# Patient Record
Sex: Female | Born: 1935 | Race: White | Hispanic: No | Smoking: Never smoker
Health system: Southern US, Community
[De-identification: ages and names within clinical notes are randomized; demographics above are authoritative.]

## PROBLEM LIST (undated history)

## (undated) DIAGNOSIS — I4891 Unspecified atrial fibrillation: Secondary | ICD-10-CM

## (undated) DIAGNOSIS — I10 Essential (primary) hypertension: Secondary | ICD-10-CM

## (undated) DIAGNOSIS — F32A Depression, unspecified: Secondary | ICD-10-CM

## (undated) DIAGNOSIS — E039 Hypothyroidism, unspecified: Secondary | ICD-10-CM

## (undated) DIAGNOSIS — E785 Hyperlipidemia, unspecified: Secondary | ICD-10-CM

## (undated) DIAGNOSIS — I509 Heart failure, unspecified: Secondary | ICD-10-CM

## (undated) DIAGNOSIS — F329 Major depressive disorder, single episode, unspecified: Secondary | ICD-10-CM

## (undated) DIAGNOSIS — M1990 Unspecified osteoarthritis, unspecified site: Secondary | ICD-10-CM

## (undated) DIAGNOSIS — H539 Unspecified visual disturbance: Secondary | ICD-10-CM

## (undated) DIAGNOSIS — M545 Low back pain, unspecified: Secondary | ICD-10-CM

## (undated) DIAGNOSIS — B029 Zoster without complications: Secondary | ICD-10-CM

## (undated) DIAGNOSIS — H409 Unspecified glaucoma: Secondary | ICD-10-CM

## (undated) DIAGNOSIS — H3530 Unspecified macular degeneration: Secondary | ICD-10-CM

## (undated) HISTORY — PX: CATARACT EXTRACTION BILATERAL W/ ANTERIOR VITRECTOMY: SHX1304

## (undated) HISTORY — PX: HX TONSILLECTOMY: SHX27

## (undated) HISTORY — PX: HIP ARTHROPLASTY: SHX981

## (undated) HISTORY — PX: HX APPENDECTOMY: SHX54

## (undated) HISTORY — PX: HX TUBAL LIGATION: SHX77

## (undated) HISTORY — PX: HIP SURGERY: SHX245

## (undated) HISTORY — DX: Low back pain, unspecified: M54.50

## (undated) HISTORY — DX: Unspecified visual disturbance: H53.9

## (undated) HISTORY — DX: Hyperlipidemia, unspecified: E78.5

## (undated) HISTORY — DX: Unspecified atrial fibrillation: I48.91

## (undated) HISTORY — DX: Zoster without complications: B02.9

## (undated) HISTORY — PX: TONSILLECTOMY: SUR1361

## (undated) HISTORY — DX: Essential (primary) hypertension: I10

## (undated) HISTORY — PX: APPENDECTOMY (OPEN): SHX54

## (undated) HISTORY — DX: Unspecified macular degeneration: H35.30

## (undated) HISTORY — DX: Unspecified osteoarthritis, unspecified site: M19.90

## (undated) HISTORY — DX: Heart failure, unspecified: I50.9

## (undated) HISTORY — PX: EYE SURGERY: SHX253

## (undated) HISTORY — DX: Unspecified glaucoma: H40.9

## (undated) HISTORY — PX: TUBAL LIGATION: SHX77

---
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
## 1997-11-28 ENCOUNTER — Ambulatory Visit: Admit: 1997-11-28 | Disposition: A | Discharge status: Home / Self Care | Payer: Self-pay | Source: Ambulatory Visit

## 1998-03-04 ENCOUNTER — Ambulatory Visit: Admit: 1998-03-04 | Disposition: A | Discharge status: Home / Self Care | Payer: Self-pay | Source: Ambulatory Visit

## 1998-07-31 ENCOUNTER — Ambulatory Visit
Admission: RE | Admit: 1998-07-31 | Disposition: A | Discharge status: Home / Self Care | Payer: Self-pay | Source: Ambulatory Visit

## 1998-08-12 ENCOUNTER — Ambulatory Visit: Admit: 1998-08-12 | Disposition: A | Discharge status: Home / Self Care | Payer: Self-pay | Source: Ambulatory Visit

## 1998-08-21 ENCOUNTER — Ambulatory Visit
Admission: RE | Admit: 1998-08-21 | Disposition: A | Discharge status: Home / Self Care | Payer: Self-pay | Source: Ambulatory Visit

## 1998-11-29 ENCOUNTER — Ambulatory Visit: Admit: 1998-11-29 | Disposition: A | Discharge status: Home / Self Care | Payer: Self-pay | Source: Ambulatory Visit

## 1998-12-20 ENCOUNTER — Ambulatory Visit: Admit: 1998-12-20 | Disposition: A | Discharge status: Home / Self Care | Payer: Self-pay | Source: Ambulatory Visit

## 1999-03-21 ENCOUNTER — Ambulatory Visit
Admission: RE | Admit: 1999-03-21 | Disposition: A | Discharge status: Home / Self Care | Payer: Self-pay | Source: Ambulatory Visit

## 1999-03-24 ENCOUNTER — Ambulatory Visit
Admission: RE | Admit: 1999-03-24 | Disposition: A | Discharge status: Home / Self Care | Payer: Self-pay | Source: Ambulatory Visit

## 1999-07-02 ENCOUNTER — Ambulatory Visit
Admission: RE | Admit: 1999-07-02 | Disposition: A | Discharge status: Home / Self Care | Payer: Self-pay | Source: Ambulatory Visit

## 2000-02-17 ENCOUNTER — Ambulatory Visit: Admit: 2000-02-17 | Disposition: A | Discharge status: Home / Self Care | Payer: Self-pay | Source: Ambulatory Visit

## 2000-03-02 ENCOUNTER — Ambulatory Visit: Admit: 2000-03-02 | Disposition: A | Discharge status: Home / Self Care | Payer: Self-pay | Source: Ambulatory Visit

## 2000-05-11 ENCOUNTER — Ambulatory Visit
Admission: RE | Admit: 2000-05-11 | Disposition: A | Discharge status: Home / Self Care | Payer: Self-pay | Source: Ambulatory Visit

## 2000-12-31 ENCOUNTER — Ambulatory Visit
Admission: RE | Admit: 2000-12-31 | Disposition: A | Discharge status: Home / Self Care | Payer: Self-pay | Source: Ambulatory Visit

## 2001-03-24 ENCOUNTER — Ambulatory Visit
Admission: RE | Admit: 2001-03-24 | Disposition: A | Discharge status: Home / Self Care | Payer: Self-pay | Source: Ambulatory Visit

## 2001-05-10 ENCOUNTER — Ambulatory Visit
Admission: RE | Admit: 2001-05-10 | Disposition: A | Discharge status: Home / Self Care | Payer: Self-pay | Source: Ambulatory Visit

## 2002-07-03 ENCOUNTER — Ambulatory Visit
Admission: RE | Admit: 2002-07-03 | Disposition: A | Discharge status: Home / Self Care | Payer: Self-pay | Source: Ambulatory Visit

## 2002-08-03 ENCOUNTER — Ambulatory Visit
Admission: RE | Admit: 2002-08-03 | Disposition: A | Discharge status: Home / Self Care | Payer: Self-pay | Source: Ambulatory Visit

## 2002-08-07 ENCOUNTER — Ambulatory Visit: Admit: 2002-08-07 | Disposition: A | Discharge status: Home / Self Care | Payer: Self-pay | Source: Ambulatory Visit

## 2002-08-07 ENCOUNTER — Ambulatory Visit
Admission: RE | Admit: 2002-08-07 | Disposition: A | Discharge status: Home / Self Care | Payer: Self-pay | Source: Ambulatory Visit

## 2003-04-20 ENCOUNTER — Emergency Department
Admission: EM | Admit: 2003-04-20 | Disposition: A | Discharge status: Home / Self Care | Payer: Self-pay | Source: Ambulatory Visit

## 2003-07-06 ENCOUNTER — Ambulatory Visit
Admission: RE | Admit: 2003-07-06 | Disposition: A | Discharge status: Home / Self Care | Payer: Self-pay | Source: Ambulatory Visit

## 2004-06-20 ENCOUNTER — Ambulatory Visit: Admit: 2004-06-20 | Disposition: A | Discharge status: Home / Self Care | Payer: Self-pay | Source: Ambulatory Visit

## 2004-09-04 ENCOUNTER — Emergency Department
Admission: EM | Admit: 2004-09-04 | Disposition: A | Discharge status: Home / Self Care | Payer: Self-pay | Source: Ambulatory Visit

## 2004-10-10 ENCOUNTER — Ambulatory Visit
Admission: RE | Admit: 2004-10-10 | Disposition: A | Discharge status: Home / Self Care | Payer: Self-pay | Source: Ambulatory Visit

## 2004-12-17 ENCOUNTER — Ambulatory Visit
Admission: RE | Admit: 2004-12-17 | Disposition: A | Discharge status: Home / Self Care | Payer: Self-pay | Source: Ambulatory Visit

## 2005-02-16 ENCOUNTER — Emergency Department
Admission: EM | Admit: 2005-02-16 | Disposition: A | Discharge status: Home / Self Care | Payer: Self-pay | Source: Ambulatory Visit

## 2005-02-25 ENCOUNTER — Ambulatory Visit
Admission: RE | Admit: 2005-02-25 | Disposition: A | Discharge status: Home / Self Care | Payer: Self-pay | Source: Ambulatory Visit

## 2005-05-16 ENCOUNTER — Ambulatory Visit
Admission: RE | Admit: 2005-05-16 | Disposition: A | Discharge status: Home / Self Care | Payer: Self-pay | Source: Ambulatory Visit

## 2005-09-22 ENCOUNTER — Ambulatory Visit
Admission: RE | Admit: 2005-09-22 | Disposition: A | Discharge status: Home / Self Care | Payer: Self-pay | Source: Ambulatory Visit

## 2005-11-09 ENCOUNTER — Ambulatory Visit
Admission: RE | Admit: 2005-11-09 | Disposition: A | Discharge status: Home / Self Care | Payer: Self-pay | Source: Ambulatory Visit

## 2006-02-23 ENCOUNTER — Ambulatory Visit
Admission: RE | Admit: 2006-02-23 | Disposition: A | Discharge status: Home / Self Care | Payer: Self-pay | Source: Ambulatory Visit

## 2006-12-06 ENCOUNTER — Ambulatory Visit
Admission: RE | Admit: 2006-12-06 | Disposition: A | Discharge status: Home / Self Care | Payer: Self-pay | Source: Ambulatory Visit

## 2007-04-06 ENCOUNTER — Ambulatory Visit
Admission: RE | Admit: 2007-04-06 | Disposition: A | Discharge status: Home / Self Care | Payer: Self-pay | Source: Ambulatory Visit

## 2007-04-25 ENCOUNTER — Ambulatory Visit
Admission: RE | Admit: 2007-04-25 | Disposition: A | Discharge status: Home / Self Care | Payer: Self-pay | Source: Ambulatory Visit

## 2007-05-23 ENCOUNTER — Ambulatory Visit
Admission: RE | Admit: 2007-05-23 | Disposition: A | Discharge status: Home / Self Care | Payer: Self-pay | Source: Ambulatory Visit

## 2007-05-24 ENCOUNTER — Ambulatory Visit
Admission: RE | Admit: 2007-05-24 | Disposition: A | Discharge status: Home / Self Care | Payer: Self-pay | Source: Ambulatory Visit

## 2007-06-24 ENCOUNTER — Ambulatory Visit
Admission: RE | Admit: 2007-06-24 | Disposition: A | Discharge status: Home / Self Care | Payer: Self-pay | Source: Ambulatory Visit

## 2007-07-14 ENCOUNTER — Ambulatory Visit (INDEPENDENT_AMBULATORY_CARE_PROVIDER_SITE_OTHER): Admit: 2007-07-14 | Disposition: A | Discharge status: Home / Self Care | Payer: Self-pay | Source: Ambulatory Visit

## 2007-07-14 ENCOUNTER — Ambulatory Visit: Admit: 2007-07-14 | Disposition: A | Discharge status: Home / Self Care | Payer: Self-pay | Source: Ambulatory Visit

## 2008-01-26 ENCOUNTER — Ambulatory Visit
Admission: RE | Admit: 2008-01-26 | Disposition: A | Discharge status: Home / Self Care | Payer: Self-pay | Source: Ambulatory Visit

## 2008-07-20 ENCOUNTER — Ambulatory Visit
Admission: RE | Admit: 2008-07-20 | Disposition: A | Discharge status: Home / Self Care | Payer: Self-pay | Source: Ambulatory Visit

## 2008-08-17 ENCOUNTER — Ambulatory Visit
Admission: RE | Admit: 2008-08-17 | Disposition: A | Discharge status: Home / Self Care | Payer: Self-pay | Source: Ambulatory Visit

## 2008-09-19 ENCOUNTER — Ambulatory Visit
Admission: RE | Admit: 2008-09-19 | Disposition: A | Discharge status: Home / Self Care | Payer: Self-pay | Source: Ambulatory Visit

## 2008-10-26 ENCOUNTER — Ambulatory Visit
Admission: RE | Admit: 2008-10-26 | Disposition: A | Discharge status: Home / Self Care | Payer: Self-pay | Source: Ambulatory Visit

## 2008-11-23 DIAGNOSIS — S72001A Fracture of unspecified part of neck of right femur, initial encounter for closed fracture: Secondary | ICD-10-CM

## 2008-11-23 HISTORY — DX: Fracture of unspecified part of neck of right femur, initial encounter for closed fracture: S72.001A

## 2009-01-20 ENCOUNTER — Ambulatory Visit
Admission: RE | Admit: 2009-01-20 | Disposition: A | Discharge status: Home / Self Care | Payer: Self-pay | Source: Ambulatory Visit

## 2009-01-31 ENCOUNTER — Ambulatory Visit
Admission: RE | Admit: 2009-01-31 | Disposition: A | Discharge status: Home / Self Care | Payer: Self-pay | Source: Ambulatory Visit

## 2009-02-21 ENCOUNTER — Ambulatory Visit
Admission: RE | Admit: 2009-02-21 | Disposition: A | Discharge status: Home / Self Care | Payer: Self-pay | Source: Ambulatory Visit

## 2009-03-31 ENCOUNTER — Inpatient Hospital Stay
Admission: EM | Admit: 2009-03-31 | Disposition: A | Discharge status: Skilled Nursing Facility | Payer: Self-pay | Source: Ambulatory Visit | Admitting: Orthopaedic Surgery

## 2009-06-18 ENCOUNTER — Ambulatory Visit
Admission: RE | Admit: 2009-06-18 | Disposition: A | Discharge status: Home / Self Care | Payer: Self-pay | Source: Ambulatory Visit

## 2009-06-24 ENCOUNTER — Ambulatory Visit
Admission: RE | Admit: 2009-06-24 | Disposition: A | Discharge status: Home / Self Care | Payer: Self-pay | Source: Ambulatory Visit

## 2009-07-24 ENCOUNTER — Ambulatory Visit
Admission: RE | Admit: 2009-07-24 | Disposition: A | Discharge status: Home / Self Care | Payer: Self-pay | Source: Ambulatory Visit

## 2009-10-24 ENCOUNTER — Ambulatory Visit
Admission: RE | Admit: 2009-10-24 | Disposition: A | Discharge status: Home / Self Care | Payer: Self-pay | Source: Ambulatory Visit

## 2009-11-12 ENCOUNTER — Ambulatory Visit
Admission: RE | Admit: 2009-11-12 | Disposition: A | Discharge status: Home / Self Care | Payer: Self-pay | Source: Ambulatory Visit

## 2009-12-05 ENCOUNTER — Ambulatory Visit
Admission: RE | Admit: 2009-12-05 | Disposition: A | Discharge status: Home / Self Care | Payer: Self-pay | Source: Ambulatory Visit

## 2009-12-24 ENCOUNTER — Ambulatory Visit
Admission: RE | Admit: 2009-12-24 | Disposition: A | Discharge status: Home / Self Care | Payer: Self-pay | Source: Ambulatory Visit

## 2010-03-13 ENCOUNTER — Ambulatory Visit
Admission: RE | Admit: 2010-03-13 | Disposition: A | Discharge status: Home / Self Care | Payer: Self-pay | Source: Ambulatory Visit

## 2011-03-19 ENCOUNTER — Ambulatory Visit
Admission: RE | Admit: 2011-03-19 | Disposition: A | Discharge status: Home / Self Care | Payer: Self-pay | Source: Ambulatory Visit

## 2011-05-11 ENCOUNTER — Ambulatory Visit
Admission: RE | Admit: 2011-05-11 | Disposition: A | Discharge status: Home / Self Care | Payer: Self-pay | Source: Ambulatory Visit

## 2012-04-05 ENCOUNTER — Ambulatory Visit
Admission: RE | Admit: 2012-04-05 | Disposition: A | Discharge status: Home / Self Care | Payer: Self-pay | Source: Ambulatory Visit

## 2012-09-20 ENCOUNTER — Emergency Department
Admission: EM | Admit: 2012-09-20 | Disposition: A | Discharge status: Home / Self Care | Payer: Self-pay | Source: Ambulatory Visit

## 2012-10-25 ENCOUNTER — Ambulatory Visit
Admission: RE | Admit: 2012-10-25 | Disposition: A | Discharge status: Home / Self Care | Payer: Self-pay | Source: Ambulatory Visit

## 2012-10-26 ENCOUNTER — Ambulatory Visit
Admission: RE | Admit: 2012-10-26 | Disposition: A | Discharge status: Home / Self Care | Payer: Self-pay | Source: Ambulatory Visit

## 2012-10-27 ENCOUNTER — Ambulatory Visit
Admission: RE | Admit: 2012-10-27 | Disposition: A | Discharge status: Home / Self Care | Payer: Self-pay | Source: Ambulatory Visit

## 2012-11-15 ENCOUNTER — Emergency Department
Admission: EM | Admit: 2012-11-15 | Disposition: A | Discharge status: Home / Self Care | Payer: Self-pay | Source: Ambulatory Visit

## 2012-12-28 LAB — CBC AND DIFFERENTIAL
Basophils %: 0.6 % (ref 0.0–3.0)
Basophils Absolute: 0 10*3/uL (ref 0.0–0.3)
Eosinophils %: 1.5 % (ref 0.0–7.0)
Eosinophils Absolute: 0.1 10*3/uL (ref 0.0–0.8)
Hematocrit: 36.4 % (ref 36.0–48.0)
Hemoglobin: 12 gm/dL (ref 12.0–16.0)
Lymphocytes Absolute: 1.7 10*3/uL (ref 0.6–5.1)
Lymphocytes: 24.2 % (ref 15.0–46.0)
MCH: 32 pg (ref 28–35)
MCHC: 33 gm/dL (ref 32–36)
MCV: 97 fL (ref 80–100)
MPV: 7.4 fL (ref 6.0–10.0)
Monocytes Absolute: 0.7 10*3/uL (ref 0.1–1.7)
Monocytes: 9.9 % (ref 3.0–15.0)
Neutrophils %: 63.9 % (ref 42.0–78.0)
Neutrophils Absolute: 4.4 10*3/uL (ref 1.7–8.6)
PLT CT: 192 10*3/uL (ref 130–440)
RBC: 3.75 10*6/uL — ABNORMAL LOW (ref 3.80–5.00)
RDW: 16.1 % — ABNORMAL HIGH (ref 11.0–14.0)
WBC: 6.9 10*3/uL (ref 4.0–11.0)

## 2012-12-28 LAB — I-STAT CHEM 8 CARTRIDGE
Anion Gap I-Stat: 16 (ref 7–16)
BUN I-Stat: 22 mg/dL — ABNORMAL HIGH (ref 6–20)
Calcium Ionized I-Stat: 5.2 mg/dL — ABNORMAL HIGH (ref 4.35–5.10)
Chloride I-Stat: 102 mMol/L (ref 98–112)
Creatinine I-Stat: 1.3 mg/dL — ABNORMAL HIGH (ref 0.60–1.10)
EGFR: 38 mL/min/{1.73_m2}
Glucose I-Stat: 96 mg/dL (ref 70–99)
Hematocrit I-Stat: 32 % — ABNORMAL LOW (ref 36.0–48.0)
Hemoglobin I-Stat: 10.9 gm/dL — ABNORMAL LOW (ref 12.0–16.0)
Potassium I-Stat: 3.9 mMol/L (ref 3.5–5.3)
Sodium I-Stat: 141 mMol/L (ref 135–145)
TCO2 I-Stat: 28 mMol/L (ref 24–29)

## 2012-12-28 LAB — MAGNESIUM: Magnesium: 2.1 mg/dL (ref 1.6–2.6)

## 2012-12-30 LAB — CBC AND DIFFERENTIAL
Basophils %: 0.2 % (ref 0.0–3.0)
Basophils Absolute: 0 10*3/uL (ref 0.0–0.3)
Eosinophils %: 1.8 % (ref 0.0–7.0)
Eosinophils Absolute: 0.1 10*3/uL (ref 0.0–0.8)
Hematocrit: 36.4 % (ref 36.0–48.0)
Hemoglobin: 12.4 gm/dL (ref 12.0–16.0)
Lymphocytes Absolute: 1.1 10*3/uL (ref 0.6–5.1)
Lymphocytes: 20.5 % (ref 15.0–46.0)
MCH: 33 pg (ref 28–35)
MCHC: 34 gm/dL (ref 32–36)
MCV: 97 fL (ref 80–100)
MPV: 7.2 fL (ref 6.0–10.0)
Monocytes Absolute: 0.6 10*3/uL (ref 0.1–1.7)
Monocytes: 10.5 % (ref 3.0–15.0)
Neutrophils %: 67 % (ref 42.0–78.0)
Neutrophils Absolute: 3.5 10*3/uL (ref 1.7–8.6)
PLT CT: 194 10*3/uL (ref 130–440)
RBC: 3.77 10*6/uL — ABNORMAL LOW (ref 3.80–5.00)
RDW: 16.1 % — ABNORMAL HIGH (ref 11.0–14.0)
WBC: 5.3 10*3/uL (ref 4.0–11.0)

## 2012-12-30 LAB — BASIC METABOLIC PANEL
Anion Gap: 11.1 mMol/L (ref 7.0–18.0)
BUN / Creatinine Ratio: 16.2 Ratio (ref 10.0–30.0)
BUN: 17 mg/dL (ref 7–22)
CO2: 30.5 mMol/L — ABNORMAL HIGH (ref 20.0–30.0)
Calcium: 10.2 mg/dL (ref 8.5–10.5)
Chloride: 103 mMol/L (ref 98–110)
Creatinine: 1.05 mg/dL (ref 0.60–1.20)
EGFR: 49 mL/min/{1.73_m2}
Glucose: 128 mg/dL — ABNORMAL HIGH (ref 70–99)
Osmolality Calc: 284 mOsm/kg (ref 275–300)
Potassium: 3.6 mMol/L (ref 3.5–5.3)
Sodium: 141 mMol/L (ref 136–147)

## 2013-02-14 ENCOUNTER — Ambulatory Visit
Admit: 2013-02-14 | Disposition: A | Discharge status: Home / Self Care | Payer: Self-pay | Source: Ambulatory Visit | Admitting: Orthopaedic Surgery

## 2013-04-04 LAB — ECG 12-LEAD
P Wave Axis: 61 deg
P Wave Axis: 62 deg
P Wave Duration: 102 ms
P Wave Duration: 106 ms
P-R Interval: 152 ms
P-R Interval: 158 ms
Patient Age: 89 years
Patient Age: 89 years
Q-T Dispersion: 52 ms
Q-T Dispersion: 56 ms
Q-T Interval(Corrected): 407 ms
Q-T Interval(Corrected): 418 ms
Q-T Interval: 338 ms
Q-T Interval: 338 ms
QRS Axis: 25 deg
QRS Axis: 31 deg
QRS Duration: 76 ms
QRS Duration: 78 ms
T Axis: 48 deg
T Axis: 51 deg
Ventricular Rate: 87 /min
Ventricular Rate: 92 /min

## 2013-04-15 NOTE — Procedures (Signed)
[facility]                                 RESPIRATORY SERVICES       PULMONARY FUNCTION REPORT              NAME: Kelli Brown, Kelli Brown                                  ROOM#: 1/--WD       DOB:  21-Jun-1919                                            AGE/SEX: 89/F       MR#: [id number]       ACCT#: [id number]                                                 INT PHYS:       TEST DATE: 10/26/2012                        REQ PHYS: Vaughan Basta, MD                                                                                              ORDER#: [id number]                                            WEIGHT: 159.00       HGT: 60.00                                                RACE: Caucasian       TECH: Jack Quarto                               SMOKING HX: Never smoked       _________________________________________________________________________              DATE OF PROCEDURE:       10/26/2012              REQUESTING PHYSICIAN:       Vaughan Basta, MD              HEIGHT:       60.00              WEIGHT:       159.00  TECHNICIAN:       Sonya Liedke              RACE:       Caucasian              SMOKING HX:       Never smoked              DIAGNOSIS:       SOB, R/O PE.              Forced vital capacity of 2.04 L, 127% of predicted.  The FEV1 is 1.54 L,       133% of predicted.  The FEV1% is 75.  The FEF 25-85 1.14 L/sec which is       199% of predicted.  After the application of inhaled bronchodilators,       there is statistically significant improvement in the FEF 25-75 alone.              Lung volumes reveal elevated total lung capacity of 94% of predicted.       Residual volume 88% of predicted.  Diffusion capacity is 60% of       predicted.  When corrected for alveolar volume does improve to 79% of       predicted.  Airway resistance is within normal limits.  Flow volume loop       shows only a minimal amount of obstructed change.              ASSESSMENT:        The patient's spirometry is normal.  There is improvement in the FEF       25-75 which would be consistent with a component of reversible small       airways obstruction.  The lung volumes are normal.  The diffusion       capacity is markedly reduced.  This reduction is out of proportion to       the above noted spirometric changes.  Need to consider the possibility       of pulmonary vascular disease, pulmonary embolic disease, chemically       induced diffusion abnormalities and other.  No previous pulmonary              function studies available for comparison at this time.                                            **PRELIMINARY REPORT UNLESS SIGNED**                                        SEE DOCUMENT IMAGING SYSTEM FOR FINAL REPORT                                                                        ________________________________  Fayrene Helper, MD                                                                                                                         ID:   [id number]                                                                  DocType:   [facility]       \:   JC                                                                         /:   [id number]       DD:  11/01/2012 13:02:17                                                        DT:  11/02/2012 08:09:35                                                        JOB: [id number]                                                                    CC:  Reginia Naas, MD([id number])            EDWARD P SMITH ([id number])                   >                                                                          Authenticated by Fayrene Helper., MD On 11/11/2012 10:57:51 AM

## 2013-04-16 NOTE — Procedures (Signed)
[facility]                               CARDIOVASCULAR LABORATORY                                 ECHOCARDIOGRAM REPORT       Echocardiogram.              NAME:Kelli Brown, Kelli Brown                                    ROOM#:E/--IS              DOB:25-Jan-1919                                              AGE/SEX: 89/F       ZO#:[id number]       [id number]       _________________________________________________________________________              TEST DATE: 09/20/2012                  REQ PHYS: Edrick Kins, MD                                                                                              ORDER#:                                   INT PHYS: Celene Kras, MD       HGT: 61 inches                                                WEIGHT: 158       TECH: --                                          BP: 134/66                                                                                         _________________________________________________________________________       DATE OF PROCEDURE:       09/20/2012  REQUESTING PHYSICIAN:       Edrick Kins, MD              INT PHYSICIAN:       Celene Kras, MD              HEIGHT:       61 inches              WEIGHT:       158              BLOOD PRESSURE:       134/66              TECHNICIAN:       --              TYPE OF REPORT:       Echocardiogram.              TIME OF TEST:       2:17 p.m.              INDICATION:       Shortness of breath.              INTERPRETATION:       2-D and M-mode measurements were performed and reveal the following:              RVID (ed):  --  (2.1-3.2) LVPW (es):   1.2  (0.9-2.1)        IVS (ed):   1.1 (0.7-1.1) Aorta (ed):  2.8  (2.2-3.7)        LVID (ed):  3.7 (4.0-5.6) LVOT:        --   (2.0-4.0)        LVPW (ed):  1.0 (0.7-1.1) LA (es):     3.5  (2.5-4.0)        IVS (es):   1.5 (0.8-2.0) EF:          57.7 (55-77%)                LVID (es):  2.6  (2.0-3.8) EPSS:        --   (2-7 mm)                       DOPPLER/COLOR FLOW:       MITRAL VALVE:  ACTUAL NORMAL    AORTIC VALVE    ACTUAL NORMAL           V Max M/sec:   --     (0.6-1.3) V Max M/sec:    --     (1.0-1.7)        T 1/2 M/sec:   --     (30-60)   LVOT Vel:       --     (0.7-1.1)        Valve Area:    --     (4-6 cm)  Valve area:     --     (3-5 cm)         Regurg:        --               Regurg:         --                      PISA:          --  Pres 1/2 Time:  --                      ERO:           --               Ao Max PG:      4.8                     RV:            --               Ao Mean PG:     --                                    TRICUSPID VALVE ACTUAL NORMAL    PULMONIC VALVE ACTUAL NORMAL           V Max M/sec:    --     (0.3-0.7) V Max M/sec:   --     (0.6-0.9)        Regurg:         --               Regurg         --                      RV Systolic:    --     (15-30)                                          Pressure:       --                                                                                  Technical quality is good.  Rhythm is normal sinus.              2D/M-mode:              1.  LV:  LV is normal in size and function.  Ejection fraction of           60% to 65% with no regional wall motion abnormalities.       2.  Left atrium:  Normal size.       3.  Right ventricle:  Normal size and systolic function.       4.  Right atrium:  Normal size.       5.  Aortic root:  Normal size.       6.  Pericardium:  Trivial posterior pericardial effusion noted.              Valvular assessment/Doppler:              1.  Aortic valve:  Trileaflet, moderately sclerotic with trace AI           and normal outflow velocities.       2.  Mitral  valve:  Mild mitral calcification and mild leaflet           thickening with trace MR.  There is E to A reversal present           consistent with LV relaxation abnormality.       3.  Tricuspid valve:  Morphologically normal with mild TR.   Jet           velocity of 2.6 m/sec for an estimated PASP of 38 mmHg using an RA           pressure of 10 mmHg.       4.  Pulmonic valve:  No PI.              IMPRESSION:       1.  Normal left ventricular size and systolic function with           ejection fraction of 60% to 65% with no regional wall motion           abnormalities.       2.  Aortic sclerosis without stenosis with trace AI.       3.  Mild mitral annular calcification leaflet thickening with           trace MR.       4.  PA reversal present consistent with LV relaxation abnormality.                                            **PRELIMINARY REPORT UNLESS SIGNED**                                        SEE DOCUMENT IMAGING SYSTEM FOR FINAL REPORT                                                                               ________________________________                                                Celene Kras, MD                                                                                                                       ID:   [id number]  DocType:   01TRVC       \:   MH                                                                         /:   313       DD:  09/20/2012 19:27:25                                                        DT:  09/21/2012 03:51:28                                                        JOB: [id number]                                                                    CC:  EDWARD P SMITH ([id number])            DR NO REFERRAL ([id number])                   >                                                                          Authenticated by Celene Kras, MD On 09/21/2012 08:57:37 AM

## 2013-04-16 NOTE — Procedures (Signed)
[facility]                               CARDIOVASCULAR LABORATORY       Event monitor.              NAME: Kelli Brown, Kelli Brown                                  ROOM#: O/--CP              DOB:  1919/10/28                                            AGE/SEX: 89/F       ZO#:[id number]       [id number]       _________________________________________________________________________              TEST DATE: 10/27/2012 to 11/27/2012          REQ PHYS: Vaughan Basta, MD                                                                                              ORDER#: [id number]                              INT PHYS: Orland Jarred, MD       HGT: --                                                        WEIGHT: --       TECH: ______                                BP: --                                                                                         _________________________________________________________________________              DATE OF PROCEDURE:       10/27/2012 to 11/27/2012              REQUESTING PHYSICIAN:       Vaughan Basta, MD              INT PHYSICIAN:  Tressa Maldonado Tery Sanfilippo, MD              HEIGHT:       --              WEIGHT:       --              BLOOD PRESSURE:       --              TECHNICIAN:       ______              TYPE OF REPORT:       Event monitor.              TIME OF TEST:       --              INDICATION:       --              INTERPRETATION:       A total of 12 events were transmitted during the period.  Heart rates       ranged from 88 beats to 122 beats per minute.  All of them showed sinus       rhythm with occasional artifacts.  Patient had episode of shortness of       breath and feeling tired one time on October 27, 2012 and the       transmission at that time was sinus rhythm with a heart rate of 87 to       101 beats per minute.  There are no dysrhythmias.                     IMPRESSION:       Unremarkable event monitor recording.   No arrhythmias during the       patient's symptoms of shortness of breath which happened one time.                                            **PRELIMINARY REPORT UNLESS SIGNED**                                        SEE DOCUMENT IMAGING SYSTEM FOR FINAL REPORT                                                                        ________________________________                                                Orland Jarred, MD  ID:   [id number]                                                                  DocType:   01TRVC       \:   VH                                                                         /:   [id number]       DD:  12/01/2012 15:00:50                                                        DT:  12/01/2012 15:24:35                                                        JOB: [id number]                                                                    CC:  Reginia Naas, MD([id number])            EDWARD P SMITH ([id number])            DR NO REFERRAL (1000)                   >                                                                          Authenticated by Herbert Seta ,MD On 12/01/2012 03:42:52 PM

## 2013-05-08 ENCOUNTER — Other Ambulatory Visit: Payer: Self-pay | Admitting: Physician Assistant

## 2013-05-08 ENCOUNTER — Ambulatory Visit
Admission: RE | Admit: 2013-05-08 | Discharge: 2013-05-08 | Disposition: A | Discharge status: Home / Self Care | Payer: Medicare Other | Source: Ambulatory Visit | Attending: Physician Assistant | Admitting: Physician Assistant

## 2013-05-08 DIAGNOSIS — M25579 Pain in unspecified ankle and joints of unspecified foot: Secondary | ICD-10-CM | POA: Insufficient documentation

## 2013-05-17 ENCOUNTER — Other Ambulatory Visit: Payer: Self-pay | Admitting: Physician Assistant

## 2013-05-19 ENCOUNTER — Ambulatory Visit
Admission: RE | Admit: 2013-05-19 | Discharge: 2013-05-19 | Disposition: A | Discharge status: Home / Self Care | Payer: Medicare Other | Source: Ambulatory Visit | Attending: Physician Assistant | Admitting: Physician Assistant

## 2013-05-19 ENCOUNTER — Other Ambulatory Visit: Payer: Self-pay | Admitting: Physician Assistant

## 2013-05-19 DIAGNOSIS — L02419 Cutaneous abscess of limb, unspecified: Secondary | ICD-10-CM | POA: Insufficient documentation

## 2013-05-19 DIAGNOSIS — M25572 Pain in left ankle and joints of left foot: Secondary | ICD-10-CM

## 2013-05-19 DIAGNOSIS — M65979 Unspecified synovitis and tenosynovitis, unspecified ankle and foot: Secondary | ICD-10-CM | POA: Insufficient documentation

## 2013-05-19 DIAGNOSIS — M659 Synovitis and tenosynovitis, unspecified: Secondary | ICD-10-CM | POA: Insufficient documentation

## 2013-05-29 ENCOUNTER — Ambulatory Visit
Admission: RE | Admit: 2013-05-29 | Discharge: 2013-05-29 | Disposition: A | Discharge status: Home / Self Care | Payer: Medicare Other | Source: Ambulatory Visit | Attending: Internal Medicine | Admitting: Internal Medicine

## 2013-05-29 DIAGNOSIS — L0390 Cellulitis, unspecified: Secondary | ICD-10-CM | POA: Insufficient documentation

## 2013-05-29 DIAGNOSIS — M25579 Pain in unspecified ankle and joints of unspecified foot: Secondary | ICD-10-CM | POA: Insufficient documentation

## 2013-05-29 DIAGNOSIS — L0291 Cutaneous abscess, unspecified: Secondary | ICD-10-CM | POA: Insufficient documentation

## 2013-05-29 LAB — CBC AND DIFFERENTIAL
Basophils %: 0.2 % (ref 0.0–3.0)
Basophils Absolute: 0 10*3/uL (ref 0.0–0.3)
Eosinophils %: 2 % (ref 0.0–7.0)
Eosinophils Absolute: 0.1 10*3/uL (ref 0.0–0.8)
Hematocrit: 35.1 % — ABNORMAL LOW (ref 36.0–48.0)
Hemoglobin: 12.3 gm/dL (ref 12.0–16.0)
Lymphocytes Absolute: 1.9 10*3/uL (ref 0.6–5.1)
Lymphocytes: 28.9 % (ref 15.0–46.0)
MCH: 33 pg (ref 28–35)
MCHC: 35 gm/dL (ref 32–36)
MCV: 95 fL (ref 80–100)
MPV: 7.1 fL (ref 6.0–10.0)
Monocytes Absolute: 0.7 10*3/uL (ref 0.1–1.7)
Monocytes: 10.8 % (ref 3.0–15.0)
Neutrophils %: 58.1 % (ref 42.0–78.0)
Neutrophils Absolute: 3.9 10*3/uL (ref 1.7–8.6)
PLT CT: 215 10*3/uL (ref 130–440)
RBC: 3.7 10*6/uL — ABNORMAL LOW (ref 3.80–5.00)
RDW: 16.6 % — ABNORMAL HIGH (ref 11.0–14.0)
WBC: 6.7 10*3/uL (ref 4.0–11.0)

## 2013-12-29 LAB — ECG 12-LEAD
P Wave Axis: 66 deg
P Wave Duration: 88 ms
P-R Interval: 174 ms
Patient Age: 89 years
Q-T Dispersion: 34 ms
Q-T Interval(Corrected): 417 ms
Q-T Interval: 370 ms
QRS Axis: 32 deg
QRS Duration: 78 ms
T Axis: 38 deg
Ventricular Rate: 87 /min

## 2014-10-23 ENCOUNTER — Inpatient Hospital Stay: Payer: Medicare Other | Admitting: Neurological Surgery

## 2014-10-23 ENCOUNTER — Inpatient Hospital Stay
Admission: EM | Admit: 2014-10-23 | Discharge: 2014-10-26 | DRG: 552 | Disposition: A | Discharge status: Skilled Nursing Facility | Payer: Medicare Other | Attending: Neurological Surgery | Admitting: Neurological Surgery

## 2014-10-23 ENCOUNTER — Emergency Department: Payer: Medicare Other

## 2014-10-23 DIAGNOSIS — S12000A Unspecified displaced fracture of first cervical vertebra, initial encounter for closed fracture: Principal | ICD-10-CM | POA: Diagnosis present

## 2014-10-23 DIAGNOSIS — H3530 Unspecified macular degeneration: Secondary | ICD-10-CM | POA: Diagnosis present

## 2014-10-23 DIAGNOSIS — Y9389 Activity, other specified: Secondary | ICD-10-CM

## 2014-10-23 DIAGNOSIS — S12100A Unspecified displaced fracture of second cervical vertebra, initial encounter for closed fracture: Secondary | ICD-10-CM | POA: Diagnosis present

## 2014-10-23 DIAGNOSIS — Y998 Other external cause status: Secondary | ICD-10-CM

## 2014-10-23 DIAGNOSIS — S12091A Other nondisplaced fracture of first cervical vertebra, initial encounter for closed fracture: Secondary | ICD-10-CM

## 2014-10-23 DIAGNOSIS — Z9049 Acquired absence of other specified parts of digestive tract: Secondary | ICD-10-CM | POA: Diagnosis present

## 2014-10-23 DIAGNOSIS — W19XXXA Unspecified fall, initial encounter: Secondary | ICD-10-CM | POA: Diagnosis present

## 2014-10-23 DIAGNOSIS — I10 Essential (primary) hypertension: Secondary | ICD-10-CM | POA: Diagnosis present

## 2014-10-23 DIAGNOSIS — Z885 Allergy status to narcotic agent status: Secondary | ICD-10-CM

## 2014-10-23 DIAGNOSIS — Y92009 Unspecified place in unspecified non-institutional (private) residence as the place of occurrence of the external cause: Secondary | ICD-10-CM

## 2014-10-23 DIAGNOSIS — S129XXA Fracture of neck, unspecified, initial encounter: Secondary | ICD-10-CM | POA: Diagnosis present

## 2014-10-23 LAB — CBC AND DIFFERENTIAL
Basophils %: 0.2 % (ref 0.0–3.0)
Basophils Absolute: 0 10*3/uL (ref 0.0–0.3)
Eosinophils %: 0.4 % (ref 0.0–7.0)
Eosinophils Absolute: 0 10*3/uL (ref 0.0–0.8)
Hematocrit: 35.9 % — ABNORMAL LOW (ref 36.0–48.0)
Hemoglobin: 12.2 gm/dL (ref 12.0–16.0)
Lymphocytes Absolute: 0.9 10*3/uL (ref 0.6–5.1)
Lymphocytes: 7.8 % — ABNORMAL LOW (ref 15.0–46.0)
MCH: 33 pg (ref 28–35)
MCHC: 34 gm/dL (ref 32–36)
MCV: 96 fL (ref 80–100)
MPV: 7.7 fL (ref 6.0–10.0)
Monocytes Absolute: 0.5 10*3/uL (ref 0.1–1.7)
Monocytes: 4.5 % (ref 3.0–15.0)
Neutrophils %: 87.1 % — ABNORMAL HIGH (ref 42.0–78.0)
Neutrophils Absolute: 9.9 10*3/uL — ABNORMAL HIGH (ref 1.7–8.6)
PLT CT: 188 10*3/uL (ref 130–440)
RBC: 3.72 10*6/uL — ABNORMAL LOW (ref 3.80–5.00)
RDW: 17 % — ABNORMAL HIGH (ref 11.0–14.0)
WBC: 11.3 10*3/uL — ABNORMAL HIGH (ref 4.0–11.0)

## 2014-10-23 LAB — COMPREHENSIVE METABOLIC PANEL
ALT: 18 U/L (ref 0–55)
AST (SGOT): 27 U/L (ref 10–42)
Albumin/Globulin Ratio: 1.38 Ratio (ref 0.70–1.50)
Albumin: 4.4 gm/dL (ref 3.5–5.0)
Alkaline Phosphatase: 84 U/L (ref 40–145)
Anion Gap: 13 mMol/L (ref 7.0–18.0)
BUN / Creatinine Ratio: 20.2 Ratio (ref 10.0–30.0)
BUN: 23 mg/dL — ABNORMAL HIGH (ref 7–22)
Bilirubin, Total: 1 mg/dL (ref 0.1–1.2)
CO2: 30.8 mMol/L — ABNORMAL HIGH (ref 20.0–30.0)
Calcium: 10.5 mg/dL (ref 8.5–10.5)
Chloride: 101 mMol/L (ref 98–110)
Creatinine: 1.14 mg/dL (ref 0.60–1.20)
EGFR: 44 mL/min/{1.73_m2}
Globulin: 3.2 gm/dL (ref 2.0–4.0)
Glucose: 131 mg/dL — ABNORMAL HIGH (ref 70–99)
Osmolality Calc: 287 mOsm/kg (ref 275–300)
Potassium: 3.8 mMol/L (ref 3.5–5.3)
Protein, Total: 7.6 gm/dL (ref 6.0–8.3)
Sodium: 141 mMol/L (ref 136–147)

## 2014-10-23 LAB — PT AND APTT
PT INR: 1 (ref 0.5–1.3)
PT: 10.7 s (ref 9.5–11.5)
aPTT: 21.3 s — ABNORMAL LOW (ref 24.0–34.0)

## 2014-10-23 MED ORDER — FUROSEMIDE 40 MG PO TABS
40.0000 mg | ORAL_TABLET | Freq: Every day | ORAL | Status: DC
Start: 2014-10-24 — End: 2014-10-26
  Administered 2014-10-24 – 2014-10-26 (×3): 40 mg via ORAL
  Filled 2014-10-23 (×3): qty 1

## 2014-10-23 MED ORDER — GABAPENTIN 100 MG PO CAPS
100.0000 mg | ORAL_CAPSULE | Freq: Two times a day (BID) | ORAL | Status: DC
Start: 2014-10-23 — End: 2014-10-26
  Administered 2014-10-23 – 2014-10-26 (×6): 100 mg via ORAL
  Filled 2014-10-23 (×7): qty 1

## 2014-10-23 MED ORDER — METOPROLOL SUCCINATE ER 50 MG PO TB24
50.0000 mg | ORAL_TABLET | Freq: Every day | ORAL | Status: DC
Start: 2014-10-23 — End: 2014-10-26
  Administered 2014-10-23 – 2014-10-26 (×4): 50 mg via ORAL
  Filled 2014-10-23 (×4): qty 1

## 2014-10-23 MED ORDER — HYDROCODONE-ACETAMINOPHEN 5-325 MG PO TABS
1.0000 | ORAL_TABLET | ORAL | Status: DC | PRN
Start: 2014-10-23 — End: 2014-10-26
  Administered 2014-10-23 – 2014-10-24 (×3): 1 via ORAL
  Administered 2014-10-24: 2 via ORAL
  Administered 2014-10-25 – 2014-10-26 (×2): 1 via ORAL
  Filled 2014-10-23: qty 1
  Filled 2014-10-23: qty 2
  Filled 2014-10-23 (×4): qty 1

## 2014-10-23 MED ORDER — PRAVASTATIN SODIUM 40 MG PO TABS
40.0000 mg | ORAL_TABLET | Freq: Every evening | ORAL | Status: DC
Start: 2014-10-23 — End: 2014-10-26
  Administered 2014-10-23 – 2014-10-25 (×3): 40 mg via ORAL
  Filled 2014-10-23 (×4): qty 1

## 2014-10-23 MED ORDER — HEPARIN SODIUM (PORCINE) PF 5000 UNIT/0.5ML IJ SOLN
5000.0000 [IU] | Freq: Three times a day (TID) | INTRAMUSCULAR | Status: DC
Start: 2014-10-23 — End: 2014-10-26
  Administered 2014-10-23 – 2014-10-26 (×8): 5000 [IU] via SUBCUTANEOUS
  Filled 2014-10-23 (×11): qty 0.5

## 2014-10-23 MED ORDER — POTASSIUM CHLORIDE CRYS ER 20 MEQ PO TBCR
20.0000 meq | EXTENDED_RELEASE_TABLET | Freq: Every day | ORAL | Status: DC
Start: 2014-10-24 — End: 2014-10-26
  Administered 2014-10-24 – 2014-10-26 (×3): 20 meq via ORAL
  Filled 2014-10-23 (×3): qty 1

## 2014-10-23 MED ORDER — DORZOLAMIDE HCL 2 % OP SOLN
1.0000 [drp] | Freq: Three times a day (TID) | OPHTHALMIC | Status: DC
Start: 2014-10-23 — End: 2014-10-26
  Administered 2014-10-23 – 2014-10-26 (×8): 1 [drp] via OPHTHALMIC
  Filled 2014-10-23: qty 200

## 2014-10-23 NOTE — Plan of Care (Signed)
Patient arrived from ED and oriented to room.

## 2014-10-23 NOTE — ED Notes (Signed)
Admitting provider at bedside examining pt

## 2014-10-23 NOTE — ED Notes (Signed)
Stiff collar removed and pt placed in asp[en collar (midline position maintained)

## 2014-10-23 NOTE — Plan of Care (Signed)
Problem: Pain  Goal: Patient's pain/discomfort is manageable  Intervention: Include patient/family/caregiver in decisions related to pain management  Patient and Rn reviewed pain management plan. Patient will requested pain medication as needed.

## 2014-10-23 NOTE — Progress Notes (Signed)
Patient was admitted to the floor. Patient has Aspen collar in place and is bedrest. Patient was able to take PO medication without issue. Patient has new additional issues at the time of this note. Patient will continue to be monitored. Call bell within reach. Bed alarm activated.

## 2014-10-23 NOTE — H&P (Signed)
HISTORY AND PHYSICAL EXAM    Date Time: 10/23/2014 5:28 PM  Patient Name: [facility] WHITE  Attending Physician: [place], Patrick D, MD      Chief Complaint   Cervical fracture    History of Present Illness:   Kelli Brown is a 80 y.o. female who presented with headache and neck pain sustained after a fall. The patient states that approximately 11:30 this morning she fell while climbing off a stool. Patient states that she fell backwards striking her head and neck. Patient denies any loss of consciousness. She does state that she had sudden headache and neck pain after the fall. She denies any numbness, weakness or pain in her extremities. Patient denies any other injuries associated with the fall. She has had no interval vomiting or lethargy.  CT scan of cervical spine showed Odontoid fracture and fracture of C1.  She has been placed in a cervical collar    Past Medical History:     Past Medical History   Diagnosis Date   . Hypertension    . Shingles    . Macular degeneration        Past Surgical History:     Past Surgical History   Procedure Laterality Date   . Appendectomy     . Tonsillectomy     . Hip surgery         Family History:   No family history on file.    Social History:     History     Social History   . Marital Status: Widowed     Spouse Name: N/A     Number of Children: N/A   . Years of Education: N/A     Social History Main Topics   . Smoking status: Not on file   . Smokeless tobacco: Not on file   . Alcohol Use: No   . Drug Use: Not on file   . Sexual Activity: Not on file     Other Topics Concern   . Not on file     Social History Narrative   . No narrative on file       Allergies:     Allergies   Allergen Reactions   . Codeine    . Darvon [Propoxyphene]    . Phenobarbital        Medications:     (Not in a hospital admission)    Review of Systems:   A comprehensive review of systems was: General ROS: negative for - chills, fatigue, fever, malaise, night sweats  Ophthalmic ROS: negative  for - blurry vision,, double vision, loss of vision or photophobia  ENT ROS: negative for -   Vertigo positive for headache  Respiratory ROS: negative for - cough, shortness of breath,  stridor, tachypnea or wheezing  Cardiovascular ROS: negative for - chest pain, dyspnea on exertion, irregular heartbeat, loss of consciousness, murmur,  palpitations,  or rapid heart rate  Gastrointestinal ROS: negative for - abdominal pain,  constipation, diarrhea, , nausea/vomiting, stool incontinence or swallowing difficulty/pain  Genito-Urinary ROS: negative for -  hematuria, incontinence,  urinary frequency/urgency  Musculoskeletal ROS: negative for - joint pain, joint stiffness, joint swelling, muscle pain or muscular weakness positive for neck pain  Neurological ROS: negative for - behavioral changes, confusion, dizziness, gait disturbance, impaired coordination/balance, memory loss, numbness/tingling, seizures, speech problems, tremors, visual changes or weakness    Physical Exam:     Filed Vitals:    10/23/14 1700   BP: 127/62  Pulse:    Temp:    Resp:    SpO2: 93%       Intake and Output Summary (Last 24 hours) at Date Time  No intake or output data in the 24 hours ending 10/23/14 1728    General appearance - NC/ AT head, oriented to person, place, and time, well kept, neat appearence  Neck - supple, carotids upstroke normal bilaterally, no bruits, nontender  Chest - clear to auscultation, no wheezes, rales or rhonchi, symmetric air entry  Heart - normal rate, regular rhythm,  no murmurs  Abdomen - soft, nontender, nondistended, normal bowel sounds  Neurological - alert, orientedx3, normal speech-fluent, cognitions intact. Names and repeats well. CN II-XII intact.   Motor exam: G 5/5, I 5/5, B 5/5, T 5/5, D 5/5, IS 5/5, Q 5/5 H 5/5 DF 5/5 PF 5/5 with normal bulk and tone.  Sensory exam intact.  Reflexes: RJ 2/2, BJ 2/2, TJ 2/2, KJ 2/2, AJ 2/2 with toes downgoing and no clonus or cross adductors. No Hoffman's signs.     Extremities - Warm without edema X 4 extremities  Skin - normal coloration with no rashes    Labs:     Results     Procedure Component Value Units Date/Time    PT/APTT [[id number]]  (Abnormal) Collected:  10/23/14 1506    Specimen Information:  Blood Updated:  10/23/14 1548     PT 10.7 sec      PT INR 1.0      aPTT 21.3 (L) sec     Comprehensive metabolic panel [[id number]]  (Abnormal) Collected:  10/23/14 1506    Specimen Information:  Blood / Plasma Updated:  10/23/14 1541     Sodium 141 mMol/L      Potassium 3.8 mMol/L      Chloride 101 mMol/L      CO2 30.8 (H) mMol/L      CALCIUM 10.5 mg/dL      Glucose 782 (H) mg/dL      Creatinine 9.56 mg/dL      BUN 23 (H) mg/dL      Protein, Total 7.6 gm/dL      Albumin 4.4 gm/dL      Alkaline Phosphatase 84 U/L      ALT 18 U/L      AST (SGOT) 27 U/L      Bilirubin, Total 1.0 mg/dL      Albumin/Globulin Ratio 1.38 Ratio      Anion Gap 13.0 mMol/L      BUN/Creatinine Ratio 20.2 Ratio      EGFR 44 mL/min/1.22m2      Osmolality Calc 287 mOsm/kg      Globulin 3.2 gm/dL     CBC and differential [[id number]]  (Abnormal) Collected:  10/23/14 1506    Specimen Information:  Blood / Blood Updated:  10/23/14 1528     WBC 11.3 (H) K/cmm      RBC 3.72 (L) M/cmm      Hemoglobin 12.2 gm/dL      Hematocrit 46.9 (L) %      MCV 96 fL      MCH 33 pg      MCHC 34 gm/dL      RDW 62.9 (H) %      PLT CT 188 K/cmm      MPV 7.7 fL      NEUTROPHIL % 87.1 (H) %      Lymphocytes 7.8 (L) %      Monocytes 4.5 %  Eosinophils % 0.4 %      Basophils % 0.2 %      Neutrophils Absolute 9.9 (H) K/cmm      Lymphocytes Absolute 0.9 K/cmm      Monocytes Absolute 0.5 K/cmm      Eosinophils Absolute 0.0 K/cmm      BASO Absolute 0.0 K/cmm           Rads:   Radiological Procedure reviewed.    Assessment:   Odontoid and C1 fracture  Patient Active Problem List   Diagnosis   . Cervical spine fracture               Plan:     CERVICAL COLLAR AT ALL TIMES and STRICT BEDREST   Log roll  Fit with [place] J collar  Serial  cervical x-rays in collar    Signed by: Collier Bullock, NP      I hereby certify this patient for hospitalization based upon medical necessity as noted above.

## 2014-10-23 NOTE — Progress Notes (Signed)
Patient arrived from ED, Aspen collar in place.  ED Safe Handoff report and flowsheets reviewed.

## 2014-10-23 NOTE — ED Provider Notes (Signed)
[facility]  EMERGENCY DEPARTMENT  History and Physical Exam     Patient Name: Kelli Brown, Kelli Brown  Encounter Date:  10/23/2014  Attending Physician: Caralyn Guile. Rachel Moulds, M.D.  Room:  W43/W43-A  Patient DOB:  12-Apr-1919  Age: 80 y.o. female  MRN:  [id number]  PCP: Roland Earl, MD      Diagnosis/Disposition:  MDM:     Final Impression  1. Closed fracture of first cervical vertebra, unspecified fracture morphology, initial encounter    2. Closed displaced fracture of second cervical vertebra, unspecified fracture morphology, initial encounter        Disposition  ED Disposition     Admit Bed Type: Neuro [12]  Admitting Physician: [place], PATRICK DAVID [[id number]]  Patient Class: Inpatient [101]            Follow up  No follow-up provider specified.    Prescriptions  New Prescriptions    No medications on file         The case was discussed with neurosurgery. The patient will be admitted.  The patient has remained neurovascularly intact throughout the entire ED course.          The results of diagnostic studies have been reviewed by myself. Available past medical, family, social, and surgical histories have been reviewed by myself. The clinical impression and plan have been discussed with the patient and/or the patient's family. All questions have been answered.      History of Presenting Illness:     Chief complaint: Fall      Kelli Brown is a 80 y.o. female presenting with headache and neck pain sustained after a fall. The patient states that approximately 11:30 this morning she fell while climbing off a stool. Patient states that she fell backwards striking her head and neck. Patient denies any loss of consciousness. She does state that she had sudden headache and neck pain after the fall. She denies any numbness or weakness in her extremities. Patient denies any other injuries associated with the fall. She has had no interval vomiting or lethargy.        Review of Systems:  Physical Exam:      Review of Systems   Constitutional: Negative for fever, chills and fatigue.   HENT: Negative for congestion, ear pain and sore throat.    Respiratory: Negative for cough, chest tightness and shortness of breath.    Cardiovascular: Negative for chest pain and palpitations.   Gastrointestinal: Negative for nausea, vomiting, abdominal pain and diarrhea.   Genitourinary: Negative for dysuria, frequency and hematuria.   Musculoskeletal: Positive for neck pain. Negative for myalgias and arthralgias.   Skin: Negative for rash.   Neurological: Positive for headaches. Negative for dizziness and numbness.   Hematological: Negative for adenopathy.   Psychiatric/Behavioral: Negative for dysphoric mood and agitation. The patient is not nervous/anxious.      Blood pressure 144/63, pulse 86, temperature 98.3 F (36.8 C), temperature source Oral, resp. rate 20, weight 70.761 kg, SpO2 98 %.    Physical Exam   Constitutional: She is oriented to person, place, and time. She appears well-developed and well-nourished. Cervical collar in place.   HENT:   Head: Normocephalic and atraumatic.   Mouth/Throat: Oropharynx is clear and moist. No oropharyngeal exudate.   Eyes: EOM are normal. Pupils are equal, round, and reactive to light.   Neck: Trachea normal and normal range of motion. Neck supple. No JVD present.   Cardiovascular: Normal rate, regular rhythm, normal heart sounds  and normal pulses.    Pulmonary/Chest: Effort normal and breath sounds normal.   Abdominal: Soft. Bowel sounds are normal. There is no splenomegaly or hepatomegaly. There is no tenderness. There is no rigidity, no rebound and no guarding.   Musculoskeletal: Normal range of motion.   Neurological: She is alert and oriented to person, place, and time. She has normal strength. No cranial nerve deficit or sensory deficit.   Skin: Skin is warm, dry and intact.   Psychiatric: She has a normal mood and affect. Her speech is normal. She is not agitated.   Nursing note  and vitals reviewed.         Allergies & Medications:     Pt is allergic to codeine; darvon; and phenobarbital.    Current/Home Medications    DORZOLAMIDE (TRUSOPT) 2 % OPHTHALMIC SOLUTION        FUROSEMIDE (LASIX) 40 MG TABLET        GABAPENTIN (NEURONTIN) 100 MG CAPSULE        KLOR-CON M 20 MEQ TABLET        METOPROLOL XL (TOPROL-XL) 50 MG 24 HR TABLET        PRAVASTATIN (PRAVACHOL) 40 MG TABLET             Past History:     Medical: Pt has a past medical history of Hypertension; Shingles; and Macular degeneration.    Surgical: Pt  has past surgical history that includes Appendectomy; Tonsillectomy; and Hip surgery.    Family: The family history is not on file.    Social: Pt reports that she does not drink alcohol. Her tobacco and drug histories are not on file.        Diagnostic Results:     Radiologic Studies  Ct Head Wo-headache (rad Read)    10/23/2014   1.  There are fractures of C1 and C2 which are seen to better advantage on same-day CT scan of the cervical spine. 2.  Minimal cerebral atrophy. 3.  Nonspecific periventricular and subcortical Brown matter hypoattenuation is noted. This is suggestive of chronic small vessel ischemia.  ReadingStation:[id number]    Ct Cervical Spine Wo    10/23/2014   Fracture the base of the odontoid and anterior arch of C1 with normal alignment. No canal encroachment. Multiple degenerative cervical degenerative disc with osteoarthritic change. Findings called to Dr. Nelda Severe and the ED 10/23/2014 2:23 PM.  ReadingStation:[email or web address]      Lab Studies  Labs Reviewed   CBC AND DIFFERENTIAL   COMPREHENSIVE METABOLIC PANEL   PT AND APTT         Procedure/EKG:             ATTESTATIONS     [facility] D. Rachel Moulds, M.D.             Tessie Fass, MD  10/23/14 [phone]

## 2014-10-23 NOTE — ED Notes (Signed)
c-collar applied  

## 2014-10-23 NOTE — ED Notes (Addendum)
Pt resting in bed, awakens easily to voice, ao x 4 . Aspen collar remains in place, neuro and respiratory assessment remains intact (no deficiencies), pain to neck tolerable ,pt requests help with repositioning at times for comfort. Vital signs as charted

## 2014-10-23 NOTE — ED Notes (Signed)
Picked up from pt from CT, pt in cervical collar

## 2014-10-23 NOTE — ED Notes (Signed)
Pt resting in bed, friend at bedside

## 2014-10-24 ENCOUNTER — Inpatient Hospital Stay: Payer: Medicare Other

## 2014-10-24 LAB — URINALYSIS
Bilirubin, UA: NEGATIVE
Blood, UA: NEGATIVE
Glucose, UA: NEGATIVE mg/dL
Ketones UA: NEGATIVE mg/dL
Leukocyte Esterase, UA: NEGATIVE Leu/uL
Nitrite, UA: NEGATIVE
Protein, UR: NEGATIVE mg/dL
RBC, UA: 8 /hpf — ABNORMAL HIGH (ref 0–5)
Squam Epithel, UA: 1 /hpf (ref 0–2)
Urine Specific Gravity: 1.017 (ref 1.001–1.040)
Urobilinogen, UA: NORMAL mg/dL
WBC, UA: 3 /hpf (ref 0–4)
pH, Urine: 7 pH (ref 5.0–8.0)

## 2014-10-24 MED ORDER — ONDANSETRON HCL 4 MG/2ML IJ SOLN
4.0000 mg | Freq: Four times a day (QID) | INTRAMUSCULAR | Status: DC | PRN
Start: 2014-10-24 — End: 2014-10-26

## 2014-10-24 NOTE — Progress Notes (Addendum)
Spoke to pt's granddaughter Irving Burton) regarding pt's plan of care after getting pt's permission.  Granddaughter would like any updates regarding pt.  Stated understanding regarding plan of care.

## 2014-10-24 NOTE — Progress Notes (Addendum)
[facility]               [address], [place] [address]              Case Management / Social Work       [phone]      Estimated D/C Date: TBD      PCP/Last visit: [facility] assessment/PLOF: Pt independent, lives alone, no family close by      [place] and Insurance:  Medicare/commercial      Community Services: na      DME's/Supplier: na      Inpatient Plan of Care: Pt admitted after a fall at home while climbing off a stool with C1 fracture. Xray's pending, and once stable PT and OT to work with. CM to follow to assess ongoing needs      CM Interventions: CM to follow to assess home needs.Pt may need short term rehab prior to going home alone      Transportation:Na      Barriers to discharge:  NA      D/C Plan and Needs:  TBD    Reviewed chart notes and discussed d/c plan with MD, bedside RN, therapy and patient and family when available during inpatient course of treatment.      Leonides Grills, RN, BSN  Nurse Case Manager  Neurology, Neurosurgery  Ph [phone]  Fx [phone]

## 2014-10-24 NOTE — UM Notes (Signed)
10/23/14 1728  Inpatient class  Payor: MEDICARE / Plan: MEDICARE PART A AND B / Product Type: *No Product type* /   ED admission     DX: C spine fracture   Odontoid and c1 fx    HPI   Fell backwards. She missed step coming off stepstool. She did have neck pain. noted to have an odontoid type II fracture without any displacement, and a fracture of the anterior ring of C1. There appear to be a fracture in the posterior right lamina of C1 but this looked old. Patient has been neurologically intact. She is in a rigid cervical collar.     Past Medical History   Diagnosis Date   . Hypertension    . Shingles    . Macular degeneration        Past Surgical History   Procedure Laterality Date   . Appendectomy     . Tonsillectomy     . Hip surgery Bilateral      Tubes tied   . Eye surgery       cateracts     Labs:   WBC 11.3   VS: 36.8, 86, 98% on RA, rr 20, 144/63     CT C spine  Fracture the base of the odontoid and anterior arch of C1 with normal alignment      Plan to treat her in a cervical collar. Admit for pain control and serial neurological evaluation. Obtain upright x rays in [place] J collar If her fracture remains stable in the upright position we will mobilize her out of bed and ambulate as tolerated. Ultimately we'll try to treat her cervical fracture in a rigid cervical collar. Operative intervention and they are halo immobilization in this age group carries a greater risk for neurologic injury from this fracture     Orders:   C collar at all times, strict bedrest, log roll, fit with mi[place] J collar , Serial C x rays in collar, neuro check q 4hr, PT/OT   Heparin sq q 8hr,  Norco x 3 in 1st 24hr

## 2014-10-24 NOTE — OT Progress Note (Signed)
[facility]  [facility]  [address]  [place] [address]  Department of Rehabilitation Services  [phone]    Aubrynn Katona Goel CSN: [id number]  NEURO SURGICAL 431/431-A    Occupational Therapy General Note      Attempted to see pt for OT  Patient is on bedrest at this time. Will continue to follow.    Team communication: PT    Plan: Will follow.     Donell Sievert OTR/L

## 2014-10-24 NOTE — Progress Notes (Addendum)
NEUROSURGERY ROUNDING NOTE    Date/Time: 10/24/2014 9:14 AM  Patient Name: Kelli Brown  Length of Stay: Day 1  For questions page: Collier Bullock, NP-C pager [phone] or Dr. [place] pager # [phone]    Subjective:   Complaints: I had some nausea and vomiting but feel OK now.  No arm or leg pain or numbness    Vitals:   Temp:  [97.3 F (36.3 C)-98.3 F (36.8 C)] 98.1 F (36.7 C)  Heart Rate:  [69-102] 94  Resp Rate:  [16-20] 18  BP: (114-150)/(59-75) 134/68 mmHg       Exam:   Mental Status: Awake & Oriented x 3. Follows commands. No acute distress  Motor: 5/5 throughout  Sensory: Intact globally      Assessment/Plan:   Zofran ordered  X-rays pending  [place] J to be fit this am prior to x-ray    Signed by:  Bradd Canary. Orson Aloe, NP  Neurosurgery      AGREE WITH Ms. Orson Aloe, CNP's NOTE ABOVE      Davine Coba David [place], MD

## 2014-10-24 NOTE — Progress Notes (Signed)
Pt back from MRI.  Remains in miami J collar.  Currently on bedpan attempting to void.  C/o neck pain 7/10.  Will medicate per MD order.

## 2014-10-24 NOTE — PT Eval Note (Signed)
[facility]: [facility]  Department of Rehabilitation Services: [phone]  Kelli Brown    CSN: [id number]    NEURO SURGICAL   431/431-A    Physical Therapy Evaluation    Time of treatment:  Time Calculation  PT Received On: 10/24/14  Start Time: 0928  Stop Time: 0955  Time Calculation (min): 27 min    Visit#: 1                                                                                 Precautions and Contraindications:   Spinal Precautions (no bending, lifting or twisting)  Brace Precautions: Neck brace: [place] J collar and on at all times  Falls  Bedrest until cleared with upright xrays with [place] J collar    Assessment:      Kelli Brown was admitted 10/23/2014 with headache and neck pain after a fall from a step stool.  She was evaluated in the emergency department and noted to have an odontoid type II fracture without any displacement, and a fracture of the anterior ring of C1. There appear to be a fracture in the posterior right lamina of C1 but this looked old.    Patient presenting with the following PT Impairments:decreased safety/judgement during functional mobility, decreased activity tolerance, decreased functional mobility, pain    Patient will benefit from skilled PT services for further mobility evaluation after cleared by upright cervical xrays in [place] J collar.     Discussed risk, benefits and Plan of Care with: Patient    Goals:  (ONCE PATIENT IS CLEARED FOR MOBILITY - pending upright cervical xrays with [place] J collar)  STGs (1-3 visits):  1.  Pt will perform logroll with min A (NEW)  2.  Pt will perform supine to/from sit with logroll technique with min A (NEW)  3.  Pt will perform sit to stand to FWW with min A (NEW)  4.  Pt will perform SPT to chair with FWW with min A (NEW)  5.  Pt will amb 15 ft with FWW with min A (NEW)    LTGs to be determined based on patient progress in therapy    Plan:   Treatment/interventions: Gait training, Neuromuscular re-education,  Functional transfer training, Patient/caregiver training, Equipment eval/education, Bed mobility, Compensatory technique education, Continued evaluation    Treatment Frequency: 4-5x/wk    DISCHARGE RECOMMENDATIONS   DME recommended for Discharge:   Front wheeled walker  based on patient's progress in therapy    Discharge Recommendations:   Home with home health PT;Other (Comment) (assist from friends for transportation, housekeeping) Pending progress in next therapy session    History of Present Illness:     Medical Diagnosis: Closed fracture of first cervical vertebra, unspecified fracture morphology, initial encounter [S12.000A]  Closed displaced fracture of second cervical vertebra, unspecified fracture morphology, initial encounter [S12.100A]    Kelli Brown is a 80 y.o. female admitted on 10/23/2014  (per Neurosurgery note) "Patient is a pleasant 80 year old female that fell backwards. She did not lose consciousness. She missed a step when she was coming down off the stepping stool. There was no syncope. She did have neck pain. She was evaluated  in the emergency department and noted to have an odontoid type II fracture without any displacement, and a fracture of the anterior ring of C1. There appear to be a fracture in the posterior right lamina of C1 but this looked old. Patient has been neurologically intact. She is in a rigid cervical collar.  Highlights of my examination reveal pleasant lady who is cognitively intact. Motor and sensory exam intact in all 4 extremities.  At this juncture we'll plan to treat her in a cervical collar. I'll admit her for pain control and serial neurologic examination. We'll obtain upright x-rays in a [place] J collar. If her fracture remains stable in the upright position we will mobilize her out of bed and ambulate as tolerated. Ultimately we'll try to treat her cervical fracture in a rigid cervical collar. Operative intervention and they are halo immobilization in this  age group carries a greater risk for neurologic injury from this fracture."    Patient Active Problem List   Diagnosis   . Cervical spine fracture      X-Rays/Tests/Labs:  Radiologic Studies  Ct Head Wo-headache (rad Read)    10/23/2014   1.  There are fractures of C1 and C2 which are seen to better advantage on same-day CT scan of the cervical spine. 2.  Minimal cerebral atrophy. 3.  Nonspecific periventricular and subcortical white matter hypoattenuation is noted. This is suggestive of chronic small vessel ischemia.  ReadingStation:[id number]    Ct Cervical Spine Wo    10/23/2014   Fracture the base of the odontoid and anterior arch of C1 with normal alignment. No canal encroachment. Multiple degenerative cervical degenerative disc with osteoarthritic change. Findings called to Dr. Nelda Severe and the ED 10/23/2014 2:23 PM.  ReadingStation:[id number]    Past Medical/Surgical History:  Past Medical History   Diagnosis Date   . Hypertension    . Shingles    . Macular degeneration       Past Surgical History   Procedure Laterality Date   . Appendectomy     . Tonsillectomy     . Hip surgery Bilateral      Tubes tied   . Eye surgery       cateracts         Social History:   Home Living Arrangements:  Living Arrangements: Alone  Assistance Available: None, pt has a good friend nearby who can help somewhat  Type of Home: Apartment  Home Layout: One level, with 29 stair(s) to enter, bilateral rail(s) , no elevator  Walk in shower with shower seat    Prior Level of Function:  Community ambulation  Mobility:  Independent with  No assistive device   Pt was driving and independent with all ADLs and IADLs    DME available at home:  Shower chair    Subjective   "I was getting Christmas decorations out and I thought I was on the bottom step." Pt is cleared by nursing to have PT fit patient with [place] J collar for upright xrays.      Patient/caregiver goal for PT: return to PLOF    Pain:  At Rest: 6/10    Objective:   Patient's medical  condition is appropriate for Physical therapy fitting of [place] J collar for upright xrays to be performed.    Observation of patient  Patient is in bed with Aspen cervical collar in place, HOB below 30 degrees    Cognition:  Oriented to: Oriented x4  Command following: Follows multi-step commands  with increased time, Follows multi-step commands with repetition  Alertness/Arousal: Appropriate responses to stimuli     Musculoskeletal Examination:        Range of motion:  Not tested this session       Strength  Not tested this session     Functional Mobility:   Bed Mobility:   Rolling to Left:  Moderate assist.  Cues for Log rolling., Cues for Hand placement.  Rolling to Right:   Moderate assist.  Cues for Log rolling., Cues for Hand placement.    Transfers:  Not tested due to bedrest precautions    Locomotion:  Not tested due to bedrest    Participation and Activity Tolerance   Participation effort: Good  Activity Tolerance: Limited by pain    G-codes:   G-codes applicable (current status: Inpatient): no                     Treatment Interventions this session:   Evaluation  Brace fit: Type: [place] J cervical collar.  Size: short/small    Education Provided:   TOPICS: role of physical therapy, plan of care, goals of therapy and brace management    Learner educated: Patient  Method: Explanation  Response to education: Needs reinforcement    Patient Position at End of Treatment:   Supine, in bed, Needs in reach, No distress and [place] J collar in place, HOB below 30 deg    Team Communication:     Spoke to : RN/LPN - Tameika  Regarding: Pre-session re: patient status, Patient position at end of session, [place] J collar on and patient ready for [facility] updated: N/A  PT/PTA communication: via written note and verbal communication as needed.      Recommend patient continue to use bedpan until cervical xrays cleared for patient to mobilize.    Kashif Pooler, PT

## 2014-10-24 NOTE — Progress Notes (Signed)
Patient utilized the bed pan over night to void. Patient has pain and stiffness when asked to log roll with assistance. Patient had c/o 7/10 pain. Patient was given PRN pain medication. Patient was asleep and resting at the time of reassessment. Patient will continue to be monitored.

## 2014-10-25 MED ORDER — BENZOCAINE-MENTHOL 15-3.6 MG MT LOZG
1.00 | LOZENGE | Freq: Four times a day (QID) | OROMUCOSAL | Status: DC | PRN
Start: 2014-10-25 — End: 2014-10-26
  Administered 2014-10-25 (×2): 1 via ORAL
  Filled 2014-10-25: qty 18

## 2014-10-25 NOTE — Progress Notes (Signed)
[facility]             [address]         [place], [place] [address]              Case Management / Social Work        [phone]     Est d/c date:12/4    Assessment:CM met with pt after PT and OT worked with her. Both are rec short inpt stay at a SNF or 24/7. Due to pt living alone she will need short SNF stay and will talk with her family. Pt did agree to CM sending ref to [facility]. CM and SW to follow    Discharge plan:SNF    Barriers:na    Reviewed chart notes and discussed d/c plan with MD, bedside RN, therapy and patient and family when available during inpatient course of treatment.    Leonides Grills, RN, BSN  Case Manager, Neurology  [phone]

## 2014-10-25 NOTE — PT Progress Note (Signed)
[facility]: [facility]  Department of Rehabilitation Services: [phone]  Cheyla Duchemin Paules    CSN: [id number]    NEURO SURGICAL   431/431-A    Physical Therapy Treatment Note    Time of treatment:   Time Calculation  PT Received On: 10/25/14  Start Time: 1337  Stop Time: 1400  Time Calculation (min): 23 min    Visit#: 2    Last seen by Physical therapist on: 10/25/14    Medical Diagnosis/Pertinent medical/surgical details: C1 AND C2 FX    Precautions and Contraindications:  Spinal Precautions (no bending, lifting or twisting)  Falls    Assessment:   Patient's progress towards established goals: ADVANCED OOB TO AMB. MORE STABLE C FWW.     Patient continues to have the following impairments: decreased activity tolerance, impaired motor control , decreased functional mobility, decreased balance, gait deficits, pain    Patient will continue to benefit from skilled PT services in order to PERFORM INDEPENDENT MOBILITY AND GAIT.       Goals:     LTGs: (By d/c)  1. Patient will perform bed mobility INDEPENDENDENTLY in prep for out of bed activity.NEW   2. Patient will perform sit to stand transfers with WHEELED WALKER INDEPENDENTLY in prep for gait. NEW   3. Patient will ambulate 400' feet with WHEELED WALKER INDEPENDENTLY in prep for home mobility. NEW   4. Patient will ascend/descend 29 step(s) with CANE and CG assist and 1 rail(s) in order to demonstrate ability to enter home. NEW    5. Pt will verbalize and demonstrate compliance with all spinal precautions in order to promote optimal healing and safe mobilization. NEW   6. Patient and/or caregiver will demonstrate independence with LE HEP in order to improve ROM and strength in prep for transfers and gait. NEW       Plan:   Treatment/interventions: Exercise, Gait training, Stair training, Functional transfer training, Patient/caregiver training, Bed mobility    Treatment Frequency: 4-5x/wk    DISCHARGE RECOMMENDATIONS   DME recommended for Discharge:    Front wheeled walker    Discharge Recommendations:   SNF 24 hour supervision needed    Subjective:   "IT HURTS. I CAN'T GET UP. I CAN'T GET BACK TO BED."   Patient is agreeable to participation in the therapy session. Nursing clears patient for therapy.     Pain:  At Rest: 6 /10  With Activity: 6/10  Location: Neck  Interventions: Medication (see eMAR)    OBJECTIVE:   Observation of Patient/Vital Signs:   Patient is in bed. [place] J COLLAR APPLIED, BUT REQUIRED ADJUSTMENT ON THE R SIDE DUE TO STRAP MISPLACEMENT. NOTED GOOD FIT. PT REPORTED GOOD CONTINUED GOOD COMFORT.  Patient's medical condition is appropriate for Physical therapy intervention at this time.    Vital Signs:  SUPINE: 130/56  SITTING: 128/60  ACTIVITY: 118/54    Sensation: intact , to light touch    Oriented to: Oriented x4  Command following: Follows 1 step commands with increased time  Alertness/Arousal: Appropriate responses to stimuli   Attention Span:Appears intact  Memory: Decreased recall of precautions  Safety Awareness: minimal verbal instruction  Insights: Decreased awareness of deficits  Problem Solving: Assistance required to identify errors made, Assistance required to generate solutions, Assistance required to implement solutions, minimal assistance    Musculoskeletal and Balance Details:   BALANCE:  SITTING: MIN A INITIALLY C PT LEANING BACK, THEN SUPERVISION C UE SUPPORT.  STANDING: MIN A WITHOUT WALKER. CGA WITH  WALKER.   Functional Mobility:   Bed Mobility:   LOGROLL: MIN A L+R. SLOW AND GUARDED C CX PAIN. PHYSICAL CUES FOR UE ASSIST.  Supine to Sit: Moderate Assist FOR TRUNK RIGHTING. PT GUARDED AND RELUCTANT TO MOVE C CX PAIN.  SIT TO SUPINE: Moderate Assist X2 FOR TRUNKCONTROL AND LE MOVEMENT INTO BED. PT GUARDED AND RELUCTANT TO MOVE C CX PAIN.      Transfers:  Sit to Stand: Minimal Assist (with front wheeled walker)  STAND TO SIT: CGA. GOOD CONTROL C UE ASSIST.  Bed to Chair: Minimal Assist (with front wheeled walker)  PT SAT  OOB IN A CHAIR TO 5 MIN. C/O INCREASING DIZZINESS. MILD DROP IN BP. RETURNED TO BED.      Locomotion:  LEVEL AMBULATION:  Distance: 300'   Assistance level:  Minimal assist WITHOUT DEVICE. CGA C WHEELED WALKER.  Device:  Front wheeled walker  Pattern:  Step through, Decreased cadence, Decreased step length:  bilaterally, Decreased stance time:  left. DENIED PAIN LE'S. L ANKLE EVERTED.     Participation and Activity Tolerance   Participation effort: Good  Activity Tolerance: Tolerates 10-20 minutes exercise with multiple rests    Other Treatment Interventions this session:   Therapeutic activity  Gait training  Patient/family/caregiver education  Brace refit/train     Education Provided:   TOPICS: role of physical therapy, plan of care, goals of therapy and safety with mobility and ADLs, benefits of activity, spine precautions    Learner educated: Patient  Method: Explanation and Demonstration  Response to education: Needs reinforcement    Patient Position at End of Treatment:   Supine, in bed, Needs in reach, Bed/chair alarm set and No distress    Team Communication:   Spoke to : RN, RNCM  Regarding: Pre-session re: patient status, Patient position at end of session, Discharge needs, Patient participation with Therapy, Vital signs, Further recommendations  Whiteboard updated: Yes  PT/PTA communication: via written note and verbal communication as needed.      Recommend patient INCREASE OOB ACTIVITY C FWW AND AX2 outside of PT sessions.    Maryann Alar

## 2014-10-25 NOTE — Plan of Care (Signed)
Problem: Impaired Mobility  Goal: Mobility/activity is maintained at optimum level for patient  Outcome: Progressing  Pt to remain on bed rest.  [place] J collar on at all times.  HOB 30 degrees or less per MD order. Pt tolerating well.

## 2014-10-25 NOTE — Progress Notes (Signed)
Pt slightly disoriented this AM.  Trying to get out of bed.  Pt oriented back to surroundings.  Currently alert and oriented x3.

## 2014-10-25 NOTE — Progress Notes (Signed)
NEUROSURGERY BRIEF NOTE:      Diagnosis:   Patient Active Problem List   Diagnosis   . Cervical spine fracture      Length of Stay:2          Doing well. Has been up in c-collar. Plan for [employer] before home.          Signed by: Tigerlily Christine David [place], MD

## 2014-10-25 NOTE — Progress Notes (Signed)
NEUROSURGERY ROUNDING NOTE    Date/Time: 10/25/2014 6:31 AM  Patient Name: Kelli Brown  Length of Stay: Day 2  For questions page: Collier Bullock, NP-C pager [phone] or Dr. [place] pager # [phone]    Subjective:   Complaints:   Nausea better    Vitals:   Temp:  [98 F (36.7 C)-98.5 F (36.9 C)] 98.5 F (36.9 C)  Heart Rate:  [64-94] 77  Resp Rate:  [15-20] 20  BP: (118-145)/(48-71) 127/64 mmHg       Exam:   Mental Status: Awake & Oriented x 3. Follows commands. No acute distress  Motor: 5/5 throughout  Sensory: Intact globally  [place] J collar on    Assessment/Plan:   Stable  Pt going for x-rays now  Continue care    Signed by:  Bradd Canary. Orson Aloe, NP  Neurosurgery

## 2014-10-25 NOTE — Progress Notes (Signed)
Pt is sitting up in bed eating dinner. Other than a sore throat for which she is receiving lozenges, she has no complaints of pain.

## 2014-10-25 NOTE — Progress Notes (Signed)
NEUROSURGERY FLOOR PROGRESS NOTE    Date Time: 10/25/2014 7:28 AM  Patient Name: Kelli Brown    Attending: Kacy Hegna David [place], MD    Subjective:   Patient without compaint, doing well., And she likes the [facility] J collar much better. She slept well and her appetite is better this morning.             Measurements Exam:   Temp:  [98 F (36.7 C)-98.5 F (36.9 C)] 98.5 F (36.9 C)  Heart Rate:  [64-79] 77  Resp Rate:  [15-20] 20  BP: (118-145)/(48-71) 127/64 mmHg     Intake and Output Summary (Last 24 hours) at Date Time    Intake/Output Summary (Last 24 hours) at 10/25/14 1610  Last data filed at 10/25/14 0420   Gross per 24 hour   Intake    200 ml   Output    160 ml   Net     40 ml           Neurological Exam:   MOTOR 5/5 IN UE BILAT, SENSORY INTACT IN UE BILAT and C-COLLAR IN PLACE    Labs:       Rads:         Assessment:   STABLE and CERVICAL SPINE FRACTURE-upright x-rays in the [place] J collar look stable to me. Radiologist raised the question about ligamentous instability. MRI scan was performed and I do not see any overt ligamentous issues. Alignment at the craniocervical junction looked normal.      Plan:   OOB AMBULATE, PAIN CONTROL, ADAT, C-COLLAR AT ALL TIMES, PT/OT CONSULTS, SUPPORTIVE CARE and MOBILIZE      Signed by: Kariana Wiles David [place], MD

## 2014-10-25 NOTE — OT Eval Note (Addendum)
[facility]  [facility]  [address]  [place] [address]  Department of Rehabilitation Services  [phone]   Chrystel Barefield Vazguez    CSN#: [id number]  NEURO SURGICAL 431/431-A    Occupational Therapy Evaluation    Consult received for Ferol Luz Sissel for OT Evaluation and Treatment.  Patient's medical condition is appropriate for Occupational therapy intervention at this time.    Time of treatment: Time Calculation  OT Received On: 10/25/14  Start Time: 1337  Stop Time: 1400  Time Calculation (min): 23 min    OT Visit Number: 1    Precautions and Contraindications:   Precautions- cleared by neurosurgery -OOB AMBULATE, PAIN CONTROL, ADAT, C-COLLAR AT ALL TIMES, PT/OT CONSULTS  Precaution Instructions Given to Patient: Yes  Neck Brace Applied: at all times- collar  Spinal Precautions: no lifting, no twisting, no bending  Other Precautions:  (falls)    Assessment:      Mollee Neer is a 80 y.o. female admitted 10/23/2014 presenting with fell at home sustaining a C1, C2  fracture. Patient's current impairments include: decreased independence with ADLs;balance deficits.  Patient will benefit from skilled OT services to increase i'ence with ADLs/transfers    Rehabilitation Potential: Prognosis: Good    Risks/benefits/POC discussed: with patient    Plan:     Treatment Interventions: ADL retraining;Functional transfer training;Patient/Family training;Equipment eval/education  OT Frequency Recommended: 4-5x/wk    Goals:  stgs-3 visits, ltg by [place]  Goal Formulation: Patient     ADL Goals  Patient will toilet: Minimal Assist  Other Goal:  (pt will be min A c donning/doffing pants using AD)  Other Goal #2:  (pt will be min A c donning/doffing socks using AD)  Mobility and Transfer Goals  Pt will transfer bed to toilet: Supervision  Other Goal:  (pt will be S c ADLs/transfers using AD )  LTG                                        DISCHARGE RECOMMENDATIONS          Discharge  Recommendation: SNF (vs 24/7 assist c ADls/S)    History of Present Illness:   Medical Diagnosis: Closed fracture of first cervical vertebra, unspecified fracture morphology, initial encounter [S12.000A]  Closed displaced fracture of second cervical vertebra, unspecified fracture morphology, initial encounter [S12.100A]    History of Present Illness: Rawan Riendeau is a 80 y.o. female admitted on 10/23/2014     Mariaceleste Herrera Messer is a 80 y.o. female presenting with headache and neck pain sustained after a fall. The patient states that approximately 11:30 this morning she fell while climbing off a stool. Patient states that she fell backwards striking her head and neck. Patient denies any loss of consciousness. She does state that she had sudden headache and neck pain after the fall. She denies any numbness or weakness in her extremities. Patient denies any other injuries associated with the fall. She has had no interval vomiting or lethargy.             Patient Active Problem List   Diagnosis   . Cervical spine fracture        Past Medical/Surgical History:  Past Medical History   Diagnosis Date   . Hypertension    . Shingles    . Macular degeneration       Past Surgical History  Procedure Laterality Date   . Appendectomy     . Tonsillectomy     . Hip surgery Bilateral      Tubes tied   . Eye surgery       cateracts         X-Rays/Tests/Labs:  Clinical History:  Headache     Examination:  CT head without intravenous contrast.     Comparison:  September 04, 2004.     Findings:  There is no evidence of intracranial hemorrhage, midline shift or mass effect. The sulci and ventricles are minimally  prominent, compatible with atrophy.. Patchy white matter hypoattenuation in the subcortical and periventricular regions  is noted. Scattered vascular calcifications are seen.     There is a minimally distracted fracture through the anterior arch of C1. Mildly distracted fractures are present along  the posterior arch of C1  adjacent to the lateral masses. There is also an oblique fracture through the dens. The  remaining osseous structures appear intact. The paranasal sinuses and mastoid air cells are clear. Postsurgical changes  are noted to both globes.     IMPRESSION:    1.  There are fractures of C1 and C2 which are seen to better advantage on same-day CT scan of the cervical spine.  2.  Minimal cerebral atrophy.  3.  Nonspecific periventricular and subcortical white matter hypoattenuation is noted. This is suggestive of chronic  small vessel ischemia.      Social History:  Prior Level of Function  Prior level of function: Independent with ADLs, Ambulates independently  Baseline Activity Level: Community ambulation, Household ambulation  Driving: independent  Dressing - Upper Body: independent  Dressing - Lower Body: independent  Cooking: Yes  Feeding: independent  Bathing: independent  Grooming: independent  Toileting: independent  DME Currently at Home: Single point cane, Grab bars  Home Living Arrangements  Living Arrangements: Alone  Type of Home: House  Home Layout: One level (29 steps to enter)  Bathroom Toilet: Raised  Bathroom Equipment: Paediatric nurse  DME Currently at Home: Single point cane, Grab bars    Subjective:      Patient is agreeable to participation in the therapy session.          Pain:  Pain Assessment  Pain Assessment: No/denies pain (c/o some neck discomfort but did not rate)                  Objective:     Observation of Patient:    Patient is in bed with no medical equipment in place.    Vital Signs: VSS- 130/56    BP Supine  mmHg   BP Sitting  mmHg   BP Standing  mmHg   BP c activity  mmHg   HR Supine  bpm   HR Sitting  bpm   HR standing  bpm   HR c activity  bpm   SpO2 at rest     SpO2 c activity          Cognitive Status and Neuro Exam:  Cognition  Arousal/Alertness: Appropriate responses to stimuli  Attention Span: Appears intact  Orientation Level: Oriented X4  Memory: Appears intact  Following  Commands: independent  Safety Awareness: independent  Insights: Fully aware of deficits    Neuro Status  Hand Dominance: right handed    Musculoskeletal Examination  Gross ROM  Right Upper Extremity ROM: within functional limits (no c/o numbness/tingling B UE or pain)  Left Upper Extremity ROM: within functional  limits              Sensory/Oculomotor Examination  Sensory  Visual Acuity: intact;wears glasses            Activities of Daily Living:   Self-care and Home Management  Eating: Supervision;Setup  Grooming: Supervision (comb hair)  Bathing: Moderate Assist  UB Dressing: Minimal Assist (/mod A)  LB Dressing: Moderate Assist  Toileting: Moderate Assist                                 Functional Mobility:   Mobility and Transfers  Supine to Sit: Moderate Assist  Sit to Stand: Minimal Assist (with front wheeled walker)  Bed to Chair: Minimal Assist (with front wheeled walker)      Participation and Activity Tolerance  Participation and Endurance  Participation Effort: good    Treatment Activities:       functional mobility, pt education- re precautions and Hope recommendations    Educated the patient to role of occupational therapy, plan of care, goals of therapy and safety with mobility and ADLs, spine precautions.    Individuals Educated :  (X in all that apply)           Patient x           Spouse/Significant Other            Family            Caregiver            Nursing Staff            Other (specify)    Response:             Verbalized Understanding x           Demonstrated Understanding            Questionable Understanding            No Understanding            Refused Education      Position at end of session: Client left in supine with call bell in reach.    Team Communication:   OT Team Communication: (please place "x" in all that apply)   Spoke c RN/CNA x   Spoke c RNCM/SW    Spoke c MD/NP/PA    Re: Pt position    Re: D/C needs    Re: Pt participation with Therapy x   Re: Vital Signs    Re: Further  Recommendations     Re: CPM settings    White board updated  x       Recommend client to be up with assist using walker outside of and in addition to OT session.            Donell Sievert OTR/L

## 2014-10-26 MED ORDER — HEPARIN SODIUM (PORCINE) PF 5000 UNIT/0.5ML IJ SOLN
5000.00 [IU] | Freq: Three times a day (TID) | INTRAMUSCULAR | Status: DC
Start: 2014-10-26 — End: 2016-09-29

## 2014-10-26 NOTE — Progress Notes (Signed)
Uneventful shift.  Pt slept very little due to worrying about discharge.  Pt stated that she's thinking "off all the things I have to do before I go to rehab."  Attempted to calm pt and ease her worries.  Pt now resting.  Pt only requiring pain meds once last night.  [place] J collar remains on.

## 2014-10-26 NOTE — Consults (Signed)
Social Work Initial Assessment  [facility]   [address]   [place] [address]       Consult received from/reason: RNCM - SNF needed    Admitting diagnosis: Cervical fracture    Therapy recommendation: SNF    Name/phone number of MPOA/caregiver with whom discharge plan was discussed: n/a    Choice sheet with facility listings provided: Yes    Preferences, if available: 1) [facility] 2) [employer] fact sheet provided:  Yes      Financial status (how it effects discharge plan):   - Over Assets for [employer] Yes  - Insurance coverage Yes - Medicare and commercial  - Med Assist Referral No  - FIS completed No    Possible barriers to discharge: bed availability    Possible transportation needed: stretcher    Social issues to be addressed/action taken:     Literacy (comments): yes    Patient verbalized understanding:  Yes    Consult discussed with:  __x__ NCM         ____ Therapy           ____ Bedside nurse      ____ MD      ____ UR      __x__ Patient        __x__ Other - CTT      Comments: Referral made to CTT. SW will continue to follow.    Marvia Pickles, MSW  Medical Social Worker  [phone]

## 2014-10-26 NOTE — Progress Notes (Signed)
NEUROSURGERY FLOOR PROGRESS NOTE    Date Time: 10/26/2014 7:12 AM  Patient Name: Kelli Brown    Attending: Antrell Tipler David [place], MD    Subjective:   Patient without compaint, doing well., no arm pain, no numbness             Measurements Exam:   Temp:  [97.5 F (36.4 C)-98.2 F (36.8 C)] 97.8 F (36.6 C)  Heart Rate:  [74-84] 74  Resp Rate:  [16-17] 16  BP: (106-156)/(45-75) 122/58 mmHg     Intake and Output Summary (Last 24 hours) at Date Time    Intake/Output Summary (Last 24 hours) at 10/26/14 1610  Last data filed at 10/26/14 0400   Gross per 24 hour   Intake    125 ml   Output    225 ml   Net   -100 ml           Neurological Exam:   MOTOR 5/5 IN UE BILAT, SENSORY INTACT IN UE BILAT and C-COLLAR IN PLACE    Labs:       Rads:         Assessment:   STABLE and CERVICAL SPINE FRACTURE      Plan:   OOB AMBULATE, PAIN CONTROL, ADAT, PT/OT CONSULTS, SUPPORTIVE CARE, MOBILIZE and Placement at [facility]. Dr. Ander Gaster to cover for me.      Signed by: Donelle Hise David [place], MD

## 2014-10-26 NOTE — PT Progress Note (Signed)
PT Progress Note  [facility]: [facility]  Department of Rehabilitation Services: [phone]  Mikahla Wisor Purdum    CSN: [id number]    NEURO SURGICAL   [id number]    Physical Therapy Treatment Note    Time of treatment:   Time Calculation  PT Received On: 10/26/14  Start Time: 9562  Stop Time: 0912  Time Calculation (min): 25 min    Visit#: 3    Last seen by Physical therapist on: 10/25/14    Medical Diagnosis/Pertinent medical/surgical details: C1 AND C2 FX    Precautions and Contraindications:  Spinal Precautions (no bending, lifting or twisting)  Falls      Assessment:   Patient's progress towards established goals: Pt making slow progress towards goals. She continues to require moderate assistance for getting out of bed. LE HEP was initiated today, and she required min A to perform.     Patient continues to have the following impairments: decreased activity tolerance, impaired motor control , decreased functional mobility, decreased balance, gait deficits, pain    Patient will continue to benefit from skilled PT services in order to PERFORM INDEPENDENT MOBILITY AND GAIT.         Goals:     LTGs: (By d/c)  1. Patient will perform bed mobility INDEPENDENDENTLY in prep for out of bed activity. Ongoing  2. Patient will perform sit to stand transfers with WHEELED WALKER INDEPENDENTLY in prep for gait. Ongoing   3. Patient will ambulate 400' feet with WHEELED WALKER INDEPENDENTLY in prep for home mobility. Ongoing  4. Patient will ascend/descend 29 step(s) with CANE and CG assist and 1 rail(s) in order to demonstrate ability to enter home. Ongoing    5. Pt will verbalize and demonstrate compliance with all spinal precautions in order to promote optimal healing and safe mobilization. Ongoing   6. Patient and/or caregiver will demonstrate independence with LE HEP in order to improve ROM and strength in prep for transfers and gait. Ongoing       Plan:   Treatment/interventions: Exercise, Gait training, Stair  training, Functional transfer training, Patient/caregiver training, Bed mobility    Treatment Frequency: 4-5x/wk    DISCHARGE RECOMMENDATIONS   DME recommended for Discharge:   Front wheeled walker    Discharge Recommendations:   SNF     Subjective:   Pt states that she would like to return to the chair at the end of the session so that it will be easier for her to eat.   Patient is agreeable to participation in the therapy session.     Pain:  At Rest: 3 /10  With Activity: 3/10  Location: headache  Interventions: None required    OBJECTIVE:   Observation of Patient/Vital Signs:   Patient is in bed with Miami J brace in place.  Patient's medical condition is appropriate for Physical therapy intervention at this time.    Vital Signs:  BP Supine:  128/52 mmHg  HR Supine: 77 bpm  SpO2 at rest: 94%    Oriented to: Oriented x4  Command following: Follows 1 step commands with increased time  Alertness/Arousal: Appropriate responses to stimuli   Attention Span:Appears intact  Memory: Decreased recall of precautions  Safety Awareness: minimal verbal instruction    Musculoskeletal and Balance Details:   Balance:  Static Sitting:  WFL  Dynamic Sitting:  Good  Static Standing:  Good  Dynamic Standing:  Good         Functional Mobility:   Bed Mobility:  Rolling to Left:  Minimal assist.   Cues for Log rolling.  Supine to Sit:   Moderate assist.   Cues for Sequencing.    Transfers:  Sit to Stand:  Supervision with Front wheeled walker.    Cues for hand placement.  Stand to Sit:  Supervision.    Cues for hand placement.    Locomotion:  LEVEL AMBULATION:  Distance: 300 ft   Assistance level:  Supervision  Device:  Front wheeled walker  Pattern:  Reciprocal, Narrow base of support, Decreased cadence, Decreased step length:  bilaterally, Decreased clearance:  bilaterally  Gait train details:cuing for upright posture, cuing for increased step length  Distance limited by: pt tolerance    Participation and Activity Tolerance    Participation effort: Good  Activity Tolerance: Tolerates 10-20 minutes exercise with multiple rests    Other Treatment Interventions this session:   Therapeutic exercise:  Supine: Ankle pump: 10   Quad sets: 10   Gluteal sets: 10  Heel slide: 10  Straight leg raise: 10  Sitting:  Long Arc Quads:  10   Marching:  10    All performed bilaterally   Therapeutic activity  Gait training     Education Provided:   TOPICS: role of physical therapy, plan of care, goals of therapy and safety with mobility and ADLs, benefits of activity, use of adaptive equipment, activity with nursing    Learner educated: Patient  Method: Explanation  Response to education: Verbalized understanding    Patient Position at End of Treatment:   Sitting, at the edge of the bed, Needs in reach, Bed/chair alarm set and No distress    Team Communication:   Spoke to : RN/LPN - Erin  Regarding: Pre-session re: patient status  Whiteboard updated: No  PT/PTA communication: via written note and verbal communication as needed.      Recommend patient up to chair for all meals, ambulate in hallway with FWW and supervision 2-3x/day outside of PT sessions.    Isaias Sakai, LPTA

## 2014-10-26 NOTE — Discharge Instr - Activity (Signed)
Patient is to wear C-Collar at all times

## 2014-10-26 NOTE — Progress Notes (Signed)
Social Work Discharge Note  [facility]   [address]   [place] [address]         Discharge disposition: SNF      SNF/ACUTE REHAB  DISCHARGE:    [facility]/Readmit needed:  No    Name of receiving facility: [facility]    Number to call report: [phone] room 210 B    Transport provider, type, time: VMT Social research officer, government at 4:30pm.    Scripts for controlled meds needed:  Yes    Complete [place] summary and send prior to discharge:  Yes        Name/phone number of MPOA/caregiver notified of discharge: n/a    Patient/family expressed understanding of discharge plan: Yes    Durable DNR and/or advance directive to be sent with patient:  Yes    [facility] choice sheet signed:     Marvia Pickles, MSW  Medical Social Worker  [phone]

## 2014-10-26 NOTE — Plan of Care (Signed)
Problem: Pain  Goal: Patient's pain/discomfort is manageable  Outcome: Adequate for Discharge    Problem: Impaired Mobility  Goal: Mobility/activity is maintained at optimum level for patient  Outcome: Progressing  Continue to ambulate with one assistance due to weakness/unsteady gait.  Mi[place] J collar to remain on at all times.  Reviewed proper log rolling technique with pt.

## 2014-10-26 NOTE — Progress Notes (Signed)
Report called to Trish at [facility]- denies further needs at this time- patient to be transported via VMT and stretcher at 1630.

## 2014-10-26 NOTE — Discharge Summary (Signed)
DISCHARGE SUMMARY    Date/Time: 10/26/2014 2:52 PM  Patient Name: Kelli Brown  Attending Physician: [place], Patrick David, *    Date of Admission:   10/23/2014    Date of Discharge:   10/26/2014    Reason for Admission:   Closed fracture of first cervical vertebra, unspecified fracture morphology, initial encounter [S12.000A]  Closed displaced fracture of second cervical vertebra, unspecified fracture morphology, initial encounter [S12.100A]    Procedure(s):   * No surgery found *      Hospital Course:   Please see admission and daily notes for further details.  Kelli Brown was admitted on 10/23/2014 .  She is a pleasant 80 year old female that fell backwards. She did not lose consciousness. She missed a step when she was coming down off the stepping stool. There was no syncope. She did have neck pain. She was evaluated in the emergency department and noted to have an odontoid type II fracture without any displacement, and a fracture of the anterior ring of C1. There appears to be a fracture in the posterior right lamina of C1 but this looked old. Patient has been neurologically intact. She is in a rigid cervical collar.  She had cervical x-rays which showed a stable fracture.  She has been mobilized with PT.  It is felt that she needs more skilled care and would be unable to return home at this time by herself.  She is therefore being discharged to a skilled nursing home, [place].       Discharge Medications:     Current Discharge Medication List      START taking these medications    Details   Heparin Sodium, Porcine, (HEPARIN, PORCINE, PF) 5000 UNIT/0.5ML Solution Inject 0.5 mLs (5,000 Units total) into the skin every 8 (eight) hours.         CONTINUE these medications which have NOT CHANGED    Details   dorzolamide (TRUSOPT) 2 % ophthalmic solution       furosemide (LASIX) 40 MG tablet       gabapentin (NEURONTIN) 100 MG capsule       KLOR-CON M 20 MEQ tablet       metoprolol XL (TOPROL-XL) 50 MG  24 hr tablet       pravastatin (PRAVACHOL) 40 MG tablet              Discharge Instructions:   Discharge instructions were given.  The patient is to followup with Dr. [place] in 6 weeks with ap/lateral cervical x-rays in the collar .  She is to wear her cervical collar 24/7.    Thurnell Lose, NP-C   Neurosurgery

## 2014-12-11 ENCOUNTER — Ambulatory Visit
Admission: RE | Admit: 2014-12-11 | Discharge: 2014-12-11 | Disposition: A | Discharge status: Home / Self Care | Payer: Medicare Other | Source: Ambulatory Visit | Attending: Nurse Practitioner | Admitting: Nurse Practitioner

## 2014-12-11 DIAGNOSIS — S12091D Other nondisplaced fracture of first cervical vertebra, subsequent encounter for fracture with routine healing: Secondary | ICD-10-CM | POA: Insufficient documentation

## 2014-12-11 DIAGNOSIS — S12091A Other nondisplaced fracture of first cervical vertebra, initial encounter for closed fracture: Secondary | ICD-10-CM

## 2014-12-13 ENCOUNTER — Other Ambulatory Visit: Payer: Self-pay | Admitting: Neurological Surgery

## 2014-12-13 DIAGNOSIS — S13131A Dislocation of C2/C3 cervical vertebrae, initial encounter: Secondary | ICD-10-CM

## 2014-12-13 DIAGNOSIS — S13121A Dislocation of C1/C2 cervical vertebrae, initial encounter: Secondary | ICD-10-CM

## 2015-01-24 ENCOUNTER — Ambulatory Visit
Admission: RE | Admit: 2015-01-24 | Discharge: 2015-01-24 | Disposition: A | Discharge status: Home / Self Care | Payer: Medicare Other | Source: Ambulatory Visit | Attending: Neurological Surgery | Admitting: Neurological Surgery

## 2015-01-24 ENCOUNTER — Other Ambulatory Visit: Payer: Self-pay | Admitting: Neurological Surgery

## 2015-01-24 DIAGNOSIS — S13121A Dislocation of C1/C2 cervical vertebrae, initial encounter: Secondary | ICD-10-CM

## 2015-01-24 DIAGNOSIS — M479 Spondylosis, unspecified: Secondary | ICD-10-CM | POA: Insufficient documentation

## 2015-01-24 DIAGNOSIS — S13131D Dislocation of C2/C3 cervical vertebrae, subsequent encounter: Secondary | ICD-10-CM

## 2015-01-24 DIAGNOSIS — M5032 Other cervical disc degeneration, mid-cervical region: Secondary | ICD-10-CM | POA: Insufficient documentation

## 2015-01-24 DIAGNOSIS — S12000A Unspecified displaced fracture of first cervical vertebra, initial encounter for closed fracture: Secondary | ICD-10-CM | POA: Insufficient documentation

## 2015-01-24 DIAGNOSIS — S13131A Dislocation of C2/C3 cervical vertebrae, initial encounter: Secondary | ICD-10-CM | POA: Insufficient documentation

## 2015-04-09 ENCOUNTER — Other Ambulatory Visit: Payer: Self-pay | Admitting: Neurological Surgery

## 2015-04-09 ENCOUNTER — Ambulatory Visit
Admission: RE | Admit: 2015-04-09 | Discharge: 2015-04-09 | Disposition: A | Discharge status: Home / Self Care | Payer: Medicare Other | Source: Ambulatory Visit | Attending: Neurological Surgery | Admitting: Neurological Surgery

## 2015-04-09 DIAGNOSIS — M5032 Other cervical disc degeneration, mid-cervical region: Secondary | ICD-10-CM | POA: Insufficient documentation

## 2015-04-09 DIAGNOSIS — S13131A Dislocation of C2/C3 cervical vertebrae, initial encounter: Secondary | ICD-10-CM

## 2015-04-09 DIAGNOSIS — S12100D Unspecified displaced fracture of second cervical vertebra, subsequent encounter for fracture with routine healing: Secondary | ICD-10-CM | POA: Insufficient documentation

## 2015-09-04 ENCOUNTER — Encounter
Admission: RE | Disposition: A | Discharge status: Home / Self Care | Payer: Self-pay | Source: Ambulatory Visit | Attending: Ophthalmology

## 2015-09-04 ENCOUNTER — Ambulatory Visit: Payer: Medicare Other | Admitting: Ophthalmology

## 2015-09-04 ENCOUNTER — Ambulatory Visit
Admission: RE | Admit: 2015-09-04 | Discharge: 2015-09-04 | Disposition: A | Discharge status: Home / Self Care | Payer: Medicare Other | Source: Ambulatory Visit | Attending: Ophthalmology | Admitting: Ophthalmology

## 2015-09-04 DIAGNOSIS — H3530 Unspecified macular degeneration: Secondary | ICD-10-CM | POA: Insufficient documentation

## 2015-09-04 DIAGNOSIS — Z9842 Cataract extraction status, left eye: Secondary | ICD-10-CM | POA: Insufficient documentation

## 2015-09-04 DIAGNOSIS — Z888 Allergy status to other drugs, medicaments and biological substances status: Secondary | ICD-10-CM | POA: Insufficient documentation

## 2015-09-04 DIAGNOSIS — E785 Hyperlipidemia, unspecified: Secondary | ICD-10-CM | POA: Insufficient documentation

## 2015-09-04 DIAGNOSIS — Z885 Allergy status to narcotic agent status: Secondary | ICD-10-CM | POA: Insufficient documentation

## 2015-09-04 DIAGNOSIS — Z9851 Tubal ligation status: Secondary | ICD-10-CM | POA: Insufficient documentation

## 2015-09-04 DIAGNOSIS — I10 Essential (primary) hypertension: Secondary | ICD-10-CM | POA: Insufficient documentation

## 2015-09-04 DIAGNOSIS — Z9841 Cataract extraction status, right eye: Secondary | ICD-10-CM | POA: Insufficient documentation

## 2015-09-04 DIAGNOSIS — Z9049 Acquired absence of other specified parts of digestive tract: Secondary | ICD-10-CM | POA: Insufficient documentation

## 2015-09-04 DIAGNOSIS — Z886 Allergy status to analgesic agent status: Secondary | ICD-10-CM | POA: Insufficient documentation

## 2015-09-04 DIAGNOSIS — M1990 Unspecified osteoarthritis, unspecified site: Secondary | ICD-10-CM | POA: Insufficient documentation

## 2015-09-04 DIAGNOSIS — H40052 Ocular hypertension, left eye: Secondary | ICD-10-CM | POA: Insufficient documentation

## 2015-09-04 SURGERY — LASER, CAPSULOTOMY, YAG
Anesthesia: Topical | Laterality: Left

## 2015-09-04 MED ORDER — PROPARACAINE HCL 0.5 % OP SOLN
OPHTHALMIC | Status: AC
Start: 2015-09-04 — End: ?
  Filled 2015-09-04: qty 15

## 2015-09-04 MED ORDER — PHENYLEPHRINE HCL 2.5 % OP SOLN
OPHTHALMIC | Status: AC
Start: 2015-09-04 — End: ?
  Filled 2015-09-04: qty 2

## 2015-09-04 MED ORDER — TETRACAINE HCL 0.5 % OP SOLN
1.0000 [drp] | Freq: Once | OPHTHALMIC | Status: DC
Start: 2015-09-04 — End: 2015-09-04
  Filled 2015-09-04: qty 2

## 2015-09-04 MED ORDER — PROPARACAINE HCL 0.5 % OP SOLN
1.0000 [drp] | Freq: Once | OPHTHALMIC | Status: AC
Start: 2015-09-04 — End: 2015-09-04
  Administered 2015-09-04: 1 [drp] via OPHTHALMIC

## 2015-09-04 MED ORDER — APRACLONIDINE HCL 1 % OP SOLN
1.0000 [drp] | Freq: Once | OPHTHALMIC | Status: AC
Start: 2015-09-04 — End: 2015-09-04
  Administered 2015-09-04: 1 [drp] via OPHTHALMIC

## 2015-09-04 MED ORDER — HYPROMELLOSE (GONIOSCOPIC) 2.5 % OP SOLN
1.0000 [drp] | OPHTHALMIC | Status: DC | PRN
Start: 2015-09-04 — End: 2015-09-04
  Administered 2015-09-04: 1 [drp] via OPHTHALMIC

## 2015-09-04 MED ORDER — APRACLONIDINE HCL 1 % OP SOLN
OPHTHALMIC | Status: AC
Start: 2015-09-04 — End: ?
  Filled 2015-09-04: qty 1

## 2015-09-04 MED ORDER — HYPROMELLOSE (GONIOSCOPIC) 2.5 % OP SOLN
OPHTHALMIC | Status: AC
Start: 2015-09-04 — End: ?
  Filled 2015-09-04: qty 15

## 2015-09-04 NOTE — Progress Notes (Signed)
Discharged to lobby ambulatory with cane.  Patient to return to Dr. Iran Sizer' office in 45 minutes for pressure check.

## 2015-09-04 NOTE — Op Note (Signed)
PREOPERATIVE DIAGNOSIS:Ocular hypertension left eye    POSTOPERATIVE DIAGNOSIS: Status post SLT, left eye.    PROCEDURE: SLT, left eye.    COMPLICATIONS: None.    ESTIMATED BLOOD LOSS: None.    ANESTHESIA: Topical.    INDICATION FOR SURGERY: The patient has ocular hypertension in the left eye, and having some trouble putting the drops in her eye. The risks, benefits and alternatives were explained  to the patient. The patient decides to proceed with the laser  surgery.    DESCRIPTION OF PROCEDURE: After the laser was tested in the  laser suite, the patient was brought into the laser room.  Tetracaine drops were applied to the eye. Iopidine drops were  applied to the eye. SLT LAtino lens was  used. Goniosol was applied and the lens was applied position on the  cornea. Laser was applied to TM, was limited axes superior Total amount of shots = 163  at 0.5 mJ energy.. Latino lens was removed from the cornea. The eye was irrigated with BSS.  Patient tolerated procedure well and she was discharged home with  instructions to follow up with Dr. Sheralyn Boatman in 45 minutes in her office.

## 2015-09-04 NOTE — H&P (Signed)
ADMISSION HISTORY AND PHYSICAL EXAM    Date Time: 09/04/2015 1:56 PM  Patient Name: Kelli Brown  Attending Physician: No att. providers found    Assessment:   Ocular hypertension left eye    Plan:   SLT OS    History of Present Illness:   Kelli Brown is a 81 y.o. female who presents to the hospital for SLT OS.      Past Medical History:     Past Medical History   Diagnosis Date   . Hypertension    . Shingles    . Macular degeneration    . Hyperlipidemia    . Arthritis    . Low back pain    . Abnormal vision        Past Surgical History:     Past Surgical History   Procedure Laterality Date   . Appendectomy     . Tonsillectomy     . Hip surgery Bilateral      Tubes tied   . Eye surgery       cateracts       Family History:   History reviewed. No pertinent family history.    Social History:     Social History     Social History   . Marital Status: Widowed     Spouse Name: N/A   . Number of Children: N/A   . Years of Education: N/A     Social History Main Topics   . Smoking status: Never Smoker    . Smokeless tobacco: Not on file   . Alcohol Use: No   . Drug Use: No   . Sexual Activity: Not Currently     Birth Control/ Protection: Post-menopausal     Other Topics Concern   . Not on file     Social History Narrative       Allergies:     Allergies   Allergen Reactions   . Codeine Nausea And Vomiting and Other (See Comments)     LIGHTHEADEDNESS.   . Darvon [Propoxyphene] Nausea And Vomiting and Other (See Comments)     LIGHTHEADEDNESS.   Marland Kitchen Phenobarbital Swelling and Rash       Medications:     No prescriptions prior to admission       Review of Systems:   A comprehensive review of systems was: Negative except elevated IOP    Physical Exam:     Filed Vitals:    09/04/15 1235   BP: 138/71   Pulse: 90   Temp: 97.7 F (36.5 C)   Resp: 16   SpO2: 100%       Intake and Output Summary (Last 24 hours) at Date Time  No intake or output data in the 24 hours ending 09/04/15 1356    General appearance - alert, well  appearing, and in no distress  Mental status - alert, oriented to person, place, and time  Eyes - pupils equal and reactive, extraocular eye movements intact  Chest - clear to auscultation, no wheezes, rales or rhonchi, symmetric air entry  Heart - normal rate, regular rhythm, normal S1, S2, no murmurs, rubs, clicks or gallops  Abdomen - soft, nontender, nondistended, no masses or organomegaly  Skin - normal coloration and turgor, no rashes, no suspicious skin lesions noted      Signed by: Andrew Au Golovkina-Hynes          I hereby certify this patient for hospitalization based upon medical necessity as noted above.

## 2015-10-28 ENCOUNTER — Other Ambulatory Visit: Payer: Self-pay | Admitting: Neurological Surgery

## 2015-10-28 ENCOUNTER — Ambulatory Visit
Admission: RE | Admit: 2015-10-28 | Discharge: 2015-10-28 | Disposition: A | Discharge status: Home / Self Care | Payer: Medicare Other | Source: Ambulatory Visit | Attending: Neurological Surgery | Admitting: Neurological Surgery

## 2015-10-28 DIAGNOSIS — R51 Headache: Secondary | ICD-10-CM | POA: Insufficient documentation

## 2015-10-28 DIAGNOSIS — S12100A Unspecified displaced fracture of second cervical vertebra, initial encounter for closed fracture: Secondary | ICD-10-CM | POA: Insufficient documentation

## 2015-10-28 DIAGNOSIS — S13131A Dislocation of C2/C3 cervical vertebrae, initial encounter: Secondary | ICD-10-CM

## 2016-07-10 ENCOUNTER — Encounter: Payer: Self-pay | Admitting: Physician Assistant

## 2016-07-10 DIAGNOSIS — M47896 Other spondylosis, lumbar region: Secondary | ICD-10-CM

## 2016-07-10 DIAGNOSIS — M461 Sacroiliitis, not elsewhere classified: Secondary | ICD-10-CM

## 2016-07-13 ENCOUNTER — Ambulatory Visit
Admission: RE | Admit: 2016-07-13 | Discharge: 2016-07-13 | Disposition: A | Discharge status: Home / Self Care | Payer: Medicare Other | Source: Ambulatory Visit | Attending: Physician Assistant | Admitting: Physician Assistant

## 2016-07-13 DIAGNOSIS — M47896 Other spondylosis, lumbar region: Secondary | ICD-10-CM

## 2016-07-13 DIAGNOSIS — M5117 Intervertebral disc disorders with radiculopathy, lumbosacral region: Secondary | ICD-10-CM | POA: Insufficient documentation

## 2016-07-13 DIAGNOSIS — M4806 Spinal stenosis, lumbar region: Secondary | ICD-10-CM | POA: Insufficient documentation

## 2016-07-13 DIAGNOSIS — M461 Sacroiliitis, not elsewhere classified: Secondary | ICD-10-CM

## 2016-07-13 DIAGNOSIS — M4186 Other forms of scoliosis, lumbar region: Secondary | ICD-10-CM | POA: Insufficient documentation

## 2016-09-22 ENCOUNTER — Encounter (INDEPENDENT_AMBULATORY_CARE_PROVIDER_SITE_OTHER): Payer: Self-pay | Admitting: INTERNAL MEDICINE - CARDIOVASCULAR DISEASE

## 2016-09-22 NOTE — Progress Notes (Deleted)
[facility] [place]  [address], [address], [place]  [place] [id number]  [phone]    Date: 09/22/2016  Patient Name: Makayla Allen  MRN#: [id number]  DOB: 05-29-1919    Provider: Venita Lickuna Bhattacharya, MD  PCP: No primary care provider on file.    History:   Makayla Allen is a 82 y.o. female with a history of pulmonary HTN. She was last seen 03/23/16 with no complaints and no changes were made. Today she    Past Medical History:       Past Surgical History:       Allergies:   Allergies not on file    Medications:     Prior to Admission medications    Not on File       Family History:     Family Medical History     None              Social History:     Social History     Social History    Marital status: N/A     Spouse name: N/A    Number of children: N/A    Years of education: N/A     Occupational History    Not on file.     Social History Main Topics    Smoking status: Not on file    Smokeless tobacco: Not on file    Alcohol use Not on file    Drug use: Not on file    Sexual activity: Not on file     Other Topics Concern    Not on file     Social History Narrative       Review of Systems:     All systems were reviewed and are negative other than noted in the HPI.    Physical Exam:   There were no vitals taken for this visit.      Cardiovascular Workup:   Echo 11/02/13  EF 60-65%. Grade I DD.  Mild AS with trace AI.  Mild Mitrial stenosis with trace MI.  Mild TR.  RVSP 44 mmHg    AAA screening 02/21/14  Negative for AAA    Carotid duplex 02/21/14   RICA & LICA <50% stenosis    ABI 05/10/14  No evidence of arterial insufficiency b/l by resting and exercise ABI  Toe pressure consistent with healing b/l    Nuclear Stress Test 09/18/14  EF 65%  Normal tracer uptake  Low risk study      Assessment:     Makayla Allen is 82 y.o. female with    Chest pain  Fatigue  HTN  Pulmonary HTN  LE edema  Numbness in fingers    Plan:

## 2016-09-23 ENCOUNTER — Encounter (INDEPENDENT_AMBULATORY_CARE_PROVIDER_SITE_OTHER): Payer: Self-pay | Admitting: INTERNAL MEDICINE - CARDIOVASCULAR DISEASE

## 2016-09-23 ENCOUNTER — Ambulatory Visit (INDEPENDENT_AMBULATORY_CARE_PROVIDER_SITE_OTHER): Payer: Medicare Other | Admitting: INTERNAL MEDICINE - CARDIOVASCULAR DISEASE

## 2016-09-23 DIAGNOSIS — M545 Low back pain, unspecified: Secondary | ICD-10-CM | POA: Insufficient documentation

## 2016-09-23 DIAGNOSIS — G8929 Other chronic pain: Secondary | ICD-10-CM | POA: Insufficient documentation

## 2016-09-23 NOTE — Progress Notes (Addendum)
[facility] [place]  [address], [address], [place]  [place] [id number]  [phone]    Date: 09/23/2016  Patient Name: Makayla Allen  MRN#: [id number]  DOB: 1919-11-10    Provider: Venita Lickuna Amond Speranza, MD  PCP: Tylene FantasiaJoel Grant, MD    History:   Makayla Allen is a 82 y.o. female with a history of pulmonary HTN. She was last seen 03/23/16 with no complaints and no changes were made. Today she states she is having back pain. She denies any hospitalizations, ED visits, and medication changes. She states she is well from and cardiac standpoint. She states she will be seeing a pain and management doctor for her back pain. She states it is hard for her to take her medications, because meals are only served at a specific time.    Past Medical History:   No past medical history on file.    Past Surgical History:     Past Surgical History:   Procedure Laterality Date    CATARACT EXTRACTION BILATERAL W/ ANTERIOR VITRECTOMY      HIP ARTHROPLASTY      HX APPENDECTOMY      HX TONSILLECTOMY      HX TUBAL LIGATION         Allergies:     Allergies   Allergen Reactions    Codeine     Phenobarbital        Medications:     Prior to Admission medications    Not on File       Family History:     Family Medical History     None              Social History:     Social History     Social History    Marital status: Widowed     Spouse name: N/A    Number of children: N/A    Years of education: N/A     Occupational History    Not on file.     Social History Main Topics    Smoking status: Never Smoker    Smokeless tobacco: Never Used    Alcohol use No    Drug use: Not on file    Sexual activity: Not on file     Other Topics Concern    Not on file     Social History Narrative    No narrative on file       Review of Systems:     All systems were reviewed and are negative other than noted in the HPI.    Physical Exam:   Blood pressure (!) 150/76, pulse 96, height 1.524 m (5'), weight 74.2 kg (163 lb 9.6  oz), SpO2 98 %.      Cardiovascular Workup:   Echo 11/02/13  EF 60-65%. Grade I DD.  Mild AS with trace AI.  Mild Mitrial stenosis with trace MI.  Mild TR.  RVSP 44 mmHg    AAA screening 02/21/14  Negative for AAA    Carotid duplex 02/21/14   RICA & LICA <50% stenosis    ABI 05/10/14  No evidence of arterial insufficiency b/l by resting and exercise ABI  Toe pressure consistent with healing b/l    Nuclear Stress Test 09/18/14  EF 65%  Normal tracer uptake  Low risk study      Assessment:     Makayla Allen is 82 y.o. woman with    Chest pain  Fatigue  HTN-  controlled  Pulmonary HTN  LE edema - LE edema on PE  Numbness in fingers  General: Back pain- pt will be seeing a pain and management doctor  Plan:   Continue Lasix, ASA, BB, and statin    Follow up in 6 months

## 2016-09-29 ENCOUNTER — Encounter (INDEPENDENT_AMBULATORY_CARE_PROVIDER_SITE_OTHER): Payer: Self-pay | Admitting: Physical Medicine & Rehabilitation

## 2016-09-29 ENCOUNTER — Ambulatory Visit (INDEPENDENT_AMBULATORY_CARE_PROVIDER_SITE_OTHER): Payer: Medicare Other | Admitting: Physical Medicine & Rehabilitation

## 2016-09-29 VITALS — BP 123/69 | HR 72 | Ht 60.0 in | Wt 164.0 lb

## 2016-09-29 DIAGNOSIS — Z8739 Personal history of other diseases of the musculoskeletal system and connective tissue: Secondary | ICD-10-CM

## 2016-09-29 DIAGNOSIS — M545 Low back pain: Secondary | ICD-10-CM

## 2016-09-29 DIAGNOSIS — M79661 Pain in right lower leg: Secondary | ICD-10-CM

## 2016-09-29 DIAGNOSIS — M5137 Other intervertebral disc degeneration, lumbosacral region: Secondary | ICD-10-CM

## 2016-09-29 DIAGNOSIS — M5417 Radiculopathy, lumbosacral region: Secondary | ICD-10-CM

## 2016-09-29 DIAGNOSIS — M79662 Pain in left lower leg: Secondary | ICD-10-CM

## 2016-09-29 DIAGNOSIS — M47817 Spondylosis without myelopathy or radiculopathy, lumbosacral region: Secondary | ICD-10-CM

## 2016-09-29 NOTE — Progress Notes (Signed)
New Patient Evaluation      Patient Name:                                                 Kelli Brown, Kelli Brown  DOB  12-21-1918  MRN  [id number]    Referring Physician: Flossie Dibble, MD      History of Present Illness:    As you know, Kelli Brown  is a pleasant 82 y.o., female with the chief complaint of axial low back pain located in the midline and bilateral paramidline lumbosacral region and less severe, bilateral, left greater than right, lateral thigh.  The pain is 8 out of 10 in severity, and aching, dull in quality.  Onset gradual, 15 years duration, worsening over the past 2 months.  Exacerbating factors include standing and walking.  Alleviating factors include sitting . Associated symptoms are denied by the patient.     Prior Treatments: Physical therapy (current), epidural steroid injections    Prior Medications: Gabapentin, PRN Tylenol, PRN Hydrocodone    Current Medications:    Current Outpatient Prescriptions:   .  aspirin EC 81 MG EC tablet, Take 81 mg by mouth daily., Disp: , Rfl:   .  Calcium Carb-Cholecalciferol 600-500 MG-UNIT Cap, Take 1 capsule by mouth daily., Disp: , Rfl:   .  Cholecalciferol (VITAMIN D) 1000 UNIT tablet, Take 1,000 Units by mouth daily., Disp: , Rfl:   .  dorzolamide (TRUSOPT) 2 % ophthalmic solution, Place 1 drop into the left eye 3 (three) times daily.  , Disp: , Rfl:   .  furosemide (LASIX) 40 MG tablet, Take 40 mg by mouth Every other day, Disp: , Rfl:   .  KLOR-CON M 20 MEQ tablet, Take 20 mEq by mouth daily.  , Disp: , Rfl:   .  metoprolol XL (TOPROL-XL) 50 MG 24 hr tablet, Take 50 mg by mouth daily.  , Disp: , Rfl:   .  pravastatin (PRAVACHOL) 40 MG tablet, Take 40 mg by mouth daily.  , Disp: , Rfl:     Allergies:  Allergies   Allergen Reactions   . Codeine Nausea And Vomiting and Other (See Comments)     LIGHTHEADEDNESS.   . Darvon [Propoxyphene] Nausea And Vomiting and Other (See Comments)     LIGHTHEADEDNESS.   Marland Kitchen Phenobarbital Swelling and Rash       Past  Medical History:  Past Medical History:   Diagnosis Date   . Abnormal vision    . Arthritis    . Hyperlipidemia    . Hypertension    . Low back pain    . Macular degeneration    . Shingles        Past Surgical History:  Past Surgical History:   Procedure Laterality Date   . APPENDECTOMY     . EYE SURGERY      cateracts   . HIP SURGERY Bilateral     Tubes tied   . TONSILLECTOMY     . TUBAL LIGATION         Family History:  No family history on file.    Social History:  Social History     Social History   . Marital status: Widowed     Spouse name: N/A   . Number of children: N/A   . Years of education: N/A  Occupational History   . Not on file.     Social History Main Topics   . Smoking status: Never Smoker   . Smokeless tobacco: Not on file   . Alcohol use No   . Drug use: No   . Sexual activity: Not Currently     Birth control/ protection: Post-menopausal     Other Topics Concern   . Not on file     Social History Narrative   . No narrative on file         Review Of Systems:    Constitutional:  Fatigue  Neurological:  Negative  Musculoskeletal:  Negative  HEENT:  Vision changes  Cardiovascular:  Shortness of breath  Respiratory: Shortness of breath  Gastrointestinal:  Negative  Genitourinary:  Frequent urination  Skin: Negative  Psychiatric: Negative  Female: Negative    Physical Examination:    Vitals:    09/29/16 0934   BP: 123/69   Pulse: 72   SpO2: 97%     Constitutional: The patient is oriented to person, place and time with normal mood and affect. Ambulation mildly antalgic with rolling walker for community and household ambulation  Skin: unremarkable  Lymph nodes: Inspection for gross deformities is negative  Girth: Upper and lower limbs are symmetric  Pulses: Peripheral pulses are intact distally in the bilateral upper and lower limbs  Palpation: There is no pain on palpation of the cervical, thoracic, lumbosacral spine  Joints:  Functional range of motion in the bilateral upper and lower limbs, neck, and  trunk with no evidence of instability or effusion.  Spine: Sustained Hip Flexion is positive, SLR on the left is negative, negative Braggard's sign, Lumbar range of motion mildly restricted.   Neurologic:  Manual muscle testing 5/5 in the bilateral upper and lower limbs.  Muscle stretch reflexes 2+ in the bilateral upper and lower limbs.  Sensation intact to light touch in the bilateral upper and lower limbs.  Upper motor neuron signs negative in the bilateral upper and lower limbs.  Toes are down-going bilaterally, no clonus at the ankle bilaterally, Hoffman's sign negative bilaterally.      Lab Review:  No results found for this or any previous visit (from the past 240 hour(s)).      Radiology Review:  Magnetic resonance imaging of the lumbosacral spine dated:  07/13/16  L5S1 mod  desiccation, 25-50% loss of disc ht, grade 1 retrolisthesis, small broad based with left eccentric disc protrusion, mild left lateral recess stenosis, mild left and severe right L5 foraminal stenosis,   L45 mod desiccation, 25-50 % loss of disc ht, type I modic endplate changes, grade 1 anterolisthesis, broad based protrusion, bilateral FJA with LF buckling, mod CCS, mild/mod bilateral L4 foraminal stenosis   L34 mod desiccation, 25-50 % loss of disc ht, broad based protrusion, bilateral FJA with LF buckling, mild/mod CCS, mod bilateral L3 foraminal stenosis   L23 mod desiccation, < 25 % loss of disc ht      ODI: 31    Impression:  1. Axial lower back pain c/w IDD at L5S1 vs L45 vs L34 vs L5S1 vs L45 vs L34 painful facet joint arthrosis.   2. Bilateral, L>R, lower limb pain c/w somatic referral from #1 vs L5 vs S1 vs less likely, L4 radicular pain due to LSS.  3. h/o osteoporosis - on Vit D supplementation.      Plan:  1. The patient will undergo flexion-extension lumbosacral xrays to evaluate intervertebral disc height, alignment  and to assess for instability. We will obtain recent AP/Lateral lumbosacral xray report from [facility] facility.   2. The patient was prescribed spine focused physical therapy to improve biomechanics and reduce strain on the spine during recreational and occupational activities.  3. Continue PRN APAP. Patient will trial OTC Aspercreme/4% lidocaine PRN.   4. Will obtain prior procedure records from Dr. Tresea Mall ([facility]).   5. If no sustained relief, will move forward with 2-4 bilateral S1 vs multilevel transforaminal epidural steroid injections.        Landry Mellow, DO    [facility] Interventional Spine    Thank you for allowing me to be involved in the care of this patient.  If you have any questions please contact me at [facility] at [phone]    CC: Referring Provider, Roland Earl, MD

## 2016-10-02 NOTE — Progress Notes (Signed)
I am scribing for, and in the presence of, Dr. Venita Lickuna Zyara Riling for services provided on 09/23/2016.  Lowanda FosterAshley Patrick, SCRIBE   Lowanda FosterAshley Patrick, SCRIBE  10/02/2016, 13:36    I personally performed the services described in this documentation, as scribed in my presence, and it is both accurate and complete.    Venita Lickuna Jazir Newey, MD    Venita Lickuna Mahealani Sulak, MD  10/02/2016, 13:49

## 2016-10-07 ENCOUNTER — Ambulatory Visit
Admission: RE | Admit: 2016-10-07 | Discharge: 2016-10-07 | Disposition: A | Discharge status: Home / Self Care | Payer: Medicare Other | Source: Ambulatory Visit | Attending: Physical Medicine & Rehabilitation | Admitting: Physical Medicine & Rehabilitation

## 2016-10-07 DIAGNOSIS — M545 Low back pain: Secondary | ICD-10-CM | POA: Insufficient documentation

## 2016-10-07 DIAGNOSIS — M47817 Spondylosis without myelopathy or radiculopathy, lumbosacral region: Secondary | ICD-10-CM

## 2016-10-07 DIAGNOSIS — M5417 Radiculopathy, lumbosacral region: Secondary | ICD-10-CM

## 2016-10-07 DIAGNOSIS — M5137 Other intervertebral disc degeneration, lumbosacral region: Secondary | ICD-10-CM

## 2016-10-20 ENCOUNTER — Ambulatory Visit (INDEPENDENT_AMBULATORY_CARE_PROVIDER_SITE_OTHER): Payer: Medicare Other | Admitting: Physical Medicine & Rehabilitation

## 2016-10-20 ENCOUNTER — Encounter (INDEPENDENT_AMBULATORY_CARE_PROVIDER_SITE_OTHER): Payer: Self-pay | Admitting: Physical Medicine & Rehabilitation

## 2016-10-20 VITALS — BP 141/60 | HR 97 | Wt 164.0 lb

## 2016-10-20 DIAGNOSIS — M5417 Radiculopathy, lumbosacral region: Secondary | ICD-10-CM

## 2016-10-20 DIAGNOSIS — M79661 Pain in right lower leg: Secondary | ICD-10-CM

## 2016-10-20 DIAGNOSIS — M5137 Other intervertebral disc degeneration, lumbosacral region: Secondary | ICD-10-CM

## 2016-10-20 DIAGNOSIS — Z8739 Personal history of other diseases of the musculoskeletal system and connective tissue: Secondary | ICD-10-CM

## 2016-10-20 DIAGNOSIS — M79662 Pain in left lower leg: Secondary | ICD-10-CM

## 2016-10-20 DIAGNOSIS — M545 Low back pain: Secondary | ICD-10-CM

## 2016-10-20 NOTE — Progress Notes (Signed)
Follow-up visit      Patient Name:                                                 Kelli Brown, Kelli Brown  DOB  1919-07-06  MRN  [id number]    Referring Physician:        History of Present Illness:    As you know, Kelli Brown  is a pleasant 82 y.o., female with the chief complaint of axial low back pain located in the midline and bilateral, right greater than left, paramidline lumbosacral region and less severe, bilateral, left greater than right, lateral thigh.  The pain is 8 out of 10 in severity, and aching, dull in quality.  Onset gradual, 15 years duration, worsening over the past 2 months.  Exacerbating factors include standing and walking.  Alleviating factors include sitting . Associated symptoms are denied by the patient.     Today the patient reports stable and unchanged index lower back pain despite physical therapy. She reports persistent index lower back pain limiting function and QoL.     Prior Treatments: Physical therapy (current), epidural steroid injections    Prior Medications: Gabapentin, PRN Tylenol, PRN Hydrocodone    Current Medications:    Current Outpatient Prescriptions:   .  aspirin EC 81 MG EC tablet, Take 81 mg by mouth daily., Disp: , Rfl:   .  Calcium Carb-Cholecalciferol 600-500 MG-UNIT Cap, Take 1 capsule by mouth daily., Disp: , Rfl:   .  Cholecalciferol (VITAMIN D) 1000 UNIT tablet, Take 1,000 Units by mouth daily., Disp: , Rfl:   .  dorzolamide (TRUSOPT) 2 % ophthalmic solution, Place 1 drop into the left eye 3 (three) times daily.  , Disp: , Rfl:   .  furosemide (LASIX) 40 MG tablet, Take 40 mg by mouth Every other day, Disp: , Rfl:   .  KLOR-CON M 20 MEQ tablet, Take 20 mEq by mouth daily.  , Disp: , Rfl:   .  lidocaine (ASPERCREME W/LIDOCAINE) 4 % cream, Apply topically 4 (four) times daily as needed., Disp: , Rfl:   .  metoprolol XL (TOPROL-XL) 50 MG 24 hr tablet, Take 50 mg by mouth daily.  , Disp: , Rfl:   .  pravastatin (PRAVACHOL) 40 MG tablet, Take 40 mg by mouth  daily.  , Disp: , Rfl:     Allergies:  Allergies   Allergen Reactions   . Codeine Nausea And Vomiting and Other (See Comments)     LIGHTHEADEDNESS.   . Darvon [Propoxyphene] Nausea And Vomiting and Other (See Comments)     LIGHTHEADEDNESS.   Marland Kitchen Phenobarbital Swelling and Rash       Past Medical History:  Past Medical History:   Diagnosis Date   . Abnormal vision    . Arthritis    . Hyperlipidemia    . Hypertension    . Low back pain    . Macular degeneration    . Shingles        Past Surgical History:  Past Surgical History:   Procedure Laterality Date   . APPENDECTOMY     . EYE SURGERY      cateracts   . HIP SURGERY Bilateral     Tubes tied   . TONSILLECTOMY     . TUBAL LIGATION  Family History:  No family history on file.    Social History:  Social History     Social History   . Marital status: Widowed     Spouse name: N/A   . Number of children: N/A   . Years of education: N/A     Occupational History   . Not on file.     Social History Main Topics   . Smoking status: Never Smoker   . Smokeless tobacco: Not on file   . Alcohol use No   . Drug use: No   . Sexual activity: Not Currently     Birth control/ protection: Post-menopausal     Other Topics Concern   . Not on file     Social History Narrative   . No narrative on file         Review Of Systems:    Constitutional:  Fatigue  Neurological:  Negative  Musculoskeletal:  Negative  HEENT:  Vision changes  Cardiovascular:  Shortness of breath  Respiratory: Shortness of breath  Gastrointestinal:  Negative  Genitourinary:  Frequent urination  Skin: Negative  Psychiatric: Negative  Female: Negative    Physical Examination:    There were no vitals filed for this visit.  Constitutional: The patient is oriented to person, place and time with normal mood and affect. Ambulation mildly antalgic with rolling walker for community and household ambulation  Skin: unremarkable  Lymph nodes: Inspection for gross deformities is negative  Girth: Upper and lower limbs are  symmetric  Pulses: Peripheral pulses are intact distally in the bilateral upper and lower limbs  Palpation: There is no pain on palpation of the cervical, thoracic, lumbosacral spine  Joints:  Functional range of motion in the bilateral upper and lower limbs, neck, and trunk with no evidence of instability or effusion.  Spine: Sustained Hip Flexion is positive, SLR on the left is negative, negative Braggard's sign, Lumbar range of motion mildly restricted.   Neurologic:  Manual muscle testing 5/5 in the bilateral upper and lower limbs.  Muscle stretch reflexes 2+ in the bilateral upper and lower limbs.  Sensation intact to light touch in the bilateral upper and lower limbs.  Upper motor neuron signs negative in the bilateral upper and lower limbs.  Toes are down-going bilaterally, no clonus at the ankle bilaterally, Hoffman's sign negative bilaterally.      Lab Review:  No results found for this or any previous visit (from the past 240 hour(s)).      Radiology Review:  Magnetic resonance imaging of the lumbosacral spine dated:  07/13/16  L5S1 mod  desiccation, 25-50% loss of disc ht, grade 1 retrolisthesis, small broad based with left eccentric disc protrusion, mild left lateral recess stenosis, mild left and severe right L5 foraminal stenosis,   L45 mod desiccation, 25-50 % loss of disc ht, type I modic endplate changes, grade 1 anterolisthesis, broad based protrusion, bilateral FJA with LF buckling, mod CCS, mild/mod bilateral L4 foraminal stenosis   L34 mod desiccation, 25-50 % loss of disc ht, broad based protrusion, bilateral FJA with LF buckling, mild/mod CCS, mod bilateral L3 foraminal stenosis   L23 mod desiccation, < 25 % loss of disc ht    Flexion/extension lumbosacral xrays dated: 10/07/16  Loss of lumbar lordosis  Poor imaging quality limits interpretation  L5S1 50-75% loss of disc ht, grade 1 anterolisthesis   L45  50-75% loss of disc ht  L34 50-75% loss of disc ht  L23 25-50% loss of disc  ht  No  instability.         ODI: 26    Impression:  1. Axial lower back pain c/w IDD at L5S1 vs L45 vs L34 vs L5S1 vs L45 vs L34 painful facet joint arthrosis.   2. Bilateral, L>R, lower limb pain c/w somatic referral from #1 vs L5 vs S1 vs less likely, L4 radicular pain due to LSS.  3. h/o osteoporosis - on Vit D supplementation.      Plan:  1. We will re-request AP/Lateral lumbosacral xray report from [facility] facility.   2. Continue physical therapy and HEP.   3. Continue PRN APAP. Patient will trial OTC Aspercreme/4% lidocaine PRN.   4. We will move forward with 2-4 bilateral S1 vs multilevel transforaminal epidural steroid injections. If no sustained relief, would move forward with dx right L4 medial branch L5 dorsal ramus block and if pos, dx #2 and if pos, right L4 medial branch L5 dorsal ramus radiofrequency neurotomy.   5. Risks and benefits of the procedures explained to the patient as well as the use of off label corticosteroid in the epidural space.        Landry Mellow, DO    [facility] Interventional Spine      Thank you for allowing me to be involved in the care of this patient.  If you have any questions please contact me at [facility] at [phone]    CC: Referring Provider, Roland Earl, MD

## 2016-10-21 ENCOUNTER — Ambulatory Visit (INDEPENDENT_AMBULATORY_CARE_PROVIDER_SITE_OTHER): Payer: Medicare Other | Admitting: Physical Medicine & Rehabilitation

## 2016-10-21 ENCOUNTER — Encounter (INDEPENDENT_AMBULATORY_CARE_PROVIDER_SITE_OTHER): Payer: Self-pay | Admitting: Physical Medicine & Rehabilitation

## 2016-10-21 DIAGNOSIS — M545 Low back pain: Secondary | ICD-10-CM

## 2016-10-21 DIAGNOSIS — M5136 Other intervertebral disc degeneration, lumbar region: Secondary | ICD-10-CM

## 2016-10-21 DIAGNOSIS — M5137 Other intervertebral disc degeneration, lumbosacral region: Secondary | ICD-10-CM

## 2016-10-21 MED ORDER — TRIAMCINOLONE ACETONIDE 40 MG/ML IJ SUSP
80.0000 mg | Freq: Once | INTRAMUSCULAR | Status: AC
Start: 2016-10-21 — End: 2016-10-21
  Administered 2016-10-21: 80 mg

## 2016-10-21 MED ORDER — IOHEXOL 240 MG/ML IJ SOLN
50.0000 mL | Freq: Once | INTRAMUSCULAR | Status: AC
Start: 2016-10-21 — End: 2016-10-21
  Administered 2016-10-21: 50 mL

## 2016-10-21 NOTE — Procedures (Signed)
Transforaminal epidural steroid injection  Procedure performed:  Therapeutic (right) (S1) TFESI  Reason for procedure: axial low back pain, M51.36, M51.37  Surgeon: Dr. Leilene Diprima  PROCEDURE: After obtaining informed consent, the patient was placed in the prone position on the fluoroscopy table. A time out was held for patient verification. Cycled one minute blood pressure, pulse,and pulse oximetry was maintained. The lumbar skin was prepped and draped in a sterile fashion with Chloraprep and sterile towels. Conscious sedation was not administered. After raising a skin wheal with 1 percent Xylocaine and anesthetizing the subcutaneous tissues, a #22 gauge 3.5 inch spinal needle was placed under fluoroscopic control to the (right) (S1) foramen angled caudally.  Approximately 1-2 milliliters of Omnipaque 240 contrast dye was injected, demonstrating perineural and anterior epidural spread without vascular uptake in the anteroposterior and oblique views. There was opacification of the nerve root sheath with epidural reflux of contrast material extending into the anterior epidural space.  A total of 1 milliliter of 1 percent Xylocaine and 1.5mls of 40mg/ml kenalog was instilled around the (S1) nerve root. The patient tolerated the procedure well and was transitioned to the post procedure holding area uneventfully. The patient was monitored for adverse allergic, paralytic and hypertensive reactions. The patient was discharged without complication.    Transforaminal epidural steroid injection  Procedure performed:  Therapeutic (left) (S1) TFESI  Reason for procedure: axial low back pain  Surgeon: Dr. Stclair Szymborski  PROCEDURE: After obtaining informed consent, the patient was placed in the prone position on the fluoroscopy table. A time out was held for patient verification. Cycled one minute blood pressure, pulse, and pulse oximetry was maintained. The lumbar skin was prepped and draped in a sterile fashion with Chloraprep  and sterile towels. Conscious sedation was not administered. After raising a skin wheal with 1 percent Xylocaine and anesthetizing the subcutaneous tissues, a #22 gauge 3.5 inch spinal needle was placed under fluoroscopic control to the (left) (S1) foramen angled caudally.  1-2 milliliters of Omnipaque 240 contrast dye was injected, demonstrating perinerual and anterior epidural spread without vascular uptake in the anteroposterior and oblique views. There was opacification of the nerve root sheath with epidural reflux of contrast material extending into the anterior epidural space.  A total of 1 milliliter of 1 percent Xylocaine and 1.5mls of 40mg/ml kenalog was instilled around the (S1) nerve root. The patient tolerated the procedure well and was transitioned to the post procedure holding area uneventfully. The patient was monitored for adverse allergic, paralytic and hypertensive reactions. The patient was discharged without complication.

## 2016-11-04 ENCOUNTER — Ambulatory Visit (INDEPENDENT_AMBULATORY_CARE_PROVIDER_SITE_OTHER): Payer: Medicare Other | Admitting: Physical Medicine & Rehabilitation

## 2016-11-04 ENCOUNTER — Encounter (INDEPENDENT_AMBULATORY_CARE_PROVIDER_SITE_OTHER): Payer: Self-pay | Admitting: Physical Medicine & Rehabilitation

## 2016-11-04 DIAGNOSIS — M545 Low back pain: Secondary | ICD-10-CM

## 2016-11-04 DIAGNOSIS — M5137 Other intervertebral disc degeneration, lumbosacral region: Secondary | ICD-10-CM

## 2016-11-04 DIAGNOSIS — M5136 Other intervertebral disc degeneration, lumbar region: Secondary | ICD-10-CM

## 2016-11-04 MED ORDER — IOHEXOL 240 MG/ML IJ SOLN
50.0000 mL | Freq: Once | INTRAMUSCULAR | Status: AC
Start: 2016-11-04 — End: 2016-11-04
  Administered 2016-11-04: 50 mL

## 2016-11-04 MED ORDER — TRIAMCINOLONE ACETONIDE 40 MG/ML IJ SUSP
80.0000 mg | Freq: Once | INTRAMUSCULAR | Status: AC
Start: 2016-11-04 — End: 2016-11-04
  Administered 2016-11-04: 80 mg

## 2016-11-04 NOTE — Procedures (Signed)
Transforaminal epidural steroid injection  Procedure performed:  Therapeutic (right) (S1) TFESI  Reason for procedure: axial low back pain, M51.36, M51.37  Surgeon: Dr. Raysean Graumann  PROCEDURE: After obtaining informed consent, the patient was placed in the prone position on the fluoroscopy table. A time out was held for patient verification. Cycled one minute blood pressure, pulse,and pulse oximetry was maintained. The lumbar skin was prepped and draped in a sterile fashion with Chloraprep and sterile towels. Conscious sedation was not administered. After raising a skin wheal with 1 percent Xylocaine and anesthetizing the subcutaneous tissues, a #22 gauge 3.5 inch spinal needle was placed under fluoroscopic control to the (right) (S1) foramen angled caudally.  Approximately 1-2 milliliters of Omnipaque 240 contrast dye was injected, demonstrating perineural and anterior epidural spread without vascular uptake in the anteroposterior and oblique views. There was opacification of the nerve root sheath with epidural reflux of contrast material extending into the anterior epidural space.  A total of 1 milliliter of 1 percent Xylocaine and 1.5mls of 40mg/ml kenalog was instilled around the (S1) nerve root. The patient tolerated the procedure well and was transitioned to the post procedure holding area uneventfully. The patient was monitored for adverse allergic, paralytic and hypertensive reactions. The patient was discharged without complication.    Transforaminal epidural steroid injection  Procedure performed:  Therapeutic (left) (S1) TFESI  Reason for procedure: axial low back pain  Surgeon: Dr. Jamaal Bernasconi  PROCEDURE: After obtaining informed consent, the patient was placed in the prone position on the fluoroscopy table. A time out was held for patient verification. Cycled one minute blood pressure, pulse, and pulse oximetry was maintained. The lumbar skin was prepped and draped in a sterile fashion with Chloraprep  and sterile towels. Conscious sedation was not administered. After raising a skin wheal with 1 percent Xylocaine and anesthetizing the subcutaneous tissues, a #22 gauge 3.5 inch spinal needle was placed under fluoroscopic control to the (left) (S1) foramen angled caudally.  1-2 milliliters of Omnipaque 240 contrast dye was injected, demonstrating perinerual and anterior epidural spread without vascular uptake in the anteroposterior and oblique views. There was opacification of the nerve root sheath with epidural reflux of contrast material extending into the anterior epidural space.  A total of 1 milliliter of 1 percent Xylocaine and 1.5mls of 40mg/ml kenalog was instilled around the (S1) nerve root. The patient tolerated the procedure well and was transitioned to the post procedure holding area uneventfully. The patient was monitored for adverse allergic, paralytic and hypertensive reactions. The patient was discharged without complication.

## 2016-11-24 ENCOUNTER — Emergency Department: Payer: Medicare Other

## 2016-11-24 ENCOUNTER — Observation Stay: Payer: Medicare Other | Admitting: Emergency Medicine

## 2016-11-24 ENCOUNTER — Observation Stay
Admission: EM | Admit: 2016-11-24 | Discharge: 2016-11-25 | Disposition: A | Discharge status: Home / Self Care | Payer: Medicare Other | Attending: Emergency Medicine | Admitting: Emergency Medicine

## 2016-11-24 DIAGNOSIS — I10 Essential (primary) hypertension: Secondary | ICD-10-CM | POA: Insufficient documentation

## 2016-11-24 DIAGNOSIS — Z7982 Long term (current) use of aspirin: Secondary | ICD-10-CM | POA: Insufficient documentation

## 2016-11-24 DIAGNOSIS — R1310 Dysphagia, unspecified: Secondary | ICD-10-CM | POA: Insufficient documentation

## 2016-11-24 DIAGNOSIS — M1990 Unspecified osteoarthritis, unspecified site: Secondary | ICD-10-CM | POA: Insufficient documentation

## 2016-11-24 DIAGNOSIS — Z9851 Tubal ligation status: Secondary | ICD-10-CM | POA: Insufficient documentation

## 2016-11-24 DIAGNOSIS — M545 Low back pain: Secondary | ICD-10-CM | POA: Insufficient documentation

## 2016-11-24 DIAGNOSIS — Z9849 Cataract extraction status, unspecified eye: Secondary | ICD-10-CM | POA: Insufficient documentation

## 2016-11-24 DIAGNOSIS — H3530 Unspecified macular degeneration: Secondary | ICD-10-CM | POA: Insufficient documentation

## 2016-11-24 DIAGNOSIS — E785 Hyperlipidemia, unspecified: Secondary | ICD-10-CM | POA: Insufficient documentation

## 2016-11-24 DIAGNOSIS — R079 Chest pain, unspecified: Secondary | ICD-10-CM

## 2016-11-24 DIAGNOSIS — Z885 Allergy status to narcotic agent status: Secondary | ICD-10-CM | POA: Insufficient documentation

## 2016-11-24 DIAGNOSIS — Z9049 Acquired absence of other specified parts of digestive tract: Secondary | ICD-10-CM | POA: Insufficient documentation

## 2016-11-24 DIAGNOSIS — J449 Chronic obstructive pulmonary disease, unspecified: Secondary | ICD-10-CM | POA: Insufficient documentation

## 2016-11-24 DIAGNOSIS — R0789 Other chest pain: Principal | ICD-10-CM | POA: Insufficient documentation

## 2016-11-24 DIAGNOSIS — J8410 Pulmonary fibrosis, unspecified: Secondary | ICD-10-CM | POA: Insufficient documentation

## 2016-11-24 DIAGNOSIS — Z888 Allergy status to other drugs, medicaments and biological substances status: Secondary | ICD-10-CM | POA: Insufficient documentation

## 2016-11-24 LAB — I-STAT TROPONIN: Troponin I I-Stat: <0.02 ng/mL (ref 0.00–0.02)

## 2016-11-24 LAB — BASIC METABOLIC PANEL
Anion Gap: 12.8 mMol/L (ref 7.0–18.0)
BUN / Creatinine Ratio: 23.5 Ratio (ref 10.0–30.0)
BUN: 23 mg/dL — ABNORMAL HIGH (ref 7–22)
CO2: 29 mMol/L (ref 20.0–30.0)
Calcium: 9.6 mg/dL (ref 8.5–10.5)
Chloride: 106 mMol/L (ref 98–110)
Creatinine: 0.98 mg/dL (ref 0.60–1.20)
EGFR: 49 mL/min/{1.73_m2} — ABNORMAL LOW (ref 60–150)
Glucose: 108 mg/dL — ABNORMAL HIGH (ref 70–99)
Osmolality Calc: 291 mOsm/kg (ref 275–300)
Potassium: 3.8 mMol/L (ref 3.5–5.3)
Sodium: 144 mMol/L (ref 136–147)

## 2016-11-24 LAB — CBC AND DIFFERENTIAL
Basophils %: 0.5 % (ref 0.0–3.0)
Basophils Absolute: 0 10*3/uL (ref 0.0–0.3)
Eosinophils %: 0.9 % (ref 0.0–7.0)
Eosinophils Absolute: 0.1 10*3/uL (ref 0.0–0.8)
Hematocrit: 34.5 % — ABNORMAL LOW (ref 36.0–48.0)
Hemoglobin: 11.3 gm/dL — ABNORMAL LOW (ref 12.0–16.0)
Lymphocytes Absolute: 1.7 10*3/uL (ref 0.6–5.1)
Lymphocytes: 23 % (ref 15.0–46.0)
MCH: 33 pg (ref 28–35)
MCHC: 33 gm/dL (ref 32–36)
MCV: 101 fL — ABNORMAL HIGH (ref 80–100)
MPV: 7.6 fL (ref 6.0–10.0)
Monocytes Absolute: 0.6 10*3/uL (ref 0.1–1.7)
Monocytes: 8.6 % (ref 3.0–15.0)
Neutrophils %: 67 % (ref 42.0–78.0)
Neutrophils Absolute: 5 10*3/uL (ref 1.7–8.6)
PLT CT: 154 10*3/uL (ref 130–440)
RBC: 3.42 10*6/uL — ABNORMAL LOW (ref 3.80–5.00)
RDW: 18.3 % — ABNORMAL HIGH (ref 11.0–14.0)
WBC: 7.4 10*3/uL (ref 4.0–11.0)

## 2016-11-24 LAB — VH I-STAT TROPONIN NOTIFICATION

## 2016-11-24 LAB — ECG 12-LEAD
P Wave Axis: 52 deg
P-R Interval: 161 ms
Patient Age: 89 years
Q-T Interval(Corrected): 427 ms
Q-T Interval: 343 ms
QRS Axis: 17 deg
QRS Duration: 81 ms
T Axis: 38 years
Ventricular Rate: 93 //min

## 2016-11-24 LAB — TROPONIN I
Troponin I: 0.01 ng/mL (ref 0.00–0.02)
Troponin I: <0.01 ng/mL (ref 0.00–0.02)

## 2016-11-24 LAB — LIPID PANEL
Cholesterol: 191 mg/dL (ref 75–199)
Coronary Heart Disease Risk: 3.08
HDL: 62 mg/dL (ref 45–65)
LDL Calculated: 113 mg/dL
Triglycerides: 82 mg/dL (ref 10–150)
VLDL: 16 (ref 0–40)

## 2016-11-24 MED ORDER — NITROGLYCERIN 2 % TD OINT
1.0000 [in_us] | TOPICAL_OINTMENT | Freq: Once | TRANSDERMAL | Status: AC
Start: 2016-11-24 — End: 2016-11-24
  Administered 2016-11-24: 1 [in_us] via TOPICAL

## 2016-11-24 MED ORDER — DORZOLAMIDE HCL 2 % OP SOLN
1.0000 [drp] | Freq: Three times a day (TID) | OPHTHALMIC | Status: DC
Start: 2016-11-24 — End: 2016-11-25
  Administered 2016-11-24: 1 [drp] via OPHTHALMIC
  Filled 2016-11-24 (×2): qty 200

## 2016-11-24 MED ORDER — FUROSEMIDE 40 MG PO TABS
40.0000 mg | ORAL_TABLET | Freq: Every day | ORAL | Status: DC
Start: 2016-11-24 — End: 2016-11-25
  Filled 2016-11-24 (×2): qty 1

## 2016-11-24 MED ORDER — ONDANSETRON HCL 4 MG/2ML IJ SOLN
4.0000 mg | Freq: Three times a day (TID) | INTRAMUSCULAR | Status: DC | PRN
Start: 2016-11-24 — End: 2016-11-25

## 2016-11-24 MED ORDER — SODIUM CHLORIDE 0.9 % IJ SOLN
0.4000 mg | INTRAMUSCULAR | Status: DC | PRN
Start: 2016-11-24 — End: 2016-11-25

## 2016-11-24 MED ORDER — ASPIRIN 81 MG PO CHEW
324.0000 mg | CHEWABLE_TABLET | Freq: Once | ORAL | Status: AC
Start: 2016-11-24 — End: 2016-11-24
  Administered 2016-11-24: 324 mg via ORAL

## 2016-11-24 MED ORDER — NITROGLYCERIN 2 % TD OINT
TOPICAL_OINTMENT | TRANSDERMAL | Status: AC
Start: 2016-11-24 — End: ?
  Filled 2016-11-24: qty 1

## 2016-11-24 MED ORDER — ACETAMINOPHEN 325 MG PO TABS
650.0000 mg | ORAL_TABLET | ORAL | Status: DC | PRN
Start: 2016-11-24 — End: 2016-11-25

## 2016-11-24 MED ORDER — ACETAMINOPHEN 650 MG RE SUPP
650.0000 mg | RECTAL | Status: DC | PRN
Start: 2016-11-24 — End: 2016-11-25

## 2016-11-24 MED ORDER — DICYCLOMINE HCL 10 MG/5ML PO SOLN
Freq: Once | ORAL | Status: AC
Start: 2016-11-24 — End: 2016-11-24

## 2016-11-24 MED ORDER — ACETAMINOPHEN 325 MG RE SUPP
325.0000 mg | RECTAL | Status: DC | PRN
Start: 2016-11-24 — End: 2016-11-25

## 2016-11-24 MED ORDER — ASPIRIN 81 MG PO CHEW
81.0000 mg | CHEWABLE_TABLET | Freq: Every day | ORAL | Status: DC
Start: 2016-11-24 — End: 2016-11-25
  Administered 2016-11-24: 81 mg via ORAL
  Filled 2016-11-24 (×2): qty 1

## 2016-11-24 MED ORDER — DICYCLOMINE HCL 10 MG/5ML PO SOLN
ORAL | Status: AC
Start: 2016-11-24 — End: ?
  Filled 2016-11-24: qty 5

## 2016-11-24 MED ORDER — FENTANYL CITRATE (PF) 50 MCG/ML IJ SOLN (WRAP)
25.0000 ug | INTRAMUSCULAR | Status: DC | PRN
Start: 2016-11-24 — End: 2016-11-25
  Filled 2016-11-24: qty 2

## 2016-11-24 MED ORDER — LACTATED RINGERS IV SOLN
INTRAVENOUS | Status: DC
Start: 2016-11-24 — End: 2016-11-25

## 2016-11-24 MED ORDER — ENOXAPARIN SODIUM 40 MG/0.4ML SC SOLN
40.0000 mg | SUBCUTANEOUS | Status: DC
Start: 2016-11-24 — End: 2016-11-25
  Administered 2016-11-24: 40 mg via SUBCUTANEOUS
  Filled 2016-11-24 (×2): qty 0.4

## 2016-11-24 MED ORDER — MELOXICAM 7.5 MG PO TABS
7.5000 mg | ORAL_TABLET | Freq: Every day | ORAL | Status: DC
Start: 2016-11-24 — End: 2016-11-25
  Administered 2016-11-24: 7.5 mg via ORAL
  Filled 2016-11-24 (×2): qty 1

## 2016-11-24 MED ORDER — ASPIRIN 81 MG PO CHEW
CHEWABLE_TABLET | ORAL | Status: AC
Start: 2016-11-24 — End: ?
  Filled 2016-11-24: qty 4

## 2016-11-24 MED ORDER — ACETAMINOPHEN 160 MG/5ML PO SOLN
650.0000 mg | ORAL | Status: DC | PRN
Start: 2016-11-24 — End: 2016-11-25

## 2016-11-24 MED ORDER — LIDOCAINE VISCOUS 2 % MT SOLN
OROMUCOSAL | Status: AC
Start: 2016-11-24 — End: ?
  Filled 2016-11-24: qty 15

## 2016-11-24 MED ORDER — PRAVASTATIN SODIUM 40 MG PO TABS
40.0000 mg | ORAL_TABLET | Freq: Every day | ORAL | Status: DC
Start: 2016-11-24 — End: 2016-11-25
  Administered 2016-11-24: 40 mg via ORAL
  Filled 2016-11-24 (×2): qty 1

## 2016-11-24 MED ORDER — ACETAMINOPHEN 325 MG PO TABS
325.0000 mg | ORAL_TABLET | ORAL | Status: DC | PRN
Start: 2016-11-24 — End: 2016-11-25

## 2016-11-24 MED ORDER — METOPROLOL SUCCINATE ER 50 MG PO TB24
50.0000 mg | ORAL_TABLET | Freq: Every day | ORAL | Status: DC
Start: 2016-11-24 — End: 2016-11-25
  Administered 2016-11-24: 50 mg via ORAL
  Filled 2016-11-24 (×2): qty 1

## 2016-11-24 MED ORDER — ALUM & MAG HYDROXIDE-SIMETH 200-200-20 MG/5ML PO SUSP
ORAL | Status: AC
Start: 2016-11-24 — End: ?
  Filled 2016-11-24: qty 30

## 2016-11-24 MED ORDER — VH POTASSIUM CHLORIDE CRYS ER 20 MEQ PO TBCR (WRAP)
20.0000 meq | EXTENDED_RELEASE_TABLET | Freq: Every day | ORAL | Status: DC
Start: 2016-11-24 — End: 2016-11-25
  Administered 2016-11-24: 20 meq via ORAL
  Filled 2016-11-24 (×2): qty 1

## 2016-11-24 MED ORDER — ONDANSETRON 4 MG PO TBDP
4.0000 mg | ORAL_TABLET | Freq: Three times a day (TID) | ORAL | Status: DC | PRN
Start: 2016-11-24 — End: 2016-11-25

## 2016-11-24 NOTE — ED Triage Notes (Signed)
Pt brought in by EMS for Chest pain. It began yesterday in the middle of her chest, and has not subsided. She had nausea & vomiting yesterday, and is "spitting up" mucus today. A&ox4. Denies SOB.

## 2016-11-24 NOTE — H&P (Signed)
[facility] HISTORY AND PHYSICAL      Patient: Kelli Brown  Date: 11/24/2016   DOB: 01-05-1919  Admission Date: 11/24/2016   MRN: [id number]  Attending: Emeterio Reeve        No chief complaint on file.     History Gathered From: Self    HISTORY AND PHYSICAL     Kelli Brown is a 83 y.o. female with a PMHx of Hyperlipidemia, hypertension but no cardiac history who presented with chest discomfort after eating revealed cake last night. She has been coughing quite a bit since then. She was given aspirin here in the ED and she found that it was very uncomfortable with the burning sensation. She did have some cereal for breakfast this morning and was able to keep it down. She continues to cough up some "foam". Denies any problem with swallowing in the past. Denies any shortness of breath, diaphoresis or nausea.    Past Medical History:   Diagnosis Date   . Abnormal vision    . Arthritis    . Hyperlipidemia    . Hypertension    . Low back pain    . Macular degeneration    . Shingles        Past Surgical History:   Procedure Laterality Date   . APPENDECTOMY     . EYE SURGERY      cateracts   . HIP SURGERY Bilateral     Tubes tied   . TONSILLECTOMY     . TUBAL LIGATION         Prior to Admission medications    Medication Sig Start Date End Date Taking? Authorizing Provider   aspirin EC 81 MG EC tablet Take 81 mg by mouth daily.   Yes [provider]   calcium carbonate 1500 (600 Ca) MG Tab tablet Take 1,500 mg by mouth daily.       Yes [provider]   Cholecalciferol (VITAMIN D) 1000 UNIT tablet Take 1,000 Units by mouth daily.   Yes [provider]   dorzolamide (TRUSOPT) 2 % ophthalmic solution Place 1 drop into the left eye 3 (three) times daily.    09/24/14  Yes [provider]   furosemide (LASIX) 40 MG tablet Take 40 mg by mouth daily.   Yes [provider]   Jerene Dilling 20 MEQ tablet Take 20 mEq by mouth daily.    10/23/14  Yes [provider]    lidocaine (ASPERCREME W/LIDOCAINE) 4 % cream Apply topically 4 (four) times daily as needed.   Yes [provider]   meloxicam (MOBIC) 7.5 MG tablet Take 7.5 mg by mouth daily.   Yes [provider]   metoprolol XL (TOPROL-XL) 50 MG 24 hr tablet Take 50 mg by mouth daily.    10/08/14  Yes [provider]   Multiple Vitamins-Minerals (MULTIVITAMIN WITH MINERALS) tablet Take 1 tablet by mouth daily.   Yes [provider]   pravastatin (PRAVACHOL) 40 MG tablet Take 40 mg by mouth daily.    10/08/14  Yes [provider]   acetaminophen (TYLENOL) 325 MG tablet Take 650 mg by mouth.  11/24/16  [provider]   Calcium Carb-Cholecalciferol 600-500 MG-UNIT Cap Take 1 capsule by mouth daily.  11/24/16  [provider]   furosemide (LASIX) 40 MG tablet Take 40 mg by mouth Every other day 09/23/16 11/24/16  [provider]       Allergies   Allergen  Reactions   . Codeine Nausea And Vomiting and Other (See Comments)     LIGHTHEADEDNESS.   . Darvon [Propoxyphene] Nausea And Vomiting and Other (See Comments)     LIGHTHEADEDNESS.   Marland Kitchen Phenobarbital Swelling and Rash       CODE STATUS: partial code    PRIMARY CARE MD: Roland Earl, MD    History reviewed. No pertinent family history.    Social History   Substance Use Topics   . Smoking status: Never Smoker   . Smokeless tobacco: Never Used   . Alcohol use No       REVIEW OF SYSTEMS     Sixteen point review of systems negative or as per HPI and below endorsements.    ROS:  General: No fever or systemic symptoms Head: No headache or complaints, Eyes: History of macular degeneration, ENT: No complaints, no sleep apnea, Neck: No masses or pain, Chest/Pulmonary: Coughing up mucus since she ate the red velvet cake, CV: History of irregular heart, GI: No previous gastric intestinal issues, GU: No urinary tract symptoms, MS: Chronic back pain which recently has radiated down her legs since she has had  injections,Endocrine: No diabetes or thyroid disease, Heme: No blood clots or anemia, Neuro: No strokes, seizures, paresthesias or weaknesses, Integ: Denies any skin problems or rash, Allergic: Medication only, Psych: Denies depression or anxiety    SOCIAL PERSONAL HISTORY     Social History   Substance Use Topics   . Smoking status: Never Smoker   . Smokeless tobacco: Never Used   . Alcohol use No     Lives alone. Patient's 2 equal [employer] of [employer] live remotely, one in [place] in one to [place]  The folder from the healthcare facility states that she is a full code but there is no document. I called Dr. at [facility] office and they have no document. When I discussed with the patient resuscitation, it is clear that she does not want to be "a vegetable" nor does she want painful interventions.    PHYSICAL EXAM     Vital Signs (most recent): BP 117/56   Pulse 87   Temp 97.8 F (36.6 C) (Oral)   Resp 13   SpO2 96%     Constitutional: No apparent distress. Alert and cooperative Patient speaks freely in full sentences.   HEENT: Normal color, atraumatic, PERRL, no scleral icterus or conjunctival pallor, no nasal discharge, MMM, oropharynx without erythema or exudate  Neck: trachea midline, supple, no cervical or supraclavicular lymphadenopathy or masses  Cardiovascular: RRR, normal S1 S2, no murmurs, gallops, palpable thrills, no JVD, Non-displaced PMI.  Respiratory: Normal rate. No retractions or increased work of breathing. Clear to auscultation and percussion bilaterally.  Gastrointestinal: +BS, non-distended, soft, non-tender, no rebound or guarding, no hepatosplenomegaly  Genitourinary: no suprapubic or costovertebral angle tenderness  Musculoskeletal: ROM and motor strength grossly normal. No clubbing, edema, or cyanosis. DP and radial pulses 2+ and symmetric.  Skin: no rashes, jaundice or other lesions  Neurologic: EOMI, CN 2-12 grossly intact. no gross motor or sensory deficits  Psychiatric: AAOx3,  affect and mood appropriate. The patient is alert, interactive, appropriate.    LABS & IMAGING     Recent Results (from the past 24 hour(s))   CBC and differential    Collection Time: 11/24/16  9:00 AM   Result Value Ref Range    WBC 7.4 4.0 - 11.0 K/cmm    RBC 3.42 (L) 3.80 - 5.00 M/cmm  Hemoglobin 11.3 (L) 12.0 - 16.0 gm/dL    Hematocrit 54.0 (L) 36.0 - 48.0 %    MCV 101 (H) 80 - 100 fL    MCH 33 28 - 35 pg    MCHC 33 32 - 36 gm/dL    RDW 98.1 (H) 19.1 - 14.0 %    PLT CT 154 130 - 440 K/cmm    MPV 7.6 6.0 - 10.0 fL    NEUTROPHIL % 67.0 42.0 - 78.0 %    Lymphocytes 23.0 15.0 - 46.0 %    Monocytes 8.6 3.0 - 15.0 %    Eosinophils % 0.9 0.0 - 7.0 %    Basophils % 0.5 0.0 - 3.0 %    Neutrophils Absolute 5.0 1.7 - 8.6 K/cmm    Lymphocytes Absolute 1.7 0.6 - 5.1 K/cmm    Monocytes Absolute 0.6 0.1 - 1.7 K/cmm    Eosinophils Absolute 0.1 0.0 - 0.8 K/cmm    BASO Absolute 0.0 0.0 - 0.3 K/cmm    RBC Morphology       RBC Morphology Reviewed Morphology Consistent with Hemogram   Basic Metabolic Panel    Collection Time: 11/24/16  9:00 AM   Result Value Ref Range    Sodium 144 136 - 147 mMol/L    Potassium 3.8 3.5 - 5.3 mMol/L    Chloride 106 98 - 110 mMol/L    CO2 29.0 20.0 - 30.0 mMol/L    Calcium 9.6 8.5 - 10.5 mg/dL    Glucose 478 (H) 70 - 99 mg/dL    Creatinine 2.95 6.21 - 1.20 mg/dL    BUN 23 (H) 7 - 22 mg/dL    Anion Gap 30.8 7.0 - 18.0 mMol/L    BUN/Creatinine Ratio 23.5 10.0 - 30.0 Ratio    EGFR 49 (L) 60 - 150 mL/min/1.48m2    Osmolality Calc 291 275 - 300 mOsm/kg   I-Stat Troponin Notification    Collection Time: 11/24/16  9:00 AM   Result Value Ref Range    I-STAT Notification Istat Notification    ECG 12 lead    Collection Time: 11/24/16  9:00 AM   Result Value Ref Range    Patient Age 48 years    Patient DOB 1919-02-16     Patient Height      Patient Weight      Interpretation Text       Sinus rhythm  Compared to ECG 10/23/2014 14:54:56  ST (T wave) deviation no longer present      Physician Interpreter       Ventricular Rate 93 //min    QRS Duration 81 ms    P-R Interval 161 ms    Q-T Interval 343 ms    Q-T Interval(Corrected) 427 ms    P Wave Axis 52 deg    QRS Axis 17 deg    T Axis 38 years   i-Stat Troponin    Collection Time: 11/24/16  9:36 AM   Result Value Ref Range    Trop I, ISTAT <0.02 0.00 - 0.02 ng/mL     IMAGING:  XR Chest 2 Views   Final Result   I cannot exclude right hilar mass or adenopathy, but this could be related to patient rotation. A chest CT with IV contrast may be indicated. There is chronic COPD and lung fibrosis.      ReadingStation:[id number]          CARDIAC:  EKG Results     Procedure Component Value Units Date/Time  ECG 12 lead [[id number]] Collected:  11/24/16 0900     Updated:  11/24/16 0901     Patient Age 21 years      Patient DOB 03/25/1919     Patient Height --     Patient Weight --     Interpretation Text --     Sinus rhythm  Compared to ECG 10/23/2014 14:54:56  ST (T wave) deviation no longer present       Physician Interpreter --     Ventricular Rate 93 //min      QRS Duration 81 ms      P-R Interval 161 ms      Q-T Interval 343 ms      Q-T Interval(Corrected) 427 ms      P Wave Axis 52 deg      QRS Axis 17 deg      T Axis 38 years         EMERGENCY DEPARTMENT COURSE:  Orders Placed This Encounter   Procedures   . XR Chest 2 Views   . CBC and differential   . Basic Metabolic Panel   . I-Stat Troponin Notification   . Troponin I   . Diet clear liquid   . Cardiac Monitoring (Hard Wire)   . Vital signs   . Pain Assessment   . Mobility Protocol   . Notify physician   . Skin assessment   . Place sequential compression device   . Maintain sequential compression device   . RD initiate protocol if applicable   . Education: Activity   . Education: Disease Process & Condition   . Education: Pain Management   . Education: Falls Risk   . NO CPR - Support OK   . i-Stat Troponin   . ECG 12 lead   . Saline lock IV   . Place in Observation Services   . ED Admission Request       ASSESSMENT & PLAN      Kelli Brown is a 83 y.o. female admitted under OBSERVATION with chest discomfort    1. Chest discomfort  Esophageal versus cardiac etiology   Serial enzymes, if problem swallowing, GI consult for EGD tomorrow.    2. Hypertension  Medicated   Continue metoprolol XL    3. Back pain  Medicated   Continue Mobic    4. Low vitamin D level  Medicated   Continue vitamin D3    5. GI Prophylaxis  IV PPI    6. Nutrition  Clear liquid    7. DVT/VTE Prophylaxis  Ambulating/Low Risk for VTE    Signed,  Emeterio Reeve   11/24/2016 12:29 PM

## 2016-11-24 NOTE — ED Provider Notes (Signed)
[facility]  EMERGENCY DEPARTMENT  History and Physical Exam     Patient Name: Kelli Brown, Kelli Brown  Encounter Date:  11/24/2016  Attending Physician: Caralyn Guile. Rachel Moulds, M.D.  Room:  C12/C12-A  Patient DOB:  [birth year]  Age: 83 y.o. female  MRN:  [id number]  PCP: Roland Earl, MD      Diagnosis/Disposition:  MDM:     Final Impression  1. Acute chest pain        Disposition  ED Disposition     ED Disposition Condition Date/Time Comment    Admit  Tue Nov 24, 2016 11:55 AM           Follow up  No follow-up provider specified.    Prescriptions  New Prescriptions    No medications on file         The patient be admitted to the observation unit for further evaluation and treatment of the chest pain.          The results of diagnostic studies have been reviewed by myself. Available past medical, family, social, and surgical histories have been reviewed by myself. The clinical impression and plan have been discussed with the patient and/or the patient's family. All questions have been answered.      History of Presenting Illness:     Chief complaint: No chief complaint on file.      Kelli Brown is a 83 y.o. female presenting with Chest pain. The patient states started developing chest yesterday evening. She states the pain is been relatively constant without any alleviating or aggravating factors. However, the patient did states the pain started after eating. She denies that the pain is radiating. She states that she has had nausea and diaphoresis associated with the pain. She denied being short of breath. She does states that she has been having difficulty swallowing.        Review of Systems:  Physical Exam:     Review of Systems   Constitutional: Negative for chills, fatigue and fever.   HENT: Negative for congestion, ear pain and sore throat.    Respiratory: Negative for cough, chest tightness and shortness of breath.    Cardiovascular: Positive for chest pain. Negative for palpitations.    Gastrointestinal: Negative for abdominal pain, diarrhea, nausea and vomiting.   Genitourinary: Negative for dysuria, frequency and hematuria.   Musculoskeletal: Negative for arthralgias and myalgias.   Skin: Negative for rash.   Neurological: Negative for dizziness, numbness and headaches.   Hematological: Negative for adenopathy.   Psychiatric/Behavioral: Negative for agitation and dysphoric mood. The patient is not nervous/anxious.      Blood pressure 117/56, pulse 87, temperature 97.8 F (36.6 C), temperature source Oral, resp. rate 13, SpO2 96 %.    Physical Exam   Constitutional: She is oriented to person, place, and time. She appears well-developed and well-nourished.   HENT:   Head: Normocephalic and atraumatic.   Mouth/Throat: Oropharynx is clear and moist. No oropharyngeal exudate.   Eyes: EOM are normal. Pupils are equal, round, and reactive to light.   Neck: Trachea normal and normal range of motion. Neck supple. No JVD present.   Cardiovascular: Normal rate, regular rhythm, normal heart sounds and normal pulses.    Pulmonary/Chest: Effort normal and breath sounds normal.   Abdominal: Soft. Bowel sounds are normal. There is no splenomegaly or hepatomegaly. There is no tenderness. There is no rigidity, no rebound and no guarding.   Musculoskeletal: Normal range of motion.   Neurological:  She is alert and oriented to person, place, and time. She has normal strength. No cranial nerve deficit or sensory deficit.   Skin: Skin is warm, dry and intact.   Psychiatric: She has a normal mood and affect. Her speech is normal. She is not agitated.   Nursing note and vitals reviewed.         Allergies & Medications:     Pt is allergic to codeine; darvon [propoxyphene]; and phenobarbital.    Current/Home Medications    ACETAMINOPHEN (TYLENOL) 325 MG TABLET    Take 650 mg by mouth.    ASPIRIN EC 81 MG EC TABLET    Take 81 mg by mouth daily.    CALCIUM CARB-CHOLECALCIFEROL 600-500 MG-UNIT CAP    Take 1 capsule by  mouth daily.    CHOLECALCIFEROL (VITAMIN D) 1000 UNIT TABLET    Take 1,000 Units by mouth daily.    DORZOLAMIDE (TRUSOPT) 2 % OPHTHALMIC SOLUTION    Place 1 drop into the left eye 3 (three) times daily.       FUROSEMIDE (LASIX) 40 MG TABLET    Take 40 mg by mouth Every other day    KLOR-CON M 20 MEQ TABLET    Take 20 mEq by mouth daily.       LIDOCAINE (ASPERCREME W/LIDOCAINE) 4 % CREAM    Apply topically 4 (four) times daily as needed.    METOPROLOL XL (TOPROL-XL) 50 MG 24 HR TABLET    Take 50 mg by mouth daily.       PRAVASTATIN (PRAVACHOL) 40 MG TABLET    Take 40 mg by mouth daily.            Past History:     Medical: Pt has a past medical history of Abnormal vision; Arthritis; Hyperlipidemia; Hypertension; Low back pain; Macular degeneration; and Shingles.    Surgical: Pt  has a past surgical history that includes Appendectomy; Tonsillectomy; Hip surgery (Bilateral); Eye surgery; and Tubal ligation.    Family: The family history is not on file.    Social: Pt reports that she has never smoked. She has never used smokeless tobacco. She reports that she does not drink alcohol or use drugs.        Diagnostic Results:     Radiologic Studies  Xr Chest 2 Views    Result Date: 11/24/2016  I cannot exclude right hilar mass or adenopathy, but this could be related to patient rotation. A chest CT with IV contrast may be indicated. There is chronic COPD and lung fibrosis. ReadingStation:[id number]      Lab Studies  Labs Reviewed   CBC AND DIFFERENTIAL - Abnormal; Notable for the following:        Result Value    RBC 3.42 (*)     Hemoglobin 11.3 (*)     Hematocrit 34.5 (*)     MCV 101 (*)     RDW 18.3 (*)     All other components within normal limits   BASIC METABOLIC PANEL - Abnormal; Notable for the following:     Glucose 108 (*)     BUN 23 (*)     EGFR 49 (*)     All other components within normal limits   [facility] I-STAT TROPONIN NOTIFICATION   I-STAT TROPONIN         Procedure/EKG:             ATTESTATIONS     Paige Monarrez D. Rachel Moulds,  M.D.  Tessie Fass, MD  11/24/16 1155

## 2016-11-24 NOTE — Plan of Care (Signed)
Problem: Moderate/High Fall Risk Score >5  Goal: Patient will remain free of falls  Outcome: Progressing  Pt remains free from falls this admission.    Problem: Chest Pain  Goal: Vital signs and cardiac rhythm stable  Outcome: Progressing  Pt denies CP/SOB, VSS.

## 2016-11-24 NOTE — ED Notes (Signed)
Patient's granddaughter called. Floy [phone].

## 2016-11-24 NOTE — Plan of Care (Signed)
Problem: Chest Pain  Goal: Vital signs and cardiac rhythm stable  Outcome: Progressing   11/24/16 1814   Goal/Interventions addressed this shift   Vital signs and cardiac rhythm stable Monitor Kelli Brown vital signs/cardiac rhythms;Monitor labs;Assess the need for oxygen therapy and administer as ordered

## 2016-11-24 NOTE — ED Notes (Signed)
Bed: C12-A  Expected date:   Expected time:   Means of arrival:   Comments:  EMS

## 2016-11-25 ENCOUNTER — Ambulatory Visit (INDEPENDENT_AMBULATORY_CARE_PROVIDER_SITE_OTHER): Payer: Medicare Other | Admitting: Physical Medicine & Rehabilitation

## 2016-11-25 LAB — CBC AND DIFFERENTIAL
Basophils %: 0.3 % (ref 0.0–3.0)
Basophils Absolute: 0 10*3/uL (ref 0.0–0.3)
Eosinophils %: 1.1 % (ref 0.0–7.0)
Eosinophils Absolute: 0.1 10*3/uL (ref 0.0–0.8)
Hematocrit: 32 % — ABNORMAL LOW (ref 36.0–48.0)
Hemoglobin: 10.4 gm/dL — ABNORMAL LOW (ref 12.0–16.0)
Lymphocytes Absolute: 1.7 10*3/uL (ref 0.6–5.1)
Lymphocytes: 25.8 % (ref 15.0–46.0)
MCH: 33 pg (ref 28–35)
MCHC: 33 gm/dL (ref 32–36)
MCV: 101 fL — ABNORMAL HIGH (ref 80–100)
MPV: 7.4 fL (ref 6.0–10.0)
Monocytes Absolute: 0.6 10*3/uL (ref 0.1–1.7)
Monocytes: 9.4 % (ref 3.0–15.0)
Neutrophils %: 63.5 % (ref 42.0–78.0)
Neutrophils Absolute: 4.1 10*3/uL (ref 1.7–8.6)
PLT CT: 133 10*3/uL (ref 130–440)
RBC: 3.16 10*6/uL — ABNORMAL LOW (ref 3.80–5.00)
RDW: 18.4 % — ABNORMAL HIGH (ref 11.0–14.0)
WBC: 6.4 10*3/uL (ref 4.0–11.0)

## 2016-11-25 NOTE — Progress Notes (Signed)
Pt assisted to [facility] by this RN. Pt transferred to [facility] without difficulties & returned to bed without difficulties.

## 2016-11-25 NOTE — Plan of Care (Signed)
Problem: Chest Pain  Goal: Vital signs and cardiac rhythm stable  Outcome: Progressing

## 2016-11-25 NOTE — Discharge Instructions (Signed)
Noncardiac Chest Pain    Based on your visit today, the health care provider doesn't know what is causing your chest pain. In most cases, people who come to the emergency department with chest pain don't have a problem with their heart. Instead, the pain is caused by other conditions. These may be problems with the lungs, muscles, bones, digestive tract, nerves, or mental health.  Lung problems   Inflammation around the lungs (pleurisy)   Collapsed lung (pneumothorax)   Fluid around the lungs (pleural effusion)   Lung cancer. This is a rare cause of chest pain.  Muscle or bone problems   Inflamed cartilage between the ribs (pleurisy)   Fibromyalgia   Rheumatoid arthritis  Digestive system problems   Reflux   Stomach ulcer   Spasms of the esophagus   Gall stones   Gallbladder inflammation  Mental health conditions   Panic or anxiety attacks   Emotional distress  Your condition doesn't seem serious and your pain doesn't appear to be coming from your heart. But sometimes the signs of a serious problem take more time to appear. Watch for the warning signs listed below.  Home care  Follow these guidelines when caring for yourself at home:   Rest today and avoid strenuous activity.   Take any prescribed medicine as directed.  Follow-up care  Follow up with your health care provider, or as advised, if you don't start to feel better within 24 hours.  When to seek medical advice  Call your health care provider right away if any of these occur:   A change in the type of pain. Call if it feels different, becomes more serious, lasts longer, or begins to spread into your shoulder, arm, neck, jaw, or back.   Shortness of breath   You feel more pain when you breathe   Cough with dark-colored mucus or blood   Weakness, dizziness, or fainting   Fever of 100.4F (38C) or higher, or as directed by your health care provider   Swelling, pain, or redness in one leg  Date Last Reviewed: 10/16/2013   2000-2016  [employer] [address], [place] [address]. All rights reserved. This information is not intended as a substitute for professional medical care. Always follow your healthcare professional's instructions.

## 2016-11-25 NOTE — Progress Notes (Signed)
Pt helped to [facility] by EDT Maisie Fus. Pt voided. Pt returned to bed without difficulties. No complaints at this time, 2 side rails up, call bell within reach.

## 2016-11-25 NOTE — Progress Notes (Signed)
Pt to be discharged. Refused morning meds. Requested to take her when she returns home. Helped pt dress. Wanted to eat lunch before leaving. Pt friend taking her back to [employer]. Called and gave report staff on pt.

## 2016-11-25 NOTE — UM Notes (Signed)
[facility] Utilization Management Review Sheet    Facility :  [facility]    NAME: Kelli Brown  MR#: [id number]    CSN#: [id number]    ROOM: 2503/2503-A AGE: 83 y.o.    ADMIT DATE AND TIME: 11/24/2016  8:54 AM      PATIENT CLASS:   11/24/16 1203  Place in Observation Services             ATTENDING PHYSICIAN: Emeterio Reeve, MD  PAYOR:Payor: MEDICARE / Plan: MEDICARE PART A AND B / Product Type: *No Product type* /       AUTH #:     DIAGNOSIS:     ICD-10-CM    1. Acute chest pain R07.9        HISTORY:   Past Medical History:   Diagnosis Date   . Abnormal vision    . Arthritis    . Hyperlipidemia    . Hypertension    . Low back pain    . Macular degeneration    . Shingles      1/2 admit to obs   Per DR Threasa Beards   chest discomfort after eating revealed cake last night. She has been coughing quite a bit since then. She was given aspirin here in the ED and she found that it was very uncomfortable with the burning sensation. She did have some cereal for breakfast this morning and was able to keep it down. She continues to cough up some "foam".     , Chest/Pulmonary: Coughing up mucus since she ate the red velvet cake,    Kelli Brown is a 83 y.o. female admitted under OBSERVATION with chest discomfort    1. Chest discomfort  Esophageal versus cardiac etiology   Serial enzymes, if problem swallowing, GI consult for EGD tomorrow.    2. Hypertension  Medicated   Continue metoprolol XL    3. Back pain  Medicated   Continue Mobic    4. Low vitamin D level  Medicated   Continue vitamin D3    5. GI Prophylaxis  IV PPI    6. Nutrition  Clear liquid    7. DVT/VTE Prophylaxis  Ambulating/Low Risk for VTE    147/66 98.2 92 19  Trop neg x 2     IVF at 100 hr   Nitrobid  1 inch   Multiple oral meds   Regular diet     CXR   I cannot exclude right hilar mass or adenopathy, but this could be related to patient rotation. A chest CT with IV contrast may be indicated. There is chronic COPD and lung  fibrosis.    DATE OF REVIEW: 11/25/2016    VITALS: BP 105/68   Pulse 90   Temp 98.2 F (36.8 C) (Oral)   Resp 20   Ht 1.575 m (5\' 2" )   Wt 74.4 kg (164 lb 0.4 oz)   SpO2 98%   BMI 30.00 kg/m     Active Hospital Problems    Diagnosis   . Chest pain of uncertain etiology       IVF    Regular diet          Leane Para RN  [facility]   Utilization Review  Phone  # [phone]  Fax [phone]

## 2016-11-25 NOTE — Discharge Summary (Signed)
[facility]      Patient: Kelli Brown  Admission Date: 11/24/2016   DOB: [birth year]  Discharge Date: 11/25/2016    MRN: [id number]  Discharge Attending: Emeterio Reeve, MD   Referring Physician: Flossie Dibble, MD  PCP: Flossie Dibble, MD       DISCHARGE SUMMARY     Discharge Information   Admission Diagnosis:   1. Chest pain, noncardiac  2. History hypertension  3. History hyperlipidemia    Discharge Diagnosis:   Patient Active Problem List    Diagnosis Date Noted   . Chest pain of uncertain etiology 11/24/2016   . Chronic midline low back pain without sciatica 09/23/2016   . Ocular hypertension of left eye 09/04/2015   . Cervical spine fracture 10/23/2014        Discharge Medications:     Medication List      CONTINUE taking these medications    ASPERCREME W/LIDOCAINE 4 % cream  Generic drug:  lidocaine     aspirin EC 81 MG EC tablet     calcium carbonate 1500 (600 Ca) MG Tabs tablet     dorzolamide 2 % ophthalmic solution  Commonly known as:  TRUSOPT     furosemide 40 MG tablet  Commonly known as:  LASIX     KLOR-CON M 20 MEQ tablet  Generic drug:  potassium chloride     meloxicam 7.5 MG tablet  Commonly known as:  MOBIC     metoprolol succinate XL 50 MG 24 hr tablet  Commonly known as:  TOPROL-XL     multivitamin with minerals tablet     pravastatin 40 MG tablet  Commonly known as:  PRAVACHOL     vitamin D 1000 UNIT tablet  Commonly known as:  cholecalciferol                Hospital Course   Presentation History   Kelli Brown is a 83 y.o. female with a PMHx of Hyperlipidemia, hypertension but no cardiac history who presented with chest discomfort after eating revealed cake last night. She has been coughing quite a bit since then. She was given aspirin here in the ED and she found that it was very uncomfortable with the burning sensation. She did have some cereal for breakfast this morning and was able to keep it down. She continues to cough up some "foam". Denies any problem with  swallowing in the past. Denies any shortness of breath, diaphoresis or nausea.    Hospital Course (0 Days)   Patient was transferred to the observation unit for ongoing telemetry monitoring and serial cardiac enzymes only. She did well overnight. She had no further chest pain. She is tolerating a regular diet and prefers to be discharged home. She should contact her primary care physician to schedule follow-up appointment in 1 week. She should also return to the emergency department with any new or worsening symptoms.    Procedures/Imaging:   XR Chest 2 Views   Final Result   I cannot exclude right hilar mass or adenopathy, but this could be related to patient rotation. A chest CT with IV contrast may be indicated. There is chronic COPD and lung fibrosis.      ReadingStation:[id number]          Treatment Team:   Attending Provider: Emeterio Reeve, MD       Progress Note/Physical Exam at Discharge   Vitals:    11/24/16 1700 11/24/16 2048 11/25/16 5409  11/25/16 0700   BP:  123/54 118/59 105/68   Pulse: 90 85 84 90   Resp: 13 20 15 20    Temp:  98.1 F (36.7 C) 98.2 F (36.8 C) 98.2 F (36.8 C)   TempSrc:  Oral Oral Oral   SpO2: 97% 96% 95% 98%   Weight: 74.4 kg (164 lb 0.4 oz)      Height: 1.575 m (5\' 2" )           Diagnostics     Labs/Studies Pending at Discharge: No    Last Labs     Recent Labs  Lab 11/25/16  0535 11/24/16  0900   WBC 6.4 7.4   RBC 3.16* 3.42*   Hemoglobin 10.4* 11.3*   Hematocrit 32.0* 34.5*   MCV 101* 101*   PLT CT 133 154         Recent Labs  Lab 11/24/16  0900   Sodium 144   Potassium 3.8   Chloride 106   CO2 29.0   BUN 23*   Creatinine 0.98   Glucose 108*   Calcium 9.6        Patient Instructions   Discharge Diet: regular diet  Discharge Activity:  activity as tolerated    Follow Up Appointment:  Follow-up Information     Flossie Dibble, MD Follow up.    Specialty:  Family Medicine  Why:  Call the office to reschedule your appointment in 1 week  Contact information:  [address]  [place] [address]  [phone]                    Time spent examining patient, discussing with patient/family regarding hospital course, chart review, reconciling medications and discharge planning: 30 minutes.      Rolin Barry, NP    9:54 AM 11/25/2016

## 2016-11-30 ENCOUNTER — Ambulatory Visit (INDEPENDENT_AMBULATORY_CARE_PROVIDER_SITE_OTHER): Payer: Medicare Other | Admitting: Physical Medicine & Rehabilitation

## 2016-11-30 ENCOUNTER — Encounter (INDEPENDENT_AMBULATORY_CARE_PROVIDER_SITE_OTHER): Payer: Self-pay | Admitting: Physical Medicine & Rehabilitation

## 2016-11-30 VITALS — BP 132/54 | HR 92 | Wt 159.0 lb

## 2016-11-30 DIAGNOSIS — M5137 Other intervertebral disc degeneration, lumbosacral region: Secondary | ICD-10-CM

## 2016-11-30 DIAGNOSIS — M79605 Pain in left leg: Secondary | ICD-10-CM

## 2016-11-30 DIAGNOSIS — M79604 Pain in right leg: Secondary | ICD-10-CM

## 2016-11-30 DIAGNOSIS — M545 Low back pain: Secondary | ICD-10-CM

## 2016-11-30 DIAGNOSIS — M5136 Other intervertebral disc degeneration, lumbar region: Secondary | ICD-10-CM

## 2016-11-30 DIAGNOSIS — M1612 Unilateral primary osteoarthritis, left hip: Secondary | ICD-10-CM

## 2016-11-30 DIAGNOSIS — Z8739 Personal history of other diseases of the musculoskeletal system and connective tissue: Secondary | ICD-10-CM

## 2016-11-30 NOTE — Progress Notes (Signed)
Follow-up visit      Patient Name:                                                 Kelli Brown, Kelli Brown  DOB  06-29-1919  MRN  [id number]    Referring Physician: Dr. Tylene Fantasia      History of Present Illness:    As you know, Kelli Brown  is a pleasant 83 y.o., female with the chief complaint of axial low back pain located in the midline and bilateral, right greater than left, paramidline lumbosacral region and less severe, bilateral, left greater than right, lateral thigh.  The pain is a 7 out of 10 in severity, and aching, dull in quality.  Onset gradual, 15 years duration, worsening over the past 47-months.  Exacerbating factors include standing and walking.  Alleviating factors include sitting . Associated symptoms are denied by the patient.     Today the patient reports 75% improvement in her index lower back pain s/p bilateral S1 TFESI (#2 - 11/04/16). She reports benefit was sustained for 1-2 weeks with gradual return to 25-50% overall improvement. She reports an associated functional benefit, though endorses persistent pain limited function due to her index pain. She reports a secondary complaint of left groin pain, 1-2 weeks duration. Spontaneous and gradual onset.       Prior Treatments: Physical therapy, epidural steroid injections    Prior Medications: Gabapentin, PRN Tylenol, PRN Hydrocodone    Current Medications:    Current Outpatient Prescriptions:   .  aspirin EC 81 MG EC tablet, Take 81 mg by mouth daily., Disp: , Rfl:   .  calcium carbonate 1500 (600 Ca) MG Tab tablet, Take 1,500 mg by mouth daily.  , Disp: , Rfl:   .  Cholecalciferol (VITAMIN D) 1000 UNIT tablet, Take 1,000 Units by mouth daily., Disp: , Rfl:   .  dorzolamide (TRUSOPT) 2 % ophthalmic solution, Place 1 drop into the left eye 3 (three) times daily.  , Disp: , Rfl:   .  furosemide (LASIX) 40 MG tablet, Take 40 mg by mouth daily., Disp: , Rfl:   .  KLOR-CON M 20 MEQ tablet, Take 20 mEq by mouth daily.  , Disp: , Rfl:   .   lidocaine (ASPERCREME W/LIDOCAINE) 4 % cream, Apply topically 4 (four) times daily as needed., Disp: , Rfl:   .  meloxicam (MOBIC) 7.5 MG tablet, Take 7.5 mg by mouth daily., Disp: , Rfl:   .  metoprolol XL (TOPROL-XL) 50 MG 24 hr tablet, Take 50 mg by mouth daily.  , Disp: , Rfl:   .  Multiple Vitamins-Minerals (MULTIVITAMIN WITH MINERALS) tablet, Take 1 tablet by mouth daily., Disp: , Rfl:   .  pravastatin (PRAVACHOL) 40 MG tablet, Take 40 mg by mouth daily.  , Disp: , Rfl:     Allergies:  Allergies   Allergen Reactions   . Codeine Nausea And Vomiting and Other (See Comments)     LIGHTHEADEDNESS.   . Darvon [Propoxyphene] Nausea And Vomiting and Other (See Comments)     LIGHTHEADEDNESS.   Marland Kitchen Phenobarbital Swelling and Rash       Past Medical History:  Past Medical History:   Diagnosis Date   . Abnormal vision    . Arthritis    . Hyperlipidemia    . Hypertension    .  Low back pain    . Macular degeneration    . Shingles        Past Surgical History:  Past Surgical History:   Procedure Laterality Date   . APPENDECTOMY     . EYE SURGERY      cateracts   . HIP SURGERY Bilateral     Tubes tied   . TONSILLECTOMY     . TUBAL LIGATION         Family History:  No family history on file.    Social History:  Social History     Social History   . Marital status: Widowed     Spouse name: N/A   . Number of children: N/A   . Years of education: N/A     Occupational History   . Not on file.     Social History Main Topics   . Smoking status: Never Smoker   . Smokeless tobacco: Never Used   . Alcohol use No   . Drug use: No   . Sexual activity: Not Currently     Birth control/ protection: Post-menopausal     Other Topics Concern   . Not on file     Social History Narrative   . No narrative on file         Review Of Systems:    Constitutional:  Fatigue  Neurological:  Negative  Musculoskeletal:  Negative  HEENT:  Vision changes  Cardiovascular:  Shortness of breath  Respiratory: Shortness of breath  Gastrointestinal:   Negative  Genitourinary:  Frequent urination  Skin: Negative  Psychiatric: Negative  Female: Negative    Physical Examination:    Vitals:    11/30/16 1421   BP: 132/54   Pulse: 92   SpO2: 99%     Constitutional: The patient is oriented to person, place and time with normal mood and affect. Ambulation mildly antalgic with rolling walker for community and household ambulation  Skin: unremarkable  Lymph nodes: Inspection for gross deformities is negative  Girth: Upper and lower limbs are symmetric  Pulses: Peripheral pulses are intact distally in the bilateral upper and lower limbs  Palpation: There is no pain on palpation of the cervical, thoracic, lumbosacral spine  Joints:  Functional range of motion in the bilateral upper and lower limbs, neck, and trunk with no evidence of instability or effusion. Restricted left hip PROM in internal rotation. No pain with left hip compression/circumduction, log roll or PROM.   Spine: Sustained Hip Flexion is positive, SLR on the left is negative, negative Braggard's sign, Lumbar range of motion mildly restricted.   Neurologic:  Manual muscle testing 5/5 in the bilateral upper and lower limbs.  Muscle stretch reflexes 2+ in the bilateral upper and lower limbs.  Sensation intact to light touch in the bilateral upper and lower limbs.  Upper motor neuron signs negative in the bilateral upper and lower limbs.  Toes are down-going bilaterally, no clonus at the ankle bilaterally, Hoffman's sign negative bilaterally.      Lab Review:  Recent Results (from the past 240 hour(s))   CBC and differential    Collection Time: 11/24/16  9:00 AM   Result Value Ref Range    WBC 7.4 4.0 - 11.0 K/cmm    RBC 3.42 (L) 3.80 - 5.00 M/cmm    Hemoglobin 11.3 (L) 12.0 - 16.0 gm/dL    Hematocrit 52.8 (L) 36.0 - 48.0 %    MCV 101 (H) 80 - 100 fL  MCH 33 28 - 35 pg    MCHC 33 32 - 36 gm/dL    RDW 16.1 (H) 09.6 - 14.0 %    PLT CT 154 130 - 440 K/cmm    MPV 7.6 6.0 - 10.0 fL    NEUTROPHIL % 67.0 42.0 - 78.0 %     Lymphocytes 23.0 15.0 - 46.0 %    Monocytes 8.6 3.0 - 15.0 %    Eosinophils % 0.9 0.0 - 7.0 %    Basophils % 0.5 0.0 - 3.0 %    Neutrophils Absolute 5.0 1.7 - 8.6 K/cmm    Lymphocytes Absolute 1.7 0.6 - 5.1 K/cmm    Monocytes Absolute 0.6 0.1 - 1.7 K/cmm    Eosinophils Absolute 0.1 0.0 - 0.8 K/cmm    BASO Absolute 0.0 0.0 - 0.3 K/cmm    RBC Morphology       RBC Morphology Reviewed Morphology Consistent with Hemogram   Basic Metabolic Panel    Collection Time: 11/24/16  9:00 AM   Result Value Ref Range    Sodium 144 136 - 147 mMol/L    Potassium 3.8 3.5 - 5.3 mMol/L    Chloride 106 98 - 110 mMol/L    CO2 29.0 20.0 - 30.0 mMol/L    Calcium 9.6 8.5 - 10.5 mg/dL    Glucose 045 (H) 70 - 99 mg/dL    Creatinine 4.09 8.11 - 1.20 mg/dL    BUN 23 (H) 7 - 22 mg/dL    Anion Gap 91.4 7.0 - 18.0 mMol/L    BUN/Creatinine Ratio 23.5 10.0 - 30.0 Ratio    EGFR 49 (L) 60 - 150 mL/min/1.20m2    Osmolality Calc 291 275 - 300 mOsm/kg   I-Stat Troponin Notification    Collection Time: 11/24/16  9:00 AM   Result Value Ref Range    I-STAT Notification Istat Notification    ECG 12 lead    Collection Time: 11/24/16  9:00 AM   Result Value Ref Range    Patient Age 15 years    Patient DOB 1919-01-27     Patient Height      Patient Weight      Interpretation Text       Sinus rhythm  Compared to ECG 10/23/2014 14:54:56      Electronically Signed On 11-24-2016 14:48:21 EST by Lennie Muckle      Physician Interpreter Lennie Muckle     Ventricular Rate 93 //min    QRS Duration 81 ms    P-R Interval 161 ms    Q-T Interval 343 ms    Q-T Interval(Corrected) 427 ms    P Wave Axis 52 deg    QRS Axis 17 deg    T Axis 38 years   i-Stat Troponin    Collection Time: 11/24/16  9:36 AM   Result Value Ref Range    Trop I, ISTAT <0.02 0.00 - 0.02 ng/mL   Troponin I    Collection Time: 11/24/16  2:06 PM   Result Value Ref Range    Troponin I 0.01 0.00 - 0.02 ng/mL   Lipid panel    Collection Time: 11/24/16  2:06 PM   Result Value Ref Range    Cholesterol 191 75  - 199 mg/dL    Triglycerides 82 10 - 150 mg/dL    HDL 62 45 - 65 mg/dL    LDL Calculated 782 mg/dL    Coronary Heart Disease Risk 3.08     VLDL 16 0 -  40   Troponin I    Collection Time: 11/24/16  6:46 PM   Result Value Ref Range    Troponin I <0.01 0.00 - 0.02 ng/mL   CBC with Automated Differential    Collection Time: 11/25/16  5:35 AM   Result Value Ref Range    WBC 6.4 4.0 - 11.0 K/cmm    RBC 3.16 (L) 3.80 - 5.00 M/cmm    Hemoglobin 10.4 (L) 12.0 - 16.0 gm/dL    Hematocrit 95.1 (L) 36.0 - 48.0 %    MCV 101 (H) 80 - 100 fL    MCH 33 28 - 35 pg    MCHC 33 32 - 36 gm/dL    RDW 88.4 (H) 16.6 - 14.0 %    PLT CT 133 130 - 440 K/cmm    MPV 7.4 6.0 - 10.0 fL    NEUTROPHIL % 63.5 42.0 - 78.0 %    Lymphocytes 25.8 15.0 - 46.0 %    Monocytes 9.4 3.0 - 15.0 %    Eosinophils % 1.1 0.0 - 7.0 %    Basophils % 0.3 0.0 - 3.0 %    Neutrophils Absolute 4.1 1.7 - 8.6 K/cmm    Lymphocytes Absolute 1.7 0.6 - 5.1 K/cmm    Monocytes Absolute 0.6 0.1 - 1.7 K/cmm    Eosinophils Absolute 0.1 0.0 - 0.8 K/cmm    BASO Absolute 0.0 0.0 - 0.3 K/cmm         Radiology Review:  Magnetic resonance imaging of the lumbosacral spine dated:  07/13/16  L5S1 mod  desiccation, 25-50% loss of disc ht, grade 1 retrolisthesis, small broad based with left eccentric disc protrusion, mild left lateral recess stenosis, mild left and severe right L5 foraminal stenosis,   L45 mod desiccation, 25-50 % loss of disc ht, type I modic endplate changes, grade 1 anterolisthesis, broad based protrusion, bilateral FJA with LF buckling, mod CCS, mild/mod bilateral L4 foraminal stenosis   L34 mod desiccation, 25-50 % loss of disc ht, broad based protrusion, bilateral FJA with LF buckling, mild/mod CCS, mod bilateral L3 foraminal stenosis   L23 mod desiccation, < 25 % loss of disc ht    Flexion/extension lumbosacral xrays dated: 10/07/16  Loss of lumbar lordosis  Poor imaging quality limits interpretation  L5S1 50-75% loss of disc ht, grade 1 anterolisthesis   L45  50-75%  loss of disc ht  L34 50-75% loss of disc ht  L23 25-50% loss of disc ht  No instability.         ODI: 26    Impression:  1. Axial lower back pain c/w IDD at L5S1 vs L45 vs L34 vs L5S1 vs L45 vs L34 painful facet joint arthrosis.   2. Bilateral, L>R, lower limb pain c/w somatic referral from #1 vs L5 vs S1 vs less likely, L4 radicular pain due to LSS.  3. Left groin pain c/w IA hip joint pathology vs less likely, L5 vs L4 radicular pain due to LSS.   4. h/o osteoporosis - on Vit D supplementation.      Plan:  1. Will obtain left hip xray series.   2. Will move forward with bilateral S1 vs multilevel transforaminal epidural steroid injection (#3).   2. Continue home exercise program. Continue PRN APAP for PO analgesia.        Landry Mellow, DO    [facility]      Thank you for allowing me to be involved in the care of  this patient.  If you have any questions please contact me at [facility] at [phone]    CC: Referring Provider, Flossie Dibble, MD

## 2016-12-02 ENCOUNTER — Ambulatory Visit
Admission: RE | Admit: 2016-12-02 | Discharge: 2016-12-02 | Disposition: A | Discharge status: Home / Self Care | Payer: Medicare Other | Source: Ambulatory Visit | Attending: Physical Medicine & Rehabilitation | Admitting: Physical Medicine & Rehabilitation

## 2016-12-02 DIAGNOSIS — M1612 Unilateral primary osteoarthritis, left hip: Secondary | ICD-10-CM

## 2016-12-09 ENCOUNTER — Ambulatory Visit (INDEPENDENT_AMBULATORY_CARE_PROVIDER_SITE_OTHER): Payer: Medicare Other | Admitting: Physical Medicine & Rehabilitation

## 2016-12-09 ENCOUNTER — Encounter (INDEPENDENT_AMBULATORY_CARE_PROVIDER_SITE_OTHER): Payer: Self-pay | Admitting: Physical Medicine & Rehabilitation

## 2016-12-09 DIAGNOSIS — M545 Low back pain: Secondary | ICD-10-CM

## 2016-12-09 DIAGNOSIS — M5136 Other intervertebral disc degeneration, lumbar region: Secondary | ICD-10-CM

## 2016-12-09 DIAGNOSIS — M5137 Other intervertebral disc degeneration, lumbosacral region: Secondary | ICD-10-CM

## 2016-12-09 MED ORDER — TRIAMCINOLONE ACETONIDE 40 MG/ML IJ SUSP
80.0000 mg | Freq: Once | INTRAMUSCULAR | Status: AC
Start: 2016-12-09 — End: 2016-12-09
  Administered 2016-12-09: 80 mg

## 2016-12-09 MED ORDER — LIDOCAINE HCL (PF) 2 % IJ SOLN
5.0000 mL | Freq: Once | INTRAMUSCULAR | Status: AC
Start: 2016-12-09 — End: 2016-12-09
  Administered 2016-12-09: 5 mL

## 2016-12-09 MED ORDER — IOHEXOL 240 MG/ML IJ SOLN
50.0000 mL | Freq: Once | INTRAMUSCULAR | Status: AC
Start: 2016-12-09 — End: 2016-12-09
  Administered 2016-12-09: 50 mL

## 2016-12-09 NOTE — Procedures (Signed)
Transforaminal epidural steroid injection  Procedure performed:  Therapeutic (right) (S1) TFESI  Reason for procedure: axial low back pain, M51.36, M51.37  Surgeon: Dr. Asherah Lavoy  PROCEDURE: After obtaining informed consent, the patient was placed in the prone position on the fluoroscopy table. A time out was held for patient verification. Cycled one minute blood pressure, pulse,and pulse oximetry was maintained. The lumbar skin was prepped and draped in a sterile fashion with Chloraprep and sterile towels. Conscious sedation was not administered. After raising a skin wheal with 1 percent Xylocaine and anesthetizing the subcutaneous tissues, a #22 gauge 3.5 inch spinal needle was placed under fluoroscopic control to the (right) (S1) foramen angled caudally.  Approximately 1-2 milliliters of Omnipaque 240 contrast dye was injected, demonstrating perineural and anterior epidural spread without vascular uptake in the anteroposterior and oblique views. There was opacification of the nerve root sheath with epidural reflux of contrast material extending into the anterior epidural space.  A total of 1 milliliter of 1 percent Xylocaine and 1.5mls of 40mg/ml kenalog was instilled around the (S1) nerve root. The patient tolerated the procedure well and was transitioned to the post procedure holding area uneventfully. The patient was monitored for adverse allergic, paralytic and hypertensive reactions. The patient was discharged without complication.    Transforaminal epidural steroid injection  Procedure performed:  Therapeutic (left) (S1) TFESI  Reason for procedure: axial low back pain  Surgeon: Dr. Jurrell Royster  PROCEDURE: After obtaining informed consent, the patient was placed in the prone position on the fluoroscopy table. A time out was held for patient verification. Cycled one minute blood pressure, pulse, and pulse oximetry was maintained. The lumbar skin was prepped and draped in a sterile fashion with Chloraprep  and sterile towels. Conscious sedation was not administered. After raising a skin wheal with 1 percent Xylocaine and anesthetizing the subcutaneous tissues, a #22 gauge 3.5 inch spinal needle was placed under fluoroscopic control to the (left) (S1) foramen angled caudally.  1-2 milliliters of Omnipaque 240 contrast dye was injected, demonstrating perinerual and anterior epidural spread without vascular uptake in the anteroposterior and oblique views. There was opacification of the nerve root sheath with epidural reflux of contrast material extending into the anterior epidural space.  A total of 1 milliliter of 1 percent Xylocaine and 1.5mls of 40mg/ml kenalog was instilled around the (S1) nerve root. The patient tolerated the procedure well and was transitioned to the post procedure holding area uneventfully. The patient was monitored for adverse allergic, paralytic and hypertensive reactions. The patient was discharged without complication.

## 2016-12-23 ENCOUNTER — Encounter (INDEPENDENT_AMBULATORY_CARE_PROVIDER_SITE_OTHER): Payer: Self-pay | Admitting: Physical Medicine & Rehabilitation

## 2016-12-23 ENCOUNTER — Ambulatory Visit (INDEPENDENT_AMBULATORY_CARE_PROVIDER_SITE_OTHER): Payer: Medicare Other | Admitting: Physical Medicine & Rehabilitation

## 2016-12-23 VITALS — BP 121/75 | HR 100 | Wt 153.0 lb

## 2016-12-23 DIAGNOSIS — M79605 Pain in left leg: Secondary | ICD-10-CM

## 2016-12-23 DIAGNOSIS — Z8739 Personal history of other diseases of the musculoskeletal system and connective tissue: Secondary | ICD-10-CM

## 2016-12-23 DIAGNOSIS — M5417 Radiculopathy, lumbosacral region: Secondary | ICD-10-CM

## 2016-12-23 DIAGNOSIS — M545 Low back pain: Secondary | ICD-10-CM

## 2016-12-23 DIAGNOSIS — M5416 Radiculopathy, lumbar region: Secondary | ICD-10-CM

## 2016-12-23 DIAGNOSIS — M79604 Pain in right leg: Secondary | ICD-10-CM

## 2016-12-23 DIAGNOSIS — M25552 Pain in left hip: Secondary | ICD-10-CM

## 2016-12-23 DIAGNOSIS — M1612 Unilateral primary osteoarthritis, left hip: Secondary | ICD-10-CM

## 2016-12-23 NOTE — Progress Notes (Signed)
Follow-up visit      Patient Name:                                                 Kelli Brown, Kelli Brown  DOB  [birth year]  MRN  [id number]    Referring Physician: Dr. Tylene Fantasia      History of Present Illness:    As you know, Kelli Brown  is a pleasant 83 y.o., female with the chief complaint of axial low back pain located in the midline and bilateral, right greater than left, paramidline lumbosacral region and less severe, bilateral, left greater than right, lateral thigh.  The pain is a 7 out of 10 in severity, and aching, dull in quality.  Onset gradual, 15 years duration, worsening over the past 55-months.  Exacerbating factors include standing and walking.  Alleviating factors include sitting . Associated symptoms are denied by the patient.     Today the patient reports 80% improvement in her index lower back pain s/p bilateral S1 TFESI (#2 - 12/09/16). She reports more severe left groin and anterior/medial thigh pain, 5 weeks duration. Spontaneous and gradual onset. She reports pain is significant functionally limiting and interfering with mobility.       Prior Treatments: Physical therapy, epidural steroid injections    Prior Medications: Gabapentin, PRN Tylenol, PRN Hydrocodone    Current Medications:    Current Outpatient Prescriptions:   .  aspirin EC 81 MG EC tablet, Take 81 mg by mouth daily., Disp: , Rfl:   .  calcium carbonate 1500 (600 Ca) MG Tab tablet, Take 1,500 mg by mouth daily.  , Disp: , Rfl:   .  Cholecalciferol (VITAMIN D) 1000 UNIT tablet, Take 1,000 Units by mouth daily., Disp: , Rfl:   .  dorzolamide (TRUSOPT) 2 % ophthalmic solution, Place 1 drop into the left eye 3 (three) times daily.  , Disp: , Rfl:   .  furosemide (LASIX) 40 MG tablet, Take 40 mg by mouth daily., Disp: , Rfl:   .  KLOR-CON M 20 MEQ tablet, Take 20 mEq by mouth daily.  , Disp: , Rfl:   .  lidocaine (ASPERCREME W/LIDOCAINE) 4 % cream, Apply topically 4 (four) times daily as needed., Disp: , Rfl:   .  metoprolol XL  (TOPROL-XL) 50 MG 24 hr tablet, Take 50 mg by mouth daily.  , Disp: , Rfl:   .  Multiple Vitamins-Minerals (MULTIVITAMIN WITH MINERALS) tablet, Take 1 tablet by mouth daily., Disp: , Rfl:   .  pravastatin (PRAVACHOL) 40 MG tablet, Take 40 mg by mouth daily.  , Disp: , Rfl:     Allergies:  Allergies   Allergen Reactions   . Codeine Nausea And Vomiting and Other (See Comments)     LIGHTHEADEDNESS.   . Darvon [Propoxyphene] Nausea And Vomiting and Other (See Comments)     LIGHTHEADEDNESS.   Marland Kitchen Phenobarbital Swelling and Rash       Past Medical History:  Past Medical History:   Diagnosis Date   . Abnormal vision    . Arthritis    . Hyperlipidemia    . Hypertension    . Low back pain    . Macular degeneration    . Shingles        Past Surgical History:  Past Surgical History:   Procedure Laterality Date   .  APPENDECTOMY     . EYE SURGERY      cateracts   . HIP SURGERY Bilateral     Tubes tied   . TONSILLECTOMY     . TUBAL LIGATION         Family History:  No family history on file.    Social History:  Social History     Social History   . Marital status: Widowed     Spouse name: N/A   . Number of children: N/A   . Years of education: N/A     Occupational History   . Not on file.     Social History Main Topics   . Smoking status: Never Smoker   . Smokeless tobacco: Never Used   . Alcohol use No   . Drug use: No   . Sexual activity: Not Currently     Birth control/ protection: Post-menopausal     Other Topics Concern   . Not on file     Social History Narrative   . No narrative on file         Review Of Systems:    Constitutional:  Fatigue  Neurological:  Negative  Musculoskeletal:  Negative  HEENT:  Vision changes  Cardiovascular:  Shortness of breath  Respiratory: Shortness of breath  Gastrointestinal:  Negative  Genitourinary:  Frequent urination  Skin: Negative  Psychiatric: Negative  Female: Negative    Physical Examination:    Vitals:    12/23/16 1528   BP: 121/75   Pulse: 100   SpO2: 95%     Constitutional: The  patient is oriented to person, place and time with normal mood and affect. Ambulation mildly antalgic with rolling walker for community and household ambulation  Skin: unremarkable  Lymph nodes: Inspection for gross deformities is negative  Girth: Upper and lower limbs are symmetric  Pulses: Peripheral pulses are intact distally in the bilateral upper and lower limbs  Palpation: There is no pain on palpation of the cervical, thoracic, lumbosacral spine  Joints:  Functional range of motion in the bilateral upper and lower limbs, neck, and trunk with no evidence of instability or effusion. Restricted left hip PROM in internal rotation. No pain with left hip compression/circumduction, log roll or PROM.   Spine: Sustained Hip Flexion is positive, SLR on the left is negative, negative Braggard's sign, reverse SLR on the left reproduces left anterior thigh pain, Lumbar range of motion mildly restricted,   Neurologic:  Manual muscle testing 5/5 in the bilateral upper and lower limbs.  Muscle stretch reflexes 2+ in the bilateral upper and lower limbs.  Sensation intact to light touch in the bilateral upper and lower limbs.  Upper motor neuron signs negative in the bilateral upper and lower limbs.  Toes are down-going bilaterally, no clonus at the ankle bilaterally, Hoffman's sign negative bilaterally.      Lab Review:  No results found for this or any previous visit (from the past 240 hour(s)).      Radiology Review:  Left hip xrays dated: 12/02/16  Mild acetabular sclerosis. Well preserved joint space.     Magnetic resonance imaging of the lumbosacral spine dated:  07/13/16  L5S1 mod  desiccation, 25-50% loss of disc ht, grade 1 retrolisthesis, small broad based with left eccentric disc protrusion, mild left lateral recess stenosis, mild left and severe right L5 foraminal stenosis,   L45 mod desiccation, 25-50 % loss of disc ht, type I modic endplate changes, grade 1 anterolisthesis, broad based  protrusion, bilateral FJA  with LF buckling, mod CCS, mild/mod bilateral L4 foraminal stenosis   L34 mod desiccation, 25-50 % loss of disc ht, broad based protrusion, bilateral FJA with LF buckling, mild/mod CCS, mod bilateral L3 foraminal stenosis   L23 mod desiccation, < 25 % loss of disc ht    Flexion/extension lumbosacral xrays dated: 10/07/16  Loss of lumbar lordosis  Poor imaging quality limits interpretation  L5S1 50-75% loss of disc ht, grade 1 anterolisthesis   L45  50-75% loss of disc ht  L34 50-75% loss of disc ht  L23 25-50% loss of disc ht  No instability.           ODI: 34        Impression:  1. Axial lower back pain c/w IDD at L5S1 vs L45 vs L34 vs L5S1 vs L45 vs L34 painful facet joint arthrosis.   2. Bilateral, L>R, lower limb pain c/w somatic referral from #1 vs L5 vs S1 vs less likely, L4 radicular pain due to LSS.  3. Left groin pain c/w IA hip joint pathology vs less likely, L4 vs L3 vs L5 radicular pain due to LSS.   4. h/o osteoporosis - on Vit D supplementation.        Plan:  1. We will move forward with diagnostic left hip IA injection and if positive, 2-4 therapeutic left hip injections. If negative, we will move forward with diagnostic left L4 selective nerve root block and if positive, 2-4 therapeutic selective nerve root blocks in a similar fashion. If negative, diagnostic left L3 followed by diagnostic left L5 selective nerve root blocks and if positive, 2-4 therapeutic selective nerve root blocks in a similar fashion.      2. Risks and benefits of the procedures explained to the patient as well as the use of off label corticosteroid in the epidural space.  3. Continue home exercise program. Continue PRN APAP for PO analgesia.          Landry Mellow, DO    [facility]        Thank you for allowing me to be involved in the care of this patient.  If you have any questions please contact me at [facility] at [phone]    CC: Referring Provider, Flossie Dibble, MD

## 2016-12-24 ENCOUNTER — Encounter (INDEPENDENT_AMBULATORY_CARE_PROVIDER_SITE_OTHER): Payer: Self-pay | Admitting: Physical Medicine & Rehabilitation

## 2016-12-24 ENCOUNTER — Ambulatory Visit (INDEPENDENT_AMBULATORY_CARE_PROVIDER_SITE_OTHER): Payer: Medicare Other | Admitting: Physical Medicine & Rehabilitation

## 2016-12-24 DIAGNOSIS — M1612 Unilateral primary osteoarthritis, left hip: Secondary | ICD-10-CM

## 2016-12-24 MED ORDER — IOHEXOL 240 MG/ML IJ SOLN
50.0000 mL | Freq: Once | INTRAMUSCULAR | Status: AC
Start: 2016-12-24 — End: 2016-12-24
  Administered 2016-12-24: 50 mL

## 2016-12-24 NOTE — Procedures (Addendum)
Diagnostic IA Hip Joint injection  Procedure performed:  Diagnostic (LEFT) IA Hip joint injection.  Reason for procedure: Hip girdle pain  Surgeon: Dr. Landry Mellow  PROCEDURE: After obtaining informed consent, the patient was placed in the prone position on the fluoroscopy table. A time out was held for patient verification. Cycled one minute blood pressure, pulse ,and pulse oximetry was maintained. The skin was prepped and draped in a sterile fashion with Chloraprep and sterile towels. Conscious sedation was not administered. After raising a skin wheal with 1 percent Xylocaine and anesthetizing the subcutaneous tissues, a #22 gauge 3.5 inch spinal needle was placed under fluoroscopic control to and along the femoral neck. Approximately 1-2 milliliters of Omnipaque 240 contrast dye was injected, demonstrating IA spread without vascular uptake in the anteroposterior and oblique views. A total of 5 milliliters of 2 percent Xylocaine was instilled into the hip joint. The patient tolerated the procedure well and was transitioned to the post procedure holding area uneventfully. The patient was monitored for adverse allergic, paralytic and hypertensive reactions. The patient reported (100%) percent improvement in their index pain. The patient was discharged without complication.

## 2017-01-06 ENCOUNTER — Encounter (INDEPENDENT_AMBULATORY_CARE_PROVIDER_SITE_OTHER): Payer: Self-pay

## 2017-01-06 ENCOUNTER — Ambulatory Visit (INDEPENDENT_AMBULATORY_CARE_PROVIDER_SITE_OTHER): Payer: Medicare Other | Admitting: Physical Medicine & Rehabilitation

## 2017-01-09 ENCOUNTER — Ambulatory Visit (INDEPENDENT_AMBULATORY_CARE_PROVIDER_SITE_OTHER)
Admission: RE | Admit: 2017-01-09 | Discharge: 2017-01-09 | Disposition: A | Discharge status: Home / Self Care | Payer: Medicare Other | Source: Ambulatory Visit | Attending: Nurse Practitioner | Admitting: Nurse Practitioner

## 2017-01-09 ENCOUNTER — Encounter (INDEPENDENT_AMBULATORY_CARE_PROVIDER_SITE_OTHER): Payer: Self-pay

## 2017-01-09 ENCOUNTER — Ambulatory Visit (INDEPENDENT_AMBULATORY_CARE_PROVIDER_SITE_OTHER): Payer: Medicare Other | Admitting: Nurse Practitioner

## 2017-01-09 VITALS — BP 143/66 | HR 94 | Temp 98.1°F | Resp 18 | Ht 60.0 in | Wt 155.0 lb

## 2017-01-09 DIAGNOSIS — M25551 Pain in right hip: Secondary | ICD-10-CM

## 2017-01-09 NOTE — Patient Instructions (Signed)
Arthralgia    Arthralgia is the term for pain in or around the joint. It is a symptom, not a disease. This pain may involve one or more joints. In some cases, the pain moves from joint to joint.  There are many causes for joint pain. These include:   Injury   Osteoarthritis (wearing out of the joint surface)   Gout (inflammation of the joint due to crystals in the joint fluid)   Infection inside the joint    Bursitis(inflammation of the fluid-filled sacs around the joint)   Autoimmune disorders such as rheumatoid arthritis or lupus   Tendonitis (inflamation of chords that attach muscle to bone)  Home care   Rest the involved joint(s) until your symptoms improve.   You may be prescribed pain medication. If none is prescribed, you may use acetaminophen or ibuprofen to control pain and inflammation.  Follow up  Follow up with your healthcare provider or our staff as advised.  When to seek medical care  Contact your healthcare provider right away if any of the following occurs:   Pain,swelling, or redness ofjoint increases   Pain worsens or recurs after a period of improvement   Pain moves to other joints   You cannot bear weight on the affected joint   You cannot move the affected joint   Joint appears deformed   New rash appears   Fever of101F (38.8C)or higher, or as directed by your healthcare provider  Date Last Reviewed: 03/18/2014   2000-2016 [employer] [address], [place] [address]. All rights reserved. This information is not intended as a substitute for professional medical care. Always follow your healthcare professional's instructions.

## 2017-01-09 NOTE — Progress Notes (Signed)
Subjective:    Patient ID: Kelli Brown is a 83 y.o. female.    This patient is here today for evaluation of her right hip. She indicates that she sat down a few days ago and felt a pop in her right hip. SHe has a history of a fracture in 2010.      Hip Pain          The following portions of the patient's history were reviewed and updated as appropriate: allergies, current medications, past medical history, past social history, past surgical history and problem list.    Review of Systems   Musculoskeletal: Positive for arthralgias, back pain and myalgias.   Neurological: Negative for dizziness.   Psychiatric/Behavioral: Negative.          Objective:    BP 143/66   Pulse 94   Temp 98.1 F (36.7 C) (Oral)   Resp 18   Ht 1.524 m (5')   Wt 70.3 kg (155 lb)   BMI 30.27 kg/m     Physical Exam   Constitutional: She is oriented to person, place, and time. She appears well-developed and well-nourished.   HENT:   Head: Normocephalic and atraumatic.   Eyes: Pupils are equal, round, and reactive to light.   Musculoskeletal:        Right hip: She exhibits decreased range of motion, decreased strength, tenderness and bony tenderness. She exhibits no swelling, no crepitus and no deformity.   Pain over GT area. No swelling or ecchymosis. Pain with SLR. No pain with internal or external rotation of the hip joint. Her pain does not radiate.   Neurological: She is alert and oriented to person, place, and time.   Skin: Skin is warm.   Psychiatric: She has a normal mood and affect. Her behavior is normal. Judgment and thought content normal.         Assessment and Plan:       Monti was seen today for hip pain.    Diagnoses and all orders for this visit:    Right hip pain  -     X-ray femur right AP and lateral; Future    Kelli Brown is here today for pain in the lateral hip after feeling a pop sitting down a few days ago. She has a history of fracture with ORIF by Dr. Weston Settle in 2010. SHe is ambulatory today with a walker.  There are no s/s of infection to the hip. Xray completed for prosthetic evaluation, there is no visible fracture on xray. Her prosthetic looks well fixed without complication. Recommend OTC tylenol around the clock for pain, ice and heat. She should continue use of her walker at all times. For continued pain next week she can follow up with [facility] for further evaluation.        Letta Moynahan, NP  [facility]  01/09/2017  9:29 AM

## 2017-01-10 ENCOUNTER — Emergency Department: Payer: Medicare Other

## 2017-01-10 ENCOUNTER — Emergency Department
Admission: EM | Admit: 2017-01-10 | Discharge: 2017-01-10 | Disposition: A | Discharge status: Home / Self Care | Payer: Medicare Other | Attending: Emergency Medicine | Admitting: Emergency Medicine

## 2017-01-10 DIAGNOSIS — I10 Essential (primary) hypertension: Secondary | ICD-10-CM | POA: Insufficient documentation

## 2017-01-10 DIAGNOSIS — R05 Cough: Secondary | ICD-10-CM | POA: Insufficient documentation

## 2017-01-10 DIAGNOSIS — B349 Viral infection, unspecified: Secondary | ICD-10-CM | POA: Insufficient documentation

## 2017-01-10 DIAGNOSIS — R112 Nausea with vomiting, unspecified: Secondary | ICD-10-CM | POA: Insufficient documentation

## 2017-01-10 DIAGNOSIS — R531 Weakness: Secondary | ICD-10-CM | POA: Insufficient documentation

## 2017-01-10 DIAGNOSIS — E785 Hyperlipidemia, unspecified: Secondary | ICD-10-CM | POA: Insufficient documentation

## 2017-01-10 DIAGNOSIS — R197 Diarrhea, unspecified: Secondary | ICD-10-CM | POA: Insufficient documentation

## 2017-01-10 LAB — VH INFLUENZA A/B RAPID TEST
Influenza A: NEGATIVE
Influenza B: NEGATIVE

## 2017-01-10 MED ORDER — DICYCLOMINE HCL 10 MG PO CAPS
ORAL_CAPSULE | ORAL | Status: AC
Start: 2017-01-10 — End: ?
  Filled 2017-01-10: qty 1

## 2017-01-10 MED ORDER — DICYCLOMINE HCL 10 MG PO CAPS
10.0000 mg | ORAL_CAPSULE | Freq: Four times a day (QID) | ORAL | Status: DC
Start: 2017-01-10 — End: 2017-01-11
  Administered 2017-01-10: 10 mg via ORAL

## 2017-01-10 NOTE — Discharge Instructions (Signed)
Treating Diarrhea    Diarrhea happens when you have loose, watery, or frequent bowel movements. It is a common problem with many causes. Most cases of diarrhea clear up on their own. But certain cases may need treatment. Be sure to see your healthcare providerif your symptoms do not improve within a few days.  Getting relief  Treatment of diarrhea depends on its cause. Diarrhea caused by bacterial or parasite infection is often treated with antibiotics. Diarrhea caused by other factors, such as a stomach virus, often improves with simple home treatment. The tips below may also help relieve your symptoms.   Drink plenty of fluids. This helps prevent too much fluid loss (dehydration). Water, clear soups, and electrolyte solutions are good choices. Avoid alcohol, coffee, tea, and milk. These can irritate your intestines andmake symptoms worse.   Suck on ice chips if drinking makes you queasy.   Return to your normal diet slowly. You may want to eat bland foods at first, such as rice and toast. Also, you may need to avoid certain foods for a while, such as dairy products. These can make symptoms worse. Ask yourhealthcare providerif there are any other foods you should avoid.   If you were prescribed antibiotics, take them as directed.   Do not take anti-diarrhea medicines without asking yourhealthcare providerfirst.  Call your healthcare provider  Call your healthcare provider if you have any of the following:   A fever of 100.4F (38.0C) or higher, or as directed by your healthcare provider   Severe pain   Worsening diarrhea or diarrhea for more than 2 days   Bloody vomit or stool   Signs of dehydration (dizziness, dry mouth and tongue, rapid pulse, dark urine)  Date Last Reviewed: 05/24/2015   2000-2017 [employer] [address], [place] [address]. All rights reserved. This information is not intended as a substitute for professional medical care. Always follow your  healthcare professional's instructions.

## 2017-01-10 NOTE — ED Provider Notes (Signed)
[facility]  EMERGENCY DEPARTMENT  History and Physical Exam     Patient Name: Kelli Brown, Kelli Brown  Encounter Date:  01/10/2017  Attending Physician: Molly Maduro L. Kenyon Ana., DO.  Room:  S23/S23-A  Patient DOB:  [birth year]  Age: 83 y.o. female  MRN:  [id number]  PCP: Flossie Dibble, MD      Diagnosis/Disposition:  MDM:     Final Impression  1. Viral syndrome    2. Diarrhea, unspecified type        Disposition  ED Disposition     ED Disposition Condition Date/Time Comment    Discharge  Sun Jan 10, 2017 10:29 PM Kelli Brown discharge to home/self care.    Condition at disposition: Stable          Follow up  Flossie Dibble, MD  [address]  [place] [address]  [phone]      As needed        ED Summary:    Patient here with reported flulike symptoms however patient states she is here simply because she has diarrhea.  Flu swab was negative chest x-ray was negative.  In the 4 hours the patient was here she is unable to produce stool sample.  Patient is hemodynamically stable this is probably consistent with gastroenteritis which is going around.  She was given Bentyl prior to discharge and can use over-the-counter diarrhea medicines as needed at nursing home.    Differential DX:      The patient presents with diarrhea, now improved, and is clinically well appearing without fever or pain.  The abdomen is soft and non-tender and the patient is tolerating po well.  At present, clinical suspicion for colitis, C. Diff infection, obstruction, severe electrolyte derangement, or other serious intra-abdominal condition is low.   If performed resulted labs and images were reviewed and discussed with patient and/or family. Diarrhea precautions have been given, diet recommendations were discussed, and the patient is stable for discharge. The patient is warned to return immediately for worsening symptoms including pain or fever, or any other acute concerns, and to follow-up with PCP as needed and for any  pending lab results.  All questions were answered and concerns addressed.    ED Course as of Jan 10 2245   Wynelle Link Jan 10, 2017   2229 Pt still unable to produce a stool sample  [MH]      ED Course User Index  [MH] Loleta Chance       The results of diagnostic studies have been reviewed by myself. Available past medical, family, social, and surgical histories have been reviewed by myself. The clinical impression and plan have been discussed with the patient and/or the patient's family. All questions have been answered.      History of Presenting Illness:     Chief complaint: Flu like symptoms and Diarrhea      Kelli Brown is a 83 y.o. female presenting to ED room 23 by EMS from [facility] nursing home with the complaint of having multiple episodes of diarrhea that started today.  Pt reports that every time she got up she would have a runny stool.  She had one "minor" episode of vomiting today as well.  She has not felt well for the past couple of days. She denies fever, CP, SOB and she has no other complaints at this time.        Review of Systems:  Physical Exam:     Review  of Systems   Constitutional: Negative for fever.   HENT: Negative for congestion.    Respiratory: Positive for cough. Negative for shortness of breath.    Cardiovascular: Negative for chest pain.   Gastrointestinal: Positive for diarrhea, nausea and vomiting. Negative for abdominal pain.   Genitourinary: Negative for difficulty urinating.   Musculoskeletal: Negative.    Skin: Negative.    Neurological: Positive for weakness. Negative for headaches.     Vitals:    01/10/17 1918   BP: 147/66   Pulse: (!) 116   Resp: 18   Temp: 98.7 F (37.1 C)   TempSrc: Oral   SpO2: 95%   Weight: 68.9 kg   Height: 1.524 m          Physical Exam   Constitutional: She is oriented to person, place, and time. She appears well-developed and well-nourished. No distress.   HENT:   Head: Normocephalic and atraumatic.   Eyes: Conjunctivae are normal. Pupils are  equal, round, and reactive to light. Right eye exhibits no discharge. Left eye exhibits no discharge.   Neck: Normal range of motion. Neck supple.   Cardiovascular: Normal rate, regular rhythm and intact distal pulses.    Pulmonary/Chest: Effort normal. No respiratory distress.   Abdominal: Soft. She exhibits no distension. There is no tenderness. There is no rebound.   Hyperactive BS   Musculoskeletal: Normal range of motion.   Neurological: She is alert and oriented to person, place, and time.   Skin: Skin is warm and dry. She is not diaphoretic.   Psychiatric: She has a normal mood and affect. Her behavior is normal.   Nursing note and vitals reviewed.         Allergies & Medications:     Pt is allergic to codeine; darvon [propoxyphene]; and phenobarbital.    Current/Home Medications    ASPIRIN EC 81 MG EC TABLET    Take 81 mg by mouth daily.    CALCIUM CARBONATE 1500 (600 CA) MG TAB TABLET    Take 600 mg by mouth daily.        CHOLECALCIFEROL (VITAMIN D) 1000 UNIT TABLET    Take 1,000 Units by mouth daily.    DORZOLAMIDE (TRUSOPT) 2 % OPHTHALMIC SOLUTION    Place 1 drop into the left eye 3 (three) times daily.       FUROSEMIDE (LASIX) 40 MG TABLET    Take 40 mg by mouth daily.Take daily Mon-Friday        KLOR-CON M 20 MEQ TABLET    Take 20 mEq by mouth daily.       METOPROLOL XL (TOPROL-XL) 50 MG 24 HR TABLET    Take 50 mg by mouth daily.       MULTIPLE VITAMINS-MINERALS (MULTIVITAMIN WITH MINERALS) TABLET    Take 1 tablet by mouth daily.    PRAVASTATIN (PRAVACHOL) 40 MG TABLET    Take 40 mg by mouth daily.          Prescriptions  New Prescriptions    No medications on file       ED medications  ED Medication Orders     Start Ordered     Status Ordering Provider    01/10/17 2230 01/10/17 2229  dicyclomine (BENTYL) capsule 10 mg  4 times daily     Route: Oral  Ordered Dose: 10 mg     Last MAR action:  Given Cailie Bosshart LEE JR.  Past History:     Medical: Pt has a past medical history of  Abnormal vision; Arthritis; Hip fx, right, closed, initial encounter (2010); Hyperlipidemia; Hypertension; Low back pain; Macular degeneration; and Shingles.    Surgical: Pt  has a past surgical history that includes Appendectomy; Tonsillectomy; Hip surgery (Bilateral); Eye surgery; and Tubal ligation.    Family: The family history is not on file.    Social: Pt reports that she has never smoked. She has never used smokeless tobacco. She reports that she does not drink alcohol or use drugs.        Diagnostic Results:     Radiologic Studies  Xr Chest 2 Views    Result Date: 01/10/2017  No evidence of acute airspace disease. ReadingStation:[id number]      Lab Studies  Labs Reviewed   VH INFLUENZA A/B RAPID TEST    Narrative:     Influenza A antigen detection tests are unable to distinquish between novel and seasonal influenza A.    A negative result for either Influenza A or B antigen does not exclude influenza virus infection. Clinical correlation required.    All positive influenza antigen tests (A or B) require placement of patient on droplet precaution isolation.         Procedure/EKG:     EKG:   Last EKG Result     None          Rhythm Strip Interpretation:        ATTESTATIONS     Tanis Burnley L. Kenyon Ana., DO    The documentation may have been recorded by my scribe, Loleta Chance , and reflects the services I personally performed and the decisions made by me.  Edrick Kins., DO    The results of diagnostic studies have been reviewed by myself. The above past medical, family, social, and surgical histories have been reviewed by myself. The clinical impression and plan have been discussed with the patient and/or the patient's family. All questions have been answered.    Note: This chart was generated by an EMR and may contain errors, including typographical, or omissions not intended by the user. This chart was generated by the Epic EMR system/speech recognition and may contain inherent errors or omissions not  intended by the user. Grammatical errors, random word insertions, deletions, pronoun errors and incomplete sentences are occasional consequences of this technology due to software limitations. Not all errors are caught or corrected. If there are questions or concerns about the content of this note or information contained within the body of this dictation they should be addressed directly with the author for clarification            Edrick Kins., DO  01/10/17 2246

## 2017-01-10 NOTE — ED Notes (Signed)
Bed: S23-A  Expected date:   Expected time:   Means of arrival:   Comments:  EMS

## 2017-01-12 ENCOUNTER — Telehealth (INDEPENDENT_AMBULATORY_CARE_PROVIDER_SITE_OTHER): Payer: Self-pay

## 2017-01-12 NOTE — Telephone Encounter (Signed)
Talked with Patient She states not she is not eating. Patient had no further questions or concerns at this time

## 2017-01-13 ENCOUNTER — Encounter (INDEPENDENT_AMBULATORY_CARE_PROVIDER_SITE_OTHER): Payer: Self-pay | Admitting: Physical Medicine & Rehabilitation

## 2017-01-13 ENCOUNTER — Ambulatory Visit (INDEPENDENT_AMBULATORY_CARE_PROVIDER_SITE_OTHER): Payer: Medicare Other | Admitting: Physical Medicine & Rehabilitation

## 2017-01-13 DIAGNOSIS — M1612 Unilateral primary osteoarthritis, left hip: Secondary | ICD-10-CM

## 2017-01-13 MED ORDER — TRIAMCINOLONE ACETONIDE 40 MG/ML IJ SUSP
80.0000 mg | Freq: Once | INTRAMUSCULAR | Status: AC
Start: 2017-01-13 — End: 2017-01-13
  Administered 2017-01-13: 80 mg

## 2017-01-13 MED ORDER — IOHEXOL 240 MG/ML IJ SOLN
50.0000 mL | Freq: Once | INTRAMUSCULAR | Status: AC
Start: 2017-01-13 — End: 2017-01-13
  Administered 2017-01-13: 50 mL

## 2017-01-13 NOTE — Procedures (Signed)
Diagnostic IA Hip Joint injection  Procedure performed:  left (left) IA Hip joint injection.  Reason for procedure: Hip girdle pain  Surgeon: Dr. Landry Mellow  PROCEDURE: After obtaining informed consent, the patient was placed in the prone position on the fluoroscopy table. A time out was held for patient verification. Cycled one minute blood pressure, pulse ,and pulse oximetry was maintained. The skin was prepped and draped in a sterile fashion with Chloraprep and sterile towels. Conscious sedation was not administered. After raising a skin wheal with 1 percent Xylocaine and anesthetizing the subcutaneous tissues, a #22 gauge 3.5 inch spinal needle was placed under fluoroscopic control to and along the femoral neck. Approximately 1-2 milliliters of Omnipaque 240 contrast dye was injected, demonstrating IA spread without vascular uptake in the anteroposterior and oblique views. A total of 3 milliliters of 1 percent Xylocaine plus 2 milliliters of 10mg /ml kenalog was instilled into the hip joint. The patient tolerated the procedure well and was transitioned to the post procedure holding area uneventfully. The patient was monitored for adverse allergic, paralytic and hypertensive reactions.

## 2017-01-20 ENCOUNTER — Ambulatory Visit (INDEPENDENT_AMBULATORY_CARE_PROVIDER_SITE_OTHER): Payer: Medicare Other | Admitting: Physical Medicine & Rehabilitation

## 2017-01-27 ENCOUNTER — Encounter (INDEPENDENT_AMBULATORY_CARE_PROVIDER_SITE_OTHER): Payer: Self-pay | Admitting: Physical Medicine & Rehabilitation

## 2017-01-27 ENCOUNTER — Ambulatory Visit (INDEPENDENT_AMBULATORY_CARE_PROVIDER_SITE_OTHER): Payer: Medicare Other | Admitting: Physical Medicine & Rehabilitation

## 2017-01-27 DIAGNOSIS — M1612 Unilateral primary osteoarthritis, left hip: Secondary | ICD-10-CM

## 2017-01-27 MED ORDER — TRIAMCINOLONE ACETONIDE 40 MG/ML IJ SUSP
80.0000 mg | Freq: Once | INTRAMUSCULAR | Status: AC
Start: 2017-01-27 — End: 2017-01-27
  Administered 2017-01-27: 80 mg

## 2017-01-27 MED ORDER — IOHEXOL 240 MG/ML IJ SOLN
50.0000 mL | Freq: Once | INTRAMUSCULAR | Status: AC
Start: 2017-01-27 — End: 2017-01-27
  Administered 2017-01-27: 50 mL

## 2017-01-27 NOTE — Procedures (Signed)
Diagnostic IA Hip Joint injection  Procedure performed:  left (left) IA Hip joint injection.  Reason for procedure: Hip girdle pain  Surgeon: Dr. Landry Mellow  PROCEDURE: After obtaining informed consent, the patient was placed in the prone position on the fluoroscopy table. A time out was held for patient verification. Cycled one minute blood pressure, pulse ,and pulse oximetry was maintained. The skin was prepped and draped in a sterile fashion with Chloraprep and sterile towels. Conscious sedation was not administered. After raising a skin wheal with 1 percent Xylocaine and anesthetizing the subcutaneous tissues, a #22 gauge 3.5 inch spinal needle was placed under fluoroscopic control to and along the femoral neck. Approximately 1-2 milliliters of Omnipaque 240 contrast dye was injected, demonstrating IA spread without vascular uptake in the anteroposterior and oblique views. A total of 3 milliliters of 1 percent Xylocaine plus 2 milliliters of 10mg /ml kenalog was instilled into the hip joint. The patient tolerated the procedure well and was transitioned to the post procedure holding area uneventfully. The patient was monitored for adverse allergic, paralytic and hypertensive reactions.

## 2017-02-02 ENCOUNTER — Other Ambulatory Visit: Payer: Self-pay | Admitting: Family Medicine

## 2017-02-02 ENCOUNTER — Encounter: Payer: Self-pay | Admitting: Family Medicine

## 2017-02-02 ENCOUNTER — Ambulatory Visit
Admission: RE | Admit: 2017-02-02 | Discharge: 2017-02-02 | Disposition: A | Discharge status: Home / Self Care | Payer: Medicare Other | Source: Ambulatory Visit | Attending: Family Medicine | Admitting: Family Medicine

## 2017-02-02 DIAGNOSIS — M546 Pain in thoracic spine: Secondary | ICD-10-CM | POA: Insufficient documentation

## 2017-02-02 DIAGNOSIS — M5134 Other intervertebral disc degeneration, thoracic region: Secondary | ICD-10-CM | POA: Insufficient documentation

## 2017-02-10 ENCOUNTER — Ambulatory Visit (INDEPENDENT_AMBULATORY_CARE_PROVIDER_SITE_OTHER): Payer: Medicare Other | Admitting: Physical Medicine & Rehabilitation

## 2017-02-15 ENCOUNTER — Emergency Department: Payer: Medicare Other

## 2017-02-15 ENCOUNTER — Emergency Department
Admission: EM | Admit: 2017-02-15 | Discharge: 2017-02-15 | Disposition: A | Discharge status: Home / Self Care | Payer: Medicare Other | Attending: Emergency Medicine | Admitting: Emergency Medicine

## 2017-02-15 DIAGNOSIS — E785 Hyperlipidemia, unspecified: Secondary | ICD-10-CM | POA: Insufficient documentation

## 2017-02-15 DIAGNOSIS — I10 Essential (primary) hypertension: Secondary | ICD-10-CM

## 2017-02-15 DIAGNOSIS — R109 Unspecified abdominal pain: Secondary | ICD-10-CM | POA: Insufficient documentation

## 2017-02-15 DIAGNOSIS — M549 Dorsalgia, unspecified: Secondary | ICD-10-CM

## 2017-02-15 DIAGNOSIS — X58XXXA Exposure to other specified factors, initial encounter: Secondary | ICD-10-CM | POA: Insufficient documentation

## 2017-02-15 DIAGNOSIS — S32030A Wedge compression fracture of third lumbar vertebra, initial encounter for closed fracture: Secondary | ICD-10-CM

## 2017-02-15 MED ORDER — HYDROCODONE-ACETAMINOPHEN 5-325 MG PO TABS
1.0000 | ORAL_TABLET | Freq: Once | ORAL | Status: AC
Start: 2017-02-15 — End: 2017-02-15
  Administered 2017-02-15: 1 via ORAL

## 2017-02-15 MED ORDER — HYDROCODONE-ACETAMINOPHEN 5-325 MG PO TABS
1.0000 | ORAL_TABLET | Freq: Four times a day (QID) | ORAL | 0 refills | Status: DC | PRN
Start: 2017-02-15 — End: 2017-03-19

## 2017-02-15 MED ORDER — ONDANSETRON HCL 4 MG/2ML IJ SOLN
4.0000 mg | Freq: Once | INTRAMUSCULAR | Status: DC
Start: 2017-02-15 — End: 2017-02-15

## 2017-02-15 MED ORDER — ONDANSETRON 4 MG PO TBDP
8.0000 mg | ORAL_TABLET | Freq: Once | ORAL | Status: AC
Start: 2017-02-15 — End: 2017-02-15
  Administered 2017-02-15: 8 mg via ORAL

## 2017-02-15 MED ORDER — ONDANSETRON 4 MG PO TBDP
ORAL_TABLET | ORAL | Status: AC
Start: 2017-02-15 — End: ?
  Filled 2017-02-15: qty 2

## 2017-02-15 MED ORDER — HYDROCODONE-ACETAMINOPHEN 5-325 MG PO TABS
ORAL_TABLET | ORAL | Status: AC
Start: 2017-02-15 — End: ?
  Filled 2017-02-15: qty 1

## 2017-02-15 NOTE — Discharge Instructions (Signed)
Back Fracture (Compression Fracture)  Your spine stretches from the base of your skull to your tailbone. It's composed of 33 bones (vertebrae) stacked on top of one another. These bones are strong enough to support the weight of your upper body. Certain injuries, however, can damage one or more of the vertebrae and cause them to collapse. A collapsed bone in your spine is known as a compression fracture.    Causes of compression fracture  Many compression fractures result from osteoporosis. This disease thins your bones so they can't withstand normal pressure. Trauma from a car accident or hard fall can fracture even healthy vertebrae. In rare cases, vertebrae may fracture for unknown reasons.  When to go to the emergency room (ER)  Call 911 if you've been in an accident or had a fall and have neck or back pain, especially when pain occurs with any of these symptoms:   Loss of control over your bowels or bladder   Numbness or weakness   High fever   Unexplained back pain in a person with cancer  What to expect in the ER  Ahealthcare providerwill ask about your health history and examine you. In some cases, you may have X-rays. You also may have other tests, such as a CT scan or MRI. These tests can provide detailed images of your bones and spinal cord.  Treatment  Treatment will depend on the type and cause of the fracture. You will be givenmedicine for pain. Severe fractures or those that cause nerve problems may need surgery. Many compression fractures mend on their own.  Follow-up  As you improve, you may be given exercises to strengthen your bones. If you have osteoporosis, your healthcare providermay prescribe amedicine to treat it. Sometimes you may have pain even after the bone has healed. In that case, yourhealthcare providerwill discuss your  Date Last Reviewed: 08/22/2014   2000-2017 [employer] [address], [place] [address]. All rights reserved. This information is  not intended as a substitute for professional medical care. Always follow your healthcare professional's instructions.

## 2017-02-15 NOTE — ED Provider Notes (Signed)
Physician/Midlevel provider first contact with patient: 02/15/17 1010         EMERGENCY DEPARTMENT HISTORY AND PHYSICAL EXAM      Date Time: 02/15/17 10:22 AM  Patient Name: Kelli Brown  Attending Physician: Marvene Staff, MD    Assessment/Plan:   Exacerbation of back pain  New, age-indeterminate (but within past 6 months or so) L3 compression fracture  Chronic L4 compression  Rx Norco 1 qid prn #15; she tolerated a dose in the ER without problems.  Patient has appointment next week with Dr Juanda Crumble (PM&R) for follow up      MDM     This patient presents with back pain that seems musculoskeletal in origin. Based on my history and examination, several diagnoses including cauda equina syndrome, infection, AAA, kidney stones, have been considered as unlikely. She has pain in the spine as well as referred pain, possibly radicular, to both legs. CT shows an L3 compression that is new compared to an MRI from last summer. This may be related to her recent pain in the past month. I will treat for pain, Rx Norco but discussed avoiding it except for the worst of the pain. She appears to have good pain tolerance and understanding of the side effects (constipation, weakness, risk of fall). Kyphoplasty may become an option if the compression worsens. I advised she discuss the CT findings with her PM&R physician, Dr. Juanda Crumble.     History of Presenting Illness:   History provided by:  Patient  Kelli Brown is a 83 y.o. female     Patient presents for back pain; leg pain.  Context: Patient states h/o chronic back pain and arthritis that worsened over the last 1 month.  She states "I can hardly do anything right now" which prompted her visit today (03/26).  Denies any recent falls or injury.  Patient states shooting pain into both lower extremities.  She states tingling in her lower extremities.  Patient states she has been taking Tylenol XS PRN without significant improvement.  Patient sees pain management with  her last injection occurring approx 2 weeks ago but states this hasn't helped her.  [facility] from [facility].  Location: back   Quality: aching, shooting, cosntant  Radiation: pain radiates into both lower extremities  Severity: 8/10  Duration: 1 month; worse today (03/26)  Associated signs/symptoms: none  Exacerbating/mitigating factors: movement increases pain      Past Medical History:     Past Medical History:   Diagnosis Date   . Abnormal vision    . Arthritis    . Hip fx, right, closed, initial encounter 2010   . Hyperlipidemia    . Hypertension    . Low back pain    . Macular degeneration    . Shingles        Past Surgical History:     Past Surgical History:   Procedure Laterality Date   . APPENDECTOMY     . EYE SURGERY      cateracts   . HIP SURGERY Bilateral     Tubes tied   . TONSILLECTOMY     . TUBAL LIGATION         Family History:   History reviewed. No pertinent family history.    Social History:     Social History     Social History   . Marital status: Widowed     Spouse name: N/A   . Number of children: N/A   .  Years of education: N/A     Social History Main Topics   . Smoking status: Never Smoker   . Smokeless tobacco: Never Used   . Alcohol use No   . Drug use: No   . Sexual activity: Not Currently     Birth control/ protection: Post-menopausal     Other Topics Concern   . Not on file     Social History Narrative   . No narrative on file       Allergies:     Allergies   Allergen Reactions   . Codeine Nausea And Vomiting and Other (See Comments)     LIGHTHEADEDNESS.   . Darvon [Propoxyphene] Nausea And Vomiting and Other (See Comments)     LIGHTHEADEDNESS.   Marland Kitchen Phenobarbital Swelling and Rash       Medications:     Previous Medications    ASPIRIN EC 81 MG EC TABLET    Take 81 mg by mouth daily.    CALCIUM CARBONATE 1500 (600 CA) MG TAB TABLET    Take 600 mg by mouth daily.        CHOLECALCIFEROL (VITAMIN D) 1000 UNIT TABLET    Take 1,000 Units by mouth daily.    DORZOLAMIDE (TRUSOPT) 2  % OPHTHALMIC SOLUTION    Place 1 drop into the left eye 3 (three) times daily.       FUROSEMIDE (LASIX) 40 MG TABLET    Take 40 mg by mouth daily.Take daily Mon-Friday        KLOR-CON M 20 MEQ TABLET    Take 20 mEq by mouth daily.       METOPROLOL XL (TOPROL-XL) 50 MG 24 HR TABLET    Take 50 mg by mouth daily.       MULTIPLE VITAMINS-MINERALS (MULTIVITAMIN WITH MINERALS) TABLET    Take 1 tablet by mouth daily.    PRAVASTATIN (PRAVACHOL) 40 MG TABLET    Take 40 mg by mouth daily.           Review of Systems:   Constitutional:  No fever.  No chills    Ears:  No ear pain.    Nose:  No congestion.  No discharge    Throat:  No sore throat.  No difficulty swallowing.    Cardiovascular: No chest pain.  No palpitations.    Respiratory: No cough.  No shortness of breath.    GI:  No abdominal pain.  No nausea.  No vomiting.  No diarrhea.    GU:  No dysuria.    Neurological:  No headache.  No weakness.    Musculoskeletal:  +Back pain. Multiple joint pains    Skin:  No rash.  No skin lesions.    All other systems reviewed and negative except as above, pertinent findings in HPI.      Physical Exam:   Blood pressure 102/62, pulse (!) 103, temperature 97 F (36.1 C), temperature source Oral, resp. rate 18, SpO2 95 %.  Constitutional:  Vitals signs reviewed. Well-appearing.  Well-nourished. No acute distress.    Head:  Atraumatic, normocephalic    Eyes:  Pupils equal, round, and reactive to light.  Conjunctiva no injection or erythema    Nose:  Mucous membranes moist.  No discharge.     Mouth & Throat:   No erythema.  No exudates     Neck:  Supple, non tender.  No cervical lymphadenopathy.    Respiratory:  Breath sounds normal.  No distress    Cardiovascular:  Heart regular rate and rhythm.  II/VI systolic murmur. No gallops/rubs.  Pulses +2 bilaterally    Abdomen:  Soft. Non-tender. No distension.    Back:  Right sided CVA tenderness, lumbar tenderness    Extremities:  Full range of motion.  No edema.  No cyanosis.  No  deformity.    Skin:  Warm.  Dry.  No pallor.  No rashes.  No lesions.  No bruises    Neurological:  Alert. Oriented to person,place, time.  GCS 15.  No focal motor deficits.      Psychiatric:  Normal affect.  No anxiety.  No depression.  No agitation.      Lab Results   Labs Reviewed - No data to display    Radiology Results     CT Abdomen Pelvis WO IV/ WO PO Cont   Final Result   There are no urinary stones or hydroureteronephrosis. The study is markedly limited to assess for other pathologies. There is an interval L3 inferior endplate compression fracture when compared with MRI from August 2017. This could be acute or subacute.    There is a chronic L4 compression fracture. I cannot exclude pneumonia in both lung bases, but this could be due to volume loss or scarring. Multiple other incidental findings are detailed above.      ReadingStation:[email or web address]          Labs and Radiological Studies Reviewed    Final Impression     Final diagnoses:   Back pain, unspecified back location, unspecified back pain laterality, unspecified chronicity   Closed compression fracture of third lumbar vertebra, initial encounter       Disposition     ED Disposition     ED Disposition Condition Date/Time Comment    Discharge  Mon Feb 15, 2017  1:32 PM Kelli Brown discharge to home/self care.    Condition at disposition: Stable          Follow-Up Provider   Christoper Allegra, DO  [address]  [place] [address]  [phone]    Call            ATTESTATIONS    The documentation recorded by my scribe, [place], accurately reflects the services I personally performed and the decisions made by me.  Nakema Fake Ames Dura, MD                Gery Sabedra Ames Dura, MD         Marvene Staff, MD  02/15/17 [phone]

## 2017-02-15 NOTE — ED Triage Notes (Signed)
Patient was brought into the ED c/o back and leg pain that has been going for about a month and progressively gotten worse. Patient is A&Ox4. Patient stated she has been taking tylenol extra strength w/o relief. Patient is ambulatory. Patient states that her doctor wanted her to come to the ED for further testing, PCP has given patient "shots" for the pain but nothing has helped.

## 2017-02-15 NOTE — ED Notes (Signed)
Bed: N3-A  Expected date:   Expected time:   Means of arrival:   Comments:  ems

## 2017-02-22 ENCOUNTER — Other Ambulatory Visit
Admission: RE | Admit: 2017-02-22 | Discharge: 2017-02-22 | Disposition: A | Discharge status: Home / Self Care | Payer: Medicare Other | Source: Ambulatory Visit | Attending: Physician Assistant | Admitting: Physician Assistant

## 2017-02-22 ENCOUNTER — Encounter (INDEPENDENT_AMBULATORY_CARE_PROVIDER_SITE_OTHER): Payer: Self-pay | Admitting: Physical Medicine & Rehabilitation

## 2017-02-22 ENCOUNTER — Other Ambulatory Visit
Admission: RE | Admit: 2017-02-22 | Discharge: 2017-02-22 | Disposition: A | Discharge status: Home / Self Care | Payer: Medicare Other | Source: Ambulatory Visit

## 2017-02-22 ENCOUNTER — Ambulatory Visit (INDEPENDENT_AMBULATORY_CARE_PROVIDER_SITE_OTHER): Payer: Medicare Other | Admitting: Physical Medicine & Rehabilitation

## 2017-02-22 VITALS — BP 149/76 | HR 115

## 2017-02-22 DIAGNOSIS — M5416 Radiculopathy, lumbar region: Secondary | ICD-10-CM

## 2017-02-22 DIAGNOSIS — M5136 Other intervertebral disc degeneration, lumbar region: Secondary | ICD-10-CM

## 2017-02-22 DIAGNOSIS — Z8739 Personal history of other diseases of the musculoskeletal system and connective tissue: Secondary | ICD-10-CM

## 2017-02-22 DIAGNOSIS — M79604 Pain in right leg: Secondary | ICD-10-CM

## 2017-02-22 DIAGNOSIS — M5417 Radiculopathy, lumbosacral region: Secondary | ICD-10-CM

## 2017-02-22 DIAGNOSIS — M545 Low back pain: Secondary | ICD-10-CM

## 2017-02-22 DIAGNOSIS — M5137 Other intervertebral disc degeneration, lumbosacral region: Secondary | ICD-10-CM

## 2017-02-22 DIAGNOSIS — M79605 Pain in left leg: Secondary | ICD-10-CM

## 2017-02-22 LAB — VH URINALYSIS WITH MICROSCOPIC
Bilirubin, UA: NEGATIVE
Blood, UA: NEGATIVE
Glucose, UA: NEGATIVE mg/dL
Ketones UA: NEGATIVE mg/dL
Leukocyte Esterase, UA: 250 Leu/uL — AB
Nitrite, UA: NEGATIVE
Protein, UR: 30 mg/dL — AB
RBC, UA: 1 /hpf (ref 0–5)
Squam Epithel, UA: 2 /hpf (ref 0–2)
Urine Specific Gravity: 1.025 (ref 1.001–1.040)
Urobilinogen, UA: 2 mg/dL — AB
WBC, UA: 15 /hpf — ABNORMAL HIGH (ref 0–4)
pH, Urine: 5 pH (ref 5.0–8.0)

## 2017-02-22 NOTE — Progress Notes (Signed)
Follow-up visit      Patient Name:                                                 Kelli Brown, Kelli Brown  DOB  1919-01-20  MRN  [id number]    Referring Physician: Dr. Tylene Fantasia      History of Present Illness:    As you know, Teleshia Lemere Huck  is a pleasant 83 y.o., female with the chief complaint of axial low back pain located in the midline and bilateral, right greater than left, paramidline lumbosacral region and less severe, bilateral, left greater than right, lateral thigh.  The pain is a 7 out of 10 in severity, and aching, dull in quality.  Onset gradual, 15 years duration, worsening over the past 108-months.  Exacerbating factors include standing and walking.  Alleviating factors include sitting . Associated symptoms are denied by the patient.     Today the patient reports 80% improvement in her index lower back pain s/p bilateral S1 TFESI (#2 - 12/09/16). She reports more severe left groin and anterior/medial thigh pain, 5 weeks duration. Spontaneous and gradual onset. She reports pain is significant functionally limiting and interfering with mobility.       Prior Treatments: Physical therapy, epidural steroid injections    Prior Medications: Gabapentin, PRN Tylenol, PRN Hydrocodone    Current Medications:    Current Outpatient Prescriptions:   .  aspirin EC 81 MG EC tablet, Take 81 mg by mouth daily., Disp: , Rfl:   .  calcium carbonate 1500 (600 Ca) MG Tab tablet, Take 600 mg by mouth daily.  , Disp: , Rfl:   .  Cholecalciferol (VITAMIN D) 1000 UNIT tablet, Take 1,000 Units by mouth daily., Disp: , Rfl:   .  dorzolamide (TRUSOPT) 2 % ophthalmic solution, Place 1 drop into the left eye 3 (three) times daily.  , Disp: , Rfl:   .  furosemide (LASIX) 40 MG tablet, Take 40 mg by mouth daily.Take daily Mon-Friday  , Disp: , Rfl:   .  HYDROcodone-acetaminophen (NORCO) 5-325 MG per tablet, Take 1 tablet by mouth every 6 (six) hours as needed for Pain., Disp: 15 tablet, Rfl: 0  .  KLOR-CON M 20 MEQ tablet, Take 20  mEq by mouth daily.  , Disp: , Rfl:   .  metoprolol XL (TOPROL-XL) 50 MG 24 hr tablet, Take 50 mg by mouth daily.  , Disp: , Rfl:   .  Multiple Vitamins-Minerals (MULTIVITAMIN WITH MINERALS) tablet, Take 1 tablet by mouth daily., Disp: , Rfl:   .  pravastatin (PRAVACHOL) 40 MG tablet, Take 40 mg by mouth daily.  , Disp: , Rfl:     Allergies:  Allergies   Allergen Reactions   . Codeine Nausea And Vomiting and Other (See Comments)     LIGHTHEADEDNESS.   . Darvon [Propoxyphene] Nausea And Vomiting and Other (See Comments)     LIGHTHEADEDNESS.   Marland Kitchen Phenobarbital Swelling and Rash       Past Medical History:  Past Medical History:   Diagnosis Date   . Abnormal vision    . Arthritis    . Hip fx, right, closed, initial encounter 2010   . Hyperlipidemia    . Hypertension    . Low back pain    . Macular degeneration    . Shingles  Past Surgical History:  Past Surgical History:   Procedure Laterality Date   . APPENDECTOMY     . EYE SURGERY      cateracts   . HIP SURGERY Bilateral     Tubes tied   . TONSILLECTOMY     . TUBAL LIGATION         Family History:  No family history on file.    Social History:  Social History     Social History   . Marital status: Widowed     Spouse name: N/A   . Number of children: N/A   . Years of education: N/A     Occupational History   . Not on file.     Social History Main Topics   . Smoking status: Never Smoker   . Smokeless tobacco: Never Used   . Alcohol use No   . Drug use: No   . Sexual activity: Not Currently     Birth control/ protection: Post-menopausal     Other Topics Concern   . Not on file     Social History Narrative   . No narrative on file         Review Of Systems:    Constitutional:  Fatigue  Neurological:  Negative  Musculoskeletal:  Negative  HEENT:  Vision changes  Cardiovascular:  Shortness of breath  Respiratory: Shortness of breath  Gastrointestinal:  Negative  Genitourinary:  Frequent urination  Skin: Negative  Psychiatric: Negative  Female: Negative    Physical  Examination:    Vitals:    02/22/17 0932   BP: 149/76   Pulse: (!) 115   SpO2: 96%     Constitutional: The patient is oriented to person, place and time with normal mood and affect. Ambulation mildly antalgic with rolling walker for community and household ambulation  Skin: unremarkable  Lymph nodes: Inspection for gross deformities is negative  Girth: Upper and lower limbs are symmetric  Pulses: Peripheral pulses are intact distally in the bilateral upper and lower limbs  Palpation: There is no pain on palpation of the cervical, thoracic, lumbosacral spine  Joints:  Functional range of motion in the bilateral upper and lower limbs, neck, and trunk with no evidence of instability or effusion. Restricted left hip PROM in internal rotation. No pain with left hip compression/circumduction, log roll or PROM.   Spine: Sustained Hip Flexion is positive, SLR on the left is negative, negative Braggard's sign, reverse SLR on the left reproduces left anterior thigh pain, Lumbar range of motion mildly restricted,   Neurologic:  Manual muscle testing 5/5 in the bilateral upper and lower limbs.  Muscle stretch reflexes 2+ in the bilateral upper and lower limbs.  Sensation intact to light touch in the bilateral upper and lower limbs.  Upper motor neuron signs negative in the bilateral upper and lower limbs.  Toes are down-going bilaterally, no clonus at the ankle bilaterally, Hoffman's sign negative bilaterally.      Lab Review:  No results found for this or any previous visit (from the past 240 hour(s)).      Radiology Review:  Left hip xrays dated: 12/02/16  Mild acetabular sclerosis. Well preserved joint space.     Magnetic resonance imaging of the lumbosacral spine dated:  07/13/16  L5S1 mod  desiccation, 25-50% loss of disc ht, grade 1 retrolisthesis, small broad based with left eccentric disc protrusion, mild left lateral recess stenosis, mild left and severe right L5 foraminal stenosis,   L45 mod desiccation,  25-50 % loss of  disc ht, type I modic endplate changes, grade 1 anterolisthesis, broad based protrusion, bilateral FJA with LF buckling, mod CCS, mild/mod bilateral L4 foraminal stenosis   L34 mod desiccation, 25-50 % loss of disc ht, broad based protrusion, bilateral FJA with LF buckling, mild/mod CCS, mod bilateral L3 foraminal stenosis   L23 mod desiccation, < 25 % loss of disc ht    Flexion/extension lumbosacral xrays dated: 10/07/16  Loss of lumbar lordosis  Poor imaging quality limits interpretation  L5S1 50-75% loss of disc ht, grade 1 anterolisthesis   L45  50-75% loss of disc ht  L34 50-75% loss of disc ht  L23 25-50% loss of disc ht  No instability.           ODI: 48        Impression:  1. Axial lower back pain c/w IDD at L5S1 vs L45 vs L34 vs L5S1 vs L45 vs L34 painful facet joint arthrosis.   2. Bilateral, L>R, lower limb pain c/w somatic referral from #1 vs L5 vs S1 vs less likely, L4 radicular pain due to LSS.  3. Left groin pain c/w IA hip joint pathology vs less likely, L4 vs L3 vs L5 radicular pain due to LSS.   4. h/o osteoporosis - on Vit D supplementation.        Plan:  1. We will move forward with diagnostic left hip IA injection and if positive, 2-4 therapeutic left hip injections. If negative, we will move forward with diagnostic left L4 selective nerve root block and if positive, 2-4 therapeutic selective nerve root blocks in a similar fashion. If negative, diagnostic left L3 followed by diagnostic left L5 selective nerve root blocks and if positive, 2-4 therapeutic selective nerve root blocks in a similar fashion.      2. Risks and benefits of the procedures explained to the patient as well as the use of off label corticosteroid in the epidural space.  3. Continue home exercise program. Continue PRN APAP for PO analgesia.          Landry Mellow, DO    [facility]        Thank you for allowing me to be involved in the care of this patient.  If you have any questions please contact me at  [facility] at [phone]    CC: Referring Provider, Flossie Dibble, MD

## 2017-02-22 NOTE — Progress Notes (Signed)
Follow-up visit      Patient Name:                                                 Kelli, GREIF Brown  DOB  1919-06-20  MRN  [id number]    Referring Physician: Dr. Tylene Fantasia      History of Present Illness:    As you know, Kelli Brown  is a pleasant 83 y.o., female with the chief complaint of axial low back pain located in the midline and bilateral, right greater than left, paramidline lumbosacral region and less severe, bilateral, left greater than right, lateral thigh.  The pain is a 8 out of 10 in severity, and aching, dull in quality.  Onset gradual, 15 years duration, worsening over the past  21-months.  Exacerbating factors include standing and walking.  Alleviating factors include sitting . Associated symptoms are denied by the patient.     Previously with 80% improvement in her index lower back pain s/p bilateral S1 TFESI (#2 - 12/09/16). She reports more gradual return to preinjection baseline since then. More recently with more severe left groin and anterior/medial thigh pain s/p diagnostic left IA hip injection (12/24/16 - 100% improvement index groin and thigh pain). Now s/p therapeutic left IA injection (#2 - 01/27/17) with reports 0% sustained benefit.     She reports equally severe index lower back and bilateral, right greater than left, anterior/medial thigh pain. She would like to have her back pain addressed.     Prior Treatments: Physical therapy, epidural steroid injections    Prior Medications: Gabapentin, PRN Tylenol, PRN Hydrocodone    Current Medications:    Current Outpatient Prescriptions:   .  aspirin EC 81 MG EC tablet, Take 81 mg by mouth daily., Disp: , Rfl:   .  calcium carbonate 1500 (600 Ca) MG Tab tablet, Take 600 mg by mouth daily.  , Disp: , Rfl:   .  Cholecalciferol (VITAMIN D) 1000 UNIT tablet, Take 1,000 Units by mouth daily., Disp: , Rfl:   .  dorzolamide (TRUSOPT) 2 % ophthalmic solution, Place 1 drop into the left eye 3 (three) times daily.  , Disp: , Rfl:   .   furosemide (LASIX) 40 MG tablet, Take 40 mg by mouth daily.Take daily Mon-Friday  , Disp: , Rfl:   .  HYDROcodone-acetaminophen (NORCO) 5-325 MG per tablet, Take 1 tablet by mouth every 6 (six) hours as needed for Pain., Disp: 15 tablet, Rfl: 0  .  KLOR-CON M 20 MEQ tablet, Take 20 mEq by mouth daily.  , Disp: , Rfl:   .  metoprolol XL (TOPROL-XL) 50 MG 24 hr tablet, Take 50 mg by mouth daily.  , Disp: , Rfl:   .  Multiple Vitamins-Minerals (MULTIVITAMIN WITH MINERALS) tablet, Take 1 tablet by mouth daily., Disp: , Rfl:   .  pravastatin (PRAVACHOL) 40 MG tablet, Take 40 mg by mouth daily.  , Disp: , Rfl:     Allergies:  Allergies   Allergen Reactions   . Codeine Nausea And Vomiting and Other (See Comments)     LIGHTHEADEDNESS.   . Darvon [Propoxyphene] Nausea And Vomiting and Other (See Comments)     LIGHTHEADEDNESS.   Marland Kitchen Phenobarbital Swelling and Rash       Past Medical History:  Past Medical History:   Diagnosis Date   .  Abnormal vision    . Arthritis    . Hip fx, right, closed, initial encounter 2010   . Hyperlipidemia    . Hypertension    . Low back pain    . Macular degeneration    . Shingles        Past Surgical History:  Past Surgical History:   Procedure Laterality Date   . APPENDECTOMY     . EYE SURGERY      cateracts   . HIP SURGERY Bilateral     Tubes tied   . TONSILLECTOMY     . TUBAL LIGATION         Family History:  No family history on file.    Social History:  Social History     Social History   . Marital status: Widowed     Spouse name: N/A   . Number of children: N/A   . Years of education: N/A     Occupational History   . Not on file.     Social History Main Topics   . Smoking status: Never Smoker   . Smokeless tobacco: Never Used   . Alcohol use No   . Drug use: No   . Sexual activity: Not Currently     Birth control/ protection: Post-menopausal     Other Topics Concern   . Not on file     Social History Narrative   . No narrative on file         Review Of Systems:    Constitutional:   Fatigue  Neurological:  Negative  Musculoskeletal:  Negative  HEENT:  Vision changes  Cardiovascular:  Shortness of breath  Respiratory: Shortness of breath  Gastrointestinal:  Negative  Genitourinary:  Frequent urination  Skin: Negative  Psychiatric: Negative  Female: Negative    Physical Examination:    Vitals:    02/22/17 0932   BP: 149/76   Pulse: (!) 115   SpO2: 96%     Constitutional: The patient is oriented to person, place and time with normal mood and affect. Ambulation mildly antalgic with rolling walker for community and household ambulation  Skin: unremarkable  Lymph nodes: Inspection for gross deformities is negative  Girth: Upper and lower limbs are symmetric  Pulses: Peripheral pulses are intact distally in the bilateral upper and lower limbs  Palpation: There is no pain on palpation of the cervical, thoracic, lumbosacral spine. No pain with lumbosacral spine percussion.   Joints:  Functional range of motion in the bilateral upper and lower limbs, neck, and trunk with no evidence of instability or effusion. Restricted left hip PROM in internal rotation. No pain with left hip compression/circumduction, log roll or PROM.   Spine: Sustained Hip Flexion is positive, SLR bilaterally is negative, negative Braggard's sign, reverse SLR on the left reproduces left anterior thigh pain, Lumbar range of motion mildly restricted,   Neurologic:  Manual muscle testing 5/5 in the bilateral upper and lower limbs.  Muscle stretch reflexes 2+ in the bilateral upper and lower limbs.  Sensation intact to light touch in the bilateral upper and lower limbs.  Upper motor neuron signs negative in the bilateral upper and lower limbs.  Toes are down-going bilaterally, no clonus at the ankle bilaterally, Hoffman's sign negative bilaterally.      Lab Review:  No results found for this or any previous visit (from the past 240 hour(s)).      Radiology Review:  CT abdomen/pelvis dated: 02/15/17  Stable chronic L4 compression  fracture. Acute L3 compression fracture.     Left hip xrays dated: 12/02/16  Mild acetabular sclerosis. Well preserved joint space.     Magnetic resonance imaging of the lumbosacral spine dated:  07/13/16  L5S1 mod  desiccation, 25-50% loss of disc ht, grade 1 retrolisthesis, small broad based with left eccentric disc protrusion, mild left lateral recess stenosis, mild left and severe right L5 foraminal stenosis,   L45 mod desiccation, 25-50 % loss of disc ht, type I modic endplate changes, grade 1 anterolisthesis, broad based protrusion, bilateral FJA with LF buckling, mod CCS, mild/mod bilateral L4 foraminal stenosis   L34 mod desiccation, 25-50 % loss of disc ht, broad based protrusion, bilateral FJA with LF buckling, mild/mod CCS, mod bilateral L3 foraminal stenosis   L23 mod desiccation, < 25 % loss of disc ht    Flexion/extension lumbosacral xrays dated: 10/07/16  Loss of lumbar lordosis  Poor imaging quality limits interpretation  L5S1 50-75% loss of disc ht, grade 1 anterolisthesis   L45  50-75% loss of disc ht  L34 50-75% loss of disc ht  L23 25-50% loss of disc ht  No instability.         ODI: 48        Impression:  1. Axial lower back pain c/w IDD at L5S1 vs L45 vs L34 vs L5S1 vs L45 vs L34 painful facet joint arthrosis.   2. Bilateral, R>L, lower limb pain c/w somatic referral from #1 vs L4 vs L5 vs L3 radicular pain due to LSS.  3. Left groin pain c/w IA hip joint pathology vs less likely, L4 vs L3 vs L5 radicular pain due to LSS.   4. Asymptomatic acute L3 and chronic L4 compression fractures due to osteoporosis - on Vit D supplementation.        Plan:  1. We will move forward with bilateral S1 vs multilevel transforaminal epidural steroid injection (#3).  2. If persistent and functionally limiting index bilateral lower limb pain, we will then move forward with diagnostic right L4 selective nerve root block and if positive, 2-4 therapeutic selective nerve root blocks in a similar fashion. If negative,  diagnostic right L3 followed by diagnostic right L5 selective nerve root blocks and if positive, 2-4 therapeutic selective nerve root blocks in a similar fashion.      3. Continue home exercise program. Continue PRN APAP for PO analgesia.        Landry Mellow, DO    [facility]        Thank you for allowing me to be involved in the care of this patient.  If you have any questions please contact me at [facility] at [phone]    CC: Referring Provider, Flossie Dibble, MD

## 2017-03-03 ENCOUNTER — Ambulatory Visit (INDEPENDENT_AMBULATORY_CARE_PROVIDER_SITE_OTHER): Payer: Medicare Other | Admitting: Physical Medicine & Rehabilitation

## 2017-03-16 ENCOUNTER — Encounter: Payer: Self-pay | Admitting: Family Medicine

## 2017-03-16 ENCOUNTER — Inpatient Hospital Stay: Admission: RE | Admit: 2017-03-16 | Payer: Medicare Other | Source: Ambulatory Visit

## 2017-03-16 ENCOUNTER — Ambulatory Visit
Admission: RE | Admit: 2017-03-16 | Discharge: 2017-03-16 | Disposition: A | Discharge status: Home / Self Care | Payer: Medicare Other | Source: Ambulatory Visit | Attending: Family Medicine | Admitting: Family Medicine

## 2017-03-16 DIAGNOSIS — M25552 Pain in left hip: Secondary | ICD-10-CM | POA: Insufficient documentation

## 2017-03-16 DIAGNOSIS — G8929 Other chronic pain: Secondary | ICD-10-CM

## 2017-03-16 DIAGNOSIS — M545 Low back pain: Secondary | ICD-10-CM | POA: Insufficient documentation

## 2017-03-16 DIAGNOSIS — M419 Scoliosis, unspecified: Secondary | ICD-10-CM | POA: Insufficient documentation

## 2017-03-18 ENCOUNTER — Other Ambulatory Visit (INDEPENDENT_AMBULATORY_CARE_PROVIDER_SITE_OTHER): Payer: Self-pay | Admitting: Cardiovascular Disease

## 2017-03-18 MED ORDER — PRAVASTATIN 40 MG TABLET
40.0000 mg | ORAL_TABLET | Freq: Every day | ORAL | 1 refills | Status: AC
Start: 2017-03-18 — End: ?

## 2017-03-19 ENCOUNTER — Emergency Department: Payer: Medicare Other

## 2017-03-19 ENCOUNTER — Emergency Department
Admission: EM | Admit: 2017-03-19 | Discharge: 2017-03-19 | Disposition: A | Discharge status: Home / Self Care | Payer: Medicare Other | Attending: Emergency Medicine | Admitting: Emergency Medicine

## 2017-03-19 DIAGNOSIS — M5442 Lumbago with sciatica, left side: Secondary | ICD-10-CM | POA: Insufficient documentation

## 2017-03-19 MED ORDER — HYDROMORPHONE HCL 1 MG/ML IJ SOLN
1.0000 mg | Freq: Once | INTRAMUSCULAR | Status: AC
Start: 2017-03-19 — End: 2017-03-19
  Administered 2017-03-19: 13:00:00 1 mg via INTRAMUSCULAR

## 2017-03-19 MED ORDER — ONDANSETRON 4 MG PO TBDP
ORAL_TABLET | ORAL | Status: AC
Start: 2017-03-19 — End: ?
  Filled 2017-03-19: qty 1

## 2017-03-19 MED ORDER — HYDROMORPHONE HCL 0.5 MG/0.5 ML IJ SOLN
INTRAMUSCULAR | Status: AC
Start: 2017-03-19 — End: ?
  Filled 2017-03-19: qty 1

## 2017-03-19 MED ORDER — OXYCODONE-ACETAMINOPHEN 5-325 MG PO TABS
1.0000 | ORAL_TABLET | Freq: Two times a day (BID) | ORAL | 0 refills | Status: AC
Start: 2017-03-19 — End: 2017-03-27

## 2017-03-19 NOTE — Discharge Instructions (Signed)
Exercises to Strengthen Your Lower Back  Strong lower back and abdominal muscles work together to support your spine. The exercises below will help strengthen the lower back. It is important that you begin exercising slowly and increase levels gradually.  Always begin any exercise program with stretching. If you feel pain while doing any of these exercises, stop and talk to your doctor about a more specific exercise program that better suits your condition.  Low back stretch  The point of stretching is to makeyou more flexible and increase your range of motion. Stretch only as much as you are able. Stretch slowly. Do not push your stretch to the limit. If at any point you feel pain while stretching, this is your (temporary) limit.   Lie on your back with your knees bent and both feet on the ground.   Slowly raise your left knee to your chest as you flatten your lower back against the floor. Hold for 5 seconds.   Relax and repeat the exercise with your right knee.   Do 10 of these exercises for each leg.   Repeat hugging both knees to your chest at the same time.  Building lower back strength  Start your exercise routine with 10 to 30 minutes a day, 1 to 3 times a day.  Initial exercises  Lying on your back:  1. Ankle pumps: Move your foot up and down, towards your head, and then away. Repeat 10 times with each foot.  2. Heel slides: Slowly bend your knee, drawing the heel of your foot towards you. Then slide your heel/footfrom you, straightening your knee. Do not lift your foot off the floor (this is not a leg lift).  3. Abdominal contraction: Bend your knees and put your hands on your stomach. Tighten your stomach muscles. Hold for 5 seconds, then relax. Repeat 10 times.  4. Straight leg raise: Bend one leg at the knee and keep the other leg straight. Tighten your stomach muscles. Slowly lift your straight leg 6 to 12 inches off the floor and hold for up to 5 seconds. Repeat 10 times on each  side.  Standing:  1. Wall squats: Stand with your back against the wall. Move your feet about 12 inches away from the wall. Tighten your stomach muscles, and slowly bend your knees until they are at about a 45 degree angle. Do not go down too far. Hold about 5 seconds. Then slowly return to your starting position. Repeat 10 times.  2. Heel raises: Stand facing the wall. Slowly raise the heels of your feet up and down, while keeping your toes on the floor. If you have trouble balancing, you can touch the wall with your hands. Repeat 10 times.  More advanced exercises  When you feel comfortable enough, try these exercises.  1. Kneeling lumbar extension: Begin on your hands and knees. At the same time, raise and straighten your right arm and left leg until they are parallel to the ground. Hold for 2 seconds and come back slowly to a starting position. Repeat with left arm and right leg, alternating 10 times.  2. Prone lumbar extension: Lie face down, arms extended overhead, palms on the floor. At the same time, raise your right arm and left leg as high as comfortably possible. Hold for 10 seconds and slowly return to start. Repeat with left arm and right leg, alternating 10 times. Gradually build up to 20 times. (Advanced: Repeat this exercise raising both arms and both legs a   few inches off the floor at the same time. Hold for 5 seconds and release.)  3. Pelvic tilt: Lie on the floor on your back with your knees bent at 90 degrees. Your feet should be flat on the floor. Inhale, exhale, then slowly contract your abdominal muscles bringing your navel toward your spine. Let your pelvis rock back until your lower back is flat on the floor. Hold for 10 seconds while breathing smoothly.  4. Abdominal crunch: Perform a pelvic tilt (above) flattening your lower back against the floor. Holding the tension in your abdominal muscles, take another breath and raise your shoulder blades off the ground (this is not a full sit-up).  Keep your head in line with your body (don't bend your neck forward). Hold for 2 seconds, then slowly lower.  Date Last Reviewed: 04/24/2015   2000-2016 [employer] [address], [place] [address]. All rights reserved. This information is not intended as a substitute for professional medical care. Always follow your healthcare professional's instructions.

## 2017-03-19 NOTE — ED Notes (Signed)
Bed: N6-A  Expected date:   Expected time:   Means of arrival:   Comments:  EMS

## 2017-03-19 NOTE — ED Provider Notes (Signed)
Physician/Midlevel provider first contact with patient: 03/19/17 1215              EMERGENCY DEPARTMENT  PHYSICIAN NOTE    Patient Name: Kelli Brown  Encounter Date:  03/19/2017  PCP: Flossie Dibble, MD  Patient DOB:  1918-12-01  MRN:  [id number]  Room:  N6/N6-A  ED Physician: Harless Nakayama. Sherryll Burger, MD    DIAGNOSIS / DISPOSITION     Clinical Impression  1. Left-sided low back pain with left-sided sciatica, unspecified chronicity        Disposition  ED Disposition     ED Disposition Condition Date/Time Comment    Discharge  Fri Mar 19, 2017  3:13 PM Kelli Brown discharge to home/self care.    Condition at disposition: Stable         Prescriptions  Discharge Medication List as of 03/19/2017  3:13 PM      START taking these medications    Details   oxyCODONE-acetaminophen (PERCOCET) 5-325 MG per tablet Take 1 tablet by mouth 2 (two) times daily.for 15 doses, Starting Fri 03/19/2017, Until Sat 03/27/2017, Print             HISTORY OF PRESENTING ILLNESS     Chief complaint: Hip Pain    HPI/ROS is limited by: none  HPI/ROS given by: patient      Kelli Brown is a 83 y.o. female who presents with left hip pain that started 1 year ago. The pt sees a care provider for pain management. Stated that she gets shots of pain medication but they do not help. Her doctor has prescribed her hydrocodone but it does not help. The pain is sharp and gets worse with movement. The pain is constantly present. Denies any fall or injury. Stated that she had xray done 3 days ago. Denies having a fever. The pt has chronic edema of the legs. Does not know of any relieving factors. The patient lives in an assisted living facility. She states she had x-rays a few days ago but does not know the results.    The pt has no other signs or symptoms. Denies having any other medical problems.     REVIEW OF SYSTEMS   Review of Systems   Constitutional: Negative.  Negative for chills, fever and malaise/fatigue.   HENT: Negative.  Negative for  congestion, ear pain and sore throat.    Respiratory: Negative.  Negative for cough, sputum production and shortness of breath.    Cardiovascular: Negative.  Negative for chest pain, palpitations and leg swelling.   Gastrointestinal: Negative.  Negative for abdominal pain, diarrhea, nausea and vomiting.   Genitourinary: Negative.  Negative for dysuria, flank pain and frequency.   Musculoskeletal: Positive for joint pain (left hip pain). Negative for back pain and neck pain.   Skin: Negative for rash.   Neurological: Negative.  Negative for dizziness, tingling, tremors, weakness and headaches.     PHYSICAL EXAM   Blood pressure 122/64, pulse 87, temperature 98 F (36.7 C), temperature source Oral, resp. rate 16, SpO2 100 %.  The vital signs and the nurses note have been reviewed by me  Physical Exam   Constitutional: She is oriented to person, place, and time. She appears well-developed and well-nourished. No distress.   HENT:   Head: Normocephalic and atraumatic.   Eyes: EOM are normal. Right eye exhibits no discharge. Left eye exhibits no discharge.   Neck: Normal range of motion. No tracheal deviation present.   Pulmonary/Chest: Effort  normal. No respiratory distress.   Musculoskeletal: Normal range of motion. She exhibits tenderness (left hip). She exhibits no edema or deformity.   No pain with range of motion of the knee or the ankle. 2+ pulses distally. Neurovascularly intact.   Neurological: She is alert and oriented to person, place, and time.   Skin: Skin is warm and dry. She is not diaphoretic. No erythema.   Psychiatric: She has a normal mood and affect. Thought content normal.   Nursing note and vitals reviewed.    ALLERGIES      Codeine; Darvon [propoxyphene]; and Phenobarbital    The patient's allergies were reviewed by the M.D.    MEDICATIONS     No current facility-administered medications for this encounter.     Current Outpatient Prescriptions:   .  aspirin EC 81 MG EC tablet, Take 81 mg by mouth  daily., Disp: , Rfl:   .  calcium carbonate 1500 (600 Ca) MG Tab tablet, Take 600 mg by mouth daily.  , Disp: , Rfl:   .  Cholecalciferol (VITAMIN D) 1000 UNIT tablet, Take 1,000 Units by mouth daily., Disp: , Rfl:   .  dorzolamide (TRUSOPT) 2 % ophthalmic solution, Place 1 drop into the left eye 3 (three) times daily.  , Disp: , Rfl:   .  furosemide (LASIX) 40 MG tablet, Take 40 mg by mouth daily.Take daily Mon-Friday  , Disp: , Rfl:   .  KLOR-CON M 20 MEQ tablet, Take 20 mEq by mouth daily.  , Disp: , Rfl:   .  metoprolol XL (TOPROL-XL) 50 MG 24 hr tablet, Take 50 mg by mouth daily.  , Disp: , Rfl:   .  Multiple Vitamins-Minerals (MULTIVITAMIN WITH MINERALS) tablet, Take 1 tablet by mouth daily., Disp: , Rfl:   .  oxyCODONE-acetaminophen (PERCOCET) 5-325 MG per tablet, Take 1 tablet by mouth 2 (two) times daily.for 15 doses, Disp: 20 tablet, Rfl: 0  .  pravastatin (PRAVACHOL) 40 MG tablet, Take 40 mg by mouth daily.  , Disp: , Rfl:      The patient's medications were reviewed by the M.D.  PAST MEDICAL HISTORY     Past Medical History:   Diagnosis Date   . Abnormal vision    . Arthritis    . Hip fx, right, closed, initial encounter 2010   . Hyperlipidemia    . Hypertension    . Low back pain    . Macular degeneration    . Shingles        The patient's past medical history was reviewed by the M.D.  PAST SURGICAL HISTORY     Past Surgical History:   Procedure Laterality Date   . APPENDECTOMY     . EYE SURGERY      cateracts   . HIP SURGERY Bilateral     Tubes tied   . TONSILLECTOMY     . TUBAL LIGATION         The patient's past surgical history was reviewed by the M.D.    FAMILY HISTORY     History reviewed. No pertinent family history.    SOCIAL HISTORY     Social History   Substance Use Topics   . Smoking status: Never Smoker   . Smokeless tobacco: Never Used   . Alcohol use No       ORDERS PLACED AND MEDICATIONS GIVEN     Orders Placed This Encounter   Procedures   . US Venous Leg Left  Medications      HYDROmorphone (DILAUDID) injection 1 mg (1 mg Intramuscular Given 03/19/17 1314)       DIAGNOSTIC RESULTS       The results of the diagnostic studies below have been reviewed by myself:    Labs  Results     ** No results found for the last 24 hours. **            Radiologic Studies  I have personally reviewed the images myself  Xr Lumbar Spine 4+ Views    Result Date: 03/16/2017  Moderate scoliosis with moderate degenerative changes. No fracture. ReadingStation:[id number]    Xr Hip Left 2-3 Vw Without Pelvis    Result Date: 03/16/2017  No fracture or degenerative change of the left hip. ReadingStation:[id number]    US Venous Leg Left    Result Date: 03/19/2017  No evidence of deep vein thrombosis left leg. ReadingStation:[id number]        MDM / ED COURSE     Blood pressure 122/64, pulse 87, temperature 98 F (36.7 C), temperature source Oral, resp. rate 16, SpO2 100 %.    I have personally reviewed the patient's past medical records.          This patient presented to the Emergency Department with pelvic pain.   Based on the patients presentation and evaluation there are no signs of life-threatening, surgical or serious etiology. The differential diagnosis includes but is not limited to PID, cervicitis, dysfunctional uterine bleeding, ovarian cyst and appendicitis.  The patient was given precautions and instructions to return if symptoms worsen or change in any way, or in 8-12 hours if not improved for re-evaluation.  Follow-up with the patient's primary care physician or a suggested specialist was encouraged.  The patient was well-appearing at time of disposition and given pelvic pain precautions.  Diagnostic impression and plan were discussed with the patient and/or family.  Results of lab/radiology tests were discussed with the patient and/or family. All questions were answered and concerns addressed.    Pain improved with analgesics. The patient was able to ambulate using a walker in the emergency room. She does  have a walker at home which she uses. She'll follow-up with her primary care physician. I will start her on analgesics. She'll return for worsening of symptoms.    PROCEDURES         EKG     The following EKG was obtained and independently interpreted by me.It shows:              Note:  This chart was generated by the Epic EMR system/ speech recognition and may contain inherent errors, including typographical, or omissions not intended by the user           Ermalene Postin, MD  03/20/17 [phone]

## 2017-03-19 NOTE — ED Notes (Signed)
Pt co L hip pain started appx one year ago with no injury.  Pt states her pain had increased today; pain medication was given at nursing facility with no relief.  Pt has +4 swelling to LLE with weak pedal pulse.  Pt states pain increases with weight bearing activity.

## 2017-03-22 ENCOUNTER — Other Ambulatory Visit (INDEPENDENT_AMBULATORY_CARE_PROVIDER_SITE_OTHER): Payer: Self-pay | Admitting: Nurse Practitioner

## 2017-03-22 DIAGNOSIS — M5136 Other intervertebral disc degeneration, lumbar region: Secondary | ICD-10-CM

## 2017-03-24 ENCOUNTER — Encounter (INDEPENDENT_AMBULATORY_CARE_PROVIDER_SITE_OTHER): Payer: Medicare Other | Admitting: CARDIOVASCULAR DISEASE

## 2017-03-24 ENCOUNTER — Encounter (INDEPENDENT_AMBULATORY_CARE_PROVIDER_SITE_OTHER): Payer: Medicare Other | Admitting: INTERNAL MEDICINE - CARDIOVASCULAR DISEASE

## 2017-03-31 ENCOUNTER — Ambulatory Visit (INDEPENDENT_AMBULATORY_CARE_PROVIDER_SITE_OTHER): Payer: Medicare Other | Admitting: Physical Medicine & Rehabilitation

## 2017-03-31 ENCOUNTER — Encounter (INDEPENDENT_AMBULATORY_CARE_PROVIDER_SITE_OTHER): Payer: Self-pay

## 2017-03-31 ENCOUNTER — Encounter (INDEPENDENT_AMBULATORY_CARE_PROVIDER_SITE_OTHER): Payer: Medicare Other | Admitting: Cardiovascular Disease

## 2017-04-01 ENCOUNTER — Ambulatory Visit (INDEPENDENT_AMBULATORY_CARE_PROVIDER_SITE_OTHER): Payer: Medicare Other | Admitting: Cardiovascular Disease

## 2017-04-01 VITALS — BP 125/66 | HR 99 | Ht 60.0 in | Wt 150.0 lb

## 2017-04-01 DIAGNOSIS — I350 Nonrheumatic aortic (valve) stenosis: Secondary | ICD-10-CM

## 2017-04-01 NOTE — Progress Notes (Signed)
[facility] [place]  [address]  [address]  [place] [address]  [phone]    Date: 04/01/2017  Patient Name: Makayla Allen  MRN#: [id number]  DOB: 1919-07-25    Provider: Renata Caprice, MD  PCP: Tylene Fantasia, MD      Reason for visit: Follow Up 6 Months and Pulmonary Hypertension    History:     Vallorie Niccoli is a 83 y.o. female with a history pulmonary HTN. She was last seen by Dr. Barbara Cower on 09/23/16 when she had reported back pain; no changes were made. Today she reports continued back pain; she uses a walker. She does not have any cardiac complaints but has LE edema. This is not new for her. No c/o any CP, SOB, palpitations, syncope/presyncope, orthopnea/PND, or claudication.    Past Medical History:   No past medical history on file.      Past Surgical History:     Past Surgical History:   Procedure Laterality Date    CATARACT EXTRACTION BILATERAL W/ ANTERIOR VITRECTOMY      HIP ARTHROPLASTY      HX APPENDECTOMY      HX TONSILLECTOMY      HX TUBAL LIGATION           Allergies:     Allergies   Allergen Reactions    Codeine     Phenobarbital        Medications:     Current Outpatient Prescriptions   Medication Sig    aspirin 81 mg Oral Tablet, Chewable Take 81 mg by mouth Once a day    cholecalciferol, vitamin D3, 1,000 unit Oral Tablet Take 1,000 Units by mouth Once a day    dorzolamide (TRUSOPT) 2 % Ophthalmic Drops Instill 1 Drop into both eyes Once a day    furosemide (LASIX) 40 mg Oral Tablet Take 40 mg by mouth Every other day    metoprolol succinate (TOPROL-XL) 50 mg Oral Tablet Sustained Release 24 hr Take 50 mg by mouth Once a day    multivitamin Oral Tablet Take 1 Tab by mouth Once a day    potassium chloride (K-DUR) 20 mEq Oral Tab Sust.Rel. Particle/Crystal Take 20 mEq by mouth Once a day    pravastatin (PRAVACHOL) 40 mg Oral Tablet Take 1 Tab (40 mg total) by mouth Once a day    vit A/vit C/vit E/zinc/copper (PRESERVISION AREDS ORAL) Take by  mouth Once a day       Family History:     Family Medical History     None            Social History:     Social History     Social History    Marital status: Widowed     Spouse name: N/A    Number of children: N/A    Years of education: N/A     Social History Main Topics    Smoking status: Never Smoker    Smokeless tobacco: Never Used    Alcohol use No    Drug use: Not on file    Sexual activity: Not on file     Other Topics Concern    Not on file     Social History Narrative    No narrative on file       Review of Systems:     All systems were reviewed and are negative other than noted in the HPI.    Physical Exam:  Vitals:    04/01/17 1000   BP: 125/66   Pulse: 99   SpO2: 92%   Weight: 68 kg (150 lb)   Height: 1.524 m (5')     Body mass index is 29.29 kg/(m^2).    HEENT:  normocephalic, atraumatic.    Neck: Neck exam reveals no masses.  No thyromegaly.  Jugular veins examination reveals no distention.  Carotid arteries exam, radiating aortic valve murmur  Pulmonary: Assessment of respiratory effort reveals normal respiratory effort.  Auscultation of lungs reveal clear lung fields.     Cardiac: Normal S1 and S2, ESM, no rubs, or gallops.         Abdomen: Abdomen soft, nontender, no bruits.      Extremities:  1-2+ edema present bilateral lower extremities.  Pulse exam 2+  Neurological/Psychological: Oriented to person, place and time. Mood and affect appear appropriate for age.       Cardiovascular Workup:     Echo 11/02/13  EF 60-65%. Grade I DD.  Mild AS with trace AI.  Mild Mitrial stenosis with trace MI.  Mild TR.  RVSP 44 mmHg    AAA screening 02/21/14  Negative for AAA    Carotid duplex 02/21/14   RICA & LICA <50% stenosis    ABI 05/10/14  No evidence of arterial insufficiency b/l by resting and exercise ABI  Toe pressure consistent with healing b/l    Nuclear stress 09/18/14   EF 65%  Normal tracer uptake  Low risk study    Assessment and Plan:     Freddy Finnerudrey Copus is a 83 y.o. female with    Chest  pain  - no recent chest pain  - continue on ASA, BB and statin    Systolic murmur:   - discussed repeating an echo but pt wants to hold off on this for now  - continue on ASA and statin    HTN: well controlled today  - continue current meds    Pulmonary HTN  - no SOB/DOE    LE edema  - continued edema but not worsening  - continue on Lasix. Sometimes forgets to take this    General: Back pain  - stable, uses a walker        Thank you for allowing me to participate in the care of your patient. Please feel free to contact me if there are further questions.     I am scribing for, and in the presence of Dr. Renata CapriceManish B Phillp Dolores, MD for services provided on 04/01/2017.  Dustan Demello, SCRIBE     Dustan Demello, SCRIBE  04/01/2017, 10:51      I personally performed the services described in this documentation, as scribed in my presence, and it is both accurate and complete.    Renata CapriceManish B Hazen Brumett, MD    Renata CapriceManish B Yancarlos Berthold, MD  04/01/2017, 10:54

## 2017-04-14 ENCOUNTER — Ambulatory Visit (INDEPENDENT_AMBULATORY_CARE_PROVIDER_SITE_OTHER): Payer: Medicare Other | Admitting: Physical Medicine & Rehabilitation

## 2017-05-04 ENCOUNTER — Inpatient Hospital Stay
Admission: EM | Admit: 2017-05-04 | Discharge: 2017-05-05 | DRG: 195 | Disposition: A | Discharge status: Nursing Home (Includes ICF) | Payer: Medicare Other | Source: Skilled Nursing Facility | Attending: Internal Medicine | Admitting: Internal Medicine

## 2017-05-04 ENCOUNTER — Emergency Department: Payer: Medicare Other

## 2017-05-04 DIAGNOSIS — Z9842 Cataract extraction status, left eye: Secondary | ICD-10-CM

## 2017-05-04 DIAGNOSIS — J189 Pneumonia, unspecified organism: Principal | ICD-10-CM | POA: Diagnosis present

## 2017-05-04 DIAGNOSIS — Z9841 Cataract extraction status, right eye: Secondary | ICD-10-CM

## 2017-05-04 DIAGNOSIS — J168 Pneumonia due to other specified infectious organisms: Secondary | ICD-10-CM

## 2017-05-04 DIAGNOSIS — H3530 Unspecified macular degeneration: Secondary | ICD-10-CM | POA: Diagnosis present

## 2017-05-04 DIAGNOSIS — R0902 Hypoxemia: Secondary | ICD-10-CM | POA: Diagnosis present

## 2017-05-04 DIAGNOSIS — Z9049 Acquired absence of other specified parts of digestive tract: Secondary | ICD-10-CM

## 2017-05-04 DIAGNOSIS — E785 Hyperlipidemia, unspecified: Secondary | ICD-10-CM | POA: Diagnosis present

## 2017-05-04 DIAGNOSIS — M1990 Unspecified osteoarthritis, unspecified site: Secondary | ICD-10-CM | POA: Diagnosis present

## 2017-05-04 DIAGNOSIS — I10 Essential (primary) hypertension: Secondary | ICD-10-CM | POA: Diagnosis present

## 2017-05-04 DIAGNOSIS — Z8619 Personal history of other infectious and parasitic diseases: Secondary | ICD-10-CM

## 2017-05-04 DIAGNOSIS — Z885 Allergy status to narcotic agent status: Secondary | ICD-10-CM

## 2017-05-04 DIAGNOSIS — Z7982 Long term (current) use of aspirin: Secondary | ICD-10-CM

## 2017-05-04 DIAGNOSIS — J181 Lobar pneumonia, unspecified organism: Secondary | ICD-10-CM

## 2017-05-04 LAB — BASIC METABOLIC PANEL
Anion Gap: 11.4 mMol/L (ref 7.0–18.0)
BUN / Creatinine Ratio: 15.5 Ratio (ref 10.0–30.0)
BUN: 15 mg/dL (ref 7–22)
CO2: 27.7 mMol/L (ref 20.0–30.0)
Calcium: 9.5 mg/dL (ref 8.5–10.5)
Chloride: 104 mMol/L (ref 98–110)
Creatinine: 0.97 mg/dL (ref 0.60–1.20)
EGFR: 49 mL/min/{1.73_m2} — ABNORMAL LOW (ref 60–150)
Glucose: 139 mg/dL — ABNORMAL HIGH (ref 71–99)
Osmolality Calc: 281 mOsm/kg (ref 275–300)
Potassium: 4.1 mMol/L (ref 3.5–5.3)
Sodium: 139 mMol/L (ref 136–147)

## 2017-05-04 LAB — B-TYPE NATRIURETIC PEPTIDE: B-Natriuretic Peptide: 126.3 pg/mL — ABNORMAL HIGH (ref 0.0–100.0)

## 2017-05-04 LAB — TROPONIN I: Troponin I: 0.01 ng/mL (ref 0.00–0.02)

## 2017-05-04 MED ORDER — VH BIO-K PLUS PROBIOTIC 50 BIL CFU CAPSULE
50.0000 | DELAYED_RELEASE_CAPSULE | Freq: Every day | ORAL | Status: DC
Start: 2017-05-04 — End: 2017-05-05
  Administered 2017-05-04 – 2017-05-05 (×2): 50 via ORAL
  Filled 2017-05-04 (×3): qty 1

## 2017-05-04 MED ORDER — METHYLPREDNISOLONE SODIUM SUCC 40 MG IJ SOLR
INTRAMUSCULAR | Status: AC
Start: 2017-05-04 — End: ?
  Filled 2017-05-04: qty 1

## 2017-05-04 MED ORDER — AZITHROMYCIN 250 MG PO TABS
ORAL_TABLET | ORAL | Status: AC
Start: 2017-05-04 — End: ?
  Filled 2017-05-04: qty 2

## 2017-05-04 MED ORDER — ENOXAPARIN SODIUM 60 MG/0.6ML SC SOLN
40.0000 mg | SUBCUTANEOUS | Status: DC
Start: 2017-05-04 — End: 2017-05-05
  Filled 2017-05-04 (×2): qty 0.6

## 2017-05-04 MED ORDER — ALBUTEROL-IPRATROPIUM 2.5-0.5 (3) MG/3ML IN SOLN
3.0000 mL | Freq: Once | RESPIRATORY_TRACT | Status: AC
Start: 2017-05-04 — End: 2017-05-04
  Administered 2017-05-04: 17:00:00 3 mL via RESPIRATORY_TRACT

## 2017-05-04 MED ORDER — ACETAMINOPHEN 160 MG/5ML PO SOLN
650.0000 mg | ORAL | Status: DC | PRN
Start: 2017-05-04 — End: 2017-05-05

## 2017-05-04 MED ORDER — DORZOLAMIDE HCL 2 % OP SOLN
1.0000 [drp] | Freq: Three times a day (TID) | OPHTHALMIC | Status: DC
Start: 2017-05-04 — End: 2017-05-05
  Administered 2017-05-04: 21:00:00 1 [drp] via OPHTHALMIC
  Filled 2017-05-04 (×2): qty 200

## 2017-05-04 MED ORDER — ACETAMINOPHEN 325 MG PO TABS
650.0000 mg | ORAL_TABLET | ORAL | Status: DC | PRN
Start: 2017-05-04 — End: 2017-05-05

## 2017-05-04 MED ORDER — VH POTASSIUM CHLORIDE CRYS ER 20 MEQ PO TBCR (WRAP)
20.0000 meq | EXTENDED_RELEASE_TABLET | Freq: Every morning | ORAL | Status: DC
Start: 2017-05-05 — End: 2017-05-05
  Administered 2017-05-05: 11:00:00 20 meq via ORAL
  Filled 2017-05-04: qty 1

## 2017-05-04 MED ORDER — AZITHROMYCIN 250 MG PO TABS
500.0000 mg | ORAL_TABLET | Freq: Once | ORAL | Status: AC
Start: 2017-05-04 — End: 2017-05-04
  Administered 2017-05-04: 17:00:00 500 mg via ORAL

## 2017-05-04 MED ORDER — METHYLPREDNISOLONE SODIUM SUCC 125 MG IJ SOLR
40.0000 mg | Freq: Once | INTRAMUSCULAR | Status: AC
Start: 2017-05-04 — End: 2017-05-04
  Administered 2017-05-04: 17:00:00 40 mg via INTRAVENOUS

## 2017-05-04 MED ORDER — CEFTRIAXONE SODIUM 1 G IJ SOLR
INTRAMUSCULAR | Status: AC
Start: 2017-05-04 — End: ?
  Filled 2017-05-04: qty 1000

## 2017-05-04 MED ORDER — ASPIRIN 81 MG PO TBEC
81.0000 mg | DELAYED_RELEASE_TABLET | Freq: Every morning | ORAL | Status: DC
Start: 2017-05-05 — End: 2017-05-05
  Administered 2017-05-05: 11:00:00 81 mg via ORAL
  Filled 2017-05-04: qty 1

## 2017-05-04 MED ORDER — FUROSEMIDE 40 MG PO TABS
40.0000 mg | ORAL_TABLET | ORAL | Status: DC
Start: 2017-05-05 — End: 2017-05-05
  Administered 2017-05-05: 11:00:00 40 mg via ORAL
  Filled 2017-05-04: qty 1

## 2017-05-04 MED ORDER — VH SODIUM CHLORIDE BACTERIOSTATIC 0.9 % IJ SOLN (WRAPPED)
1.0000 g | Freq: Once | INTRAMUSCULAR | Status: AC
Start: 2017-05-04 — End: 2017-05-04
  Administered 2017-05-04: 17:00:00 1 g via INTRAVENOUS

## 2017-05-04 MED ORDER — PRAVASTATIN SODIUM 40 MG PO TABS
40.0000 mg | ORAL_TABLET | Freq: Every evening | ORAL | Status: DC
Start: 2017-05-04 — End: 2017-05-05
  Administered 2017-05-04: 20:00:00 40 mg via ORAL
  Filled 2017-05-04 (×2): qty 1

## 2017-05-04 MED ORDER — ALBUTEROL-IPRATROPIUM 2.5-0.5 (3) MG/3ML IN SOLN
RESPIRATORY_TRACT | Status: AC
Start: 2017-05-04 — End: ?
  Filled 2017-05-04: qty 3

## 2017-05-04 MED ORDER — BENZOCAINE-MENTHOL 15-3.6 MG MT LOZG
1.0000 | LOZENGE | OROMUCOSAL | Status: DC | PRN
Start: 2017-05-04 — End: 2017-05-05
  Filled 2017-05-04: qty 1

## 2017-05-04 MED ORDER — AZITHROMYCIN 250 MG PO TABS
250.0000 mg | ORAL_TABLET | Freq: Every day | ORAL | Status: DC
Start: 2017-05-05 — End: 2017-05-05
  Administered 2017-05-05: 11:00:00 250 mg via ORAL
  Filled 2017-05-04: qty 1

## 2017-05-04 MED ORDER — ACETAMINOPHEN 650 MG RE SUPP
650.0000 mg | RECTAL | Status: DC | PRN
Start: 2017-05-04 — End: 2017-05-05

## 2017-05-04 MED ORDER — VH SODIUM CHLORIDE BACTERIOSTATIC 0.9 % IJ SOLN (WRAPPED)
1.0000 g | INTRAMUSCULAR | Status: DC
Start: 2017-05-05 — End: 2017-05-05

## 2017-05-04 MED ORDER — METOPROLOL SUCCINATE ER 50 MG PO TB24
50.0000 mg | ORAL_TABLET | Freq: Every morning | ORAL | Status: DC
Start: 2017-05-05 — End: 2017-05-05
  Administered 2017-05-05: 11:00:00 50 mg via ORAL
  Filled 2017-05-04: qty 1

## 2017-05-04 MED ORDER — GUAIFENESIN 100 MG/5ML PO SOLN
200.0000 mg | ORAL | Status: DC | PRN
Start: 2017-05-04 — End: 2017-05-05
  Administered 2017-05-04 – 2017-05-05 (×2): 200 mg via ORAL
  Filled 2017-05-04 (×2): qty 10

## 2017-05-04 NOTE — ED Notes (Signed)
Patient has a small black cell phone which she is holding in her hand/lying in the bed next to her. I asked if she wanted to put the phone in her purse so that it does not get lost and she declines, stating she is expecting some long distance phone calls.

## 2017-05-04 NOTE — H&P (Signed)
HISTORY AND PHYSICAL - [facility]    Date Time: 05/04/17 6:23 PM  Patient Name: Kelli Brown,Kelli Brown  Attending Physician: Blanca Friend, MD  Primary Care Physician: Flossie Dibble, MD    CC:   Chief Complaint   Patient presents with   . Shortness of Breath       Assessment and Plan                                                          [facility]       Pneumonia  Admit to medical unit  IV antibiotic per protocol  Blood culture x2, further antibiotics per c & s  Respiratory Rx as needed, CXR in am    HTN (hypertension)  controlled, continue home meds with hold parameters    Hyperlipidemia  Continue with home meds      History of Presenting Illness and ROS:                               [facility]        Kelli Brown is a 83 y.o. female with PMHx of HTN, hyperlipidemia, NH resident who presents to the hospital with cough and SOB.   Patient was in her usual state of health until 3 days ago when she started to have cough and SOB. She advises she has not felt the whole last with a flu-like symptoms. Her PCP made a house visit today and advised her to come to the ER.   Work up in the ER showed R basilar infiltrate for which she received Rocephin and Azithromycin for CAP.   Had fever this morning associated with chills  No CP. NO N,V,D.   No urinary complaint     Past Medical History:                                                            [facility]       Past Medical History:   Diagnosis Date   . Abnormal vision    . Arthritis    . Hip fx, right, closed, initial encounter 2010   . Hyperlipidemia    . Hypertension    . Low back pain    . Macular degeneration    . Shingles        Past Surgical History:                                                            [facility]       Past Surgical History:   Procedure Laterality Date   . APPENDECTOMY     . EYE SURGERY      cateracts   . HIP SURGERY Bilateral     Tubes tied   . TONSILLECTOMY     . TUBAL LIGATION  Family History:                                                                         [facility]       History reviewed. No pertinent family history.    Social History:                                                                         [facility]       History   Smoking Status   . Never Smoker   Smokeless Tobacco   . Never Used     History   Alcohol Use No     History   Drug Use No       Allergies:                                                                                   [facility]       Allergies   Allergen Reactions   . Codeine Nausea And Vomiting and Other (See Comments)     LIGHTHEADEDNESS.   . Darvon [Propoxyphene] Nausea And Vomiting and Other (See Comments)     LIGHTHEADEDNESS.   Marland Kitchen Phenobarbital Swelling and Rash       Medications:                                                                             [facility]       Current/Home Medications    ASPIRIN EC 81 MG EC TABLET    Take 81 mg by mouth every morning.        CALCIUM CARBONATE-VITAMIN D (CALTRATE 600+D PO)    Take 1 tablet by mouth every morning.    CHOLECALCIFEROL (VITAMIN D) 1000 UNIT TABLET    Take 1,000 Units by mouth every morning.        DORZOLAMIDE (TRUSOPT) 2 % OPHTHALMIC SOLUTION    Place 1 drop into the left eye 3 (three) times daily.       FUROSEMIDE (LASIX) 40 MG TABLET    Take 40 mg by mouth daily.Take daily Mon-Friday        KLOR-CON M 20 MEQ TABLET    Take 20 mEq by mouth every morning.        METOPROLOL XL (TOPROL-XL) 50 MG 24 HR TABLET    Take 50 mg  by mouth every morning.        MULTIPLE VITAMINS-MINERALS (CENTRUM SILVER 50+WOMEN PO)    Take 1 tablet by mouth every morning.    MULTIPLE VITAMINS-MINERALS (PRESERVISION AREDS PO)    Take 1 capsule by mouth every morning.    PRAVASTATIN (PRAVACHOL) 40 MG TABLET    Take 40 mg by mouth nightly.            Review Of Systems:                                                               [facility]       All other systems  were reviewed and are negative except: as iN HPI      Physical Exam:                                                                         [facility]       Patient Vitals for the past 24 hrs:   BP Temp Temp src Pulse Resp SpO2 Height Weight   05/04/17 1730 - - - 99 21 100 % - -   05/04/17 1700 - - - 89 19 100 % - -   05/04/17 1630 107/76 - - (!) 107 15 100 % - -   05/04/17 1606 102/56 - - 95 (!) 24 98 % - -   05/04/17 1600 - - - 93 17 98 % - -   05/04/17 1534 106/57 - - 98 21 93 % - -   05/04/17 1527 106/57 99.2 F (37.3 C) Tympanic (!) 102 15 92 % 1.549 m (5\' 1" ) 68.9 kg (151 lb 14.4 oz)     Body mass index is 28.7 kg/m.  No intake or output data in the 24 hours ending 05/04/17 1823    General: awake, alert, oriented x 3; no acute distress.  HEENT: perrla, eomi, sclera anicteric  oropharynx clear without lesions, mucous membranes moist  Glands: No cervical or axillary lymphadenopathy.  Neck: supple, no lymphadenopathy, no thyromegaly, no JVD, no carotid bruits  Cardiovascular: regular rate and rhythm, no rubs or gallops  Lungs: ronchi b/l R> L  Abdomen: soft, non-tender, non-distended; no palpable masses, no hepatosplenomegaly, normoactive bowel sounds, no rebound or guarding  Extremities: no clubbing, cyanosis, or edema  Neuro: cranial nerves grossly intact, strength 5/5 in upper and lower extremities, sensation intact,   Psych: Normal affect, not depressed   Skin: no rashes or lesions noted    Labs and Imaging:                                                                   [facility]       Results     Procedure Component Value Units Date/Time  Troponin I (Stat) [[id number]] Collected:  05/04/17 1535    Specimen:  Plasma Updated:  05/04/17 1737     Troponin I 0.01 ng/mL           Imaging: reviewed     Signed by: Alvino Blood, MD  Pager # [phone]    Total time spent in counseling and/or coordination of care with other physicians, other health care professionals, patient and family is 70  minutes.     [facility]  [address]  [place], [place]    JX:BJYNW, Dalbert Garnet, MD

## 2017-05-04 NOTE — ED Notes (Addendum)
Patient vital signs stable at this time with no acute changes in assessment noted. Patient is A&O x4. Pt updated to room number and plan of care.

## 2017-05-04 NOTE — Plan of Care (Signed)
Problem: Compromised Hemodynamic Status  Goal: Vital signs and fluid balance maintained/improved  Outcome: Progressing   05/04/17 2254   Goal/Interventions addressed this shift   Vital signs and fluid balance are maintained/improved Position patient for maximum circulation/cardiac output;Monitor/assess vitals and hemodynamic parameters with position changes;Monitor and compare daily weight;Monitor intake and output. Notify LIP if urine output is less than 30 mL/hour.;Monitor/assess lab values and report abnormal values       Problem: Infection  Goal: Free from infection  Outcome: Progressing      Problem: Inadequate Tissue Perfusion  Goal: Adequate tissue perfusion will be maintained  Outcome: Progressing   05/04/17 2254   Goal/Interventions addressed this shift   Adequate tissue perfusion will be maintained Monitor/assess vital signs;Monitor/assess lab values and report abnormal values;Reinforce use of ordered respiratory interventions (i.e. CPAP, BiPAP, Incentive Spirometer, Acapella, etc.);Elevate feet;Increase mobility as tolerated/progressive mobility;Monitor/assess for signs of VTE (edema of calf/thigh redness, pain)

## 2017-05-04 NOTE — ACP (Advance Care Planning) (Signed)
Advance Care Planning Services - [facility]     Patient Name: Kelli Brown LOS: 0 days   Attending Physician: Blanca Friend, MD   Primary Care Physician: Flossie Dibble, MD   Narrative of Meeting [facility]   Events prompting this discussion:  Pneumonia   Hospitalization for above reason  Advanced age of 21  HTN  Hyperlipidemia    The types of care discussed:  Standard of care for disease, Resuscitation with CPR and Intubation/TracheostomyTube    Patient/Surrogate wishes and goals for treatment:  Patient wishes to be a full code.   Patient accepts Chest compression, Defibrillation, ACLS medications, Ventilation/Endotracheal Intubation, Non-invasive Ventilation,  Cardioversion, Temporary Pacemaker and Vasopressor.          Active Problem List  Active Problems:    Pneumonia       Documents Filled Out As Part of Discussion               [facility]   Order put in EMR.      Acknowledgement of patient's right to decline advance care planning:  The patient does not lack medical decision making capacity .  The patient/surrogate was informed that this session is completely voluntary and was given the opportunity to decline the ACP services. The patient/surrogate made the decision to participate in the ACP session with the discussion proceeding as described above.     Reference:  Detering et al, BMJ 2010; 340 doi: PopPath.it (Published 13 February 2009)  Advance care planning:    Improves end of life care   Improves patient & family satisfaction   Reduces stress, anxiety, & depression in surviving relatives   [phone] - first 30 minutes face-to-face    [phone] - each additional 30 minutes face-to-face    I have spent 18 minutes on face-to-face Advance Care Planning Services   100% of the time was spent on discussion and counseling the patient.   No active management of the problems listed above was undertaken during the time period reported.            MRN:  [id number]           CSN: [id number]  DOB: August 16, 1919   Alvino Blood, MD  05/04/17 6:47 PM   [facility]  [address]  [place], [id number]  [phone]

## 2017-05-04 NOTE — ED Notes (Signed)
Bed: NA10-A  Expected date:   Expected time:   Means of arrival:   Comments:  EMS

## 2017-05-04 NOTE — ED Provider Notes (Signed)
Physician/Midlevel provider first contact with patient: 05/04/17 1611         History     Chief Complaint   Patient presents with   . Shortness of Breath     Chief complaint: "I have pneumonia"  Onset: Several days  Severity: High    Patient is a 83 year old female who presents via EMS from [facility] nursing home in [place] city due to symptoms of cough, shortness of breath, and low oxygen saturations. Patient has been having cough and cold-like symptoms for the past 3 days. Her primary physician made a house visit this morning and felt she needed hospitalization.  She has no prior history of COPD or asthma. She is not oxygen dependent.    Upon arrival to the ED, her pulse ox was 88% on room air.        Past Medical History:   Diagnosis Date   . Abnormal vision    . Arthritis    . Hip fx, right, closed, initial encounter 2010   . Hyperlipidemia    . Hypertension    . Low back pain    . Macular degeneration    . Shingles        Past Surgical History:   Procedure Laterality Date   . APPENDECTOMY     . EYE SURGERY      cateracts   . HIP SURGERY Bilateral     Tubes tied   . TONSILLECTOMY     . TUBAL LIGATION         History reviewed. No pertinent family history.    Social  Social History   Substance Use Topics   . Smoking status: Never Smoker   . Smokeless tobacco: Never Used   . Alcohol use No       .     Allergies   Allergen Reactions   . Codeine Nausea And Vomiting and Other (See Comments)     LIGHTHEADEDNESS.   . Darvon [Propoxyphene] Nausea And Vomiting and Other (See Comments)     LIGHTHEADEDNESS.   Marland Kitchen Phenobarbital Swelling and Rash       Home Medications     Med List Status:  Pharmacy Completed Set By: Hart Robinsons, PharmD at 05/04/2017  6:29 PM                aspirin EC 81 MG EC tablet     Take 81 mg by mouth every morning.         Calcium Carbonate-Vitamin D (CALTRATE 600+D PO)     Take 1 tablet by mouth every morning.     Cholecalciferol (VITAMIN D) 1000 UNIT tablet     Take 1,000 Units by mouth every  morning.         dorzolamide (TRUSOPT) 2 % ophthalmic solution     Place 1 drop into the left eye 3 (three) times daily.        furosemide (LASIX) 40 MG tablet     Take 40 mg by mouth Once each morning except Saturday and Sunday.Take daily Monday through Friday ONLY         metoprolol XL (TOPROL-XL) 50 MG 24 hr tablet     Take 50 mg by mouth every morning.         Multiple Vitamins-Minerals (CENTRUM SILVER 50+WOMEN PO)     Take 1 tablet by mouth every morning.     Multiple Vitamins-Minerals (PRESERVISION AREDS PO)     Take 1 capsule by mouth every  morning.     potassium chloride 20 MEQ Tab CR     Take 20 mEq by mouth every morning.     pravastatin (PRAVACHOL) 40 MG tablet     Take 40 mg by mouth nightly.               Review of Systems   Constitutional: Positive for activity change, appetite change, chills and fever.   HENT: Negative.    Respiratory: Positive for cough, shortness of breath and wheezing.    Cardiovascular: Negative for chest pain and leg swelling.   Neurological: Negative.    Hematological: Negative.    All other systems reviewed and are negative.      Physical Exam    BP: 106/57, Heart Rate: (!) 102, Temp: 99.2 F (37.3 C), Resp Rate: 15, SpO2: 92 %, Weight: 68.9 kg     Physical Exam   Constitutional: She is oriented to person, place, and time. She appears well-developed and well-nourished. No distress.   HENT:   Head: Normocephalic and atraumatic.   Eyes: Conjunctivae and EOM are normal.   Neck: Normal range of motion. Neck supple.   Cardiovascular: Normal rate, regular rhythm, normal heart sounds and intact distal pulses.    Pulmonary/Chest: She has wheezes.   Diffuse rhonchi   Abdominal: Soft. There is no tenderness.   Musculoskeletal: Normal range of motion. She exhibits edema.   Nonpitting edema equal bilaterally   Neurological: She is alert and oriented to person, place, and time.   Skin: Skin is warm and dry. No erythema.   Psychiatric: She has a normal mood and affect. Her behavior is  normal.   Nursing note and vitals reviewed.        MDM and ED Course     ED Medication Orders     Start Ordered     Status Ordering Provider    05/04/17 1709 05/04/17 1708  azithromycin (ZITHROMAX) tablet 500 mg  Once in ED     Route: Oral  Ordered Dose: 500 mg     Last MAR action:  Given Leelan Rajewski ANNE    05/04/17 1708 05/04/17 1707  albuterol-ipratropium (DUO-NEB) 2.5-0.5(3) mg/3 mL nebulizer 3 mL  RT - Once     Route: Nebulization  Ordered Dose: 3 mL     Last MAR action:  Given Damond Borchers ANNE    05/04/17 1708 05/04/17 1707  methylPREDNISolone sodium succinate (Solu-MEDROL) injection 40 mg  Once in ED     Route: Intravenous  Ordered Dose: 40 mg     Last MAR action:  Given Akhila Mahnken ANNE    05/04/17 1708 05/04/17 1708  cefTRIAXone (ROCEPHIN) 1 g in sodium chloride bacteriostatic 0.9 % 10 mL Injection  Once in ED     Route: Intravenous  Ordered Dose: 1 g     Last MAR action:  Given Kavon Valenza ANNE         No results found.   Results     Procedure Component Value Units Date/Time    Basic Metabolic Panel [[id number]]  (Abnormal) Collected:  05/05/17 0604    Specimen:  Plasma Updated:  05/05/17 0753     Sodium 138 mMol/L      Potassium 4.8 mMol/L      Chloride 107 mMol/L      CO2 24.9 mMol/L      Calcium 9.1 mg/dL      Glucose 409 (H) mg/dL      Creatinine 8.11 mg/dL  BUN 16 mg/dL      Anion Gap 16.1 mMol/L      BUN/Creatinine Ratio 17.8 Ratio      EGFR 54 (L) mL/min/1.19m2      Osmolality Calc 281 mOsm/kg     CBC [[id number]]  (Abnormal) Collected:  05/05/17 0604    Specimen:  Blood from Blood Updated:  05/05/17 0744     WBC 8.6 K/cmm      RBC 3.10 (L) M/cmm      Hemoglobin 10.3 (L) gm/dL      Hematocrit 81.1 (L) %      MCV 102 (H) fL      MCH 33 pg      MCHC 32 gm/dL      RDW 91.4 (H) %      PLT CT 185 K/cmm      MPV 8.3 fL     CBC and differential [[id number]]  (Abnormal) Collected:  05/04/17 1542    Specimen:  Blood from Blood Updated:  05/05/17 0237     WBC 9.0 K/cmm      RBC 3.23 (L) M/cmm       Hemoglobin 10.5 (L) gm/dL      Hematocrit 08.6 (L) %      MCV 100 fL      MCH 32 pg      MCHC 32 gm/dL      RDW 57.8 (H) %      PLT CT 194 K/cmm      MPV 7.5 fL      NEUTROPHIL % 72.8 %      Lymphocytes 14.6 (L) %      Monocytes 11.5 %      Eosinophils % 0.8 %      Basophils % 0.3 %      Neutrophils Absolute 6.6 K/cmm      Lymphocytes Absolute 1.3 K/cmm      Monocytes Absolute 1.0 K/cmm      Eosinophils Absolute 0.1 K/cmm      BASO Absolute 0.0 K/cmm      RBC Morphology RBC Morphology Reviewed     Macrocytic 1+     Microcytic 1+     Anisocytosis 1+     Polychromasia 1+    B-type Natriuretic Peptide [[id number]]  (Abnormal) Collected:  05/04/17 1542    Specimen:  Blood Updated:  05/04/17 2039     B-Natriuretic Peptide 126.3 (H) pg/mL     Basic Metabolic Panel [[id number]]  (Abnormal) Collected:  05/04/17 1542    Specimen:  Plasma Updated:  05/04/17 2039     Sodium 139 mMol/L      Potassium 4.1 mMol/L      Chloride 104 mMol/L      CO2 27.7 mMol/L      Calcium 9.5 mg/dL      Glucose 272 (H) mg/dL      Creatinine 5.36 mg/dL      BUN 15 mg/dL      Anion Gap 64.4 mMol/L      BUN/Creatinine Ratio 15.5 Ratio      EGFR 49 (L) mL/min/1.95m2      Osmolality Calc 281 mOsm/kg     Troponin I (Stat) [[id number]] Collected:  05/04/17 1535    Specimen:  Plasma Updated:  05/04/17 1737     Troponin I 0.01 ng/mL         EKG revealed normal sinus rhythm, rate 98. No ST elevation.    MDM  Number of Diagnoses or Management Options  Hypoxia: new  and requires workup  Pneumonia of right lower lobe due to infectious organism: new and requires workup     Amount and/or Complexity of Data Reviewed  Clinical lab tests: ordered and reviewed  Tests in the radiology section of CPT: ordered and reviewed  Discuss the patient with other providers: yes    Risk of Complications, Morbidity, and/or Mortality  Presenting problems: high    Patient Progress  Patient progress: stable      Patient is a 44 old female who presents with symptoms of cough,  chills, and hypoxia. Differential diagnosis includes bronchitis, pneumonia, CHF    Laboratory data obtained with a CBC, chemistry, BNP, and troponin unremarkable    Chest x-ray revealed evidence of infiltrate.  She was given IV antibiotic therapy and 02 and IVF's.    Plans for admission.             Procedures    Clinical Impression & Disposition     Clinical Impression  Final diagnoses:   Pneumonia of right lower lobe due to infectious organism   Hypoxia        ED Disposition     ED Disposition Condition Date/Time Comment    Admit  Tue May 04, 2017  6:24 PM Admitting Physician: Alvino Blood [[id number]]   Diagnosis: Pneumonia [227785]   Estimated Length of Stay: > or = to 2 midnights   Tentative Discharge Plan?: Home or Self Care [1]   Patient Class: Inpatient [101]             Discharge Medication List as of 05/05/2017  3:14 PM      START taking these medications    Details   guaiFENesin (ROBITUSSIN) 100 MG/5ML Solution Take 10 mLs (200 mg total) by mouth every 4 (four) hours as needed (cough)., Starting Wed 05/05/2017, Print                       Crysta Gulick, Valetta Fuller, MD  05/10/17 1010

## 2017-05-05 LAB — CBC
Hematocrit: 31.7 % — ABNORMAL LOW (ref 36.0–48.0)
Hemoglobin: 10.3 gm/dL — ABNORMAL LOW (ref 12.0–16.0)
MCH: 33 pg (ref 28–35)
MCHC: 32 gm/dL (ref 32–36)
MCV: 102 fL — ABNORMAL HIGH (ref 80–100)
MPV: 8.3 fL (ref 6.0–10.0)
PLT CT: 185 10*3/uL (ref 130–440)
RBC: 3.1 10*6/uL — ABNORMAL LOW (ref 3.80–5.00)
RDW: 17.6 % — ABNORMAL HIGH (ref 11.0–14.0)
WBC: 8.6 10*3/uL (ref 4.0–11.0)

## 2017-05-05 LAB — CBC AND DIFFERENTIAL
Basophils %: 0.3 % (ref 0.0–3.0)
Basophils Absolute: 0 10*3/uL (ref 0.0–0.3)
Eosinophils %: 0.8 % (ref 0.0–7.0)
Eosinophils Absolute: 0.1 10*3/uL (ref 0.0–0.8)
Hematocrit: 32.3 % — ABNORMAL LOW (ref 36.0–48.0)
Hemoglobin: 10.5 gm/dL — ABNORMAL LOW (ref 12.0–16.0)
Lymphocytes Absolute: 1.3 10*3/uL (ref 0.6–5.1)
Lymphocytes: 14.6 % — ABNORMAL LOW (ref 15.0–46.0)
MCH: 32 pg (ref 28–35)
MCHC: 32 gm/dL (ref 32–36)
MCV: 100 fL (ref 80–100)
MPV: 7.5 fL (ref 6.0–10.0)
Monocytes Absolute: 1 10*3/uL (ref 0.1–1.7)
Monocytes: 11.5 % (ref 3.0–15.0)
Neutrophils %: 72.8 % (ref 42.0–78.0)
Neutrophils Absolute: 6.6 10*3/uL (ref 1.7–8.6)
PLT CT: 194 10*3/uL (ref 130–440)
RBC: 3.23 10*6/uL — ABNORMAL LOW (ref 3.80–5.00)
RDW: 17.4 % — ABNORMAL HIGH (ref 11.0–14.0)
WBC: 9 10*3/uL (ref 4.0–11.0)

## 2017-05-05 LAB — BASIC METABOLIC PANEL
Anion Gap: 10.9 mMol/L (ref 7.0–18.0)
BUN / Creatinine Ratio: 17.8 Ratio (ref 10.0–30.0)
BUN: 16 mg/dL (ref 7–22)
CO2: 24.9 mMol/L (ref 20.0–30.0)
Calcium: 9.1 mg/dL (ref 8.5–10.5)
Chloride: 107 mMol/L (ref 98–110)
Creatinine: 0.9 mg/dL (ref 0.60–1.20)
EGFR: 54 mL/min/{1.73_m2} — ABNORMAL LOW (ref 60–150)
Glucose: 168 mg/dL — ABNORMAL HIGH (ref 71–99)
Osmolality Calc: 281 mOsm/kg (ref 275–300)
Potassium: 4.8 mMol/L (ref 3.5–5.3)
Sodium: 138 mMol/L (ref 136–147)

## 2017-05-05 MED ORDER — DORZOLAMIDE HCL 2 % OP SOLN
1.0000 [drp] | Freq: Three times a day (TID) | OPHTHALMIC | Status: DC
Start: 2017-05-05 — End: 2017-05-05

## 2017-05-05 MED ORDER — CEFDINIR 300 MG PO CAPS
300.0000 mg | ORAL_CAPSULE | Freq: Two times a day (BID) | ORAL | 0 refills | Status: DC
Start: 2017-05-05 — End: 2017-05-05

## 2017-05-05 MED ORDER — CEFDINIR 300 MG PO CAPS
300.0000 mg | ORAL_CAPSULE | Freq: Two times a day (BID) | ORAL | 0 refills | Status: AC
Start: 2017-05-05 — End: 2017-05-11

## 2017-05-05 MED ORDER — CEFDINIR 300 MG PO CAPS
300.0000 mg | ORAL_CAPSULE | Freq: Two times a day (BID) | ORAL | Status: DC
Start: 2017-05-05 — End: 2017-05-05

## 2017-05-05 MED ORDER — GUAIFENESIN 100 MG/5ML PO SOLN
200.0000 mg | ORAL | 0 refills | Status: DC | PRN
Start: 2017-05-05 — End: 2017-08-11

## 2017-05-05 NOTE — Plan of Care (Signed)
Problem: Compromised Tissue integrity  Goal: Damaged tissue is healing and protected  Outcome: Adequate for Discharge   05/05/17 1558   Goal/Interventions addressed this shift   Damaged tissue is healing and protected  Reposition patient every 2 hours and as needed unless able to reposition self;Increase activity as tolerated/progressive mobility;Relieve pressure to bony prominences for patients at moderate and high risk;Avoid shearing injuries;Monitor/assess Braden scale every shift;Use bath wipes, not soap and water, for daily bathing;Keep intact skin clean and dry;Use incontinence wipes for cleaning urine, stool and caustic drainage. Foley care as needed;Monitor external devices/tubes for correct placement to prevent pressure, friction and shearing;Monitor patient's hygiene practices;Encourage use of lotion/moisturizer on skin;Consult/collaborate with wound care nurse;Utilize specialty bed       Problem: Compromised Hemodynamic Status  Goal: Vital signs and fluid balance maintained/improved  Outcome: Progressing   05/05/17 1558   Goal/Interventions addressed this shift   Vital signs and fluid balance are maintained/improved Position patient for maximum circulation/cardiac output;Monitor/assess vitals and hemodynamic parameters with position changes;Monitor/assess lab values and report abnormal values;Monitor intake and output. Notify LIP if urine output is less than 30 mL/hour.       Comments: Pt discharged to ALF by car with friend. Pt sent with paper prescriptions and e-scripts.

## 2017-05-05 NOTE — Discharge Instr - Diet (Signed)
2 gram Sodium Restricted Diet

## 2017-05-05 NOTE — Progress Notes (Signed)
05/05/17 1448   Patient Type   Within 30 Days of Previous Admission? No   Healthcare Decisions   Interviewed: Patient   Orientation/Decision Making Abilities of Patient Alert and Oriented x3, able to make decisions   Prior to admission   Prior level of function Ambulates independently   Type of Residence Assisted living   Home Layout One level   Have running water, electricity, heat, etc? Yes   Living Arrangements Other (Comment)   How do you get to your MD appointments? friends/ALF   How do you get your groceries? friends/ALF   Who fixes your meals? friends/ALF   Who does your laundry? friends/ALF   Who picks up your prescriptions? friends/ALF   Dressing Independent   Grooming Independent   Feeding Independent   Bathing Independent   Toileting Independent   Name of Prior Assisted Living Facility [facility]    Discharge Planning   Patient expects to be discharged to: ALF    Mode of transportation: Private car (family member)

## 2017-05-05 NOTE — Progress Notes (Signed)
05/05/17 1453   Discharge Disposition   Patient preference/choice provided? Yes   Physical Discharge Disposition Assisted Living Facility   Name of Assisted Living Facility [facility]    Mode of Transportation Car   Pick up time 1500   Patient/Family/POA notified of transfer plan Patient informed only   Patient agreeable to discharge plan/expected d/c date? Yes     Patient is from [facility] and has been discharged. DCP called and spoke with facility, after talking to DCP they did not need a report called. They did ask for paperwork to be faxed. DCP completed. Patient requesting for her meds to be escribed. Called MD and meds were sent to [facility] in [facility]. Patient's friend will be driving her home.    Meriam Sprague RN  Discharge Planner   [phone]

## 2017-05-05 NOTE — Discharge Instr - Activity (Signed)
As Tolerated

## 2017-05-05 NOTE — UM Notes (Signed)
[facility] Utilization Management Review Sheet    Facility :  [facility]    NAME: Jai Bear Cast  MR#: [id number]    CSN#: [id number]     DOB: Nov 12, 1919    ROOM: 545/545-A AGE: 83 y.o.    ADMIT DATE AND TIME: 05/04/2017  3:24 PM      PATIENT CLASS: Inpatient 05/04/2017 @ 1824     ATTENDING PHYSICIAN: Alvino Blood, MD  PAYOR:Payor: MEDICARE / Plan: MEDICARE PART A AND B / Product Type: Medicare /       AUTH #:     DIAGNOSIS:     ICD-10-CM    1. Pneumonia of right lower lobe due to infectious organism J18.1    2. Hypoxia R09.02        HISTORY:   Past Medical History:   Diagnosis Date   . Abnormal vision    . Arthritis    . Hip fx, right, closed, initial encounter 2010   . Hyperlipidemia    . Hypertension    . Low back pain    . Macular degeneration    . Shingles      ED TREATMENT: DuoNeb 3ml Zithromax 500mg  PO Rocephin 1g IV Solumedrol 40mg  IV     "Thomasine Klutts Lacroix is a 83 y.o. female with PMHx of HTN, hyperlipidemia, NH resident who presents to the hospital with cough and SOB.   Patient was in her usual state of health until 3 days ago when she started to have cough and SOB. She advises she has not felt the whole last with a flu-like symptoms. Her PCP made a house visit today and advised her to come to the ER.   Work up in the ER showed R basilar infiltrate for which she received Rocephin and Azithromycin for CAP.   Had fever this morning associated with chills  No CP. NO N,V,D.   No urinary complaint" per Dr. Darin Engels H&P    LABS: Hgb 10.5 Hct 32.3 RBC 3.23 Glucose 139 EGFR 49 BNP 126.3     XR Chest 2 Views:  Mild right basilar infiltrate and small right pleural effusion.    VITALS: T99.2 P102 R15 BP106/57 Sat 92% 4L O2    ASSESSMENT AND PLAN:    Pneumonia  Admit to medical unit  IV antibiotic per protocol  Blood culture x2, further antibiotics per c & s  Respiratory Rx as needed, CXR in am    HTN (hypertension)  controlled, continue home meds with hold parameters    Hyperlipidemia  Continue  with home meds    Admit to [facility] Pulmonary vitals q 8 hrs SCD low sodium diet     MEDICATIONS: Trusopt 1 drop 3 times daily left eye Lactobacillus Po Daily Pravachol 40mg  PO @ HS Robitussin 200mg  PO q 4 hrs PRN x 1     Zoey Bidwell L. Danella Penton RN BSN  [facility]  Utilization Review  [phone]  Fax [phone]

## 2017-05-05 NOTE — Discharge Summary (Addendum)
DISCHARGE SUMMARY - [facility]    Patient Name: Kelli Brown  Attending Physician: Alvino Blood, MD  Primary Care Physician: Kelli Dibble, MD    Date of Admission: 05/04/2017  Date of Discharge: 05/05/2017    Discharge Diagnoses:                                                         [facility]       Pneumonia  Improving  D/c IV abx  Will discharge on Omnicef    HTN (hypertension):   controlled, continue home meds    Hyperlipidemia  Continue with home meds        Hospital Course:                               Kelli Brown is a 83 y.o. female with PMHx of HTN, hyperlipidemia, NH resident who presents to the hospital with cough and SOB.   Patient was in her usual state of health until 3 days ago when she started to have cough and SOB. She advises she has not felt the whole last with a flu-like symptoms. Her PCP made a house visit today and advised her to come to the ER.   Work up in the ER showed R basilar infiltrate for which she received Rocephin and Azithromycin for CAP.   Had fever this morning associated with chills  No CP. NO N,V,D.   No urinary complaint  (Please see admission History and Physical for details.)    Pt was admitted to medical floor with the impression of pneumonia. She was put on IV Rocephin and Azithromycin. She is being discharged home with improvement.          Discharge Day Physical Exam:  Temp:  [97.3 F (36.3 C)-99.6 F (37.6 C)] 97.6 F (36.4 C)  Heart Rate:  [82-107] 93  Resp Rate:  [15-24] 18  BP: (102-117)/(46-76) 104/49  Body mass index is 28.7 kg/m.    General: awake, alert, oriented x 3; no acute distress.  HEENT: perrla, eomi, sclera anicteric  oropharynx clear without lesions, mucous membranes moist  Glands: No cervical or axillary lymphadenopathy.  Neck: supple, no lymphadenopathy, no thyromegaly, no JVD, no carotid bruits  Cardiovascular: regular rate and rhythm, no rubs or gallops  Lungs: clear to auscultation  bilaterally, without wheezing, rhonchi, or rales  Abdomen: soft, non-tender, non-distended; no palpable masses, no hepatosplenomegaly, normoactive bowel sounds, no rebound or guarding  Extremities: no clubbing, cyanosis, or edema  Neuro: cranial nerves grossly intact, strength 5/5 in upper and lower extremities, sensation intact,   Psych: Normal affect, not depressed   Skin: no rashes or lesions noted    Discharge Instructions:                                                 [facility]        Diet:2 gm Sodium diet    Activity/Weight Bearing Status: As tolerated.    FOLLOWUPFlossie Dibble, MD  [address]  [place]  [address]  [phone]            Complete instructions and follow up are in the patient's After Visit Summary (AVS).    Discharge Medications:                                                   [facility]          Medication List      START taking these medications    cefdinir 300 MG capsule  Commonly known as:  OMNICEF  Take 1 capsule (300 mg total) by mouth every 12 (twelve) hours.for 6 days     guaiFENesin 100 MG/5ML Soln  Commonly known as:  ROBITUSSIN  Take 10 mLs (200 mg total) by mouth every 4 (four) hours as needed (cough).        CONTINUE taking these medications    aspirin EC 81 MG EC tablet     CALTRATE 600+D PO     * CENTRUM SILVER 50+WOMEN PO     * PRESERVISION AREDS PO     dorzolamide 2 % ophthalmic solution  Commonly known as:  TRUSOPT     furosemide 40 MG tablet  Commonly known as:  LASIX     metoprolol succinate XL 50 MG 24 hr tablet  Commonly known as:  TOPROL-XL     potassium chloride 20 MEQ Tbcr     pravastatin 40 MG tablet  Commonly known as:  PRAVACHOL     vitamin D 1000 UNIT tablet  Commonly known as:  cholecalciferol        * This list has 2 medication(s) that are the same as other medications prescribed for you. Read the directions carefully, and ask your doctor or other care provider to review them with you.               Where to Get Your Medications       You can get these medications from any pharmacy    Bring a paper prescription for each of these medications   cefdinir 300 MG capsule   guaiFENesin 100 MG/5ML Soln           Consultations:                                                                    [facility]         Treatment Team: Attending Provider: Alvino Blood, MD; Consulting Physician: Alvino Blood, MD; Registered Nurse: Elana Alm, RN; Technician: Marts, Jule Economy, CNA; Registered Nurse: Margarita Sermons, RN        Discharge Condition:                                                     [facility]       The patient was discharged in stable condition.    Total time spent in counseling and/or coordination of discharge plan with other physicians, other  health care professionals, patient and/or family is 35 minutes.    Signed by: Alvino Blood, MD  Pager # [phone] [facility]  [address]  [place], [place]    CC: Kelli Dibble, MD

## 2017-05-09 LAB — ECG 12-LEAD
P Wave Axis: 51 deg
P-R Interval: 153 ms
Patient Age: 89 years
Q-T Interval(Corrected): 414 ms
Q-T Interval: 324 ms
QRS Axis: 32 deg
QRS Duration: 73 ms
T Axis: 44 years
Ventricular Rate: 98 //min

## 2017-08-04 ENCOUNTER — Encounter: Payer: Self-pay | Admitting: Family Medicine

## 2017-08-04 DIAGNOSIS — I272 Pulmonary hypertension, unspecified: Secondary | ICD-10-CM

## 2017-08-04 DIAGNOSIS — I350 Nonrheumatic aortic (valve) stenosis: Secondary | ICD-10-CM

## 2017-08-10 ENCOUNTER — Telehealth: Payer: Self-pay

## 2017-08-10 NOTE — Telephone Encounter (Signed)
Recent PCP records scanned.

## 2017-08-11 ENCOUNTER — Encounter: Payer: Self-pay | Admitting: Cardiovascular Disease

## 2017-08-11 ENCOUNTER — Ambulatory Visit: Payer: Medicare Other | Admitting: Cardiovascular Disease

## 2017-08-11 VITALS — BP 122/58 | HR 82 | Ht 60.0 in | Wt 156.2 lb

## 2017-08-11 DIAGNOSIS — I350 Nonrheumatic aortic (valve) stenosis: Secondary | ICD-10-CM

## 2017-08-11 DIAGNOSIS — I272 Pulmonary hypertension, unspecified: Secondary | ICD-10-CM

## 2017-08-11 HISTORY — DX: Nonrheumatic aortic (valve) stenosis: I35.0

## 2017-08-11 NOTE — Progress Notes (Signed)
Cardiology Consult      Patient Name: Kelli Brown   Date of Birth: September 02, 1919    Provider: Carlyon Shadow, MD     Patient Care Team:  Flossie Dibble, MD as PCP - General (Family Medicine)  Flossie Dibble, MD as Consulting Physician (Family Medicine)  Carlyon Shadow, MD as Consulting Physician (Cardiology)    Chief Complaint: Aortic Stenosis (new patient); Hypertension; and Leg Swelling      History of Present Illness   [name] is a 83 y.o. female referred today due to aortic stenosis and pulmonary hypertension. She was previously seen at [facility]. We have not yet received their files. Her primary care physician is Dr. Tylene Fantasia.     She denies angina or need for nitroglycerin. She denies congestive heart failure. She denies syncope, tachycardia, or recent falls. She takes 5 Lasix a week due to leg edema. She has never smoked. She was last hospitalized from 05/04/17 to 05/05/17 due to pneumonia.    She has been widowed since age 32 in 82. Her only son died due to cancer of the intestines around age 3. 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 She is currently living in assisted living. [name]      Review of Systems     Review of Systems   Constitutional: Negative for chills, diaphoresis, fever, malaise/fatigue and weight loss.   HENT: Negative for congestion, ear discharge, ear pain, hearing loss, nosebleeds, sinus pain, sore throat and tinnitus.    Eyes: Negative for blurred vision, double vision, photophobia, pain, discharge and redness.   Respiratory: Negative for cough, hemoptysis, sputum production, shortness of breath, wheezing and stridor.    Cardiovascular: Positive for leg swelling. Negative for chest pain, palpitations, orthopnea, claudication and PND.   Gastrointestinal: Negative for abdominal pain, blood in stool, constipation, diarrhea, heartburn, melena, nausea and vomiting.   Genitourinary: Positive for frequency. Negative for  dysuria, flank pain, hematuria and urgency.   Musculoskeletal: Positive for back pain. Negative for falls, joint pain, myalgias and neck pain.   Skin: Negative for itching and rash.   Neurological: Positive for weakness. Negative for dizziness, tingling, tremors, sensory change, speech change, focal weakness, seizures, loss of consciousness and headaches.   Endo/Heme/Allergies: Negative for environmental allergies and polydipsia. Bruises/bleeds easily.   Psychiatric/Behavioral: Negative for depression, hallucinations, memory loss, substance abuse and suicidal ideas. The patient is not nervous/anxious and does not have insomnia.        Past Medical History     1. Aortic stenosis.   2. Pulmonary hypertension.   3. Leg edema.    Past Surgical History     Past Surgical History:   Procedure Laterality Date   . APPENDECTOMY     . EYE SURGERY      cateracts   . HIP SURGERY Bilateral     Tubes tied   . TONSILLECTOMY     . TUBAL LIGATION         Family History     Family History   Problem Relation Age of Onset   . Stroke Mother    . Hypertension Mother    . Coronary artery disease Mother    . Cancer Father    . Stroke Sister    . CABG Brother        Social History     Social History   Substance Use Topics   . Smoking status: Never  Smoker   . Smokeless tobacco: Never Used   . Alcohol use No     She has been widowed since age 41 in 18. Her only son died due to cancer of the intestines around age 22. 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 She is currently living in assisted living---[facility].     Allergies     Allergies   Allergen Reactions   . Codeine Nausea And Vomiting and Other (See Comments)     LIGHTHEADEDNESS.   . Darvon [Propoxyphene] Nausea And Vomiting and Other (See Comments)     LIGHTHEADEDNESS.   Marland Kitchen Phenobarbital Swelling and Rash       Medications     Current Outpatient Prescriptions   Medication Sig   . aspirin EC 81 MG EC tablet Take 81 mg by mouth every morning.       . Calcium  Carbonate-Vitamin D (CALTRATE 600+D PO) Take 1 tablet by mouth every morning.   . dorzolamide (TRUSOPT) 2 % ophthalmic solution Place 1 drop into the left eye 3 (three) times daily.      . furosemide (LASIX) 40 MG tablet Take 40 mg by mouth Once each morning except Saturday and Sunday.Take daily Monday through Friday ONLY       . metoprolol XL (TOPROL-XL) 50 MG 24 hr tablet Take 50 mg by mouth every morning.       . Multiple Vitamins-Minerals (CENTRUM SILVER 50+WOMEN PO) Take 1 tablet by mouth every morning.   . Multiple Vitamins-Minerals (PRESERVISION AREDS PO) Take 1 capsule by mouth every morning.   . potassium chloride 20 MEQ Tab CR Take 20 mEq by mouth every morning.   . pravastatin (PRAVACHOL) 40 MG tablet Take 40 mg by mouth nightly.           Physical Exam     Visit Vitals  BP 122/58 (BP Site: Right arm)   Pulse 82   Ht 1.524 m (5')   Wt 70.9 kg (156 lb 3.2 oz)   BMI 30.51 kg/m     Vitals:    08/11/17 1135 08/11/17 1136   BP: 124/60 122/58   BP Site: Left arm Right arm   Pulse:  82   Weight: 70.9 kg (156 lb 3.2 oz)    Height: 1.524 m (5')      Wt Readings from Last 3 Encounters:   08/11/17 70.9 kg (156 lb 3.2 oz)   05/04/17 68.9 kg (151 lb 14.4 oz)   01/13/17 68.9 kg (151 lb 14.4 oz)        Constitutional -  Well appearing, older female  Amazing for 89  in no distress  Eyes - Pupils equal and reactive, extraocular eye movements intact, sclera anicteric  Oropharynx - Moist mucous membranes  Respiratory - Clear to auscultation bilaterally, normal respiratory effort    Cardiovascular system -    Regular rate and rhythm    Normal S1, S2    [place] AS murmur widely radiating   Jugular venous pulse is normal        Carotid upstroke normal, no carotid bruits auscultated   1+ pulses in the posterior tibial / dorsalis pedis bilaterally    Neurological - Alert, oriented, no focal neurological deficits  Extremities -  No clubbing or cyanosis. 1+ ankle edema right, 1-2+ left.   Skin - Warm and dry, few scattered  ecchymotic areas  Psych- Appropriate affect    Labs     Lab Results  Component Value Date/Time    WBC 8.6 05/05/2017 06:04 AM    RBC 3.10 (L) 05/05/2017 06:04 AM    HGB 10.3 (L) 05/05/2017 06:04 AM    HCT 31.7 (L) 05/05/2017 06:04 AM    PLT 185 05/05/2017 06:04 AM       Lab Results   Component Value Date/Time    NA 138 05/05/2017 06:04 AM    K 4.8 05/05/2017 06:04 AM    CL 107 05/05/2017 06:04 AM    CO2 24.9 05/05/2017 06:04 AM    GLU 168 (H) 05/05/2017 06:04 AM    BUN 16 05/05/2017 06:04 AM    CREAT 0.90 05/05/2017 06:04 AM    PROT 7.6 10/23/2014 03:06 PM    ALKPHOS 84 10/23/2014 03:06 PM    AST 27 10/23/2014 03:06 PM    ALT 18 10/23/2014 03:06 PM       Lab Results   Component Value Date/Time    CHOL 191 11/24/2016 02:06 PM    TRIG 82 11/24/2016 02:06 PM    HDL 62 11/24/2016 02:06 PM    LDL 113 11/24/2016 02:06 PM     01/13/17- Chol 187, Trig 124, HDL 65, LDL 97, Na 146, K 4.5, BUN 16, Creat 1.06, AG ratio 1.9, Alb 4.1, LFTs normal, Ca 9.3, Gluc 96, GFR 45    08/03/17- WBC 6.9, HGB 10.6, HCT 34.2, PLT 196, Na 145, K 4.6, BUN 23, Creat 1.2, Ca 9.7, Gluc 65, GFR 38    Cardiogenics:     EKG:  Normal sinus rhythm left atrial abnormality otherwise normal    Impression and Recommendations:     1. Aortic stenosis  - Echo (TTE); Future    2. Pulmonary hypertension  - Echo (TTE); Future    3. Edema    PLAN: Continue current medications. I have ordered updated echocardiography. I have requested [facility] records  She has living will    Carlyon Shadow, MD  08/11/2017    This note was scribed by Kelly Splinter on behalf of Carlyon Shadow, MD.

## 2017-08-25 ENCOUNTER — Ambulatory Visit
Admission: RE | Admit: 2017-08-25 | Discharge: 2017-08-25 | Disposition: A | Discharge status: Home / Self Care | Payer: Medicare Other | Source: Ambulatory Visit | Attending: Family Medicine | Admitting: Family Medicine

## 2017-08-25 DIAGNOSIS — I110 Hypertensive heart disease with heart failure: Secondary | ICD-10-CM | POA: Insufficient documentation

## 2017-08-25 DIAGNOSIS — I272 Pulmonary hypertension, unspecified: Secondary | ICD-10-CM | POA: Insufficient documentation

## 2017-08-25 DIAGNOSIS — I509 Heart failure, unspecified: Secondary | ICD-10-CM | POA: Insufficient documentation

## 2017-08-25 DIAGNOSIS — E785 Hyperlipidemia, unspecified: Secondary | ICD-10-CM | POA: Insufficient documentation

## 2017-08-25 DIAGNOSIS — I340 Nonrheumatic mitral (valve) insufficiency: Secondary | ICD-10-CM | POA: Insufficient documentation

## 2017-08-25 DIAGNOSIS — I350 Nonrheumatic aortic (valve) stenosis: Secondary | ICD-10-CM | POA: Insufficient documentation

## 2017-09-16 ENCOUNTER — Telehealth: Payer: Self-pay

## 2017-09-16 NOTE — Telephone Encounter (Signed)
Can you take a look at her 10/3 Echo results and let me know?  I looked at it and it looks like normal EF, moderate AS; just want to be accurate.    Thanks!    Kelli Brown [name], CMA

## 2017-09-16 NOTE — Telephone Encounter (Signed)
Dr. Tressia Danas patient    Patient is calling in stating that she had an echo done on 10/3 and has not heard the results. She is asking if Dr. Tressia Danas can review the results and advise if any changes need to be made.  Could you please advise?    Thanks,  [name]

## 2017-09-16 NOTE — Telephone Encounter (Signed)
Echo shows  Normal EF 60%  Pulmonary hypertension  Moderate AS  Peak vel 3.5 peak grad 50 mean grad 24  AVA 0.9-1.0  Repeat echo in 1 year

## 2017-09-17 NOTE — Telephone Encounter (Signed)
Called and spoke with the patient regarding results. She stated understanding.    Kelli Brown, CMA

## 2017-10-25 ENCOUNTER — Encounter (INDEPENDENT_AMBULATORY_CARE_PROVIDER_SITE_OTHER): Payer: Self-pay | Admitting: Cardiovascular Disease

## 2017-11-22 ENCOUNTER — Emergency Department: Payer: Medicare Other

## 2017-11-22 ENCOUNTER — Inpatient Hospital Stay
Admission: EM | Admit: 2017-11-22 | Discharge: 2017-11-27 | DRG: 194 | Disposition: A | Discharge status: Nursing Home (Includes ICF) | Payer: Medicare Other | Source: Skilled Nursing Facility | Attending: Internal Medicine | Admitting: Internal Medicine

## 2017-11-22 DIAGNOSIS — Z888 Allergy status to other drugs, medicaments and biological substances status: Secondary | ICD-10-CM

## 2017-11-22 DIAGNOSIS — Z7982 Long term (current) use of aspirin: Secondary | ICD-10-CM

## 2017-11-22 DIAGNOSIS — J189 Pneumonia, unspecified organism: Principal | ICD-10-CM | POA: Diagnosis present

## 2017-11-22 DIAGNOSIS — J849 Interstitial pulmonary disease, unspecified: Secondary | ICD-10-CM | POA: Diagnosis present

## 2017-11-22 DIAGNOSIS — I350 Nonrheumatic aortic (valve) stenosis: Secondary | ICD-10-CM | POA: Diagnosis present

## 2017-11-22 DIAGNOSIS — E785 Hyperlipidemia, unspecified: Secondary | ICD-10-CM | POA: Diagnosis present

## 2017-11-22 DIAGNOSIS — R531 Weakness: Secondary | ICD-10-CM

## 2017-11-22 DIAGNOSIS — Z9851 Tubal ligation status: Secondary | ICD-10-CM

## 2017-11-22 DIAGNOSIS — I5032 Chronic diastolic (congestive) heart failure: Secondary | ICD-10-CM | POA: Diagnosis present

## 2017-11-22 DIAGNOSIS — I110 Hypertensive heart disease with heart failure: Secondary | ICD-10-CM | POA: Diagnosis present

## 2017-11-22 DIAGNOSIS — Z9849 Cataract extraction status, unspecified eye: Secondary | ICD-10-CM

## 2017-11-22 DIAGNOSIS — Z885 Allergy status to narcotic agent status: Secondary | ICD-10-CM

## 2017-11-22 DIAGNOSIS — J9801 Acute bronchospasm: Secondary | ICD-10-CM | POA: Diagnosis present

## 2017-11-22 DIAGNOSIS — Z8249 Family history of ischemic heart disease and other diseases of the circulatory system: Secondary | ICD-10-CM

## 2017-11-22 DIAGNOSIS — Z823 Family history of stroke: Secondary | ICD-10-CM

## 2017-11-22 DIAGNOSIS — I272 Pulmonary hypertension, unspecified: Secondary | ICD-10-CM | POA: Diagnosis present

## 2017-11-22 DIAGNOSIS — Z809 Family history of malignant neoplasm, unspecified: Secondary | ICD-10-CM

## 2017-11-22 DIAGNOSIS — R509 Fever, unspecified: Secondary | ICD-10-CM

## 2017-11-22 DIAGNOSIS — H3530 Unspecified macular degeneration: Secondary | ICD-10-CM | POA: Diagnosis present

## 2017-11-22 DIAGNOSIS — J168 Pneumonia due to other specified infectious organisms: Secondary | ICD-10-CM

## 2017-11-22 DIAGNOSIS — I959 Hypotension, unspecified: Secondary | ICD-10-CM | POA: Diagnosis present

## 2017-11-22 DIAGNOSIS — I10 Essential (primary) hypertension: Secondary | ICD-10-CM

## 2017-11-22 DIAGNOSIS — I480 Paroxysmal atrial fibrillation: Secondary | ICD-10-CM | POA: Diagnosis present

## 2017-11-22 DIAGNOSIS — J181 Lobar pneumonia, unspecified organism: Secondary | ICD-10-CM

## 2017-11-22 DIAGNOSIS — R0902 Hypoxemia: Secondary | ICD-10-CM | POA: Diagnosis present

## 2017-11-22 DIAGNOSIS — Z66 Do not resuscitate: Secondary | ICD-10-CM | POA: Diagnosis present

## 2017-11-22 LAB — CBC AND DIFFERENTIAL
Basophils %: 0.7 % (ref 0.0–3.0)
Basophils Absolute: 0 10*3/uL (ref 0.0–0.3)
Eosinophils %: 0.7 % (ref 0.0–7.0)
Eosinophils Absolute: 0 10*3/uL (ref 0.0–0.8)
Hematocrit: 33.3 % — ABNORMAL LOW (ref 36.0–48.0)
Hemoglobin: 11.8 gm/dL — ABNORMAL LOW (ref 12.0–16.0)
Lymphocytes Absolute: 0.6 10*3/uL (ref 0.6–5.1)
Lymphocytes: 13.4 % — ABNORMAL LOW (ref 15.0–46.0)
MCH: 34 pg (ref 28–35)
MCHC: 35 gm/dL (ref 32–36)
MCV: 96 fL (ref 80–100)
MPV: 7.9 fL (ref 6.0–10.0)
Monocytes Absolute: 0.5 10*3/uL (ref 0.1–1.7)
Monocytes: 11.3 % (ref 3.0–15.0)
Neutrophils %: 73.9 % (ref 42.0–78.0)
Neutrophils Absolute: 3.4 10*3/uL (ref 1.7–8.6)
PLT CT: 138 10*3/uL (ref 130–440)
RBC: 3.45 10*6/uL — ABNORMAL LOW (ref 3.80–5.00)
RDW: 17.2 % — ABNORMAL HIGH (ref 11.0–14.0)
WBC: 4.6 10*3/uL (ref 4.0–11.0)

## 2017-11-22 LAB — VH URINALYSIS WITH MICROSCOPIC AND CULTURE IF INDICATED
Bilirubin, UA: NEGATIVE
Glucose, UA: NEGATIVE mg/dL
Ketones UA: 20 mg/dL — AB
Leukocyte Esterase, UA: NEGATIVE Leu/uL
Nitrite, UA: NEGATIVE
Protein, UR: 30 mg/dL — AB
RBC, UA: 24 /hpf — ABNORMAL HIGH (ref 0–5)
Urine Specific Gravity: 1.015 (ref 1.001–1.040)
Urobilinogen, UA: NORMAL mg/dL
WBC, UA: <1 /hpf (ref 0–4)
pH, Urine: 7 pH (ref 5.0–8.0)

## 2017-11-22 LAB — LACTIC ACID, PLASMA: Lactic Acid: 1.12 mMol/L (ref 0.50–2.10)

## 2017-11-22 LAB — COMPREHENSIVE METABOLIC PANEL
ALT: 16 U/L (ref 0–55)
AST (SGOT): 22 U/L (ref 10–42)
Albumin/Globulin Ratio: 1.18 Ratio (ref 0.70–1.50)
Albumin: 4 gm/dL (ref 3.5–5.0)
Alkaline Phosphatase: 68 U/L (ref 40–145)
Anion Gap: 15 mMol/L (ref 7.0–18.0)
BUN / Creatinine Ratio: 14.3 Ratio (ref 10.0–30.0)
BUN: 17 mg/dL (ref 7–22)
Bilirubin, Total: 1.3 mg/dL — ABNORMAL HIGH (ref 0.1–1.2)
CO2: 25.9 mMol/L (ref 20.0–30.0)
Calcium: 10 mg/dL (ref 8.5–10.5)
Chloride: 101 mMol/L (ref 98–110)
Creatinine: 1.19 mg/dL (ref 0.60–1.20)
EGFR: 39 mL/min/{1.73_m2} — ABNORMAL LOW (ref 60–150)
Globulin: 3.4 gm/dL (ref 2.0–4.0)
Glucose: 108 mg/dL — ABNORMAL HIGH (ref 71–99)
Osmolality Calc: 278 mOsm/kg (ref 275–300)
Potassium: 3.9 mMol/L (ref 3.5–5.3)
Protein, Total: 7.4 gm/dL (ref 6.0–8.3)
Sodium: 138 mMol/L (ref 136–147)

## 2017-11-22 LAB — B-TYPE NATRIURETIC PEPTIDE: B-Natriuretic Peptide: 146.6 pg/mL — ABNORMAL HIGH (ref 0.0–100.0)

## 2017-11-22 LAB — ECG 12-LEAD
P Wave Axis: 42 deg
P-R Interval: 148 ms
Patient Age: 89 years
Q-T Interval(Corrected): 419 ms
Q-T Interval: 321 ms
QRS Axis: 21 deg
QRS Duration: 86 ms
T Axis: 37 years
Ventricular Rate: 102 //min

## 2017-11-22 LAB — TROPONIN I: Troponin I: 0.01 ng/mL (ref 0.00–0.02)

## 2017-11-22 LAB — VH INFLUENZA A/B RAPID TEST
Influenza A: NEGATIVE
Influenza B: NEGATIVE

## 2017-11-22 LAB — PT AND APTT
PT INR: 1.1 (ref 0.5–1.3)
PT: 10.7 s (ref 9.5–11.5)
aPTT: 27.9 s (ref 24.0–34.0)

## 2017-11-22 LAB — LIPASE: Lipase: 10 U/L (ref 8–78)

## 2017-11-22 MED ORDER — ACETAMINOPHEN 325 MG PO TABS
650.00 mg | ORAL_TABLET | ORAL | Status: DC | PRN
Start: 2017-11-22 — End: 2017-11-27
  Administered 2017-11-25: 650 mg via ORAL
  Filled 2017-11-22 (×2): qty 2

## 2017-11-22 MED ORDER — PREDNISONE 20 MG PO TABS
40.00 mg | ORAL_TABLET | Freq: Every morning | ORAL | Status: DC
Start: 2017-11-22 — End: 2017-11-23
  Administered 2017-11-22: 18:00:00 40 mg via ORAL
  Filled 2017-11-22 (×2): qty 2

## 2017-11-22 MED ORDER — ACETAMINOPHEN 500 MG PO TABS
1000.0000 mg | ORAL_TABLET | Freq: Once | ORAL | Status: AC
Start: 2017-11-22 — End: 2017-11-22
  Administered 2017-11-22: 14:00:00 1000 mg via ORAL

## 2017-11-22 MED ORDER — FUROSEMIDE 40 MG PO TABS
40.00 mg | ORAL_TABLET | ORAL | Status: DC
Start: 2017-11-23 — End: 2017-11-24
  Administered 2017-11-23 – 2017-11-24 (×2): 40 mg via ORAL
  Filled 2017-11-22 (×2): qty 1

## 2017-11-22 MED ORDER — PRAVASTATIN SODIUM 40 MG PO TABS
40.00 mg | ORAL_TABLET | Freq: Every morning | ORAL | Status: DC
Start: 2017-11-22 — End: 2017-11-27
  Administered 2017-11-22 – 2017-11-27 (×6): 40 mg via ORAL
  Filled 2017-11-22 (×6): qty 1

## 2017-11-22 MED ORDER — VH BIO-K PLUS PROBIOTIC 50 BIL CFU CAPSULE
50.00 | DELAYED_RELEASE_CAPSULE | Freq: Every day | ORAL | Status: DC
Start: 2017-11-22 — End: 2017-11-27
  Administered 2017-11-22 – 2017-11-27 (×6): 50 via ORAL
  Filled 2017-11-22 (×6): qty 1

## 2017-11-22 MED ORDER — AZITHROMYCIN 250 MG PO TABS
250.00 mg | ORAL_TABLET | Freq: Every day | ORAL | Status: DC
Start: 2017-11-23 — End: 2017-11-22

## 2017-11-22 MED ORDER — NALOXONE HCL 0.4 MG/ML IJ SOLN (WRAP)
0.40 mg | INTRAMUSCULAR | Status: DC | PRN
Start: 2017-11-22 — End: 2017-11-27

## 2017-11-22 MED ORDER — ACETAMINOPHEN 650 MG RE SUPP
650.00 mg | RECTAL | Status: DC | PRN
Start: 2017-11-22 — End: 2017-11-27

## 2017-11-22 MED ORDER — CALCIUM CARBONATE-VITAMIN D 500-200 MG-UNIT PO TABS
2.00 | ORAL_TABLET | Freq: Every day | ORAL | Status: DC
Start: 2017-11-23 — End: 2017-11-27
  Administered 2017-11-23 – 2017-11-27 (×5): 2 via ORAL
  Filled 2017-11-22 (×5): qty 2

## 2017-11-22 MED ORDER — CEFTRIAXONE SODIUM 1 G IJ SOLR
INTRAMUSCULAR | Status: AC
Start: 2017-11-22 — End: ?
  Filled 2017-11-22: qty 1000

## 2017-11-22 MED ORDER — STERILE WATER FOR INJECTION IJ SOLN
1.00 g | INTRAMUSCULAR | Status: DC
Start: 2017-11-23 — End: 2017-11-27
  Administered 2017-11-23 – 2017-11-27 (×5): 1 g via INTRAVENOUS
  Filled 2017-11-22: qty 1000
  Filled 2017-11-22 (×2): qty 10
  Filled 2017-11-22 (×3): qty 1000
  Filled 2017-11-22 (×3): qty 10
  Filled 2017-11-22: qty 1000

## 2017-11-22 MED ORDER — AZITHROMYCIN 250 MG PO TABS
500.00 mg | ORAL_TABLET | Freq: Once | ORAL | Status: AC
Start: 2017-11-22 — End: 2017-11-22
  Administered 2017-11-22: 14:00:00 500 mg via ORAL

## 2017-11-22 MED ORDER — STERILE WATER FOR INJECTION IJ SOLN
INTRAMUSCULAR | Status: AC
Start: 2017-11-22 — End: ?
  Filled 2017-11-22: qty 10

## 2017-11-22 MED ORDER — ASPIRIN 81 MG PO TBEC
81.00 mg | DELAYED_RELEASE_TABLET | Freq: Every morning | ORAL | Status: DC
Start: 2017-11-22 — End: 2017-11-24
  Administered 2017-11-22 – 2017-11-24 (×3): 81 mg via ORAL
  Filled 2017-11-22 (×3): qty 1

## 2017-11-22 MED ORDER — DOCUSATE SODIUM 100 MG PO CAPS
100.00 mg | ORAL_CAPSULE | Freq: Every day | ORAL | Status: DC
Start: 2017-11-23 — End: 2017-11-27
  Administered 2017-11-23 – 2017-11-26 (×4): 100 mg via ORAL
  Filled 2017-11-22 (×5): qty 1

## 2017-11-22 MED ORDER — AZITHROMYCIN 250 MG PO TABS
ORAL_TABLET | ORAL | Status: AC
Start: 2017-11-22 — End: ?
  Filled 2017-11-22: qty 2

## 2017-11-22 MED ORDER — ACETAMINOPHEN 325 MG PO TABS
650.0000 mg | ORAL_TABLET | ORAL | Status: DC | PRN
Start: 2017-11-22 — End: 2017-11-27
  Administered 2017-11-24: 650 mg via ORAL

## 2017-11-22 MED ORDER — SENNOSIDES-DOCUSATE SODIUM 8.6-50 MG PO TABS
2.00 | ORAL_TABLET | Freq: Every evening | ORAL | Status: DC
Start: 2017-11-22 — End: 2017-11-27
  Filled 2017-11-22 (×6): qty 2

## 2017-11-22 MED ORDER — SODIUM CHLORIDE 0.9 % IJ SOLN
3.00 mL | Freq: Three times a day (TID) | INTRAMUSCULAR | Status: DC
Start: 2017-11-22 — End: 2017-11-27
  Administered 2017-11-22 – 2017-11-27 (×13): 3 mL via INTRAVENOUS

## 2017-11-22 MED ORDER — AZITHROMYCIN 250 MG PO TABS
500.00 mg | ORAL_TABLET | Freq: Once | ORAL | Status: AC
Start: 2017-11-22 — End: 2017-11-22
  Administered 2017-11-22: 18:00:00 500 mg via ORAL
  Filled 2017-11-22: qty 2

## 2017-11-22 MED ORDER — ACETAMINOPHEN 500 MG PO TABS
1000.00 mg | ORAL_TABLET | ORAL | Status: DC | PRN
Start: 2017-11-22 — End: 2017-11-22

## 2017-11-22 MED ORDER — STERILE WATER FOR INJECTION IJ SOLN
1.00 g | Freq: Once | INTRAMUSCULAR | Status: AC
Start: 2017-11-22 — End: 2017-11-22
  Administered 2017-11-22: 14:00:00 1 g via INTRAVENOUS

## 2017-11-22 MED ORDER — DORZOLAMIDE HCL 2 % OP SOLN
1.00 [drp] | Freq: Three times a day (TID) | OPHTHALMIC | Status: DC
Start: 2017-11-22 — End: 2017-11-27
  Administered 2017-11-22 – 2017-11-27 (×14): 1 [drp] via OPHTHALMIC
  Filled 2017-11-22: qty 200

## 2017-11-22 MED ORDER — ACETAMINOPHEN 160 MG/5ML PO SOLN
650.00 mg | ORAL | Status: DC | PRN
Start: 2017-11-22 — End: 2017-11-27

## 2017-11-22 MED ORDER — VANCOMYCIN HCL IN DEXTROSE 1-5 GM/200ML-% IV SOLN
INTRAVENOUS | Status: AC
Start: 2017-11-22 — End: ?
  Filled 2017-11-22: qty 1000

## 2017-11-22 MED ORDER — ACETAMINOPHEN 500 MG PO TABS
ORAL_TABLET | ORAL | Status: AC
Start: 2017-11-22 — End: ?
  Filled 2017-11-22: qty 2

## 2017-11-22 MED ORDER — VANCOMYCIN HCL IN DEXTROSE 1-5 GM/200ML-% IV SOLN
1000.00 mg | Freq: Once | INTRAVENOUS | Status: AC
Start: 2017-11-22 — End: 2017-11-22
  Administered 2017-11-22: 15:00:00 1000 mg via INTRAVENOUS

## 2017-11-22 MED ORDER — VH ALBUTEROL (2.5MG/3ML) CONTINUOUS NEB
2.50 mg/h | INHALATION_SOLUTION | Freq: Four times a day (QID) | RESPIRATORY_TRACT | Status: DC
Start: 2017-11-22 — End: 2017-11-22
  Filled 2017-11-22: qty 3

## 2017-11-22 MED ORDER — METOPROLOL SUCCINATE ER 50 MG PO TB24
50.00 mg | ORAL_TABLET | Freq: Every morning | ORAL | Status: DC
Start: 2017-11-22 — End: 2017-11-27
  Administered 2017-11-22 – 2017-11-27 (×5): 50 mg via ORAL
  Filled 2017-11-22 (×6): qty 1

## 2017-11-22 MED ORDER — ENOXAPARIN SODIUM 30 MG/0.3ML SC SOLN
30.00 mg | SUBCUTANEOUS | Status: DC
Start: 2017-11-22 — End: 2017-11-24
  Administered 2017-11-22 – 2017-11-23 (×2): 30 mg via SUBCUTANEOUS
  Filled 2017-11-22 (×3): qty 0.3

## 2017-11-22 MED ORDER — VH POTASSIUM CHLORIDE CRYS ER 20 MEQ PO TBCR (WRAP)
20.00 meq | EXTENDED_RELEASE_TABLET | Freq: Every morning | ORAL | Status: DC
Start: 2017-11-22 — End: 2017-11-27
  Administered 2017-11-22 – 2017-11-27 (×6): 20 meq via ORAL
  Filled 2017-11-22 (×6): qty 1

## 2017-11-22 MED ORDER — VH ALBUTEROL (2.5MG/3ML) CONTINUOUS NEB
2.50 mg | INHALATION_SOLUTION | Freq: Four times a day (QID) | RESPIRATORY_TRACT | Status: DC
Start: 2017-11-22 — End: 2017-11-23

## 2017-11-22 MED ORDER — VITAMIN D 1000 UNITS PO TABS
1000.00 [IU] | ORAL_TABLET | Freq: Every morning | ORAL | Status: DC
Start: 2017-11-23 — End: 2017-11-27
  Administered 2017-11-23 – 2017-11-27 (×5): 1000 [IU] via ORAL
  Filled 2017-11-22 (×5): qty 1

## 2017-11-22 NOTE — ED Notes (Signed)
Pt skin cleansed with 4 chlorohexidine for 30 sec each to left ac, allowing drying in between each and prior to sticking. Blood culture bottles cleansed with alcohol pads for 30 seconds and allowed to dry. Pt then stuck with a 23 gauge blood culture collection set, 10 mL obtained in both blood culture bottles, labeled with pt stickers and sent to lab. Contamination may be noted due to pt breathing and coughing on site.

## 2017-11-22 NOTE — ED Provider Notes (Signed)
[facility]  EMERGENCY DEPARTMENT  History and Physical Exam       Patient Name: Kelli Brown  Encounter Date:  11/22/2017  Treating Provider: Arlice Colt, PA-C  Supervising Physician: Kelli Redbird, MD   PCP: Kelli Dibble, MD  Patient DOB:  1919-08-13  MRN:  [id number]  Room:  506/506-A      History of Presenting Illness     Chief complaint: generalized weakness    HPI/ROS is limited by: none  HPI/ROS given by: patient and EMS    Location: generalized  Duration: 2 days  Severity: moderate        Kelli Brown is a 83 y.o. female with history of HTN, HLD, CHF (9/18 echo normal EF, mod aortic stenosis and diastolic dysfunction, pulmonary hypertension), who presents with generalized weakness for the past 2 days.  States she feels generally ill and anxious.  Admits to myalgias, but denies any other pain, no dysuria or frequency. She has a slight cough and decreased appetite.  No n/v/d.  No known sick contacts.  She lives in assisted living.         Review of Systems       Review of Systems   Constitutional: Positive for chills, fever and malaise/fatigue.   HENT: Negative.    Eyes: Negative.  Negative for blurred vision.   Respiratory: Positive for cough. Negative for shortness of breath.    Cardiovascular: Negative.  Negative for chest pain.   Gastrointestinal: Negative.  Negative for abdominal pain, blood in stool, diarrhea, nausea and vomiting.   Genitourinary: Negative.    Musculoskeletal: Negative.  Negative for joint pain and myalgias.   Skin: Negative.  Negative for rash.   Neurological: Negative.  Negative for dizziness, sensory change, focal weakness and headaches.   Endo/Heme/Allergies: Negative.    Psychiatric/Behavioral: Negative.    All other systems reviewed and are negative.      Review of systems is otherwise negative except as noted above.     Allergies & Medications     Pt is allergic to codeine; darvon [propoxyphene]; and phenobarbital.    Current Discharge Medication  List      CONTINUE these medications which have NOT CHANGED    Details   acetaminophen (TYLENOL) 500 MG tablet Take 1,000 mg by mouth as needed for Pain.      aspirin EC 81 MG EC tablet Take 81 mg by mouth every morning.          Calcium Carbonate-Vitamin D (CALTRATE 600+D PO) Take 1 tablet by mouth every morning.      dorzolamide (TRUSOPT) 2 % ophthalmic solution Place 1 drop into the left eye 3 (three) times daily.         furosemide (LASIX) 40 MG tablet Take 40 mg by mouth Once each morning except Saturday and Sunday.Take daily Monday through Friday ONLY          metoprolol XL (TOPROL-XL) 50 MG 24 hr tablet Take 50 mg by mouth every morning.          Mirabegron ER 25 MG Tablet SR 24 hr Take 1 tablet by mouth every morning.          Multiple Vitamins-Minerals (CENTRUM SILVER 50+WOMEN PO) Take 1 tablet by mouth every morning.      potassium chloride 20 MEQ Tab CR Take 20 mEq by mouth every morning.      pravastatin (PRAVACHOL) 40 MG tablet Take 40 mg by mouth every morning.  vitamin D (CHOLECALCIFEROL) 1000 UNIT tablet Take 1,000 Units by mouth every morning.                  Past Medical History     Past Medical History:   Diagnosis Date   . Abnormal vision    . Arthritis    . Congestive heart failure    . Hip fx, right, closed, initial encounter 2010   . Hyperlipidemia    . Hypertension    . Low back pain    . Macular degeneration    . Nonrheumatic aortic (valve) stenosis 08/11/2017   . Shingles         Past Surgical History     Past Surgical History:   Procedure Laterality Date   . APPENDECTOMY     . EYE SURGERY      cateracts   . HIP SURGERY Bilateral     Tubes tied   . TONSILLECTOMY     . TUBAL LIGATION          Family History     Family History   Problem Relation Age of Onset   . Stroke Mother    . Hypertension Mother    . Coronary artery disease Mother    . Cancer Father    . Stroke Sister    . CABG Brother           Social History     Social History     Social History   . Marital status: Widowed      Spouse name: N/A   . Number of children: N/A   . Years of education: N/A     Social History Main Topics   . Smoking status: Never Smoker   . Smokeless tobacco: Never Used   . Alcohol use No   . Drug use: No   . Sexual activity: Not Currently     Birth control/ protection: Post-menopausal     Other Topics Concern   . None     Social History Narrative   . None          Physical Exam     Blood pressure 110/52, pulse 84, temperature 97.5 F (36.4 C), temperature source Oral, resp. rate 20, height 1.524 m, weight 67.9 kg, SpO2 90 %.    Constitutional: WD/WN, active, NAD.  Eyes: PERRL. EOMI.  No conjunctivitis or discharge.  Visual acuity grossly intact.    HEENT:  NCAT. Ears: No external lesions.   Nose: No external lesions or discharge.    O/p dry  Neck: Trachea midline. No JVD. Normal ROM. No apparent masses.  Respiratory: Effort normal without accessory muscle use. Lungs clear to auscultation throughout.   Cardiovascular: RRR, 4/6 SEM RUSB.  No JVD or significant peripheral edema.   Abdomen:  Soft, non-tender, non-distended. No palpable masses or hernias. No hepatosplenomegaly.  Genitourinary:  No CVAT or suprapubic TTP.  Musculoskeletal: Normal range of motion. No deformity or apparent injury.   Neurological: Pt is alert. CN II-XII grossly intact. Moving all extremities without apparent deficit.   Psychiatric: Affect is appropriate. There is no agitation.   Skin: Skin is warm and dry.  No cyanosis, jaundice or pallor. No rash or lesions noted.         Diagnostic Results     The results of the diagnostic studies below have been reviewed by myself:    Labs  Results     Procedure Component Value Units Date/Time    Blood Culture -  Venipuncture #1 [[id number]] Collected:  11/22/17 1257    Specimen:  Blood from Venipuncture Updated:  11/27/17 1341    Narrative:       Specimen/Source: Blood/Venipuncture  Collected: 11/22/2017 12:57     Status: Final      Last Updated: 11/27/2017 13:39                Culture Result (Final)       No Growth in 5 Days          CBC [[id number]]  (Abnormal) Collected:  11/27/17 1147    Specimen:  Blood from Blood Updated:  11/27/17 1207     WBC 6.9 K/cmm      RBC 3.31 (L) M/cmm      Hemoglobin 11.0 (L) gm/dL      Hematocrit 74.2 (L) %      MCV 96 fL      MCH 33 pg      MCHC 35 gm/dL      RDW 59.5 (H) %      PLT CT 163 K/cmm      MPV 7.9 fL             Radiologic Studies    Xr Chest 2 Views    Result Date: 11/22/2017  Lung findings suggest chronic interstitial changes. Superimposed mild asymmetrical right pulmonary edema is difficult to exclude. COPD. Questionable Minimal bibasilar pleural effusion/thickening ReadingStation:[id number]                 ED Course and Medical Decision Making     ED Medication Orders     Start Ordered     Status Ordering Provider    11/22/17 1534 11/22/17 1535  acetaminophen (TYLENOL) tablet 650 mg  Every 4 hours PRN     Route: Oral  Ordered Dose: 650 mg     Last MAR action:  Given RABOFF, WILLIAM K    11/22/17 1422 11/22/17 1421  azithromycin (ZITHROMAX) tablet 500 mg  Once in ED     Route: Oral  Ordered Dose: 500 mg     Last MAR action:  Given RABOFF, WILLIAM K    11/22/17 1422 11/22/17 1421  vancomycin (VANCOCIN) 1 g/200 mL dextrose IVPB (premix)  Once in ED     Route: Intravenous  Ordered Dose: 1,000 mg     Last MAR action:  New Bag RABOFF, Lum Keas    11/22/17 1405 11/22/17 1404  cefTRIAXone (ROCEPHIN) 1 g in sterile water (preservative free) 10 mL Injection  Once in ED     Route: Intravenous  Ordered Dose: 1 g     Last MAR action:  Given Tanya Crothers L    11/22/17 1316 11/22/17 1315  acetaminophen (TYLENOL) tablet 1,000 mg  Once in ED     Route: Oral  Ordered Dose: 1,000 mg     Last MAR action:  Given RABOFF, WILLIAM K               EKG:  EKG Results     Procedure Component Value Units Date/Time    ECG 12 lead [[id number]] Collected:  11/24/17 0820     Updated:  11/25/17 1914     Patient Age 83 years      Patient DOB Apr 03, 1919     Patient Height --     Patient Weight --      Interpretation Text --     Atrial fibrillation  ST depression, probably rate related  Compared to ECG 11/22/2017 12:14:21  Sinus tachycardia no longer present    Electronically Signed On 11-25-2017 19:14:24 EST by Jarrett Soho       Physician Interpreter [facility]     Ventricular Rate 133 //min      QRS Duration 79 ms      P-R Interval -- ms      Q-T Interval 298 ms      Q-T Interval(Corrected) 444 ms      P Wave Axis -- deg      QRS Axis 15 deg      T Axis 61 years           CONSULT  Discussed with Dr. Nolon Bussing, medicine                  MDM    The patient's presentation is suggestive of bronchitis/pneumonia. Evaluation and treatment for this patient has been initiated in the ER, but the patient has not had significant improvement in symptoms, appears ill enough and/or has illness/findings/co-morbidities and hypoxemia that make admission for IV medications, oxygen treatment, and further management the most appropriate disposition.  The differential has included but is not limited to pneumonia, bronchitis/COPD, asthma, cardiac pathology, CHF, cancer, pneumothorax.  Diagnostic impression and plan were discussed and agreed upon with the patient and/or family.  Results of lab/radiology tests were reviewed and discussed with the patient and/or family. All questions were answered and concerns addressed.  Appropriate consultation was made for admission and further treatment of this patient.            Prior medical records and nursing/triage notes were reviewed by myself.  Pertinent imaging independently reviewed.          Procedures / Critical Care          Diagnosis / Disposition     Clinical Impression  1. Pneumonia of left lower lobe due to infectious organism    2. Hypoxia    3. Febrile illness    4. Generalized weakness        Disposition  ED Disposition     ED Disposition Condition Date/Time Comment    Admit  Mon Nov 22, 2017  4:14 PM Admitting Physician: Astrid Drafts [[id number]]   Diagnosis:  Pneumonia [227785]   Estimated Length of Stay: > or = to 2 midnights   Tentative Discharge Plan?: Home or Self Care [1]   Patient Class: Inpatient [101]              Follow up for Discharged Patients  No follow-up provider specified.    Prescriptions for Discharged Patients  Current Discharge Medication List               In addition to the above history, please see nursing notes. Allergies, meds, past medical, family, social hx, and the results of the diagnostic studies performed have been reviewed by myself.  This chart was generated by an EMR using voice recognition software and may contain errors or omissions not intended by the user.     Lynnae Prude, [place]  11/27/17 1348       Kelli Redbird, MD  11/27/17 1714

## 2017-11-22 NOTE — Plan of Care (Signed)
Pt asking about glasses. pts belonging checked. No glasses found. Nursing called back to the er. Stated that there was a person with the pt and she didn't know if they took her glasses or not. Called [facility] and left message to see if glasses were at the facility. No call back at this time.

## 2017-11-22 NOTE — ED Notes (Signed)
Bed: S28-A  Expected date:   Expected time:   Means of arrival:   Comments:  EMS

## 2017-11-22 NOTE — Plan of Care (Addendum)
NURSE NOTE SUMMARY     Patient Name: Kelli Brown   Attending Physician: Astrid Drafts, MD   Primary Care Physician: Flossie Dibble, MD   Date of Admission:   11/22/2017   Today's date:   11/22/2017 LOS: 0 days   Shift Summary:                                                              Assumed care of patient at 1900. Patient alert and oriented X4, but a little confused at times. Patient shows no signs or symptoms of distress. Assessment complete. Vitals noted. Medications administered according to [facility]. Bed in lowest position. Call light within reach. Bed alarm on. Patient denies any pain or needs at this time. Will continue to monitor.     1610- Patient slept most of the night. Patient shows no signs or symptoms of distress. The patient is laying in bed. The patient denies any pain or needs at this time. Will continue to monitor.    Provider Notifications:      Rapid Response Notifications:  Mobility:        PMP Activity: Step 5 - Chair (11/22/2017  7:29 PM)     Weight tracking:  Family Dynamic:     Last 3 Weights for the past 72 hrs (Last 3 readings):   Weight   11/22/17 1753 67.9 kg (149 lb 9.6 oz)   11/22/17 1218 71 kg (156 lb 8.4 oz)       Recent Vitals:  Active Problems:     BP 120/60   Pulse 86   Temp 98.4 F (36.9 C) (Oral)   Resp 20   Ht 1.524 m (5')   Wt 67.9 kg (149 lb 9.6 oz)   SpO2 98%   BMI 29.22 kg/m          Active Problems:    Pneumonia                   Problem: Moderate/High Fall Risk Score >5  Goal: Patient will remain free of falls  Outcome: Progressing      Problem: Compromised Hemodynamic Status  Goal: Vital signs and fluid balance maintained/improved  Outcome: Progressing   11/22/17 1804   Goal/Interventions addressed this shift   Vital signs and fluid balance are maintained/improved Position patient for maximum circulation/cardiac output;Monitor/assess vitals and hemodynamic parameters with position changes;Monitor and compare daily weight;Monitor intake and output.  Notify LIP if urine output is less than 30 mL/hour.;Monitor/assess lab values and report abnormal values

## 2017-11-22 NOTE — ED Notes (Signed)
Pt taken to CDU via stretcher by this RN with belongings and paper work at this time, Kenney Houseman, Receiving RN aware.

## 2017-11-22 NOTE — H&P (Signed)
HISTORY AND PHYSICAL - [facility]    Date Time: 11/22/17 4:53 PM  Patient Name: Kelli Brown  Attending Physician: Bernette Redbird, MD  Primary Care Physician: Flossie Dibble, MD    CC:   Chief Complaint   Patient presents with   . Generalized weakness       Assessment and Plan                                                          [facility]     Active Problems:    Pneumonia    PNEUMONIA - fever , malaise , cough , left side opacity on cxr   Chronic interstitial  lung disease   Admit to medicla floor   Monitor vitals   Empiric abx ceftraixone and azithro   Influenza negative   Appears to have bronchospasm with cough -  Will add prednisone   Albuterol /atvent nebs prn       chf- appears well compensated -  Cont home dose of lasix     Aortic valve stenosis -   Not interested in any procedure     htn-  Cont metoprolol     Hyperlipidemia - cont statins     Poor vision /macular degeneration -   Pt was worried as she lost her glasses in the ER , notified nurse to  Help find it      Code -  Pt wants to be DNR ,   Supportive care     dvt px lovenox       History of Presenting Illness and ROS:                               [facility]        Kelli Brown is a 83 y.o. female with PMHx of chf ,  ild , pneumonia in past   who lives in [place]  Presented with  Malaise , fever, cough  And loss of appetite for last 2 to 3 days .   In ER was febrile with 102,  bp was good ,  Lactic acid ws normal .   cxr  With left side hazineess likely pneumonia .   Pt denied any expectoration , has been in bed fro 2 days , unable to eat as she was sick , denies any vomiting , abdo , pain , diarrhea , no chest pain or palpaitions .           Past Medical History:                                                            [facility]       Past Medical History:   Diagnosis Date   . Abnormal vision    . Arthritis    . Congestive heart failure    . Hip fx, right, closed, initial encounter 2010   .  Hyperlipidemia    . Hypertension    . Low back pain    . Macular degeneration    .  Nonrheumatic aortic (valve) stenosis 08/11/2017   . Shingles        Past Surgical History:                                                            [facility]       Past Surgical History:   Procedure Laterality Date   . APPENDECTOMY     . EYE SURGERY      cateracts   . HIP SURGERY Bilateral     Tubes tied   . TONSILLECTOMY     . TUBAL LIGATION         Family History:                                                                         [facility]       Family History   Problem Relation Age of Onset   . Stroke Mother    . Hypertension Mother    . Coronary artery disease Mother    . Cancer Father    . Stroke Sister    . CABG Brother        Social History:                                                                         [facility]       History   Smoking Status   . Never Smoker   Smokeless Tobacco   . Never Used     History   Alcohol Use No     History   Drug Use No       Allergies:                                                                                   [facility]       Allergies   Allergen Reactions   . Codeine Nausea And Vomiting and Other (See Comments)     LIGHTHEADEDNESS.   . Darvon [Propoxyphene] Nausea And Vomiting and Other (See Comments)     LIGHTHEADEDNESS.   Marland Kitchen Phenobarbital Swelling and Rash       Medications:  [facility]       Current/Home Medications    ACETAMINOPHEN (TYLENOL) 500 MG TABLET    Take 1,000 mg by mouth as needed for Pain.    ASPIRIN EC 81 MG EC TABLET    Take 81 mg by mouth every morning.        CALCIUM CARBONATE-VITAMIN D (CALTRATE 600+D PO)    Take 1 tablet by mouth every morning.    DORZOLAMIDE (TRUSOPT) 2 % OPHTHALMIC SOLUTION    Place 1 drop into the left eye 3 (three) times daily.       FUROSEMIDE (LASIX) 40 MG TABLET    Take 40 mg by mouth Once each morning except  Saturday and Sunday.Take daily Monday through Friday ONLY        METOPROLOL XL (TOPROL-XL) 50 MG 24 HR TABLET    Take 50 mg by mouth every morning.        MIRABEGRON ER 25 MG TABLET SR 24 HR    Take 1 tablet by mouth every morning.        MULTIPLE VITAMINS-MINERALS (CENTRUM SILVER 50+WOMEN PO)    Take 1 tablet by mouth every morning.    POTASSIUM CHLORIDE 20 MEQ TAB CR    Take 20 mEq by mouth every morning.    PRAVASTATIN (PRAVACHOL) 40 MG TABLET    Take 40 mg by mouth every morning.        VITAMIN D (CHOLECALCIFEROL) 1000 UNIT TABLET    Take 1,000 Units by mouth every morning.            Review Of Systems:                                                               [facility]       Review of Systems   Constitutional: Positive for chills, fever and malaise/fatigue.   HENT: Positive for congestion.    Eyes: Positive for blurred vision.   Respiratory: Positive for cough, shortness of breath and wheezing.    Cardiovascular: Positive for chest pain, leg swelling and PND. Negative for palpitations, orthopnea and claudication.   Gastrointestinal: Positive for nausea. Negative for blood in stool, constipation, diarrhea and melena.   Genitourinary: Negative.    Musculoskeletal: Positive for back pain.   Skin: Negative.    Neurological: Positive for dizziness and weakness. Negative for tingling, tremors, sensory change, speech change, focal weakness, seizures, loss of consciousness and headaches.   Psychiatric/Behavioral: Negative.          Physical Exam:                                                                         [facility]       Patient Vitals for the past 24 hrs:   BP Temp Temp src Pulse Resp SpO2 Height Weight   11/22/17 1530 - 99.3 F (37.4 C) Oral 100 14 95 % - -   11/22/17 1428 - (!) 101.8 F (38.8 C) Rectal (!) 107 (!) 23 94 % - -  11/22/17 1425 - - - (!) 104 19 94 % - -   11/22/17 1409 - - - (!) 107 17 (!) 87 % - -   11/22/17 1402 144/65 - - 97 19 - - -   11/22/17 1301 132/56 - -  97 18 95 % - -   11/22/17 1244 - (!) 102 F (38.9 C) Rectal (!) 105 18 92 % - -   11/22/17 1218 157/66 98.9 F (37.2 C) Oral (!) 101 17 95 % 1.6 m (5\' 3" ) 71 kg (156 lb 8.4 oz)     Body mass index is 27.73 kg/m.  No intake or output data in the 24 hours ending 11/22/17 1653    General: awake, alert, oriented x 3; no acute distress.  HEENT: perrla, eomi, sclera anicteric  oropharynx clear without lesions, mucous membranes moist  Neck: supple, no lymphadenopathy, no thyromegaly, no JVD, no carotid bruits  Cardiovascular: regular rate and rhythm, no rubs or gallops, pansystolic murmur  Lungs: scattered wheezing    Abdomen: soft, non-tender, non-distended; no palpable masses, no hepatosplenomegaly, normoactive bowel sounds, no rebound or guarding  Extremities: no clubbing, cyanosis, or edema  Neuro: cranial nerves grossly intact, strength 5/5 in upper and lower extremities, sensation intact,   Psych: Normal affect, not depressed   Skin: no rashes or lesions noted    Labs and Imaging:                                                                   [facility]       Results     Procedure Component Value Units Date/Time    B-type Natriuretic Peptide [[id number]]  (Abnormal) Collected:  11/22/17 1257    Specimen:  Blood Updated:  11/22/17 1352     B-Natriuretic Peptide 146.6 (H) pg/mL     Troponin I (STAT) [[id number]] Collected:  11/22/17 1257    Specimen:  Plasma Updated:  11/22/17 1351     Troponin I 0.01 ng/mL     Comprehensive metabolic panel [[id number]]  (Abnormal) Collected:  11/22/17 1257    Specimen:  Plasma Updated:  11/22/17 1347     Sodium 138 mMol/L      Potassium 3.9 mMol/L      Chloride 101 mMol/L      CO2 25.9 mMol/L      Calcium 10.0 mg/dL      Glucose 962 (H) mg/dL      Creatinine 9.52 mg/dL      BUN 17 mg/dL      Protein, Total 7.4 gm/dL      Albumin 4.0 gm/dL      Alkaline Phosphatase 68 U/L      ALT 16 U/L      AST (SGOT) 22 U/L      Bilirubin, Total 1.3 (H) mg/dL      Albumin/Globulin Ratio  1.18 Ratio      Anion Gap 15.0 mMol/L      BUN/Creatinine Ratio 14.3 Ratio      EGFR 39 (L) mL/min/1.51m2      Osmolality Calc 278 mOsm/kg      Globulin 3.4 gm/dL     Lipase [[id number]] Collected:  11/22/17 1257    Specimen:  Plasma Updated:  11/22/17 1347  Lipase 10 U/L     PT/APTT [[id number]] Collected:  11/22/17 1257    Specimen:  Blood Updated:  11/22/17 1336     PT 10.7 sec      PT INR 1.1     aPTT 27.9 sec     Lactic acid [[id number]] Collected:  11/22/17 1304    Specimen:  Blood Updated:  11/22/17 1336     Lactic acid 1.12 mMol/L     Influenza A / B Rapid Test [[id number]] Collected:  11/22/17 1306    Specimen:  Nasal Wash Updated:  11/22/17 1334     Influenza A Negative     Influenza B Negative    Narrative:       Influenza A antigen detection tests are unable to distinquish between novel and seasonal influenza A.    A negative result for either Influenza A or B antigen does not exclude influenza virus infection. Clinical correlation required.    All positive influenza antigen tests (A or B) require placement of patient on droplet precaution isolation.    CBC and differential [[id number]]  (Abnormal) Collected:  11/22/17 1257    Specimen:  Blood from Blood Updated:  11/22/17 1331     WBC 4.6 K/cmm      RBC 3.45 (L) M/cmm      Hemoglobin 11.8 (L) gm/dL      Hematocrit 25.4 (L) %      MCV 96 fL      MCH 34 pg      MCHC 35 gm/dL      RDW 27.0 (H) %      PLT CT 138 K/cmm      MPV 7.9 fL      NEUTROPHIL % 73.9 %      Lymphocytes 13.4 (L) %      Monocytes 11.3 %      Eosinophils % 0.7 %      Basophils % 0.7 %      Neutrophils Absolute 3.4 K/cmm      Lymphocytes Absolute 0.6 K/cmm      Monocytes Absolute 0.5 K/cmm      Eosinophils Absolute 0.0 K/cmm      BASO Absolute 0.0 K/cmm     Blood Culture - Venipuncture #1 [[id number]] Collected:  11/22/17 1257    Specimen:  Blood from Venipuncture Updated:  11/22/17 1312    Urinalysis w Microscopic and Culture if Indicated [[id number]]  (Abnormal) Collected:  11/22/17 1243     Specimen:  Urine, Random Updated:  11/22/17 1303     Color, UA Yellow     Clarity, UA Slightly Cloudy (A)     Specific Gravity, UR 1.015     pH, Urine 7.0 pH      Protein, UR 30 (A) mg/dL      Glucose, UA Negative mg/dL      Ketones UA 20 (A) mg/dL      Bilirubin, UA Negative     Blood, UA Moderate (A)     Nitrite, UA Negative     Urobilinogen, UA Normal mg/dL      Leukocyte Esterase, UA Negative Leu/uL      UR Micro Performed     WBC, UA <1 /hpf      RBC, UA 24 (H) /hpf      Bacteria, UA Rare (A) /hpf           Imaging: Xr Chest 2 Views    Result Date: 11/22/2017  Lung findings suggest chronic interstitial changes. Superimposed  mild asymmetrical right pulmonary edema is difficult to exclude. COPD. Questionable Minimal bibasilar pleural effusion/thickening ReadingStation:[id number]      Signed by: Astrid Drafts, MD    Total time spent in counseling and/or coordination of care with other physicians, other health care professionals, patient and family is 70 minutes.     [facility]  [address]  [place], [place]    ZO:[id number], Dalbert Garnet, MD

## 2017-11-22 NOTE — ED Notes (Signed)
Report called to Kristen Loader., RN in CDU, plan to go to CDU 33.

## 2017-11-22 NOTE — ED Triage Notes (Signed)
See quick triage

## 2017-11-22 NOTE — ACP (Advance Care Planning) (Signed)
Advance Care Planning Services    Patient Name: Kelli Brown WHITE LOS: 0 days   Attending Physician: Astrid Drafts, MD   Primary Care Physician: Flossie Dibble, MD   Narrative of Meeting   Events prompting this discussion:  Current hospitalization  Pneumonia  Major disease and the trajectory:  Advanced age  Chronic interstitial lung disease    aortic valve stenosis  Macular degeneration  The types of care discussed:  Standard of care for disease, Resuscitation with CPR, Feeding Tubes (temporary or permanent), Intubation/TracheostomyTube, Chemotherapy, Surgery and Limiting future hospitalization     The Patient's/Surrogate's wishes and goals for treatment:  DO NOT RESUSCITATE    Patient says I am almost 83 years old and have left my life. I do not want any artificial means to prolong my life and I don't want to have chest compressions.    Active Problem List  Active Problems:    Pneumonia        Documents Filled Out As Part of Discussion:                None  Existing Documents that were reviewed and discussed with the patient/surrogate:  None   Acknowledgment of patient's right to decline advance care planning:  The patient has medical decision-making capacity and was represented by herself.  The patient/surrogate was informed that this session is completely voluntary and was given the opportunity to decline the ACP services. The patient/surrogate decided to participate in the ACP session with the discussion proceeding as described above.   I have spent 18 minutes on face-to-face Advance Care Planning Services   100% of the time was spent on discussion and counseling the patient.   No active management of the problems listed above was undertaken during the time period reported.       Medical decision-making capacity can be determined by patient's ability to have:  1.     Factual understanding of current situation and issues;   2.     Interpretation of the information presented.   3.     Reasoning ability  about decisions/choices.  4.     Execution of voluntary decision without coercion.   Advance care planning:             Improves end of life care            Improves patient & family satisfaction            Reduces stress, anxiety, & depression in surviving relatives  Reference:  Detering et al., BMJ 2010; 340 doi: PopPath.it (Published 13 February 2009)   Astrid Drafts, MD  11/22/17 7:13 PM  MRN: [id number]                                       CSN: [id number]                                             DOB: 10-07-1919

## 2017-11-22 NOTE — Plan of Care (Signed)
Problem: Compromised Hemodynamic Status  Goal: Vital signs and fluid balance maintained/improved  Outcome: Progressing   11/22/17 1804   Goal/Interventions addressed this shift   Vital signs and fluid balance are maintained/improved Position patient for maximum circulation/cardiac output;Monitor/assess vitals and hemodynamic parameters with position changes;Monitor and compare daily weight;Monitor intake and output. Notify LIP if urine output is less than 30 mL/hour.;Monitor/assess lab values and report abnormal values

## 2017-11-22 NOTE — ED Notes (Signed)
Dr. Raboff at bedside at this time.

## 2017-11-22 NOTE — ED Notes (Signed)
ED Attending Physician Note:     Patient Alfredo Batty via EMS from SNF] reports 2 day hx of worsening generalized weakness with associated mild cough, increased difficulty with urination, and decreased appetite. Denies associated fever, earache, sore throat, nausea, vomiting, diarrhea, melena, hematochezia, or dysuria. Denies CP or SOB.    Influenza vaccination UTD.     Hx of pneumonia, last admitted from same from 6/12-6/13. Treated with IV antibiotics during course of hospitalization, and discharged on Omnicef.     Physical examination:     Cardiac- 3/6 systolic ejection murmur, best over the aortic valve.     Pulm- Clear to auscultation.     Balance of physical examination otherwise within normal limits. See PA note for further detailed physical examination.     ED Course/MDM:     Patient reports a three-day history of worsening weakness, occasional cough, now with fever 102. Suspect chest x-ray findings consistent with left lower lobe pneumonia. Flu is negative. Cultures and giving acquired pneumonia antibiotics initiated. Lactate negative, low suspicion for sepsis.    2:19 PM Page out to [facility].     See PA note for further evaluation and management.          Bernette Redbird, MD  11/27/17 1710

## 2017-11-23 LAB — CBC AND DIFFERENTIAL
Basophils %: 0 % (ref 0.0–3.0)
Basophils Absolute: 0 10*3/uL (ref 0.0–0.3)
Eosinophils %: 0.5 % (ref 0.0–7.0)
Eosinophils Absolute: 0 10*3/uL (ref 0.0–0.8)
Hematocrit: 32.4 % — ABNORMAL LOW (ref 36.0–48.0)
Hemoglobin: 10.7 gm/dL — ABNORMAL LOW (ref 12.0–16.0)
Lymphocytes Absolute: 0.6 10*3/uL (ref 0.6–5.1)
Lymphocytes: 17.9 % (ref 15.0–46.0)
MCH: 32 pg (ref 28–35)
MCHC: 33 gm/dL (ref 32–36)
MCV: 97 fL (ref 80–100)
MPV: 7.9 fL (ref 6.0–10.0)
Monocytes Absolute: 0.5 10*3/uL (ref 0.1–1.7)
Monocytes: 13 % (ref 3.0–15.0)
Neutrophils %: 68.6 % (ref 42.0–78.0)
Neutrophils Absolute: 2.5 10*3/uL (ref 1.7–8.6)
PLT CT: 134 10*3/uL (ref 130–440)
RBC: 3.34 10*6/uL — ABNORMAL LOW (ref 3.80–5.00)
RDW: 17.2 % — ABNORMAL HIGH (ref 11.0–14.0)
WBC: 3.6 10*3/uL — ABNORMAL LOW (ref 4.0–11.0)

## 2017-11-23 LAB — COMPREHENSIVE METABOLIC PANEL
ALT: 13 U/L (ref 0–55)
AST (SGOT): 21 U/L (ref 10–42)
Albumin/Globulin Ratio: 1.1 Ratio (ref 0.70–1.50)
Albumin: 3.3 gm/dL — ABNORMAL LOW (ref 3.5–5.0)
Alkaline Phosphatase: 56 U/L (ref 40–145)
Anion Gap: 13.3 mMol/L (ref 7.0–18.0)
BUN / Creatinine Ratio: 19.4 Ratio (ref 10.0–30.0)
BUN: 19 mg/dL (ref 7–22)
Bilirubin, Total: 0.8 mg/dL (ref 0.1–1.2)
CO2: 24.8 mMol/L (ref 20.0–30.0)
Calcium: 9.3 mg/dL (ref 8.5–10.5)
Chloride: 103 mMol/L (ref 98–110)
Creatinine: 0.98 mg/dL (ref 0.60–1.20)
EGFR: 49 mL/min/{1.73_m2} — ABNORMAL LOW (ref 60–150)
Globulin: 3 gm/dL (ref 2.0–4.0)
Glucose: 108 mg/dL — ABNORMAL HIGH (ref 71–99)
Osmolality Calc: 277 mOsm/kg (ref 275–300)
Potassium: 4.1 mMol/L (ref 3.5–5.3)
Protein, Total: 6.3 gm/dL (ref 6.0–8.3)
Sodium: 137 mMol/L (ref 136–147)

## 2017-11-23 LAB — THYROID STIMULATING HORMONE (TSH), REFLEX ON ABNORMAL TO FREE T4, SERUM: TSH: 0.6 u[IU]/mL (ref 0.40–4.20)

## 2017-11-23 MED ORDER — GUAIFENESIN ER 600 MG PO TB12
600.00 mg | ORAL_TABLET | Freq: Two times a day (BID) | ORAL | Status: DC
Start: 2017-11-23 — End: 2017-11-27
  Administered 2017-11-23 – 2017-11-27 (×9): 600 mg via ORAL
  Filled 2017-11-23 (×10): qty 1

## 2017-11-23 MED ORDER — ALBUTEROL SULFATE (2.5 MG/3ML) 0.083% IN NEBU
2.50 mg | INHALATION_SOLUTION | Freq: Four times a day (QID) | RESPIRATORY_TRACT | Status: DC | PRN
Start: 2017-11-23 — End: 2017-11-27

## 2017-11-23 MED ORDER — ALBUTEROL SULFATE (2.5 MG/3ML) 0.083% IN NEBU
2.50 mg | INHALATION_SOLUTION | Freq: Four times a day (QID) | RESPIRATORY_TRACT | Status: DC
Start: 2017-11-23 — End: 2017-11-23
  Administered 2017-11-23: 10:00:00 2.5 mg via RESPIRATORY_TRACT
  Filled 2017-11-23 (×5): qty 3

## 2017-11-23 MED ORDER — GUAIFENESIN 100 MG/5ML PO SOLN
200.00 mg | ORAL | Status: DC | PRN
Start: 2017-11-23 — End: 2017-11-27
  Administered 2017-11-23 – 2017-11-27 (×4): 200 mg via ORAL
  Filled 2017-11-23 (×4): qty 10

## 2017-11-23 MED ORDER — ZOLPIDEM TARTRATE 5 MG PO TABS
2.50 mg | ORAL_TABLET | Freq: Every evening | ORAL | Status: DC | PRN
Start: 2017-11-23 — End: 2017-11-27

## 2017-11-23 NOTE — Plan of Care (Signed)
Problem: Moderate/High Fall Risk Score >5  Goal: Patient will remain free of falls  Outcome: Progressing   11/22/17 1248   OTHER   High (Greater than 13) MOD-Remain with patient during toileting;MOD-Re-orient confused patients;HIGH-Apply yellow "Fall Risk" arm band;HIGH-(VH Only) Yellow slippers;HIGH-(VH Only) Place red fall prevention sign on door/chart

## 2017-11-23 NOTE — Progress Notes (Signed)
PROGRESS NOTE - [facility]    Date Time: 11/23/17 2:56 PM  Patient Name: [facility] WHITE  Attending Physician: Donnie Coffin DO  MRN: [id number]       DOB: [birth year]      Assessment and Plan:                                                                                                 [facility]        Active Problems:    Pneumonia    PNEUMONIA  With [place] po intake  Chronic interstitial  lung disease   Cont ceftraixone and azithro   Influenza negative   Stop prednisone  Albuterol /atvent nebs prn     chf  appears well compensated   Cont home dose of lasix     Aortic valve stenosis  Not interested in any procedure     htn  Cont metoprolol     Hyperlipidemia  cont statins     Poor vision /macular degeneration  Pt was worried as she lost her glasses in the ER , notified nurse to  Help find it          Nutrition: Regular Diet  GI Prophylaxis: not indicated  DVT Prophylaxis: low molecular weight heparin and SCD's while in bed  Code Status: DNR     Medical mangement, rx management               Subjective                                                                                      [facility]     She states she is very tired today. She was unable to sleep. She has not had a fever this morning. She says that she might be somewhat better than yesterday. She denies new complaints of nausea vomiting diarrhea. She is going to try to eat she hasn't eaten in 2 days. She is concerned about ambulating.    Review of Systems:    Constitutional:  Denies fever .    ENT:  Denies sore throat or trouble swallowing.    CV:  Denies chest pain or palpitations.    Respiratory:  Denies SOB or cough.    GI:  Denies nausea, vomiting, diarrhea, constipation or abdominal pain.    GU:  Denies pain or burning with urination.    MS:  Denies new muscle or joint pain.    Skin:  Denies rash.    Heme: Denies any bleeding.    Physical Exam:  [facility]   Patient Vitals for the past 24 hrs:   BP Temp Temp src Pulse Resp SpO2 Height Weight   11/23/17 1103 123/75 - - (!) 110 - - - -   11/23/17 0732 96/40 98.8 F (37.1 C) Oral 83 20 95 % - -   11/22/17 2223 98/41 98.4 F (36.9 C) Oral 82 20 96 % - -   11/22/17 1753 120/60 98.4 F (36.9 C) Oral 86 20 98 % 1.524 m (5') 67.9 kg (149 lb 9.6 oz)   11/22/17 1530 - 99.3 F (37.4 C) Oral 100 14 95 % - -        Intake/Output Summary (Last 24 hours) at 11/23/17 1456  Last data filed at 11/23/17 1429   Gross per 24 hour   Intake              630 ml   Output              100 ml   Net              530 ml     Wt Readings from Last 1 Encounters:   11/22/17 67.9 kg (149 lb 9.6 oz)        Constitutional: Well groomed and in no acute distress.    Psych: Alert and oriented X3. Affect is not depressed/anxious.    Eyes: Conjunctiva is pink without purulent drainage.    ENT: Hearing is grossly normal. Mucosa is moist.    CV: Heart shows regular rate and rhythm. Pedal pulses are 2+. Pedal edema absent.    Lungs: Clear to ascultation. No accessory muscle use.    Abd: No masses. No tenderness. Bowel sounds present. No organomegaly.    MS: she is able to ambulate.     Skin: No rash. No nodules.      Meds:                                                                                              [facility]     Estimated Creatinine Clearance: 28.9 mL/min (based on SCr of 0.98 mg/dL).    Current Facility-Administered Medications   Medication Dose Route Frequency   . aspirin EC  81 mg Oral QAM   . calcium-vitamin D  2 tablet Oral Daily   . cefTRIAXone  1 g Intravenous Q24H   . docusate sodium  100 mg Oral Daily   . dorzolamide  1 drop Left Eye TID   . enoxaparin  30 mg Subcutaneous Q24H   . furosemide  40 mg Oral 5X WEEKLY (AM)   . guaiFENesin  600 mg Oral Q12H SCH   . lactobacillus species  50 Billion CFU Oral Daily   . metoprolol succinate XL  50 mg Oral QAM   . potassium chloride  20 mEq Oral QAM   .  pravastatin  40 mg Oral QAM   . senna-docusate  2 tablet Oral QHS   . sodium chloride (PF)  3 mL Intravenous Q8H   . vitamin D  1,000 Units Oral QAM       PRN  medications: acetaminophen **OR** acetaminophen **OR** acetaminophen, acetaminophen, albuterol, guaiFENesin, naloxone, zolpidem    IV Drips:        Labs and Imaging:                                                                        [facility]     RECENT LABS (from the last 7 days)    Recent Labs  Lab 11/23/17  0753 11/22/17  1257   WBC 3.6* 4.6   Hemoglobin 10.7* 11.8*   Hematocrit 32.4* 33.3*   PLT CT 134 138       Recent Labs  Lab 11/22/17  1257   PT 10.7   PT INR 1.1   aPTT 27.9       Recent Labs  Lab 11/22/17  1257   Troponin I 0.01       Recent Labs  Lab 11/22/17  1257   B-Natriuretic Peptide 146.6*      Recent Labs  Lab 11/23/17  0753 11/22/17  1257   Glucose 108* 108*   Sodium 137 138   Potassium 4.1 3.9   Chloride 103 101   CO2 24.8 25.9   BUN 19 17   Creatinine 0.98 1.19   EGFR 49* 39*   Calcium 9.3 10.0           Recent Labs  Lab 11/23/17  0753 11/22/17  1257   Albumin 3.3* 4.0   Protein, Total 6.3 7.4   Bilirubin, Total 0.8 1.3*   Alkaline Phosphatase 56 68   ALT 13 16   AST (SGOT) 21 22      Recent Labs  Lab 11/22/17  1243   Specific Gravity, UR 1.015   pH, Urine 7.0   Protein, UR 30*   Glucose, UA Negative   Ketones UA 20*   Bilirubin, UA Negative   Blood, UA Moderate*   Nitrite, UA Negative   Urobilinogen, UA Normal   Leukocyte Esterase, UA Negative   WBC, UA <1   RBC, UA 24*   Bacteria, UA Rare*      No results found for: [id number]       Imaging, reviewed and are significant for:  XR Chest 2 Views   Final Result      Lung findings suggest chronic interstitial changes. Superimposed mild asymmetrical right pulmonary edema is difficult to exclude.      COPD.      Questionable Minimal bibasilar pleural effusion/thickening      ReadingStation:[id number]          Microbiology Results     Procedure Component Value Units Date/Time     Blood Culture - Venipuncture #1 [[id number]] Collected:  11/22/17 1257    Specimen:  Blood from Venipuncture Updated:  11/23/17 1106    Narrative:       Specimen/Source: Blood/Venipuncture  Collected: 11/22/2017 12:57     Status: Valued      Last Updated: 11/23/2017 11:04                Culture Result (Prelim)      No Growth To Date          Influenza A / B Rapid Test [[id number]] Collected:  11/22/17 1306  Specimen:  Nasal Wash Updated:  11/22/17 1334     Influenza A Negative     Influenza B Negative     Comment: Method: Jarvis Morgan    The sensitivity for this method is between 90% and 95% for Influenza A and around 90% for Influenza B. The specificity for both Influenza A and B is around 96%. False positive results may occur, especially when the prevalence of Influenza activity is low. This is more likely with Influenza B due to its lower prevalence. Clinical conditions, including the prevalence of influenza activity, should be considered in the interpretation of results. If clinically indicated, results may be confirmed with PCR testing.  The above 2 analytes were performed by [facility] [phone])  [address] [address]         Narrative:       Influenza A antigen detection tests are unable to distinquish between novel and seasonal influenza A.    A negative result for either Influenza A or B antigen does not exclude influenza virus infection. Clinical correlation required.    All positive influenza antigen tests (A or B) require placement of patient on droplet precaution isolation.          Donnie Coffin, DO  [facility], [place]  [address]  [place], [id number]  Pager [phone]        This note was generated within an electronic medical record, and portions of it may have been completed with voice recognition software. Typographical, word substitution, pronoun and other language errors may occur. If there are substantial concerns about the content of this note  that may affect patient care, please contact the author for clarification.

## 2017-11-23 NOTE — PT Eval Note (Signed)
VHS: [facility]  Department of Rehabilitation Services: [phone]  Kelli Brown    CSN: [id number]    GI/ENDO/GEN MED   506/506-A    Physical Therapy Evaluation    Time of treatment:  Time Calculation  PT Received On: 11/23/17  Start Time: 1529  Stop Time: 1547  Time Calculation (min): 18 min    Visit#: 1                                                                                 Precautions and Contraindications:   Falls  Mobility protocol     Clinical Presentation and Decision Making     PT Assessment:  Kelli Brown was admitted 11/22/2017 with malaise, fever, cough, and loss of appetite for the last 2-3 days. Patient noted that at her baseline she was Independent with all ADL's, functional mobility, and was able to Independently ambulate community distances with a front wheeled walker. Patient currently requires Supervision-Moderate Ax1 for all bed mobility, and Supervision for transfers. Patient was also able to ambulate 40ft with the front wheeled walker and Supervision assistance all without LOB. Patient can continue to benefit from skilled acute care PT services in order to address the noted impairments below. PT is recommending that patient can return to [facility] with therapy services in order to help her maximize her functional Independence.     Patient presenting with the following PT Impairments:decreased strength, decreased activity tolerance, decreased functional mobility, decreased balance, gait deficits     Due to the presence of several treatment options and 1-2 comorbidities or personal factors that affect performance, as well as patient's evolving clinical presentation with changing characteristics, minimal to moderate modifications of mobility and/or assistance were necessary to complete evaluation when examining 1-2 elements (includes body structures and functions, activity limitations and/or participation restrictions) determines the degree of  complexity for this patient is LOW    Rehabilitation Potential:Good with ongoing PT upon discharge from acute care.  24 hour supervision recommended.    Discussed risk, benefits and Plan of Care with: Patient, Nursing Staff    DISCHARGE RECOMMENDATIONS   DME recommended for Discharge:   None  Has needed equipment    Discharge Recommendations:   ALF;Other(Comment) (Return to [facility] with therapy services )         Development of Plan of Care:     Goals:      STG's = LTG's by d/c     1. Patient will be Independent with all bed mobility in order to prep for out of bed mobility. (New)  2. Patient will be Modified Independent with all transfers with the front wheeled walker without LOB in order to prep for gait training. (New)  3. Patient will be able to Independently ambulate 241ft with the front wheeled walker without LOB in order to simulate community ambulation. (New)     Treatment/interventions: Gait training, Functional transfer training, LE strengthening/ROM, Patient/caregiver training, Bed mobility    Treatment Frequency: 3-4x/wk    History:   History of Present Illness:    Medical Diagnosis: Hypoxia [R09.02]  Febrile illness [R50.9]  Generalized weakness [R53.1]  Pneumonia of left  lower lobe due to infectious organism [J18.1]    Kelli Brown is a 84 y.o. female admitted on 11/22/2017 with a "PMHx of chf ,  ild , pneumonia in past   who lives in [place]  Presented with  Malaise , fever, cough  And loss of appetite for last 2 to 3 days .   In ER was febrile with 102,  bp was good ,  Lactic acid ws normal .   cxr  With left side hazineess likely pneumonia .   Pt denied any expectoration , has been in bed fro 2 days , unable to eat as she was sick , denies any vomiting , abdo , pain , diarrhea , no chest pain or palpaitions ." (Dr. Nolon Brown, 11/22/17)    Patient Active Problem List   Diagnosis   . Cervical spine fracture   . Ocular hypertension of left eye   . Chronic midline low back pain without  sciatica   . Nonrheumatic aortic (valve) stenosis   . Pneumonia      Past Medical/Surgical History:  Past Medical History:   Diagnosis Date   . Abnormal vision    . Arthritis    . Congestive heart failure    . Hip fx, right, closed, initial encounter 2010   . Hyperlipidemia    . Hypertension    . Low back pain    . Macular degeneration    . Nonrheumatic aortic (valve) stenosis 08/11/2017   . Shingles       Past Surgical History:   Procedure Laterality Date   . APPENDECTOMY     . EYE SURGERY      cateracts   . HIP SURGERY Bilateral     Tubes tied   . TONSILLECTOMY     . TUBAL LIGATION         Social History:    Home Living Arrangements:  Living Arrangements: Facility: [facility]   Assistance Available: Full time , 24 hour supervision, 24 hour assistance  Type of Home: Assisted living   Walk in shower with built in bench, grab bars, raised commode with 1 grab bar     Prior Level of Function:  Community ambulation  Mobility:  Modified independence  with  a rollatow walker inside the facility and front wheeled walker in public   Fall history: None noted within the past 3 months   Independent with all ADL's, laundry/pills, friend drives, goes to [employer] for meals      DME available at home:  Engineer, materials (4 wheeled walker)    Subjective   Patient noted that she was feeling better and was willing to participate with therapy    Patient is agreeable to participation in the therapy session. Nursing clears patient for therapy.    Patient/caregiver goal for PT: Patient noted that she would like to go back to [facility]     Pain:  At Rest: 0/10  With Activity: 0/10  Location: N/A  Interventions: None required    Examination of Body Systems (Structures, Function, Activity and Participation)   Patient's medical condition is appropriate for Physical therapy intervention at this time    Observation of patient:  Patient is in bed with Bed/chair alarm on, O2 at 3 liters/minute via nasal cannula      Cognition:  Oriented to: Oriented x4  Command following: Follows 1 step commands without difficulty  Alertness/Arousal: Appropriate responses to stimuli   Attention Span:Appears intact  Memory: Appears intact  Safety Awareness: minimal verbal instruction  Insights: Educated in safety awareness    Vital Signs (Cardiovascular):  BP Supine:  103/49 mmHg  HR Supine: 87 bpm  SpO2 at rest: 98% 3 Liters     Edema: None noted throughout bilateral UE/LE's   Skin Inspection: Bruising throughout bilateral UE's   Sensation: intact , to light touch, bilateral lower extremities and bilateral UE's      Balance:  Static Sitting:  WFL  Dynamic Sitting:  Not tested  Static Standing:  Good  Dynamic Standing:  Fair+            Musculoskeletal Examination:            Range of motion:  Right UE: Grossly WFL  Left UE: Grossly WFL  Right LE: Grossly WFL  Left LE: Grossly WFL       Strength:  Right UE: Grossly 4-/5  Left UE: Grossly 4-/5  Right LE: Grossly 4-/5  Left LE: Grossly 4-/5    Tone:         Functional Mobility:    Bed Mobility:  Supine to Sit:   Supervision to the left.   Cues for Sequencing., Cues for Hand placement., HOB flat  Sit to Supine:   Moderate assist to the right.   Cues for Sequencing., Cues for Hand placement., HOB flat, assist at LE(s)  Seated Scooting:   Supervision    Transfers:  Sit to Stand:  Supervision with Front wheeled walker.    Cues for Sequencing, Cues for Hand Placement  Stand to Sit:  Supervision.    Cues for Sequencing, Cues for Hand Placement    Locomotion:  LEVEL AMBULATION:  Distance: 73ftx1, total gait distance limited by decreased activity tolerance    Assistance level:  Supervision  Device:  Front wheeled walker  Pattern:  Reciprocal, Decreased cadence, Decreased step length:  bilaterally    AM-PACT "6 Clicks" Basic Mobility Inpatient Short Form  Turning Over in Bed: None  Sitting Down On/Standing From Armchair: A little (Not performed, per PT professional judgement )  Lying on Back to Sitting  on Side of Bed: A little  Assist Moving to/from Bed to Chair: A little (Not performed, per PT professional judgement )  Assist to Walk in Hospital Room: A little  Assist to Climb 3-5 Steps with Railing: A lot (Not performed, per PT professional judgement )  PT Basic Mobility Raw Score: 18  CMS 0-100% Score: 46.58%                 Participation and Activity Tolerance:  Participation effort: Good  Activity Tolerance: Tolerates 10-20 minutes of activity with multiple rests    Treatment Interventions this session:   Evaluation  Therapeutic activity  Gait training  Patient/family/caregiver education    Education Provided:   TOPICS: role of physical therapy, plan of care, goals of therapy and benefits of activity     Learner educated: Patient, Nursing Staff  Method: Explanation and Demonstration  Response to education: Verbalized understanding and Demonstrated understanding    Patient Position at End of Treatment:   Supine, in bed, in the room, Needs in reach, Bed/chair alarm set and No distress    Team Communication:     Spoke to: RN/LPN - Christina, CNA -  Barb   Regarding: Pre-session re: patient status, Patient position at end of session, Discharge needs, Patient participation with Therapy, Vital signs, Further recommendations  Whiteboard updated: No  PT/PTA communication: via written note  and verbal communication as needed.      Recommend patient up and ambulate and up to the bedside chair for all meals with the front wheeled walker and Ax1 as tolerated outside of PT sessions.    Johnnette Barrios, PT, DPT

## 2017-11-23 NOTE — Progress Notes (Signed)
11/23/17 1001   Patient Assessment   Status Completed   RT Priority 3   Subjective never smoker   Pulmonary History Other (Comment)  (CHF)   CXR xray shows COPD; pulmonary edema/min. bibasilar pleural effusion/thickening   Cough Non-productive;Dry   Bilateral Breath Sounds Clear   Home regimen   Home Treatments no   Home Oxygen no   Home CPAP/BiLevel no   Oxygen Therapy   O2 Device Nasal cannula   FiO2 32 %   O2 Flow Rate (L/min) 3 L/min   Severity Index   Severity Index MILD   Comments   Comments RCAT complete 1/1   Performing Departments   O2 Device performing department formula [id number]   Resp assess adult performing department formula [id number]   Nebs given performing department formula [id number]   Oxygen performing department formula [id number]     Pt's lungs are clear t/o. Dry, NPC. No home meds. Pt stated she is not having any trouble with her breathing. Pt agreed to call if or when he breathing is giving her trouble.    Made Duoneb PRN.

## 2017-11-23 NOTE — Plan of Care (Addendum)
Assumed care of patient at 0700. Pt resting quietly in bed. No s/s of distress. Pt denies pain or other needs at this time. Call bell within reach. Will continue to monitor.    0840: Md at bedside. Spoke regarding pt's steroid. Md gave verbal orders to hold morning dose. Will continue to assess.    1500: Assisted pt to restroom multiple times throughout shift. No needs verbalized by pt at this time. Will continue to assess.

## 2017-11-24 LAB — BASIC METABOLIC PANEL
Anion Gap: 10.4 mMol/L (ref 7.0–18.0)
BUN / Creatinine Ratio: 23.1 Ratio (ref 10.0–30.0)
BUN: 27 mg/dL — ABNORMAL HIGH (ref 7–22)
CO2: 29 mMol/L (ref 20.0–30.0)
Calcium: 9.6 mg/dL (ref 8.5–10.5)
Chloride: 101 mMol/L (ref 98–110)
Creatinine: 1.17 mg/dL (ref 0.60–1.20)
EGFR: 39 mL/min/{1.73_m2} — ABNORMAL LOW (ref 60–150)
Glucose: 69 mg/dL — ABNORMAL LOW (ref 71–99)
Osmolality Calc: 275 mOsm/kg (ref 275–300)
Potassium: 4.4 mMol/L (ref 3.5–5.3)
Sodium: 136 mMol/L (ref 136–147)

## 2017-11-24 LAB — CBC
Hematocrit: 34.3 % — ABNORMAL LOW (ref 36.0–48.0)
Hemoglobin: 11.6 gm/dL — ABNORMAL LOW (ref 12.0–16.0)
MCH: 33 pg (ref 28–35)
MCHC: 34 gm/dL (ref 32–36)
MCV: 97 fL (ref 80–100)
MPV: 7.6 fL (ref 6.0–10.0)
PLT CT: 149 10*3/uL (ref 130–440)
RBC: 3.55 10*6/uL — ABNORMAL LOW (ref 3.80–5.00)
RDW: 17.3 % — ABNORMAL HIGH (ref 11.0–14.0)
WBC: 4.8 10*3/uL (ref 4.0–11.0)

## 2017-11-24 LAB — LIPID PANEL
Cholesterol: 190 mg/dL (ref 75–199)
Coronary Heart Disease Risk: 3.11
HDL: 61 mg/dL (ref 45–65)
LDL Calculated: 108 mg/dL
Triglycerides: 107 mg/dL (ref 10–150)
VLDL: 21 (ref 0–40)

## 2017-11-24 MED ORDER — APIXABAN 5 MG PO TABS
5.00 mg | ORAL_TABLET | Freq: Two times a day (BID) | ORAL | Status: DC
Start: 2017-11-25 — End: 2017-11-27
  Administered 2017-11-25 – 2017-11-27 (×5): 5 mg via ORAL
  Filled 2017-11-24 (×6): qty 1

## 2017-11-24 MED ORDER — SODIUM CHLORIDE 0.9 % IV BOLUS
1000.00 mL | Freq: Once | INTRAVENOUS | Status: DC
Start: 2017-11-24 — End: 2017-11-27

## 2017-11-24 MED ORDER — VH POTASSIUM CHLORIDE CRYS ER 20 MEQ PO TBCR (WRAP)
20.00 meq | EXTENDED_RELEASE_TABLET | Freq: Two times a day (BID) | ORAL | Status: AC
Start: 2017-11-24 — End: 2017-11-25
  Administered 2017-11-24 – 2017-11-25 (×3): 20 meq via ORAL
  Filled 2017-11-24 (×3): qty 1

## 2017-11-24 MED ORDER — AMIODARONE HCL 900 MG/18ML IV SOLN
0.50 mg/min | INTRAVENOUS | Status: DC
Start: 2017-11-24 — End: 2017-11-25
  Administered 2017-11-24: 11:00:00 1 mg/min via INTRAVENOUS
  Filled 2017-11-24: qty 18

## 2017-11-24 MED ORDER — AMIODARONE HCL IN DEXTROSE 150-4.21 MG/100ML-% IV SOLN
150.00 mg | Freq: Once | INTRAVENOUS | Status: AC
Start: 2017-11-24 — End: 2017-11-24
  Administered 2017-11-24: 10:00:00 150 mg via INTRAVENOUS
  Filled 2017-11-24: qty 100

## 2017-11-24 MED ORDER — AMIODARONE HCL 200 MG PO TABS
200.00 mg | ORAL_TABLET | Freq: Two times a day (BID) | ORAL | Status: DC
Start: 2017-11-24 — End: 2017-11-24
  Filled 2017-11-24: qty 1

## 2017-11-24 MED ORDER — ENOXAPARIN SODIUM 80 MG/0.8ML SC SOLN
1.00 mg/kg | SUBCUTANEOUS | Status: DC
Start: 2017-11-24 — End: 2017-11-24
  Administered 2017-11-24: 12:00:00 70 mg via SUBCUTANEOUS
  Filled 2017-11-24: qty 0.8

## 2017-11-24 MED ORDER — AMIODARONE HCL 200 MG PO TABS
200.00 mg | ORAL_TABLET | Freq: Every day | ORAL | Status: DC
Start: 2017-11-25 — End: 2017-11-27
  Administered 2017-11-25 – 2017-11-27 (×3): 200 mg via ORAL
  Filled 2017-11-24 (×3): qty 1

## 2017-11-24 MED ORDER — FUROSEMIDE 40 MG PO TABS
40.00 mg | ORAL_TABLET | Freq: Every day | ORAL | Status: DC
Start: 2017-11-25 — End: 2017-11-27
  Administered 2017-11-25 – 2017-11-26 (×2): 40 mg via ORAL
  Filled 2017-11-24 (×3): qty 1

## 2017-11-24 NOTE — Plan of Care (Addendum)
Problem: Infection  Goal: Free from infection  Outcome: Progressing   11/24/17 1150   OTHER   Free from infection  Assess for signs/symptoms of infection;Utilize isolation precautions per protocol/policy;Assess immunization status;Consult/collaborate with Infection Preventionist;Utilize sepsis protocol       NURSE NOTE SUMMARY     Patient Name: [facility] WH[facility]   Attending Physician: Donnie Coffin, DO   Primary Care Physician: Flossie Dibble, MD   Date of Admission:   11/22/2017   Today's date:   11/24/2017 LOS: 2 days   Shift Summary:                                                              Assumed care of patient at 0700. Patient resting in bed, states having pain in back of 5/10, prn tylenol given. Denies any other needs at this time. Patient states she feels weaker than yesterday. BP low, HR elevated. MD and RRT made aware, patient having MEWS score of 6.    1145: Patient started on amiodarone. Patient denies any complaints. Vital signs q 15 minutes X4, then q 1 hour X4, then q 4 hours.     1600: Patient on 1 L NC, sats 98%. Patient weaned off of oxygen, will recheck.    Provider Notifications:   MD aware of low BP and elevated HR. MD ordered STAT EKG. EKG showed a. Fib, new orders given.    Rapid Response Notifications:  Mobility:   RRT notified of elevated HR and low BP. RRT to bedside.      PMP Activity: Step 6 - Walks in Room (11/24/2017  8:00 AM)     Weight tracking:  Family Dynamic:     Last 3 Weights for the past 72 hrs (Last 3 readings):   Weight   11/22/17 1753 67.9 kg (149 lb 9.6 oz)   11/22/17 1218 71 kg (156 lb 8.4 oz)       Recent Vitals:  Active Problems:     BP 98/65   Pulse 89   Temp 97.9 F (36.6 C) (Oral)   Resp 18   Ht 1.524 m (5')   Wt 67.9 kg (149 lb 9.6 oz)   SpO2 96%   BMI 29.22 kg/m          Active Problems:    Pneumonia

## 2017-11-24 NOTE — Progress Notes (Signed)
Per the [facility] Automatic Renal Dosing Protocol:    The following medication was adjusted based on the patient's creatinine clearance:    Medication: Enoxaparin  Indication: Arrhythmia  SCr: 0.98 mg/dL  CrCL: 09.8 ml/min  Previous Dosing Regimen: 70 mg SQ q12h  New Dosing Regimen: 70 mg SQ q24h        [facility], ext [phone]

## 2017-11-24 NOTE — Progress Notes (Signed)
Called for a patient with a SBP in the 60's.  Dr Jean Rosenthal was updated by the primary staff and orders received for an IV fluid bolus.  Upon arrival to the room the patient was awake and alert, no pain or SOB.  Her BP recovered to 118/62.  Her HR was still around 120, regular/irregular on palpation of radial pulse.  An EKG was recommend and ordered by Dr Jean Rosenthal and the IV bolus was held.  She does have a history of CHF and on daily lasix.  No RRT interventions needed at this time.  Patient is currently looking at the breakfast menu.  Will follow along with the primary staff as needed.

## 2017-11-24 NOTE — Progress Notes (Signed)
PROGRESS NOTE - [facility]    Date Time: 11/24/17 1:59 PM  Patient Name: Kelli Brown  Attending Physician: Herb Grays  MRN: [id number]       DOB: 1919/01/10      Assessment and Plan:                                                                                                 [facility]        Active Problems:    Pneumonia    New onset afib rvr with hypotension  Resolved with IV amio bolus.  Now NSR  Change to 200 mg oral amio daily tomorrow  eliquis bid  F/u with cardiology outpatient    PNEUMONIA  With [place] po intake  Chronic interstitial  lung disease   Cont ceftraixone and azithro   Influenza negative   Albuterol /atvent nebs prn     chf  appears well compensated   Cont home dose of lasix     Aortic valve stenosis  Not interested in any procedure   stable    htn  Cont metoprolol     Hyperlipidemia  cont statins     Poor vision /macular degeneration  Pt was worried as she lost her glasses in the ER , notified nurse to  Help find it          Nutrition: Regular Diet  GI Prophylaxis: not indicated  DVT Prophylaxis: low molecular weight heparin and SCD's while in bed  Code Status: DNR     Medical mangement, rx management    New onset afib rvr    Time spent with patient 50  Labs have been ordered for the morning  Consult cards  ekg reviewed    Labs and other studies were personally reviewed  MEDICATION changes were made and/or added  Case was discussed with RN    I have spent 50 with the patient discussing the principle problem (listed above), the active hospital problems (listed above) and the results of laboratory tests. Greater than 50% of the time was spent counseling the patient and coordinating care with Nursing staff (RN/LPN).                   Subjective                                                                                      [facility]      states she's feeling better today. She actually was feeling quite poorly earlier with dizziness when her  heart went into A. fib. She currently denies chest pain and shortness of breath. She feels better and actually felt like her pneumonia was gone until this happened.    Review of Systems:  Constitutional:  Denies fever .    ENT:  Denies sore throat or trouble swallowing.    CV:  Denies chest pain or palpitations.    Respiratory:  Denies SOB or cough.    GI:  Denies nausea, vomiting, diarrhea, constipation or abdominal pain.    GU:  Denies pain or burning with urination.    MS:  Denies new muscle or joint pain.    Skin:  Denies rash.    Heme: Denies any bleeding.    Physical Exam:                                                                              [facility]     Patient Vitals for the past 24 hrs:   BP Temp Temp src Pulse Resp SpO2   11/24/17 1240 125/55 97.9 F (36.6 C) Oral 82 18 96 %   11/24/17 1215 124/60 97.9 F (36.6 C) Oral 84 18 98 %   11/24/17 1135 98/65 97.9 F (36.6 C) Oral - 18 96 %   11/24/17 1105 96/50 97.7 F (36.5 C) Oral - 18 95 %   11/24/17 1046 - - - 89 - -   11/24/17 0951 110/62 100.4 F (38 C) Rectal - - -   11/24/17 0754 118/62 - - - - -   11/24/17 0739 (!) 64/45 98.4 F (36.9 C) Oral (!) 126 20 97 %   11/23/17 2216 94/44 98.2 F (36.8 C) Oral 92 16 96 %   11/23/17 1544 103/49 98.4 F (36.9 C) Oral 87 18 98 %        Intake/Output Summary (Last 24 hours) at 11/24/17 1359  Last data filed at 11/24/17 1215   Gross per 24 hour   Intake              860 ml   Output              600 ml   Net              260 ml     Wt Readings from Last 1 Encounters:   11/22/17 67.9 kg (149 lb 9.6 oz)        Constitutional: Well groomed and in no acute distress.    Psych: Alert and oriented X3. Affect is not depressed/anxious.    Eyes: Conjunctiva is pink without purulent drainage.    ENT: Hearing is grossly normal. Mucosa is moist.    CV: Heart shows regular rate and rhythm. Pedal pulses are 2+. Pedal edema absent.    Lungs: Clear to ascultation. No accessory muscle use.    Abd: No masses.  No tenderness. Bowel sounds present. No organomegaly.    MS: she is able to ambulate.     Skin: No rash. No nodules.      Meds:  [facility]     Estimated Creatinine Clearance: 24.2 mL/min (based on SCr of 1.17 mg/dL).    Current Facility-Administered Medications   Medication Dose Route Frequency   . [START ON 11/25/2017] amiodarone  200 mg Oral Daily   . [START ON 11/25/2017] apixaban  5 mg Oral Q12H SCH   . calcium-vitamin D  2 tablet Oral Daily   . cefTRIAXone  1 g Intravenous Q24H   . docusate sodium  100 mg Oral Daily   . dorzolamide  1 drop Left Eye TID   . [START ON 11/25/2017] furosemide  40 mg Oral Daily   . guaiFENesin  600 mg Oral Q12H SCH   . lactobacillus species  50 Billion CFU Oral Daily   . metoprolol succinate XL  50 mg Oral QAM   . potassium chloride  20 mEq Oral QAM   . potassium chloride  20 mEq Oral BID   . pravastatin  40 mg Oral QAM   . senna-docusate  2 tablet Oral QHS   . sodium chloride (PF)  3 mL Intravenous Q8H   . sodium chloride  1,000 mL Intravenous Once   . vitamin D  1,000 Units Oral QAM       PRN medications: acetaminophen **OR** acetaminophen **OR** acetaminophen, acetaminophen, albuterol, guaiFENesin, naloxone, zolpidem    IV Drips:    . amiodarone 1 mg/min (11/24/17 1120)       Labs and Imaging:                                                                        [facility]     RECENT LABS (from the last 7 days)    Recent Labs  Lab 11/24/17  1221 11/23/17  0753   WBC 4.8 3.6*   Hemoglobin 11.6* 10.7*   Hematocrit 34.3* 32.4*   PLT CT 149 134       Recent Labs  Lab 11/22/17  1257   PT 10.7   PT INR 1.1   aPTT 27.9       Recent Labs  Lab 11/22/17  1257   Troponin I 0.01       Recent Labs  Lab 11/22/17  1257   B-Natriuretic Peptide 146.6*      Recent Labs  Lab 11/24/17  1221 11/23/17  0753   Glucose 69* 108*   Sodium 136 137   Potassium 4.4 4.1   Chloride 101 103   CO2 29.0 24.8    BUN 27* 19   Creatinine 1.17 0.98   EGFR 39* 49*   Calcium 9.6 9.3           Recent Labs  Lab 11/23/17  0753 11/22/17  1257   Albumin 3.3* 4.0   Protein, Total 6.3 7.4   Bilirubin, Total 0.8 1.3*   Alkaline Phosphatase 56 68   ALT 13 16   AST (SGOT) 21 22      Recent Labs  Lab 11/22/17  1243   Specific Gravity, UR 1.015   pH, Urine 7.0   Protein, UR 30*   Glucose, UA Negative   Ketones UA 20*   Bilirubin, UA Negative   Blood, UA Moderate*   Nitrite, UA Negative   Urobilinogen, UA Normal  Leukocyte Esterase, UA Negative   WBC, UA <1   RBC, UA 24*   Bacteria, UA Rare*      No results found for: [id number]       Imaging, reviewed and are significant for:  XR Chest 2 Views   Final Result      Lung findings suggest chronic interstitial changes. Superimposed mild asymmetrical right pulmonary edema is difficult to exclude.      COPD.      Questionable Minimal bibasilar pleural effusion/thickening      ReadingStation:[id number]          Microbiology Results     Procedure Component Value Units Date/Time    Blood Culture - Venipuncture #1 [[id number]] Collected:  11/22/17 1257    Specimen:  Blood from Venipuncture Updated:  11/23/17 1106    Narrative:       Specimen/Source: Blood/Venipuncture  Collected: 11/22/2017 12:57     Status: Valued      Last Updated: 11/23/2017 11:04                Culture Result (Prelim)      No Growth To Date          Influenza A / B Rapid Test [[id number]] Collected:  11/22/17 1306    Specimen:  Nasal Wash Updated:  11/22/17 1334     Influenza A Negative     Influenza B Negative     Comment: Method: Jarvis Morgan    The sensitivity for this method is between 90% and 95% for Influenza A and around 90% for Influenza B. The specificity for both Influenza A and B is around 96%. False positive results may occur, especially when the prevalence of Influenza activity is low. This is more likely with Influenza B due to its lower prevalence. Clinical conditions, including the prevalence of influenza  activity, should be considered in the interpretation of results. If clinically indicated, results may be confirmed with PCR testing.  The above 2 analytes were performed by [facility] [phone])  [address] [address]         Narrative:       Influenza A antigen detection tests are unable to distinquish between novel and seasonal influenza A.    A negative result for either Influenza A or B antigen does not exclude influenza virus infection. Clinical correlation required.    All positive influenza antigen tests (A or B) require placement of patient on droplet precaution isolation.          Donnie Coffin, DO  [facility] [place]  [address]  [place], [id number]  Pager [phone]        This note was generated within an electronic medical record, and portions of it may have been completed with voice recognition software. Typographical, word substitution, pronoun and other language errors may occur. If there are substantial concerns about the content of this note that may affect patient care, please contact the author for clarification.

## 2017-11-24 NOTE — Progress Notes (Signed)
11/24/17 0848   CM Review   CM Comments 1/2 RNCM; Pt is from [facility]. DCP referred.     Johnston Ebbs, RN, BSN, CCM  Nurse Case Manager  [facility]  [phone]

## 2017-11-24 NOTE — Plan of Care (Signed)
Problem: Compromised Hemodynamic Status  Goal: Vital signs and fluid balance maintained/improved  Outcome: Progressing   11/24/17 2230   Goal/Interventions addressed this shift   Vital signs and fluid balance are maintained/improved Position patient for maximum circulation/cardiac output;Monitor/assess vitals and hemodynamic parameters with position changes;Monitor intake and output. Notify LIP if urine output is less than 30 mL/hour.;Monitor/assess lab values and report abnormal values;Monitor and compare daily weight       Problem: Infection  Goal: Free from infection  Outcome: Progressing   11/24/17 2230   OTHER   Free from infection  Utilize isolation precautions per protocol/policy;Assess for signs/symptoms of infection;Assess immunization status;Consult/collaborate with Infection Preventionist;Utilize sepsis protocol       Problem: Inadequate Tissue Perfusion  Goal: Adequate tissue perfusion will be maintained  Outcome: Progressing   11/24/17 2230   Goal/Interventions addressed this shift   Adequate tissue perfusion will be maintained Monitor/assess lab values and report abnormal values;Monitor/assess vital signs;Monitor/assess neurovascular status (pulses, capillary refill, pain, paresthesia, paralysis, presence of edema);Monitor intake and output;Monitor/assess for signs of VTE (edema of calf/thigh redness, pain);Monitor for signs and symptoms of a pulmonary embolism (dyspnea, tachypnea, tachycardia, confusion);VTE Prevention: Administer anticoagulant(s) and/or apply anti-embolism stockings/devices as ordered;Reinforce use of ordered respiratory interventions (i.e. CPAP, BiPAP, Incentive Spirometer, Acapella, etc.);Perform active/passive ROM;Reinforce ankle pump exercises;Encourage/assist patient as needed to turn, cough, and perform deep breathing every 2 hours;Increase mobility as tolerated/progressive mobility;Elevate feet;Position patient for maximum circulation/cardiac output;Place shoes or other foot  protection on patient;Provide wound/skin care;Assess and monitor skin integrity       Problem: Ineffective Airway Clearance  Goal: Airway is maintained  Outcome: Progressing   11/24/17 2230   Goal/Interventions addressed this shift   Airway is maintained  Teach/Reinforce use of incentive spirometer 10 times per hour while awake, cough and deep breath as needed;Maintain oxygen level of greater than 92% or as ordered by LIP (reworded);Reposition patient every 2 hours and as needed unless able to self-reposition       Problem: Impaired Mobility  Goal: Mobility/Activity is maintained at optimal level for patient  Outcome: Progressing   11/24/17 2230   Goal/Interventions addressed this shift   Mobility/activity is maintained at optimal level for patient Increase mobility as tolerated/progressive mobility;Encourage independent activity per ability;Perform active/passive ROM;Maintain proper body alignment;Plan activities to conserve energy, plan rest periods;Reposition patient every 2 hours and as needed unless able to reposition self;Assess for changes in respiratory status, level of consciousness and/or development of fatigue;Consult/collaborate with Physical Therapy and/or Occupational Therapy

## 2017-11-24 NOTE — OT Eval Note (Signed)
VHS: [facility]  Department of Rehabilitation Services: [phone]    Ranyah Groeneveld Hornbeck    CSN#: [id number]  GI/ENDO/GEN MED 506/506-A    Occupational Therapy Evaluation    Consult received for Ferol Luz Coghlan for OT Evaluation and Treatment.  Patient's medical condition is appropriate for Occupational therapy intervention at this time.    Time of treatment:   Time Calculation  OT Received On: 11/24/17  Start Time: 1439  Stop Time: 1514  Time Calculation (min): 35 min    Visit#: 1    Precautions and Contraindications:   Falls  Mobility protocol     OT Assessment/Clinical Decision Making:      Deshanta Lady Russey is a 84 y.o. female admitted 11/22/2017 presenting with malaise, fever, cough, and loss of appetite for the last several days prior to admission. Patient noted that at her baseline she was Independent with all ADL's, functional mobility, and was able to Independently ambulate community distances with a front wheeled walker. Patient currently requires Mod A for LB dressing, supervision for bathing, and supervision for functional mobility/transfers. Pt reports that she has been having difficulty donning/doffing socks at home; pt was educated on sock aid, however, pt would benefit from reinforcement/re-education next session. During eval, pt demo'd good participation, however, pt required several verbal cues for safety during ambulation and transfers (walker management and foot/hand placement)-- reinforcement will be required and STG created to reflect this need.     Resulting in impairments with decreased activity tolerance, decrease safety awareness and fall risk. These impairments are affecting the patient's ability to perform in needed/desired occupations resulting in performance deficits in bathing/showering, dressing and functional mobility/transfers. Would benefit from continued occupational therapy services for above noted impairments.     Rehabilitation Potential: Good      Risks/benefits/POC discussed: Patient    Plan:   Treatment/interventions: ADL retraining, functional transfer training, activity pacing, cognition (safety awareness, problem solving, etc.), patient/family training, equipment eval/education  - Continue practice with reacher and sock aid for LB dressing    Treatment Frequency: OT Frequency Recommended: 2-3x/wk    Goals:   STG in 2 to 3 visits:    1. Patient will donn/doff socks with Minimal assist and use of AE/AT prn. NEW  2. Patient will thread pants/undergarments through legs and over knees with Minimal assist and use of AE/AT/AD prn. NEW  3. Patient will perform 2-3 grooming tasks standing with FWW at sink with Supervision and use of AE/AT prn. NEW  4. Patient will demonstrate increased safety awareness by requiring no more than 2 verbal cues for proper walker management and hand placement during functional mobility/transfer tasks.  NEW    LTG (by d/c):     1. Patient will be Supervision for ADLs, Supervision for functional transfers/mobility with use of AD/DME/AT/AE. NEW     DISCHARGE RECOMMENDATIONS   DME recommended for Discharge:   Sock aid    Discharge Recommendations:   ALF (Return to [facility])         Medical & Therapy History:   Medical Diagnosis: Hypoxia [R09.02]  Febrile illness [R50.9]  Generalized weakness [R53.1]  Pneumonia of left lower lobe due to infectious organism [J18.1]      X-Rays/Tests/Labs:  Xr Chest 2 Views    Result Date: 11/22/2017  Lung findings suggest chronic interstitial changes. Superimposed mild asymmetrical right pulmonary edema is difficult to exclude. COPD. Questionable Minimal bibasilar pleural effusion/thickening ReadingStation:[id number]      Discussed with patient/family/caregiver the patient's physical,  cognitive and/or psychosocial history related to current functional performance: yes    Patient Active Problem List   Diagnosis   . Cervical spine fracture   . Ocular hypertension of left eye   . Chronic midline low  back pain without sciatica   . Nonrheumatic aortic (valve) stenosis   . Pneumonia        Past Medical/Surgical History:  Past Medical History:   Diagnosis Date   . Abnormal vision    . Arthritis    . Congestive heart failure    . Hip fx, right, closed, initial encounter 2010   . Hyperlipidemia    . Hypertension    . Low back pain    . Macular degeneration    . Nonrheumatic aortic (valve) stenosis 08/11/2017   . Shingles       Past Surgical History:   Procedure Laterality Date   . APPENDECTOMY     . EYE SURGERY      cateracts   . HIP SURGERY Bilateral     Tubes tied   . TONSILLECTOMY     . TUBAL LIGATION           Occupational Profile:   Home Living Arrangements  Living Arrangements: Facility: [facility]   Assistance Available: Full time , 24 hour supervision, 24 hour assistance  Type of Home: Assisted living   Bathroom:  raised toilet, 1 grab bar;  walk-in-shower, grab bars in shower, built-in shower seat    DME available at home:  Front wheeled Insurance risk surveyor (4 wheeled walker)    Prior Level of Function:  Community ambulation  Mobility:  Modified independence  with  a rollator walker inside the facility and front wheeled walker in public   Fall history: None noted within the past 3 months      Activities of Daily Living  Patient was independent with all ADLs    Instrumental Activities of Daily Living  Pt completes laudry and medication management tasks; does not drive (friends help with this); meals provided by ALF    Patient/caregiver goal for OT: None stated    Patient/caregiver concerns & priorities: Pt states that she is concerned about a lump in her throat (pt reports that she has already spoken to two doctors about it)    Subjective:   "I used to have one of those after my hip surgery." Re: Pt refers to a sock aid. Pt struggled with her socks today and states that she struggles with it at home at times.  Patient is agreeable to participation in the therapy session.    Pain:  At Rest: 0 /10  With  Activity: 0/10  Location: N/A  Interventions: None required    ASSESSMENT OF OCCUPATIONAL PERFORMANCE:   Observation of Patient:    Patient is in bed with peripheral IV, O2 at 2 liters/minute via nasal cannula    Vital Signs:   Vitals were taken before and after treatment session; Stable with no signs/symptoms of distress  HR during activity: 83 bpm     Oriented to: Oriented x4  Command following: Follows 1 step commands with repetition, 75% of the time  Safety Awareness: moderate verbal instruction  Insights: Educated in safety awareness  Behavior: Cooperative, Slightly Impulsive  Motor planning: Intact  Coordination: Mildly impaired Fine Motor as indicated by decreased accuracy of bilateral fingers to thumb opposition (arthritis)   Hand dominance: Right handed    Musculoskeletal Examination:   Range of motion:  Bilateral UE: Grossly  WFL       Strength:  Bilateral UE: Grossly WFL  Bilateral Grip Strength: 4/5         Sensory/Oculomotor Examination:   Auditory:  WFL=intact  Tactile-Light Touch:  WFL=intact However, pt does report that she occasionally gets numbness/tingling in bilateral hands while reading in a chair, although it always resolves quickly.  Visual Acuity:  wears glasses       Activities of Daily Living:   Eating:  Independent; , seated in a chair; , simulated  Grooming: Modified Independent with safety concerns, wash/dry hands, standing at sink with FWW  UB dressing:  Independent, Simulated, seated in a chair  LB dressing: Moderate assist overall in chair; Supervision with extended time and effort to don/doff R sock; initially max assist to don/doff L sock but with AE pt was Min/Mod A; At commode, pt was supervision to pull up over hips, pull down from hips  Bathing:  Supervision/Set up;, Simulated, seated in a chair  Toileting:  Supervision;, clothing management up, clothing management down, perineal hygiene, standard toilet use of grab bar and FWW    AM-PACT "6 Clicks" Daily Activity Inpatient Short  Form  Inpatient AM-PACT Performed?: yes  Put On/Take Off Lower Body Clothing: A lot  Assist with Bathing: A little  Assist with Toileting: A little  Put On/Take Off Upper Body Clothing: None  Assist with Grooming: None  Assist with Eating: None  OT Daily Activity Raw Score: 20  CMS 0-100% Score: 38.32%    Functional Mobility:   Bed Mobility:  Supine to Sit:   Minimal assist.   Cues for Log rolling., Cues for Sequencing., Cues for Hand placement., assist at trunk  Sit to Supine:   Supervision.   Cues for Sequencing.    Transfers:  Sit to Stand:  Supervision with Front wheeled walker.   Cues for Sequencing, Cues for Hand Placement  Stand to Sit:  Supervision.   Cues for Hand Placement  Chair: Supervision with Front wheeled walker.   Cues for Hand Placement, Cues for Walker Management  Toilet: Supervision with Front wheeled walker, [employer] left.   Cues for Sequencing, Cues for Hand Placement, Cues for Walker Management  Functional mobility/ambulation: Close Supervision  with Front wheeled walker.   Multiple verbal/physical cues for safety in regards to walker management    Balance:   Static Sitting:  Good  Dynamic Sitting:  Fair+  Static Standing:  Good  Dynamic Standing:  Fair+    Participation and Activity Tolerance   Participation effort: Good  Activity Tolerance: Tolerates 10-20 minutes of activity without rest breaks          Education Provided:   Topics: Role of occupational therapy, plan of care, goals of therapy and safety with mobility and ADLs, energy conservation techniques, pursed lip breathing, bed mobility with use of adaptive equipment or strategy, activity with nursing.    Individuals educated: Patient.  Method: Explanation and Demonstration.  Response to education: Verbalized understanding and Needs reinforcement.    Team Communication:   OT communicated with: RN/LPN - Sam  OT communicated regarding: Pre-session re: patient status, Patient position at end of session  OT/COTA communication: via written  note and verbal communication as needed.    Supine, in bed, in the room, Staff present: RN and CNA, Needs in reach and No distress    Recommend client participate in self-care tasks as tolerated with FWW assistive device and Supervision/assist x1 prn, OOB to chair for meals outside of and  in addition to OT session.    Shirlee Limerick, OTR/L

## 2017-11-24 NOTE — Progress Notes (Signed)
Discharge Planner Progress Note  [facility]   [address]   [place] [address]        11/24/17 1732   CM Review   CM Comments 11/24/17  - DCP: Need to complete initial assessment. Patient from ALF and plan to return when ready. Admitted with PNA; now being treated with Amiodarone for new onset afib with rvr. DCP will follow up tomorrow for complete initial assessment and discharge planning needs.        Case Update/Barriers: clinical course           Valli Glance, RN  Discharge Planner RN  [phone]

## 2017-11-24 NOTE — UM Notes (Signed)
[facility] ([facility]) Utilization Management Review Sheet    Facility :  [facility]    NAME: Kelli Brown  MR#: [id number]    CSN#: [id number]    ROOM: 506/506-A AGE: 84 y.o.    ADMIT DATE AND TIME: 11/22/2017 12:07 PM      PATIENT CLASS: Inpatient     ATTENDING PHYSICIAN: Donnie Coffin, DO  PAYOR:Payor: MEDICARE / Plan: MEDICARE PART A AND B / Product Type: Medicare /     DIAGNOSIS:     ICD-10-CM    1. Pneumonia of left lower lobe due to infectious organism J18.1    2. Hypoxia R09.02    3. Febrile illness R50.9    4. Generalized weakness R53.1        HISTORY:   Past Medical History:   Diagnosis Date   . Abnormal vision    . Arthritis    . Congestive heart failure    . Hip fx, right, closed, initial encounter 2010   . Hyperlipidemia    . Hypertension    . Low back pain    . Macular degeneration    . Nonrheumatic aortic (valve) stenosis 08/11/2017   . Shingles        DATE OF REVIEW: 11/24/2017    VITALS: BP 118/62   Pulse (!) 126   Temp 98.4 F (36.9 C) (Oral)   Resp 20   Ht 1.524 m (5')   Wt 67.9 kg (149 lb 9.6 oz)   SpO2 97%   BMI 29.22 kg/m     Active Hospital Problems    Diagnosis   . Pneumonia     Admitted via the ED "Kelli Brown is a 84 y.o. female with PMHx of chf ,  ild , pneumonia in past   who lives in alf  Presented with  Malaise , fever, cough  And loss of appetite for last 2 to 3 days .   In ER was febrile with 102,"    Labs: glucose 108, EGFR 39, BNP 146.6, WBC 3.6, H&H 10.7/32.4    Color, UA Yellow  Colorless,Yellow,Straw Final   Clarity, UA Slightly Cloudy   Clear Final   Specific Gravity, UR 1.015  1.001 - 1.040 Final   pH, Urine 7.0  5.0 - 8.0 pH Final   Protein, UR 30   Negative mg/dL Final   Glucose, UA Negative  Negative mg/dL Final   Ketones UA 20   Negative,5 mg/dL Final   Bilirubin, UA Negative  Negative Final   Blood, UA Moderate   Negative Final   Nitrite, UA Negative  Negative Final   Urobilinogen, UA Normal  Normal mg/dL Final   Leukocyte Esterase, UA Negative   Negative Leu/uL Final   UR Micro Performed   Final   WBC, UA <1  0 - 4 /hpf Final   RBC, UA 24   0 - 5 /hpf Final   Bacteria, UA Rare   None /hpf Final     Imaging: chest xray: "Questionable Minimal bibasilar pleural effusion/thickening"    EKG: "Atrial fibrillation   ST depression, probably rate related "    Febrile up to 102, tachycardic (up to 126)    Meds: tylenol x 1 in ED, pacerone bid, amiodarone ivpb x 1, asa qd, zithromax x 2, rocephin ivpb qd, colace qd, lovenox sc qd, lasix qd, bio-k plus qd, toprol qd, K-dur qd, prednisone qd, pericolace qd, IV fluids, vancomycin ivpb x 1, tylenol x 1, robitussin x 1  Plans: admitted with pneumonia, IV antibiotics, pain management, dvt prophylaxis, RT assess and treat, OT and PT evaluation, pulmonary toilet, telemetry monitoring    Alvin Critchley RN  [facility]  Utilization Review  P [phone]  F [phone]

## 2017-11-24 NOTE — Plan of Care (Addendum)
Problem: Inadequate Tissue Perfusion  Goal: Adequate tissue perfusion will be maintained  Outcome: Progressing   11/24/17 0013   Goal/Interventions addressed this shift   Adequate tissue perfusion will be maintained Monitor/assess vital signs;Monitor/assess lab values and report abnormal values;Monitor/assess neurovascular status (pulses, capillary refill, pain, paresthesia, paralysis, presence of edema);Monitor intake and output;Monitor/assess for signs of VTE (edema of calf/thigh redness, pain);Monitor for signs and symptoms of a pulmonary embolism (dyspnea, tachypnea, tachycardia, confusion);Encourage/assist patient as needed to turn, cough, and perform deep breathing every 2 hours;Perform active/passive ROM;Increase mobility as tolerated/progressive mobility;Elevate feet;Assess and monitor skin integrity       NURSE NOTE SUMMARY     Patient Name: [facility]   Attending Physician: Donnie Coffin, DO   Primary Care Physician: Flossie Dibble, MD   Date of Admission:   11/22/2017   Today's date:   11/24/2017 LOS: 2 days   Shift Summary:                                                              Assumed patient care at 1900. Patient denies any n/v or pain. Patient is on 2.5L O2 via nasal canula at this time. Patient requests to be able to take a nap. Patient call bell and bedside table are within reach, bed alarm is on and in lowest position. Will continue to monitor.     2300 - Patient requests cough medication. Robitussin administered per [facility].    Provider Notifications:      Rapid Response Notifications:  Mobility:        PMP Activity: Step 6 - Walks in Room (11/23/2017  3:47 PM)     Weight tracking:  Family Dynamic:     Last 3 Weights for the past 72 hrs (Last 3 readings):   Weight   11/22/17 1753 67.9 kg (149 lb 9.6 oz)   11/22/17 1218 71 kg (156 lb 8.4 oz)       Recent Vitals:  Active Problems:     BP 94/44   Pulse 92   Temp 98.2 F (36.8 C) (Oral)   Resp 16   Ht 1.524 m (5')   Wt 67.9 kg (149 lb  9.6 oz)   SpO2 96%   BMI 29.22 kg/m          Active Problems:    Pneumonia

## 2017-11-24 NOTE — Consults (Signed)
INITIAL CONSULTATION  [facility] CARDIOLOGY & VASCULAR MEDICINE    Date Time: 11/24/17 10:54 AM  Patient Name: Kelli Brown, Kelli Brown  MRN#: [id number]  DOB: [birth year]  Requesting Physician: Donnie Coffin, DO    Reason for Consultation:   Atrial Fibrillation  History:   Ericha Whittingham Cillo is a 84 y.o. female who was seen in our office in September 2018 with Dr. Jarrett Soho. The patient follows with Dr. Crista Luria is her primary care provider. The patient has a history of aortic stenosis with the last echocardiogram on 08/25/17 shows an ejection fraction of 60-65 percent, left atrium is severely enlarged, and moderate aortic stenosis with a peak velocity 3.5 m/s the mean gradient of 24 mmHg. The patient also is noted to have pulmonary arterial pressures of 55 mmHg, and the moderate range.    The patient is a past cardiac history of hypertension, congestive heart failure, hyperlipidemia, and nonrheumatic aortic stenosis. The patient also has a documented history of pulmonary hypertension, I suspect this is secondary to her aortic left ventricular disease.     The patient reports that this morning when she was ambulating to the restroom she suffered quite a bit of weakness, diaphoresis, and shortness of breath. Her blood pressures systolically were in the 60s and she hadn't irregular pulse. Rapid response team was contacted who did an EKG identifying atrial fibrillation with a rapid ventricular rate. The patient was given an amiodarone bolus which converted her to sinus rhythm in the 90s with frequent PACs and rare PVCs. The patient was then initiated on amiodarone infusion. Her echocardiogram shows a severely dilated left atria which likely means that she has had paroxysmal atrial fibrillation for quite some time, but never captured on the monitor. The patient is a CHADsvasc2 score of 5 and we discussed anticoagulation. The patient had a sister-in-law who had a large stroke and she requests anticoagulation. We  discussed her options and she would like to start with Eliquis and she will see how much this costs through [facility].     As the patient was symptomatic of atrial fibrillation, I would like to keep her in sinus rhythm as best as possible. The patient's blood pressure remains 90s over 50s. I do not have a large amount of room to titrate her beta blocker up so amiodarone will be a good choice for this patient. We will obtain pulmonary function testing as a baseline when she is cleared her pneumonia.     The patient attends [address] on [address] [place] in [place], [place]. The patient now lives in assisted living. She reports that she is not very active, but tries to walk the length of her facility after each meal with a friend. The patient reports her other activities Bingo. The patient does not drive and she misses driving quite a bit. She reports that she would like to go to the store more often.     Past Medical History:     Past Medical History:   Diagnosis Date   . Abnormal vision    . Arthritis    . Congestive heart failure    . Hip fx, right, closed, initial encounter 2010   . Hyperlipidemia    . Hypertension    . Low back pain    . Macular degeneration    . Nonrheumatic aortic (valve) stenosis 08/11/2017   . Shingles        Past Surgical History:     Past Surgical History:  Procedure Laterality Date   . APPENDECTOMY     . EYE SURGERY      cateracts   . HIP SURGERY Bilateral     Tubes tied   . TONSILLECTOMY     . TUBAL LIGATION       Family History:     Family History   Problem Relation Age of Onset   . Stroke Mother    . Hypertension Mother    . Coronary artery disease Mother    . Cancer Father    . Stroke Sister    . CABG Brother      Social History:     Social History     Social History   . Marital status: Widowed     Spouse name: N/A   . Number of children: N/A   . Years of education: N/A     Social History Main Topics   . Smoking status: Never Smoker   . Smokeless tobacco:  Never Used   . Alcohol use No   . Drug use: No   . Sexual activity: Not Currently     Birth control/ protection: Post-menopausal     Other Topics Concern   . Not on file     Social History Narrative   . No narrative on file     Allergies:     Allergies   Allergen Reactions   . Codeine Nausea And Vomiting and Other (See Comments)     LIGHTHEADEDNESS.   . Darvon [Propoxyphene] Nausea And Vomiting and Other (See Comments)     LIGHTHEADEDNESS.   Marland Kitchen Phenobarbital Swelling and Rash     Medications:     Current Facility-Administered Medications   Medication Dose Route Frequency   . aspirin EC  81 mg Oral QAM   . calcium-vitamin D  2 tablet Oral Daily   . cefTRIAXone  1 g Intravenous Q24H   . docusate sodium  100 mg Oral Daily   . dorzolamide  1 drop Left Eye TID   . enoxaparin  1 mg/kg Subcutaneous Q24H   . furosemide  40 mg Oral 5X WEEKLY (AM)   . guaiFENesin  600 mg Oral Q12H SCH   . lactobacillus species  50 Billion CFU Oral Daily   . metoprolol succinate XL  50 mg Oral QAM   . potassium chloride  20 mEq Oral QAM   . pravastatin  40 mg Oral QAM   . senna-docusate  2 tablet Oral QHS   . sodium chloride (PF)  3 mL Intravenous Q8H   . sodium chloride  1,000 mL Intravenous Once   . vitamin D  1,000 Units Oral QAM     Review of Systems:   Review of Systems   Constitutional: Positive for diaphoresis and malaise/fatigue.   HENT: Negative.    Eyes: Negative.    Respiratory: Positive for cough and shortness of breath.    Cardiovascular: Negative.    Gastrointestinal: Negative.    Genitourinary: Negative.    Musculoskeletal: Positive for joint pain.   Skin: Negative.    Neurological: Positive for weakness.   Endo/Heme/Allergies: Bruises/bleeds easily.   Psychiatric/Behavioral: Negative.    All other systems reviewed and are negative.    Physical Exam:     Vitals:    11/24/17 1046   BP:    Pulse: 89   Resp:    Temp:    SpO2:        Intake and Output Summary (Last 24 hours) at Date Time  Intake/Output Summary (Last 24 hours) at  11/24/17 1054  Last data filed at 11/24/17 2956   Gross per 24 hour   Intake              860 ml   Output              100 ml   Net              760 ml     Physical Exam   Constitutional: She is oriented to person, place, and time. She appears well-developed and well-nourished. No distress.   Eyes: Pupils are equal, round, and reactive to light. Conjunctivae and EOM are normal.   Neck: Normal range of motion. Neck supple. No JVD present. No tracheal deviation present.   Cardiovascular: Normal rate, regular rhythm, S1 normal, S2 normal and intact distal pulses.  Exam reveals no gallop, no friction rub and no opening snap.    Murmur heard.   Harsh midsystolic murmur is present with a grade of 3/6  at the upper right sternal border radiating to the neck  Pulses:       Carotid pulses are on the right side with bruit, and on the left side with bruit.  Pulmonary/Chest: Effort normal. Tachypnea noted. No respiratory distress. She has decreased breath sounds. She has no wheezes. She has rales in the right lower field and the left lower field. She exhibits no tenderness.   Abdominal: Soft. Bowel sounds are normal. She exhibits no distension. There is no tenderness.   Musculoskeletal: She exhibits no edema.        Right lower leg: She exhibits tenderness.        Left lower leg: She exhibits tenderness.   Neurological: She is alert and oriented to person, place, and time. No cranial nerve deficit.   Skin: Skin is warm and dry. No rash noted. She is not diaphoretic. No erythema. No pallor.   Varicose veins present bilaterally.   Psychiatric: She has a normal mood and affect. Her behavior is normal. Judgment and thought content normal.     Labs Reviewed:     Recent Labs  Lab 11/23/17  0753 11/22/17  1257   Glucose 108* 108*   BUN 19 17   Creatinine 0.98 1.19   Sodium 137 138   Potassium 4.1 3.9   Chloride 103 101   CO2 24.8 25.9   AST (SGOT) 21 22   ALT 13 16   TSH 0.60  --            Recent Labs  Lab 11/23/17  0753 11/22/17   1257   WBC 3.6* 4.6   Hemoglobin 10.7* 11.8*   Hematocrit 32.4* 33.3*   PLT CT 134 138   PT INR  --  1.1       Recent Labs  Lab 11/22/17  1257   Troponin I 0.01       Recent Labs  Lab 11/22/17  1257   B-Natriuretic Peptide 146.6*       EKG:  atrial fibrillation at 133 bpm, QRS: 79 ms, QT: 444 ms, No ST Segment Changes    Tele: Converted to SR at 10:16 (After Amiodarone Bolus and prior to Drip)    Assessment:   1. Paroxysmal atrial fibrillation: New onset. Was identified this AM. Patient placed on amiodarone infusion with bolus . In Sinus Rhythm. Goal HR <110. CHADS2 VASC Score: CHF hypertension female sex  >75 =4  Anticoagulation: Lovenox, will [place] and  switch to Eliquis.   Rate Controlled with metoprolol and Amiodarone.   Antiarrythmics: Amiodarone   2. Pneumonia: Continue management per primary team.  3. Pulmonary hypertension, secondary: Identified on echocardiogram. We will repeat an echocardiogram.  4. Aortic stenosis: Moderate. Stable.   5. Hypertension: well controlled. Taking Beta-Blocker and Loop Diuretic.  6. Hyperlipidemia: We'll check lipids  7. Hypokalemia: Give BID x3 doses.       Plan:   1. Continue amiodarone infusion for protocol.  2. Switch to 200 mg oral amiodarone daily tomorrow.  3. Add Eliquis 5 mg twice daily.  4. EKG  5. Check lipid panel in the morning    Thank you very much for this consultation. We will continue to follow along with you.    Signed by: Rosalita Levan, FNP  11/24/2017

## 2017-11-25 LAB — ECG 12-LEAD
P Wave Axis: 40 deg
P-R Interval: 149 ms
Patient Age: 89 years
Patient Age: 89 years
Q-T Interval(Corrected): 444 ms
Q-T Interval(Corrected): 437 ms
Q-T Interval: 298 ms
Q-T Interval: 342 ms
QRS Axis: 15 deg
QRS Axis: 16 deg
QRS Duration: 79 ms
QRS Duration: 80 ms
T Axis: 61 years
T Axis: 43 years
Ventricular Rate: 133 //min
Ventricular Rate: 98 //min

## 2017-11-25 NOTE — Progress Notes (Signed)
[facility]   [address]   [place] [address]        11/25/17 1257   CM Review   Case Management Assessment Status Assessment Complete   CM Comments 11/25/17 - DCP: DCP met with patient at bedside. Initial assessment completed. DCP discussed discharge planning process, insurance, facility options, transporation and discharge plan with patient.  Patient reports she is independent at ALF and plans to return; states she has all needed DME; FWW and rollator. DCP contacted facility and they are aware of plan for patient to return when ready. DCP will follow for updates, changes and discharge planning needs.      INITIAL ASSESSMENT  Discharge Planner    Patient identified as a Moderate Risk for readmission based on the current risk score  Estimated D/C Date:    Risk Score: 14 %   Contact with whom discharge plan was discussed:   Patient   Facility List and Fact Sheet Provided: Y/N  N/A   Facility Preferences and Order of Preference   Return to [facility] in [place]   Financial status              UAI/PAS Requested N/A           [employer]   Med Assist Referral/FIS    Patient declines; over assets   RX Coverage:   Yes   Confirmed PCP with patient: name/last visit Yes; Dr. Kennedy Bucker; patient last saw on Dec 11.    Anticipated DME needed for D/C No; patient has FWW and rollater   Inpatient Plan of Care:    89 admitted to [facility] ED from ALF where she is independent at baseline. Patient presented with fever, generalized weakness, cough; diagnosed with PNA. Plan is for patient to return to ALF when ready.    DCP Interventions/Comments:    DCP met with patient at bedside. Initial assessment completed. DCP discussed discharge planning process, insurance, facility options, transporation and discharge plan with patient.  Patient reports she is independent at ALF and plans to return; states she has all needed DME; FWW and rollator. DCP contacted facility  and they are aware of plan for patient to return when ready. DCP will follow for updates, changes and discharge planning needs.    Barriers:   Clinical course   Transportation needed:   Per patient, friend will transport her back to facility when ready. DCP advised of option for VMT transport.           11/25/17 1241   Healthcare Decisions   Interviewed: Patient   Interviewee Contact Information: Patient   Orientation/Decision Making Abilities of Patient Alert and Oriented x3, able to make decisions   Prior to admission   Prior level of function Independent with ADLs   Type of Residence Assisted living   Home Layout One level   Have running water, electricity, heat, etc? Yes   Living Arrangements Other (Comment)  ([facility])   How do you get to your MD appointments? friends/facility staff   How do you get your groceries? friends/facility staff   Who fixes your meals? friends/facility staff   Who does your laundry? self   Who picks up your prescriptions? friends/facility staff   Dressing Independent   Grooming Independent   Feeding Independent   Bathing Independent   Toileting Independent   DME Currently at [facility] wheel walker;Four wheel walker   Name of Prior Assisted  Living Facility [facility]   Discharge Planning   Support Systems Other (Comment);Friends/neighbors;Family members  (ALF staff)   Patient expects to be discharged to: [facility]   Anticipated  plan discussed with: Same as interviewed   Mode of transportation: Private car (friend)  (Per patient, friend will transport when ready.)   Consults/Providers   PT Evaluation Needed 1   OT Evalulation Needed 1   SLP Evaluation Needed 2   Correct PCP listed in Epic? Yes

## 2017-11-25 NOTE — Progress Notes (Signed)
PROGRESS NOTE - [facility]     Patient Name: Kelli Brown,Kelli Brown LOS: 3 days   Attending Physician: Genevieve Norlander, MD   Primary Care Physician: Flossie Dibble, MD   Assessment and Plan:                                                            [facility]   Active Problem List  Active Problems:    Pneumonia    Plan  New onset afib rvr with hypotension  Currently in normal sinus rhythm  Amiodarone 200 mg daily  Apixaban 5 mg every 12  Appreciate Dr. Tressia Danas input    PNEUMONIA    Chronic interstitial lung disease   Rocephin 1 g IV daily  Azithromycin tablets  Flu negative  Albuterol /atvent nebs prn     Chronic diastolic CHF - compensated  Continue Lasix    Aortic valve stenosis  Not interested in any procedure   stable    Hypertension  Cont metoprolol     Hyperlipidemia  cont statins     Poor vision /macular degeneration  Pt was worried as she lost her glasses in the ER , notified nurse to Help find it     Nutrition: Regular Diet  GI Prophylaxis: not indicated  DVT Prophylaxis: low molecular weight heparin and SCD's while in bed  Code Status: DNR   Interval History and Objective:                         [facility]   CC: <principal problem not specified>    [name] is a 84 y.o. female who     - Patient seems to be ambulating the hallways without any difficulty and is wearing a nasal cannula  - Her amiodarone was just converted over today and we are monitoring over the next 24 hours as  - patient lives in the assisted living facility     ROS:As in HPI for 12 pt review of systems, otherwise negative.     Patient Vitals for the past 12 hrs:   BP Temp Pulse Resp   11/25/17 1138 105/47 98.8 F (37.1 C) 86 17   11/25/17 1056 133/59 - 96 -   11/25/17 0711 128/57 99.3 F (37.4 C) 95 18   11/25/17 0214 112/55 98.8 F (37.1 C) 91 16     Weight Monitoring 01/09/2017 01/10/2017 01/13/2017 05/04/2017 08/11/2017 11/22/2017 11/22/2017   Height 152.4 cm 152.4 cm 152.4 cm 154.9 cm  152.4 cm 160 cm 152.4 cm   Height Method - Stated - Stated - Stated Stated   Weight 70.308 kg 68.947 kg 68.9 kg 68.9 kg 70.852 kg 71 kg 67.858 kg   Weight Method - Stated - Estimated - Stated Standing Scale   BMI (calculated) 30.3 kg/m2 29.7 kg/m2 29.7 kg/m2 28.8 kg/m2 30.6 kg/m2 27.8 kg/m2 29.3 kg/m2        Physical Exam   Constitutional: She appears well-nourished.   HENT:   Head: Normocephalic and atraumatic.   Eyes: Pupils are equal, round, and reactive to light. EOM are normal.   Neck: Normal range of motion. Neck supple.   Cardiovascular: Regular rhythm.    Murmur (Holosystolic) heard.  Pulmonary/Chest: Breath sounds normal. No respiratory distress. She has  no wheezes.   Abdominal: Soft. Bowel sounds are normal.   Distended and obese   Musculoskeletal: Normal range of motion.   Neurological: She is alert.   Skin: Skin is warm.   Psychiatric: She has a normal mood and affect. Her behavior is normal.      Labs, Meds, Imaging, Etc. [facility]     Recent Labs  Lab 11/24/17  1221 11/23/17  0753   WBC 4.8 3.6*   RBC 3.55* 3.34*   Hemoglobin 11.6* 10.7*   Hematocrit 34.3* 32.4*   MCV 97 97   PLT CT 149 134       Recent Labs  Lab 11/22/17  1257   PT 10.7   PT INR 1.1   aPTT 27.9       Recent Labs  Lab 11/22/17  1257   Troponin I 0.01     No results found for: [id number]   Recent Labs  Lab 11/24/17  1221 11/23/17  0753 11/22/17  1257   Glucose 69* 108* 108*   Sodium 136 137 138   Potassium 4.4 4.1 3.9   Chloride 101 103 101   CO2 29.0 24.8 25.9   BUN 27* 19 17   Creatinine 1.17 0.98 1.19   EGFR 39* 49* 39*   Calcium 9.6 9.3 10.0       Recent Labs  Lab 11/23/17  0753 11/22/17  1257   Albumin 3.3* 4.0   Protein, Total 6.3 7.4   Bilirubin, Total 0.8 1.3*   Alkaline Phosphatase 56 68   ALT 13 16   AST (SGOT) 21 22       Recent Labs  Lab 11/22/17  1243   Specific Gravity, UR 1.015   pH, Urine 7.0   Protein, UR 30*   Glucose, UA Negative   Ketones UA 20*   Bilirubin, UA Negative   Blood, UA Moderate*   Nitrite, UA  Negative   Urobilinogen, UA Normal   Leukocyte Esterase, UA Negative   WBC, UA <1   RBC, UA 24*   Bacteria, UA Rare*      Estimated Creatinine Clearance: 24.2 mL/min (based on SCr of 1.17 mg/dL).   Scheduled Meds:  Current Facility-Administered Medications   Medication Dose Route Frequency   . amiodarone  200 mg Oral Daily   . apixaban  5 mg Oral Q12H SCH   . calcium-vitamin D  2 tablet Oral Daily   . cefTRIAXone  1 g Intravenous Q24H   . docusate sodium  100 mg Oral Daily   . dorzolamide  1 drop Left Eye TID   . furosemide  40 mg Oral Daily   . guaiFENesin  600 mg Oral Q12H SCH   . lactobacillus species  50 Billion CFU Oral Daily   . metoprolol succinate XL  50 mg Oral QAM   . potassium chloride  20 mEq Oral QAM   . pravastatin  40 mg Oral QAM   . senna-docusate  2 tablet Oral QHS   . sodium chloride (PF)  3 mL Intravenous Q8H   . sodium chloride  1,000 mL Intravenous Once   . vitamin D  1,000 Units Oral QAM    PRN Meds: acetaminophen **OR** acetaminophen **OR** acetaminophen, acetaminophen, albuterol, guaiFENesin, naloxone, zolpidem.   Marland Kitchen amiodarone Stopped (11/25/17 1214)      Patient Lines/Drains/Airways Status    Active PICC Line / CVC Line / PIV Line / Drain / Airway / Intraosseous Line / Epidural Line / ART Line /  Line / Wound / Pressure Ulcer / NG/OG Tube     Name:   Placement date:   Placement time:   Site:   Days:    Peripheral IV 11/25/17 Right Forearm  11/25/17    1347    Forearm    less than 1               Imagine reviewed and Significant for:  Xr Chest 2 Views    Result Date: 11/22/2017  Lung findings suggest chronic interstitial changes. Superimposed mild asymmetrical right pulmonary edema is difficult to exclude. COPD. Questionable Minimal bibasilar pleural effusion/thickening ReadingStation:[id number]     I have spent 38 minutes with the patient discussing the principle problem (listed above), the active hospital problems (listed above), discharge planning issues, the results of radiology tests  and the results of laboratory tests.   Greater than 50% of the time was spent counseling the patient and coordinating care with Nursing staff (RN/LPN).              MRN: [id number]           CSN: [id number]  DOB: Nov 22, 1919   Genevieve Norlander, MD  11/25/17 1:59 PM  [facility] Pager  -  [phone]  [facility] Mobile  -  [phone] [facility]  [address]  [place], [place]  [phone]

## 2017-11-25 NOTE — Plan of Care (Signed)
Problem: Compromised Hemodynamic Status  Goal: Vital signs and fluid balance maintained/improved  Outcome: Progressing   11/25/17 1826   Goal/Interventions addressed this shift   Vital signs and fluid balance are maintained/improved Monitor intake and output. Notify LIP if urine output is less than 30 mL/hour.;Monitor/assess lab values and report abnormal values;Monitor/assess vitals and hemodynamic parameters with position changes       Problem: Infection  Goal: Free from infection  Outcome: Progressing   11/25/17 1826   OTHER   Free from infection  Assess for signs/symptoms of infection       Problem: Inadequate Tissue Perfusion  Goal: Adequate tissue perfusion will be maintained  Outcome: Progressing   11/25/17 1826   Goal/Interventions addressed this shift   Adequate tissue perfusion will be maintained Monitor/assess vital signs;Monitor/assess lab values and report abnormal values;Monitor intake and output

## 2017-11-25 NOTE — Progress Notes (Signed)
Cardiology Progress Note      Date Time: 11/25/17 8:17 AM  Patient Name: Kelli Brown,Kelli Brown      Subjective:   Back in NSR creat normal tolerating Eliquis and Amio  Comfortable  Has cough nno distress    Physical Exam:   Blood pressure 128/57, pulse 95, temperature 99.3 F (37.4 C), temperature source Oral, resp. rate 18, height 1.524 m (5'), weight 67.9 kg (149 lb 9.6 oz), SpO2 91 %.    Temp (24hrs), Avg:98.2 F (36.8 C), Min:97.3 F (36.3 C), Max:100.4 F (38 C)    Wt Readings from Last 1 Encounters:   11/22/17 67.9 kg (149 lb 9.6 oz)     Intake and Output Summary (Last 24 hours) at Date Time    Intake/Output Summary (Last 24 hours) at 11/25/17 0817  Last data filed at 11/25/17 0221   Gross per 24 hour   Intake           961.55 ml   Output             2090 ml   Net         -1128.45 ml       General appearance - alert, amazing for 96 in no distress  Neck - supple, no bruit  Chest - clear to auscultation anteriorly  no wheezes  Heart - normal sinus rhythm by exam  Aortic stenosis  and TR mumurs  Abdomen - soft, nontender   Extremities - no edema    Medications:   Reviewed.    Labs:       Recent Labs  Lab 11/24/17  1221  11/22/17  1257   WBC 4.8 More results in Results Review 4.6   Hemoglobin 11.6* More results in Results Review 11.8*   Hematocrit 34.3* More results in Results Review 33.3*   PLT CT 149 More results in Results Review 138   PT INR  --   --  1.1   More results in Results Review = values in this interval not displayed.    Recent Labs  Lab 11/22/17  1257   Troponin I 0.01     B-Natriuretic Peptide   Date/Time Value Ref Range Status   11/22/2017 1257 146.6 (H) 0.0 - 100.0 pg/mL Final     Comment:     The above 1 analytes were performed by [facility] [phone])  [address],[place] [address]         Recent Labs  Lab 11/24/17  1221 11/23/17  0753   Glucose 69* 108*   BUN 27* 19   Creatinine 1.17 0.98   Sodium 136 137   Potassium 4.4 4.1   Chloride 101 103   CO2 29.0 24.8    AST (SGOT)  --  21   ALT  --  13   TSH  --  0.60     Cholesterol   Date/Time Value Ref Range Status   11/24/2017 1221 190 75 - 199 mg/dL Final     Triglycerides   Date/Time Value Ref Range Status   11/24/2017 1221 107 10 - 150 mg/dL Final     HDL   Date/Time Value Ref Range Status   11/24/2017 1221 61 45 - 65 mg/dL Final     LDL Calculated   Date/Time Value Ref Range Status   11/24/2017 1221 108 mg/dL Final        Rads:   Radiology: all results in the last 24 hours  No results found.  Cardiographics:     Echo 08/25/17  EF 60-65%  AS peak vel 3.5  Mean grad 24  RVSP 55  Telemetry: NSR    Assessment:   Active Problems:    Pneumonia  P afib---NSR on Amio  Aortic stenosis  Pulmonary hypertension      Plan:   Mobilize  Continue Eliquis and Amio    Carlyon Shadow, MD

## 2017-11-25 NOTE — PT Progress Note (Signed)
Physical Therapy PROGRESS note                             VHS: [facility]  Patient: Kelli Brown     CSN: [id number]    Bed: 506/506-A    Visit#: 2   Treatment Frequency: 3-4x/wk  Last seen by a physical therapist vs. Physical therapist assistant: 11/23/17    DISCHARGE RECOMMENDATIONS   Discharge Recommendations:   ALF;Other(Comment) (with PT services)   *Discharge recommendations are subject to change based on patient's progress and/or home support changes - please refer to most recent PT note for current recommendation    DME recommended for Discharge:   Has needed equipment    PMP (Progressive Mobility Program) Recommendations:   Recommend patient  ambulate 2-3 times/day with Wheeled walker and physical assist of 1 staff   as tolerated.     Precautions and Contraindications:   Falls  Mobility protocol     PT Assessment and Plan of Care (Treatment frequency noted above):   HPI (per physician charting) and Pertinent Medical Details:    Admitted 11/22/2017 with/for malaise, fever, cough, and loss of appetite for the last 2-3 days.      Goals:    STG's = LTG's by d/c   1. Patient will be Independent with all bed mobility in order to prep for out of bed mobility. (Ongoing)  2. Patient will be Modified Independent with all transfers with the front wheeled walker without LOB in order to prep for gait training. (Ongoing)  3. Patient will be able to Independently ambulate 252ft with the front wheeled walker without LOB in order to simulate community ambulation. (Ongoing)     PT Assessment:  Patient's progress towards established goals: Patient making good progress this session. She increased ambulation distance to 250 feet with supervision with no loss of balance. Cues required for safe use of walker with mobility throughout session, as patient repeatedly attempting to mobilize without walker while in room.     Treatment plan: Continue plan of care     Subjective:   "I just feel a little weak because  I've been in the bed for a few days."   Patient/family/caregiver consent to therapy session is noted by the participation in the therapy session.    Pain:  At Rest: 0 /10  With Activity: 0/10  Location: N/A  Interventions: None required    OBJECTIVE:   Observation of Patient/Vital Signs:   Patient is in bed with telemetry  Patient's medical condition is appropriate for Physical therapy intervention at this time.    Vital Signs:  See flowsheet between start time and stop times for vitals taken during session     Oriented to: Oriented x4  Command following: Follows 1 step commands without difficulty  Alertness/Arousal: Appropriate responses to stimuli   Attention Span:Appears intact  Memory: Appears intact    Musculoskeletal and Balance Details:   Patient able to perform all transfers and ambulate with FWW and no loss of balance.          Bed Mobility:   Supine to Sit:   Supervision.   Cues for Log rolling., Cues for Sequencing., Increased time required to complete  Sit to Supine:   Supervision.   Cues for Sequencing.    Transfers:  Sit to Stand:  Supervision with Front wheeled walker.    Cues for Sequencing, Cues for Hand Placement  Stand to Sit:  Supervision.    Cues for Hand Placement    Locomotion:  LEVEL AMBULATION:  Distance: 225 feet   Assistance level:  Supervision  Device:  Front wheeled walker  Pattern:  Step through, Decreased cadence, Decreased clearance:  bilaterally  Gait train details:cues for maintaining close proximity to walker, cues for pacing    Other Treatment Interventions this session:   Therapeutic activity  Gait training  Patient/family/caregiver education     Education Provided:   TOPICS: role of physical therapy, plan of care, goals of therapy and safety with mobility and ADLs, benefits of activity, use of adaptive equipment, bed mobility with use of adaptive equipment or strategy, activity with nursing    Learner educated: Patient  Method: Explanation  Response to education: Verbalized  understanding    Patient Position at End of Treatment:   Supine, in bed, Needs in reach, Bed/chair alarm set and No distress    Team Communication:   Spoke to : RN/LPN - Gabby  Regarding: Pre-session re: patient status, Patient position at end of session, Patient participation with Therapy  Whiteboard updated: No  PT/PTA communication: via written note and verbal communication as needed.    Time of treatment:   Time Calculation  PT Received On: 11/25/17  Start Time: 1416  Stop Time: 1439  Time Calculation (min): 23 min    Duard Brady, LPTA

## 2017-11-25 NOTE — Progress Notes (Signed)
MOBILITY NOTE:     Precautions: Weight Bearing: Full weight bearing     Activity:  Ambulated in the hall x200 feet     Level of Assistance: Minimal assist (patient does 75% or more of the task)     Device(s) used: Wheeled walker     Positioning frequency: Turns self as needed    Last Vitals: BP 128/57   Pulse 95   Temp 99.3 F (37.4 C) (Oral)   Resp 18   Ht 1.524 m (5')   Wt 67.9 kg (149 lb 9.6 oz)   SpO2 91%   BMI 29.22 kg/m     Patient needed few verbal ques/reminders to stay close to her FWW when ambulating. Patient is now in bed, call bell within reach, bed alarm on, bedside table within reach, patient denies any further needs at this time

## 2017-11-26 NOTE — Plan of Care (Signed)
Problem: Moderate/High Fall Risk Score >5  Goal: Patient will remain free of falls  Outcome: Progressing   11/25/17 0255   OTHER   Moderate Risk (6-13) LOW-Fall Interventions Appropriate for Low Fall Risk;LOW-Anticoagulation education for injury risk;MOD-Initiate Yellow "Fall Risk" magnet communication tool;MOD-(VH Only) Yellow "Fall Risk" signage;MOD-(VH Only) Yellow slippers;MOD-(VH Only) Apply yellow "Fall Risk" arm band;MOD-Consider activation of bed alarm if appropriate;MOD-Apply bed exit alarm if patient is confused;MOD-(VH Only) Place "Reset Bed Alarm" sign above bed if in use;MOD-Use of chair-pad alarm when appropriate;MOD-Place Fall Risk level on whiteboard in room       Problem: Compromised Hemodynamic Status  Goal: Vital signs and fluid balance maintained/improved  Outcome: Progressing   11/25/17 1826   Goal/Interventions addressed this shift   Vital signs and fluid balance are maintained/improved Monitor intake and output. Notify LIP if urine output is less than 30 mL/hour.;Monitor/assess lab values and report abnormal values;Monitor/assess vitals and hemodynamic parameters with position changes       Problem: Inadequate Tissue Perfusion  Goal: Adequate tissue perfusion will be maintained  Outcome: Progressing   11/25/17 1826   Goal/Interventions addressed this shift   Adequate tissue perfusion will be maintained Monitor/assess vital signs;Monitor/assess lab values and report abnormal values;Monitor intake and output       Comments: Assumed care of pt at 1900. Pt up to bathroom with one assist and walker. PT having no pain at this time. VSS. Assessment done. Bed alarm is on and call bell is within reach. Will continue to monitor.

## 2017-11-26 NOTE — Progress Notes (Signed)
11/26/17 1245   CM Review   CM Comments DCP: 11/26/17:  following for post acute care: patient is a resident at ALF: and is planned to return there, patient is not medically ready for D/C continues to IV ABTs, will monitor for medial readiness              [facility] LPN /DCP   [id number]

## 2017-11-26 NOTE — Progress Notes (Addendum)
Cardiology Progress Note      Date Time: 11/26/17 8:24 AM  Patient Name: Kelli Brown      Subjective:   All strips NSR  Feels better   Still slight cough  Up in Chair  Physical Exam:   Blood pressure 116/53, pulse 84, temperature 97.2 F (36.2 C), temperature source Oral, resp. rate 20, height 1.524 m (5'), weight 67.9 kg (149 lb 9.6 oz), SpO2 93 %.    Temp (24hrs), Avg:98.4 F (36.9 C), Min:97.2 F (36.2 C), Max:99.3 F (37.4 C)    Wt Readings from Last 1 Encounters:   11/22/17 67.9 kg (149 lb 9.6 oz)     Intake and Output Summary (Last 24 hours) at Date Time    Intake/Output Summary (Last 24 hours) at 11/26/17 0824  Last data filed at 11/26/17 0359   Gross per 24 hour   Intake            233.5 ml   Output              700 ml   Net           -466.5 ml       General appearance - alert, amazing for 96 in no distress  Neck - supple, no jvd  Chest - clear to auscultation anteriorly and posteriorly, no wheezes  Better air exchange  Heart - normal sinus rhythm by exam  As and Tr mumur  Abdomen - soft, nontender  Extremities - no edema  No calf tenderness  Has erythem at site of IV infiltration left arm    Medications:   Reviewed.    Labs:       Recent Labs  Lab 11/24/17  1221  11/22/17  1257   WBC 4.8 More results in Results Review 4.6   Hemoglobin 11.6* More results in Results Review 11.8*   Hematocrit 34.3* More results in Results Review 33.3*   PLT CT 149 More results in Results Review 138   PT INR  --   --  1.1   More results in Results Review = values in this interval not displayed.    Recent Labs  Lab 11/22/17  1257   Troponin I 0.01     B-Natriuretic Peptide   Date/Time Value Ref Range Status   11/22/2017 1257 146.6 (H) 0.0 - 100.0 pg/mL Final     Comment:     The above 1 analytes were performed by [facility] [phone])  [address],[place] [address]         Recent Labs  Lab 11/24/17  1221 11/23/17  0753   Glucose 69* 108*   BUN 27* 19   Creatinine 1.17 0.98   Sodium 136  137   Potassium 4.4 4.1   Chloride 101 103   CO2 29.0 24.8   AST (SGOT)  --  21   ALT  --  13   TSH  --  0.60     Cholesterol   Date/Time Value Ref Range Status   11/24/2017 1221 190 75 - 199 mg/dL Final     Triglycerides   Date/Time Value Ref Range Status   11/24/2017 1221 107 10 - 150 mg/dL Final     HDL   Date/Time Value Ref Range Status   11/24/2017 1221 61 45 - 65 mg/dL Final     LDL Calculated   Date/Time Value Ref Range Status   11/24/2017 1221 108 mg/dL Final  Rads:   Radiology: all results in the last 24 hours  No results found.    Cardiographics:       Telemetry: NSR    Assessment:   Active Problems:    Pneumonia  P afib---NSR on Amio  Aortic stenosis  Pulmonary hypertension    Plan:   Mobilize as tolerated  Incentive spirometry  Continue po Amio and Eliquis  Followup with me 4-6 weeks post discharge    Carlyon Shadow, MD

## 2017-11-26 NOTE — Plan of Care (Addendum)
NURSE NOTE SUMMARY     Patient Name: Kelli Brown   Attending Physician: Genevieve Norlander, MD   Primary Care Physician: Flossie Dibble, MD   Date of Admission:   11/22/2017   Today's date:   11/26/2017 LOS: 4 days   Shift Summary:                                                              0845: Assume care at 0700. Assessment complete, no s/s of distress noted. Pt denied any pain. All needs met. Ice pack applied to LFA site where IV was removed yesterday.   1830: Pt up to chair for all meals today. Able to achieve 1500 on IS. Alternating cold and warm compresses to LFA with improvement in edema and tenderness since this AM.    Provider Notifications:      Rapid Response Notifications:  Mobility:        PMP Activity: Step 5 - Chair (11/26/2017  6:30 PM)     Weight tracking:  Family Dynamic:     No data found.      Recent Vitals:  Active Problems:     BP 111/85   Pulse 92   Temp 97.5 F (36.4 C) (Oral)   Resp 20   Ht 1.524 m (5')   Wt 67.9 kg (149 lb 9.6 oz)   SpO2 94%   BMI 29.22 kg/m          Active Problems:    Pneumonia       Problem: Compromised Hemodynamic Status  Goal: Vital signs and fluid balance maintained/improved  Outcome: Progressing   11/26/17 1837   Goal/Interventions addressed this shift   Vital signs and fluid balance are maintained/improved Position patient for maximum circulation/cardiac output;Monitor/assess vitals and hemodynamic parameters with position changes;Monitor intake and output. Notify LIP if urine output is less than 30 mL/hour.;Monitor/assess lab values and report abnormal values       Problem: Infection  Goal: Free from infection  Outcome: Progressing   11/26/17 1837   OTHER   Free from infection  Assess for signs/symptoms of infection       Problem: Ineffective Airway Clearance  Goal: Airway is maintained  Outcome: Progressing   11/26/17 1837   Goal/Interventions addressed this shift   Airway is maintained  Teach/Reinforce use of incentive spirometer 10 times per hour  while awake, cough and deep breath as needed;Maintain oxygen level of greater than 92% or as ordered by LIP (reworded)

## 2017-11-26 NOTE — Progress Notes (Signed)
PROGRESS NOTE - [facility]     Patient Name: Kelli Brown,Kelli Brown WHITE LOS: 4 days   Attending Physician: Genevieve Norlander, MD   Primary Care Physician: Flossie Dibble, MD   Assessment and Plan:                                                            [facility]   Active Problem List  Active Problems:    Pneumonia    Plan  New onset afib rvr with hypotension  Currently in normal sinus rhythm  Amiodarone 200 mg daily  Apixaban 5 mg every 12  Appreciate Dr. Tressia Danas input    PNEUMONIA    Chronic interstitial lung disease   Rocephin 1 g IV daily  Azithromycin tablets  I will likely discharge these medications tomorrow  Albuterol /atvent nebs prn     Chronic diastolic CHF - compensated  Continue Lasix    Aortic valve stenosis  Not interested in any procedure   stable    Hypertension  Cont metoprolol     Hyperlipidemia  cont statins     Poor vision /macular degeneration  Pt was worried as she lost her glasses in the ER , notified nurse to Help find it     Nutrition: Regular Diet  GI Prophylaxis: not indicated  DVT Prophylaxis: low molecular weight heparin and SCD's while in bed  Code Status: DNR   Interval History and Objective:                         [facility]   CC: <principal problem not specified>    Kelli Brown is a 84 y.o. female who     - Patient is doing remarkably well today, however she does have some swelling from her left forearm yesterday which seems to be more hardened and indurated and so we will continue to monitor this over the next 24 hours however she denies any fevers or chills  - In regards to her lungs she seems to be doing fairly well and breathing without dependency on oxygen  - Spoke to the case manager in regards to this patient's current condition and would like to monitor her left forearm to ensure that it does not progress  - Patient can likely go back to [employer]  - In the meantime spoke to the nurse about a hot  cold compress cycle for the patient's forearm    ROS:As in HPI for 12 pt review of systems, otherwise negative.     Patient Vitals for the past 12 hrs:   BP Temp Pulse Resp   11/26/17 1151 128/60 - 93 20   11/26/17 0936 114/52 - - -   11/26/17 0934 - - 100 -   11/26/17 0729 116/53 97.2 F (36.2 C) 84 20   11/26/17 0359 90/55 97.9 F (36.6 C) - 16     Weight Monitoring 01/09/2017 01/10/2017 01/13/2017 05/04/2017 08/11/2017 11/22/2017 11/22/2017   Height 152.4 cm 152.4 cm 152.4 cm 154.9 cm 152.4 cm 160 cm 152.4 cm   Height Method - Stated - Stated - Stated Stated   Weight 70.308 kg 68.947 kg 68.9 kg 68.9 kg 70.852 kg 71 kg 67.858 kg   Weight Method - Stated -  Estimated - Stated Standing Scale   BMI (calculated) 30.3 kg/m2 29.7 kg/m2 29.7 kg/m2 28.8 kg/m2 30.6 kg/m2 27.8 kg/m2 29.3 kg/m2        Physical Exam   Constitutional: She appears well-nourished.   HENT:   Head: Normocephalic and atraumatic.   Eyes: Pupils are equal, round, and reactive to light. EOM are normal.   Neck: Normal range of motion. Neck supple.   Cardiovascular: Regular rhythm.    Murmur (Holosystolic) heard.  Pulmonary/Chest: Breath sounds normal. No respiratory distress. She has no wheezes.   Abdominal: Soft. Bowel sounds are normal.   Distended and obese   Musculoskeletal: Normal range of motion.   Neurological: She is alert.   Skin: Skin is warm.   Psychiatric: She has a normal mood and affect. Her behavior is normal.      Labs, Meds, Imaging, Etc. [facility]     Recent Labs  Lab 11/24/17  1221 11/23/17  0753   WBC 4.8 3.6*   RBC 3.55* 3.34*   Hemoglobin 11.6* 10.7*   Hematocrit 34.3* 32.4*   MCV 97 97   PLT CT 149 134       Recent Labs  Lab 11/22/17  1257   PT 10.7   PT INR 1.1   aPTT 27.9       Recent Labs  Lab 11/22/17  1257   Troponin I 0.01     No results found for: [id number]   Recent Labs  Lab 11/24/17  1221 11/23/17  0753 11/22/17  1257   Glucose 69* 108* 108*   Sodium 136 137 138   Potassium 4.4 4.1 3.9   Chloride 101 103 101    CO2 29.0 24.8 25.9   BUN 27* 19 17   Creatinine 1.17 0.98 1.19   EGFR 39* 49* 39*   Calcium 9.6 9.3 10.0       Recent Labs  Lab 11/23/17  0753 11/22/17  1257   Albumin 3.3* 4.0   Protein, Total 6.3 7.4   Bilirubin, Total 0.8 1.3*   Alkaline Phosphatase 56 68   ALT 13 16   AST (SGOT) 21 22       Recent Labs  Lab 11/22/17  1243   Specific Gravity, UR 1.015   pH, Urine 7.0   Protein, UR 30*   Glucose, UA Negative   Ketones UA 20*   Bilirubin, UA Negative   Blood, UA Moderate*   Nitrite, UA Negative   Urobilinogen, UA Normal   Leukocyte Esterase, UA Negative   WBC, UA <1   RBC, UA 24*   Bacteria, UA Rare*      Estimated Creatinine Clearance: 24.2 mL/min (based on SCr of 1.17 mg/dL).   Scheduled Meds:  Current Facility-Administered Medications   Medication Dose Route Frequency   . amiodarone  200 mg Oral Daily   . apixaban  5 mg Oral Q12H SCH   . calcium-vitamin D  2 tablet Oral Daily   . cefTRIAXone  1 g Intravenous Q24H   . docusate sodium  100 mg Oral Daily   . dorzolamide  1 drop Left Eye TID   . furosemide  40 mg Oral Daily   . guaiFENesin  600 mg Oral Q12H SCH   . lactobacillus species  50 Billion CFU Oral Daily   . metoprolol succinate XL  50 mg Oral QAM   . potassium chloride  20 mEq Oral QAM   . pravastatin  40 mg Oral QAM   . senna-docusate  2 tablet Oral QHS   . sodium chloride (PF)  3 mL Intravenous Q8H   . sodium chloride  1,000 mL Intravenous Once   . vitamin D  1,000 Units Oral QAM    PRN Meds: acetaminophen **OR** acetaminophen **OR** acetaminophen, acetaminophen, albuterol, guaiFENesin, naloxone, zolpidem.      Patient Lines/Drains/Airways Status    Active PICC Line / CVC Line / PIV Line / Drain / Airway / Intraosseous Line / Epidural Line / ART Line / Line / Wound / Pressure Ulcer / NG/OG Tube     Name:   Placement date:   Placement time:   Site:   Days:    Peripheral IV 11/25/17 Right Forearm  11/25/17    1347    Forearm    less than 1               Imagine reviewed and Significant for:  Xr Chest 2  Views    Result Date: 11/22/2017  Lung findings suggest chronic interstitial changes. Superimposed mild asymmetrical right pulmonary edema is difficult to exclude. COPD. Questionable Minimal bibasilar pleural effusion/thickening ReadingStation:[id number]     I have spent 38 minutes with the patient discussing the principle problem (listed above), the active hospital problems (listed above), discharge planning issues, the results of radiology tests and the results of laboratory tests.   Greater than 50% of the time was spent counseling the patient and coordinating care with Nursing staff (RN/LPN).              MRN: [id number]           CSN: [id number]  DOB: 04-17-1919   Genevieve Norlander, MD  11/26/17 2:41 PM  [facility] Pager  -  [phone]  [facility] Mobile  -  [address]  [address]  [place], [place]  [phone]

## 2017-11-26 NOTE — OT Progress Note (Signed)
[facility]: [facility]  Department of Rehabilitation Services: [phone]    Kelli Brown    CSN#: [id number]  GI/ENDO/GEN MED 506/506-A    Occupational Therapy Progress Note    Patient's medical condition is appropriate for Occupational therapy intervention at this time.    Time of treatment:   Time Calculation  OT Received On: 11/26/17  Start Time: 1500  Stop Time: 1529  Time Calculation (min): 29 min    Visit#: 2    Medical Diagnosis/Pertinent medical/surgical details:malaise, fever, cough, and loss of appetite for the last 2-3 days. Patient noted that at her baseline she was Independent with all ADL's, functional mobility, and was able to Independently ambulate community distances with a front wheeled walker. Patient currently requires      Precautions and Contraindications:   Falls  Mobility protocol     Assessment:   Patient's progress towards established goals: Pt demos good progress, meeting 1 STG for LB dressing. During the initial evaluation, pt noted that she has been having difficulty with her socks and shoes and therefore pt practiced using the sock aid and reacher for donning/doffing socks this session; pt able to doff bilateral socks with supervision but required assistance to thread socks on sock aid. Pt demos good activity tolerance as indicated by her ability to participate actively for ~30 min without rest breaks.     Patient continues to have the following impairments: decreased strength, balance deficits, decreased independence with ADLs, decreased independence with IADLs, decreased activity tolerance, decrease safety awareness, decreased functional mobility, decreased functional transfers    Patient will continue to benefit from skilled OT services in order to address the impairments noted above.        Goals:   STG in 2 to 3 visits:    1. Patient will donn/doff socks with Minimal assist and use of AE/AT prn. ONGOING  2. Patient will thread pants/undergarments through legs  and over knees with Minimal assist and use of AE/AT/AD prn. MET (11/26/17)  3. Patient will perform 2-3 grooming tasks standing with FWW at sink with Supervision and use of AE/AT prn. ONGOING  4. Patient will demonstrate increased safety awareness by requiring no more than 2 verbal cues for proper walker management and hand placement during functional mobility/transfer tasks.  ONGOING    LTG (by d/c):     1. Patient will be Supervision for ADLs, Supervision for functional transfers/mobility with use of AD/DME/AT/AE. ONGOING       Plan:   Treatment/interventions: ADL retraining, functional transfer training, UE strengthening/ROM, cognition (safety awareness, problem solving, etc.)    Treatment Frequency: OT Frequency Recommended: 2-3x/wk     DISCHARGE RECOMMENDATIONS   DME recommended for Discharge:   Reacher  Sock aid    Discharge Recommendations:   ALF (Return to [facility])         Subjective:   "I can do that." Re: LB dressing   Patient is agreeable to participation in the therapy session.    Pain:  At Rest: 0 /10  With Activity: 0/10  Location: N/A  Interventions: None required    Objective:   Observation of Patient:    Patient is in bed with Bed/chair alarm on, ice pack to L forearm and UE elevated on pillow    Edema: L forearm  Skin Inspection: redness L forearm      Vital Signs:   Stable with no signs/symptoms of distress     Oriented to: Oriented x4  Command following: Follows  multi-step commands without difficulty, 100% of the time  Safety Awareness: minimal verbal instruction  Behavior: Cooperative    Balance:  Static Sitting:  Good  Dynamic Sitting:  Good  Static Standing:  Good  Dynamic Standing:  Fair+    Activities of Daily Living:   LB Dressing: Moderate assist only to  [facility] R sock, don L sock using sock aid; Supervision to doff socks with reacher; Min A to to thread LLE into pants, Supervision to thread RLE into pants; Supervision to pull up over hips, pull down from hips,performed, seated in a  chair  TOILETING:  Supervision for clothing management up, clothing management down, perineal hygiene; standard toilet, grab bar use      Functional Mobility:   Bed Mobility:  Supine to Sit:   Supervision.        Sit to Supine:   Supervision.          Transfers:  Sit to Stand:  Supervision with No assistive device.        Stand to Sit:  Supervision.        Toilet: Minimal assist with Hand held assist, Grab Bar left.   Cues for Hand Placement    Participation and Activity Tolerance   Participation effort: Excellent  Activity Tolerance: Tolerates 30 minutes of activity without rest breaks          Education Provided:   Topics: Role of occupational therapy, plan of care, goals of therapy and safety with mobility and ADLs, use of adaptive equipment, activity with nursing.    Individuals educated: Patient.  Method: Explanation and Demonstration.  Response to education: Verbalized understanding and Demonstrated understanding.    Team Communication:   OT communicated with: RN/LPN Joyice Faster  OT communicated regarding: Pre-session re: patient status, Patient position at end of session  OT/COTA communication: via written note and verbal communication as needed.    Supine, in bed, in the room, Needs in reach, Bed/chair alarm set, No distress and LUE elevated and ice pack reapplied     Recommend client participate in self-care tasks as tolerated with FWW assistive device and assist x1, OOB to chair for meals outside of and in addition to OT session.    Elsie Ra, OTR/L

## 2017-11-26 NOTE — PT Progress Note (Signed)
[facility]: [facility]  Department of Rehabilitation Services: [phone]  Kelli Brown    CSN: [id number]    GI/ENDO/GEN MED   506/506-A    Physical Therapy Treatment Note    Time of treatment:   Time Calculation  PT Received On: 11/26/17  Start Time: 0847  Stop Time: 0908  Time Calculation (min): 21 min    Visit#: 3    Last seen by Physical therapist vs. PTA: 11/26/17    Medical Diagnosis/Pertinent medical/surgical details: Hypoxia [R09.02]  Febrile illness [R50.9]  Generalized weakness [R53.1]  Pneumonia of left lower lobe due to infectious organism [J18.1]    Precautions and Contraindications:  Falls  Mobility protocol     Assessment:   Patient's progress towards established goals: Patient displayed a increase in activity tolerance compared to last treatment session by being able to participate in bed mobility and transfers with Modified Independence. Patient was also able to participate in gait training for a longer distance, 216ft with the front wheeled walker and Supervision assistance while on room air. Patient can continue to benefit from skilled acute care PT services in order to address the noted impairments below. PT is recommending that patient can return to her ALF, [name] with PT services in order to help her maximize her functional Independence.     Patient continues to have the following impairments: decreased strength, decreased safety/judgement during functional mobility, decreased activity tolerance, decreased functional mobility, decreased balance, gait deficits       Goals:     STG's = LTG's by d/c   1. Patient will be Independent with all bed mobility in order to prep for out of bed mobility. (Ongoing)  2. Patient will be Modified Independent with all transfers with the front wheeled walker without LOB in order to prep for gait training. MET  3. Patient will be able to Independently ambulate 260ft with the front wheeled walker without LOB in order to simulate community  ambulation. (Ongoing)      Plan:   Treatment/interventions: Gait training, Functional transfer training, Patient/caregiver training, Bed mobility    Treatment Frequency: 3-4x/wk    DISCHARGE RECOMMENDATIONS   DME recommended for Discharge:   None  Has needed equipment    Discharge Recommendations:   ALF         Subjective:   Patient noted that she was feeling better and was willing to try to participate with therapy    Patient is agreeable to participation in the therapy session. Nursing clears patient for therapy.    Pain:  At Rest: 0 /10  With Activity: 0/10  Location: N/A  Interventions: None required    OBJECTIVE:   Observation of Patient/Vital Signs:   Patient is in bed with telemetry, O2 at 2 liters/minute via nasal cannula  Patient's medical condition is appropriate for Physical therapy intervention at this time.    Vital Signs:  BP Supine:  127/60 mmHg  HR Supine: 84 bpm  SpO2 at rest: 99% 2 Liters   SpO2 with activity: 92-96% Room Air     Edema: None noted throughout bilateral UE/LE's  Skin Inspection: Bruising throughout bilateral UE's, and Redness Left arm    Oriented to: Oriented x4  Command following: Follows 1 step commands without difficulty  Alertness/Arousal: Appropriate responses to stimuli   Attention Span:Appears intact  Memory: Appears intact  Safety Awareness: minimal verbal instruction  Insights: Educated in safety awareness    Musculoskeletal and Balance Details:   Balance:  Static Sitting:  WFL  Dynamic Sitting:  Good  Static Standing:  WFL  Dynamic Standing:  Good          Bed Mobility:   Supine to Sit:   Modified independence  to the left.   Cues for Sequencing., Cues for Hand placement., HOB elevated, Bed rail used  Seated Scooting:   Supervision    Transfers:  Sit to Stand:  Modified independence  with Front wheeled walker.         Stand to Sit:  Independent.           Locomotion:  LEVEL AMBULATION:  Distance: 264ftx1   Assistance level:  Supervision  Device:  Front wheeled  walker  Pattern:  Reciprocal, Decreased cadence, Decreased step length:  bilaterally, Mild forward flexed posture, and mild deviation of gait path. Patient was provided with Minimal verbal/tactile cues in order to promote more erect standing balance/posture and to stay within the walker while ambulating.      Participation and Activity Tolerance   Participation effort: Good  Activity Tolerance: Tolerates 10-20 minutes of activity with multiple rests    Other Treatment Interventions this session:   Therapeutic activity  Gait training  Patient/family/caregiver education     Education Provided:   TOPICS: role of physical therapy, plan of care, goals of therapy and benefits of activity    Learner educated: Patient, Nursing Staff  Method: Explanation and Demonstration  Response to education: Verbalized understanding and Demonstrated understanding    Patient Position at End of Treatment:   Sitting, in bed, in the room, Needs in reach and No distress    Team Communication:   Spoke to : RN/LPN - Gabby  Regarding: Pre-session re: patient status, Patient position at end of session, Patient participation with Therapy, Vital signs, Further recommendations  Whiteboard updated: No  PT/PTA communication: via written note and verbal communication as needed.      Recommend patient up and ambulate and up to the bedside chair for all meals with the front wheeled walker and Ax1 as tolerated outside of PT sessions.    Johnnette Barrios, PT, DPT

## 2017-11-27 LAB — CBC
Hematocrit: 31.9 % — ABNORMAL LOW (ref 36.0–48.0)
Hemoglobin: 11 gm/dL — ABNORMAL LOW (ref 12.0–16.0)
MCH: 33 pg (ref 28–35)
MCHC: 35 gm/dL (ref 32–36)
MCV: 96 fL (ref 80–100)
MPV: 7.9 fL (ref 6.0–10.0)
PLT CT: 163 10*3/uL (ref 130–440)
RBC: 3.31 10*6/uL — ABNORMAL LOW (ref 3.80–5.00)
RDW: 16.9 % — ABNORMAL HIGH (ref 11.0–14.0)
WBC: 6.9 10*3/uL (ref 4.0–11.0)

## 2017-11-27 MED ORDER — AMIODARONE HCL 200 MG PO TABS
100.00 mg | ORAL_TABLET | Freq: Every day | ORAL | 0 refills | Status: DC
Start: 2017-11-28 — End: 2017-11-27

## 2017-11-27 MED ORDER — FUROSEMIDE 40 MG PO TABS
40.00 mg | ORAL_TABLET | Freq: Every day | ORAL | 0 refills | Status: DC
Start: 2017-11-28 — End: 2018-02-09

## 2017-11-27 MED ORDER — FUROSEMIDE 40 MG PO TABS
40.00 mg | ORAL_TABLET | Freq: Every day | ORAL | 0 refills | Status: DC
Start: 2017-11-28 — End: 2017-11-27

## 2017-11-27 MED ORDER — AMIODARONE HCL 100 MG PO TABS
100.00 mg | ORAL_TABLET | Freq: Every day | ORAL | 0 refills | Status: DC
Start: 2017-11-28 — End: 2018-02-09

## 2017-11-27 MED ORDER — APIXABAN 5 MG PO TABS
5.00 mg | ORAL_TABLET | Freq: Two times a day (BID) | ORAL | 0 refills | Status: DC
Start: 2017-11-27 — End: 2018-02-09

## 2017-11-27 MED ORDER — APIXABAN 5 MG PO TABS
5.00 mg | ORAL_TABLET | Freq: Two times a day (BID) | ORAL | 0 refills | Status: DC
Start: 2017-11-27 — End: 2017-11-27

## 2017-11-27 NOTE — Discharge Summary (Signed)
DISCHARGE SUMMARY - [facility]     Patient Name: [facility] WHITE   Attending Physician: Genevieve Norlander, MD   Primary Care Physician: Flossie Dibble, MD   Date of Admission:   11/22/2017   Date of Discharge:   11/27/2017 LOS: 5 days   Admission Assessment and Plan (Copied from H&P)   Discharge Diagnoses:   PNEUMONIA - fever , malaise , cough , left side opacity on cxr   Chronic interstitial  lung disease   Admit to medicla floor   Monitor vitals   Empiric abx ceftraixone and azithro   Influenza negative   Appears to have bronchospasm with cough -  Will add prednisone   Albuterol /atvent nebs prn       chf- appears well compensated -  Cont home dose of lasix     Aortic valve stenosis -   Not interested in any procedure     htn-  Cont metoprolol     Hyperlipidemia - cont statins     Poor vision /macular degeneration -   Pt was worried as she lost her glasses in the ER , notified nurse to  Help find it      Code -  Pt wants to be DNR ,   Supportive care    Active Problems:    Pneumonia  Resolved Problems:    * No resolved hospital problems. [facility] Course     New onset afib rvr with hypotension  Currently in normal sinus rhythm  Amiodarone 200 mg daily - decreased this to 100mg  daily  Apixaban 5 mg every 12  Appreciate Dr. Tressia Danas input    PNEUMONIA   Chronic interstitial lung disease   Rocephin 1 g IV daily  Azithromycin tablets  I will likely discharge these medications tomorrow  Albuterol /atvent nebs prn     Chronic diastolic CHF - compensated  Continue Lasix daily    Aortic valve stenosis  Not interested in any procedure   stable    Hypertension  Cont metoprolol     Hyperlipidemia  cont statins     Poor vision /macular degeneration  Pt was worried as she lost her glasses in the ER , notified nurse to Help find it     Nutrition: Regular Diet  GI Prophylaxis:not indicated  DVT Prophylaxis: low molecular weight heparin and SCD's while in bed  Code Status: DNR  Discharge  Condition: good   Discharge Instructions:        Disposition:  home  Diet: Cardiac Diet  Activity: As tolerated  Discharge Code Status: NO CPR - SUPPORT OK Flossie Dibble, MD  [address]  [place] [address]  [phone]    Follow up       Discharge Medications:                                                                      Discharge Medication List      Taking    amiodarone 100 MG tablet  Dose:  100 mg  Commonly known as:  PACERONE  Start taking on:  11/28/2017  Take 1 tablet (100 mg total) by mouth daily.     apixaban 5 MG  Dose:  5 mg  Commonly known as:  ELIQUIS  Take 1 tablet (5 mg total) by mouth every 12 (twelve) hours.     aspirin EC 81 MG EC tablet  Dose:  81 mg  Take 81 mg by mouth every morning.     CALTRATE 600+D PO  Dose:  1 tablet  Take 1 tablet by mouth every morning.     CENTRUM SILVER 50+WOMEN PO  Dose:  1 tablet  Take 1 tablet by mouth every morning.     dorzolamide 2 % ophthalmic solution  Dose:  1 drop  Commonly known as:  TRUSOPT  Place 1 drop into the left eye 3 (three) times daily.     furosemide 40 MG tablet  Dose:  40 mg  What changed:   when to take this   additional instructions  Commonly known as:  LASIX  Start taking on:  11/28/2017  Take 1 tablet (40 mg total) by mouth daily.     metoprolol succinate XL 50 MG 24 hr tablet  Dose:  50 mg  Commonly known as:  TOPROL-XL  Take 50 mg by mouth every morning.     Mirabegron ER 25 MG Tb24  Dose:  1 tablet  Take 1 tablet by mouth every morning.     potassium chloride 20 MEQ Tbcr  Dose:  20 mEq  Take 20 mEq by mouth every morning.     pravastatin 40 MG tablet  Dose:  40 mg  Commonly known as:  PRAVACHOL  Take 40 mg by mouth every morning.     TYLENOL 500 MG tablet  Dose:  1000 mg  Generic drug:  acetaminophen  Take 1,000 mg by mouth as needed for Pain.     vitamin D 1000 UNIT tablet  Dose:  1000 Units  Commonly known as:  cholecalciferol  Take 1,000 Units by mouth every morning.           Treatment Team       Treatment Team:   Attending  Provider: Genevieve Norlander, MD  Consulting Physician: Carlyon Shadow, MD   Procedures/Radiology performed:      XR CHEST 2 VIEWS     Discharge Day Exam (11/27/2017):   Physical Exam   Constitutional: She appears well-nourished.   HENT:   Head: Normocephalic and atraumatic.   Eyes: Pupils are equal, round, and reactive to light. EOM are normal.   Neck: Normal range of motion. Neck supple.   Cardiovascular: Regular rhythm and normal heart sounds.    Pulmonary/Chest: Breath sounds normal. No respiratory distress. She has no wheezes.   Abdominal: Soft. Bowel sounds are normal.   Musculoskeletal: Normal range of motion.   Neurological: She is alert.   Skin: Skin is warm.   Psychiatric: She has a normal mood and affect. Her behavior is normal.    Temp:  [97.3 F (36.3 C)-98.1 F (36.7 C)] 97.5 F (36.4 C)  Heart Rate:  [77-92] 84  Resp Rate:  [18-20] 20  BP: (92-131)/(39-85) 110/52  Wt Readings from Last 1 Encounters:   11/22/17 67.9 kg (149 lb 9.6 oz)      RECENT LABS (from the last 7 days)      Recent Labs  Lab 11/27/17  1147 11/24/17  1221   WBC 6.9 4.8   RBC 3.31* 3.55*   Hemoglobin 11.0* 11.6*   Hematocrit 31.9* 34.3*   MCV 96 97   PLT CT 163 149       Recent Labs  Lab 11/22/17  1257   PT 10.7   PT INR 1.1   aPTT 27.9       Recent Labs  Lab 11/22/17  1257   Troponin I 0.01     No results found for: [id number]   Recent Labs  Lab 11/24/17  1221 11/23/17  0753 11/22/17  1257   Glucose 69* 108* 108*   Sodium 136 137 138   Potassium 4.4 4.1 3.9   Chloride 101 103 101   CO2 29.0 24.8 25.9   BUN 27* 19 17   Creatinine 1.17 0.98 1.19   EGFR 39* 49* 39*   Calcium 9.6 9.3 10.0       Recent Labs  Lab 11/23/17  0753 11/22/17  1257   Albumin 3.3* 4.0   Protein, Total 6.3 7.4   Bilirubin, Total 0.8 1.3*   Alkaline Phosphatase 56 68   ALT 13 16   AST (SGOT) 21 22       Recent Labs  Lab 11/22/17  1243   Specific Gravity, UR 1.015   pH, Urine 7.0   Protein, UR 30*   Glucose, UA Negative   Ketones UA 20*   Bilirubin, UA  Negative   Blood, UA Moderate*   Nitrite, UA Negative   Urobilinogen, UA Normal   Leukocyte Esterase, UA Negative   WBC, UA <1   RBC, UA 24*   Bacteria, UA Rare*      Allergies:      Codeine; Darvon [propoxyphene]; and Phenobarbital   Time spent on discharging the patient:  38 minutes   MRN: [id number]  CSN: [id number]  DOB: Aug 09, 1919   Genevieve Norlander, MD  11/27/17 4:10 PM  [facility] Pager  -  [phone]  [facility]  -  [address]  [address]  [place], [place]  [phone]

## 2017-11-27 NOTE — Plan of Care (Addendum)
Assumed care of pt at 1900. Pt is resting in bed with bed alarm on. Pt denied pain. Pt educated on use of call bell. Pt demonstrates proper use of call bell, within reach. Pt shows no sxs of distress. Pt denied any additional needs. Will continue to monitor pt.   2100: pt is resting in bed with regular respirations.  2300: pt bed alarm does not work. Ordered new bed. Replaced bed. Bed alarm working now.  0037: pt coughing. Gave prn coughing medication.  0400: pt is resting in bed with regular respirations.  Problem: Moderate/High Fall Risk Score >5  Goal: Patient will remain free of falls  Outcome: Progressing   11/22/17 1248 11/25/17 0255   OTHER   Moderate Risk (6-13) --  LOW-Fall Interventions Appropriate for Low Fall Risk;LOW-Anticoagulation education for injury risk;MOD-Initiate Yellow "Fall Risk" magnet communication tool;MOD-([facility]H Only) Yellow "Fall Risk" signage;MOD-([facility] Only) Yellow slippers;MOD-([facility] Only) Apply yellow "Fall Risk" arm band;MOD-Consider activation of bed alarm if appropriate;MOD-Apply bed exit alarm if patient is confused;MOD-(V[facility] Only) Place "Reset Bed Alarm" sign above bed if in use;MOD-Use of chair-pad alarm when appropriate;MOD-Place Fall Risk level on whiteboard in room   High (Greater than 13) MOD-Remain with patient during toileting;MOD-Re-orient confused patients;HIGH-Apply yellow "Fall Risk" arm band;HIGH-(V[facility] Only) Yellow slippers;HIGH-(V[facility] Only) Place red fall prevention sign on door/chart --

## 2017-11-27 NOTE — Plan of Care (Signed)
Problem: Moderate/High Fall Risk Score >5  Goal: Patient will remain free of falls  Outcome: Progressing   11/27/17 1530   OTHER   Moderate Risk (6-13) LOW-Fall Interventions Appropriate for Low Fall Risk;MOD-([facility] Only) Yellow "Fall Risk" signage;MOD-([facility] Only) Yellow slippers;MOD-([facility] Only) Apply yellow "Fall Risk" arm band;MOD-Consider activation of bed alarm if appropriate;MOD-(VH Only) Place "Reset Bed Alarm" sign above bed if in use       Problem: Compromised Hemodynamic Status  Goal: Vital signs and fluid balance maintained/improved  Outcome: Progressing   11/27/17 1530   Goal/Interventions addressed this shift   Vital signs and fluid balance are maintained/improved Monitor/assess vitals and hemodynamic parameters with position changes;Monitor and compare daily weight;Monitor intake and output. Notify LIP if urine output is less than 30 mL/hour.;Monitor/assess lab values and report abnormal values;Position patient for maximum circulation/cardiac output       Problem: Infection  Goal: Free from infection  Outcome: Progressing   11/27/17 1530   OTHER   Free from infection      Assess for signs/symptoms of infection;Assess immunization status     Assumed care of patient at 0715 hrs. Vital signs are stable. Assessment completed. Denies any pain or discomfort.  Is being discharged this pm. Report given to oncoming nurse.      Problem: Ineffective Airway Clearance  Goal: Airway is maintained  Outcome: Progressing   11/27/17 1530   Goal/Interventions addressed this shift   Airway is maintained  Teach/Reinforce use of incentive spirometer 10 times per hour while awake, cough and deep breath as needed;Maintain oxygen level of greater than 92% or as ordered by LIP (reworded);Reposition patient every 2 hours and as needed unless able to self-reposition

## 2018-02-09 ENCOUNTER — Emergency Department: Payer: Medicare Other

## 2018-02-09 ENCOUNTER — Observation Stay
Admission: EM | Admit: 2018-02-09 | Discharge: 2018-02-10 | Disposition: A | Discharge status: Home / Self Care | Payer: Medicare Other | Attending: Emergency Medicine | Admitting: Emergency Medicine

## 2018-02-09 DIAGNOSIS — I10 Essential (primary) hypertension: Secondary | ICD-10-CM

## 2018-02-09 DIAGNOSIS — E785 Hyperlipidemia, unspecified: Secondary | ICD-10-CM

## 2018-02-09 DIAGNOSIS — I110 Hypertensive heart disease with heart failure: Secondary | ICD-10-CM | POA: Insufficient documentation

## 2018-02-09 DIAGNOSIS — R079 Chest pain, unspecified: Secondary | ICD-10-CM

## 2018-02-09 DIAGNOSIS — Z7982 Long term (current) use of aspirin: Secondary | ICD-10-CM | POA: Insufficient documentation

## 2018-02-09 DIAGNOSIS — Z7901 Long term (current) use of anticoagulants: Secondary | ICD-10-CM | POA: Insufficient documentation

## 2018-02-09 DIAGNOSIS — R0789 Other chest pain: Principal | ICD-10-CM | POA: Insufficient documentation

## 2018-02-09 DIAGNOSIS — I509 Heart failure, unspecified: Secondary | ICD-10-CM | POA: Insufficient documentation

## 2018-02-09 DIAGNOSIS — Z79899 Other long term (current) drug therapy: Secondary | ICD-10-CM | POA: Insufficient documentation

## 2018-02-09 DIAGNOSIS — I350 Nonrheumatic aortic (valve) stenosis: Secondary | ICD-10-CM | POA: Insufficient documentation

## 2018-02-09 DIAGNOSIS — I4891 Unspecified atrial fibrillation: Secondary | ICD-10-CM | POA: Insufficient documentation

## 2018-02-09 LAB — CBC AND DIFFERENTIAL
Basophils %: 1.2 % (ref 0.0–3.0)
Basophils Absolute: 0.1 10*3/uL (ref 0.0–0.3)
Eosinophils %: 1.7 % (ref 0.0–7.0)
Eosinophils Absolute: 0.1 10*3/uL (ref 0.0–0.8)
Hematocrit: 33.7 % — ABNORMAL LOW (ref 36.0–48.0)
Hemoglobin: 11 gm/dL — ABNORMAL LOW (ref 12.0–16.0)
Lymphocytes Absolute: 1.2 10*3/uL (ref 0.6–5.1)
Lymphocytes: 28.8 % (ref 15.0–46.0)
MCH: 33 pg (ref 28–35)
MCHC: 33 gm/dL (ref 32–36)
MCV: 101 fL — ABNORMAL HIGH (ref 80–100)
MPV: 7.3 fL (ref 6.0–10.0)
Monocytes Absolute: 0.6 10*3/uL (ref 0.1–1.7)
Monocytes: 13.7 % (ref 3.0–15.0)
Neutrophils %: 54.6 % (ref 42.0–78.0)
Neutrophils Absolute: 2.4 10*3/uL (ref 1.7–8.6)
PLT CT: 197 10*3/uL (ref 130–440)
RBC: 3.35 10*6/uL — ABNORMAL LOW (ref 3.80–5.00)
RDW: 17.3 % — ABNORMAL HIGH (ref 11.0–14.0)
WBC: 4.3 10*3/uL (ref 4.0–11.0)

## 2018-02-09 LAB — BASIC METABOLIC PANEL
Anion Gap: 14.2 mMol/L (ref 7.0–18.0)
BUN / Creatinine Ratio: 14.7 Ratio (ref 10.0–30.0)
BUN: 23 mg/dL — ABNORMAL HIGH (ref 7–22)
CO2: 28.6 mMol/L (ref 20.0–30.0)
Calcium: 9.8 mg/dL (ref 8.5–10.5)
Chloride: 102 mMol/L (ref 98–110)
Creatinine: 1.56 mg/dL — ABNORMAL HIGH (ref 0.60–1.20)
EGFR: 28 mL/min/{1.73_m2} — ABNORMAL LOW (ref 60–150)
Glucose: 138 mg/dL — ABNORMAL HIGH (ref 71–99)
Osmolality Calc: 287 mOsm/kg (ref 275–300)
Potassium: 3.8 mMol/L (ref 3.5–5.3)
Sodium: 141 mMol/L (ref 136–147)

## 2018-02-09 LAB — TROPONIN I
Troponin I: <0.01 ng/mL (ref 0.00–0.02)
Troponin I: <0.01 ng/mL (ref 0.00–0.02)
Troponin I: 0.01 ng/mL (ref 0.00–0.02)

## 2018-02-09 MED ORDER — PRAVASTATIN SODIUM 20 MG PO TABS
40.00 mg | ORAL_TABLET | Freq: Every evening | ORAL | Status: DC
Start: 2018-02-09 — End: 2018-02-09

## 2018-02-09 MED ORDER — APIXABAN 5 MG PO TABS
2.50 mg | ORAL_TABLET | Freq: Two times a day (BID) | ORAL | Status: DC
Start: 2018-02-09 — End: 2018-02-10
  Administered 2018-02-09 – 2018-02-10 (×2): 2.5 mg via ORAL
  Filled 2018-02-09 (×2): qty 1

## 2018-02-09 MED ORDER — ACETAMINOPHEN 325 MG PO TABS
650.00 mg | ORAL_TABLET | ORAL | Status: DC | PRN
Start: 2018-02-09 — End: 2018-02-10

## 2018-02-09 MED ORDER — SODIUM CHLORIDE 0.9 % IJ SOLN
0.40 mg | INTRAMUSCULAR | Status: DC | PRN
Start: 2018-02-09 — End: 2018-02-10

## 2018-02-09 MED ORDER — PRAVASTATIN SODIUM 20 MG PO TABS
40.00 mg | ORAL_TABLET | Freq: Every day | ORAL | Status: DC
Start: 2018-02-09 — End: 2018-02-10
  Administered 2018-02-10: 09:00:00 40 mg via ORAL
  Filled 2018-02-09: qty 2

## 2018-02-09 MED ORDER — VH POTASSIUM CHLORIDE CRYS ER 20 MEQ PO TBCR (WRAP)
20.00 meq | EXTENDED_RELEASE_TABLET | Freq: Every morning | ORAL | Status: DC
Start: 2018-02-10 — End: 2018-02-10
  Filled 2018-02-09: qty 1

## 2018-02-09 MED ORDER — ACETAMINOPHEN 650 MG RE SUPP
650.00 mg | RECTAL | Status: DC | PRN
Start: 2018-02-09 — End: 2018-02-10

## 2018-02-09 MED ORDER — ASPIRIN 81 MG PO CHEW
CHEWABLE_TABLET | ORAL | Status: AC
Start: 2018-02-09 — End: ?
  Filled 2018-02-09: qty 2

## 2018-02-09 MED ORDER — METOPROLOL SUCCINATE ER 50 MG PO TB24
50.00 mg | ORAL_TABLET | Freq: Every morning | ORAL | Status: DC
Start: 2018-02-10 — End: 2018-02-10
  Administered 2018-02-10: 07:00:00 50 mg via ORAL
  Filled 2018-02-09: qty 1

## 2018-02-09 MED ORDER — SODIUM CHLORIDE 0.9 % IJ SOLN
3.00 mL | Freq: Three times a day (TID) | INTRAMUSCULAR | Status: DC
Start: 2018-02-09 — End: 2018-02-10
  Administered 2018-02-10: 06:00:00 3 mL via INTRAVENOUS

## 2018-02-09 MED ORDER — NITROGLYCERIN 0.4 MG SL SUBL
0.40 mg | SUBLINGUAL_TABLET | SUBLINGUAL | Status: DC | PRN
Start: 2018-02-09 — End: 2018-02-10
  Administered 2018-02-09: 21:00:00 0.4 mg via SUBLINGUAL
  Filled 2018-02-09: qty 25

## 2018-02-09 MED ORDER — ASPIRIN 81 MG PO CHEW
162.00 mg | CHEWABLE_TABLET | Freq: Once | ORAL | Status: AC
Start: 2018-02-09 — End: 2018-02-09
  Administered 2018-02-09: 13:00:00 162 mg via ORAL

## 2018-02-09 MED ORDER — ONDANSETRON HCL 4 MG/2ML IJ SOLN
4.00 mg | Freq: Three times a day (TID) | INTRAMUSCULAR | Status: DC | PRN
Start: 2018-02-09 — End: 2018-02-10

## 2018-02-09 MED ORDER — ASPIRIN 81 MG PO TBEC
81.00 mg | DELAYED_RELEASE_TABLET | Freq: Every morning | ORAL | Status: DC
Start: 2018-02-09 — End: 2018-02-10
  Administered 2018-02-10: 08:00:00 81 mg via ORAL
  Filled 2018-02-09: qty 1

## 2018-02-09 MED ORDER — FOLIC ACID 1 MG PO TABS
1.00 mg | ORAL_TABLET | Freq: Every day | ORAL | Status: DC
Start: 2018-02-10 — End: 2018-02-10
  Administered 2018-02-10: 09:00:00 1 mg via ORAL
  Filled 2018-02-09: qty 1

## 2018-02-09 MED ORDER — SODIUM CHLORIDE 0.9 % IV SOLN
INTRAVENOUS | Status: AC
Start: 2018-02-09 — End: 2018-02-10

## 2018-02-09 MED ORDER — AMIODARONE HCL 200 MG PO TABS
100.00 mg | ORAL_TABLET | Freq: Every day | ORAL | Status: DC
Start: 2018-02-09 — End: 2018-02-10
  Administered 2018-02-10: 09:00:00 100 mg via ORAL
  Filled 2018-02-09 (×2): qty 1

## 2018-02-09 MED ORDER — ACETAMINOPHEN 160 MG/5ML PO SOLN
650.00 mg | ORAL | Status: DC | PRN
Start: 2018-02-09 — End: 2018-02-10

## 2018-02-09 MED ORDER — ONDANSETRON 4 MG PO TBDP
4.00 mg | ORAL_TABLET | Freq: Three times a day (TID) | ORAL | Status: DC | PRN
Start: 2018-02-09 — End: 2018-02-10

## 2018-02-09 MED ORDER — SODIUM CHLORIDE 0.9 % IV BOLUS
500.00 mL | Freq: Once | INTRAVENOUS | Status: AC
Start: 2018-02-09 — End: 2018-02-09
  Administered 2018-02-09: 13:00:00 500 mL via INTRAVENOUS

## 2018-02-09 MED ORDER — DORZOLAMIDE HCL 2 % OP SOLN
1.00 [drp] | Freq: Three times a day (TID) | OPHTHALMIC | Status: DC
Start: 2018-02-09 — End: 2018-02-10
  Administered 2018-02-09 – 2018-02-10 (×2): 1 [drp] via OPHTHALMIC
  Filled 2018-02-09: qty 200

## 2018-02-09 NOTE — Plan of Care (Signed)
Problem: Moderate/High Fall Risk Score >5  Goal: Patient will remain free of falls  Outcome: Progressing   02/09/18 2000   Moderate Risk Falls Interventions (6-13)   [facility] Moderate Risk (6-13) ALL REQUIRED LOW INTERVENTIONS;YELLOW NON-SKID SLIPPERS;Use assistive devices       Problem: Chest Pain  Goal: Vital signs and cardiac rhythm stable  Outcome: Progressing   02/09/18 2052   Goal/Interventions addressed this shift   Vital signs and cardiac rhythm stable Monitor Kelli Brown vital signs/cardiac rhythms;Monitor labs;Assess the need for oxygen therapy and administer as ordered     Goal: Cardiac pain management  Outcome: Progressing   02/09/18 2052   Goal/Interventions addressed this shift   Cardiac pain management  Assess/report chest pain/or related discomfort to LIP immediately;Instruct patient to report any change in pain status;Assess pain/or related discomfort on admission, during daily assessment, before and after any intervention;Include patient and patient care companion in decisions related to pain management

## 2018-02-09 NOTE — ED Notes (Signed)
Ambulated patient to restroom w/o any complaints.

## 2018-02-09 NOTE — ED Notes (Signed)
Bed: N6-A  Expected date:   Expected time:   Means of arrival:   Comments:  EMS

## 2018-02-09 NOTE — H&P (Signed)
[facility]  OBSERVATION UNIT  HISTORY AND PHYSICAL       Patient: Kelli Brown  Admission Date: 02/09/2018    DOB: 08/22/1919  Age: 84 y.o.    MRN: [id number]  Sex: female    PCP: Karilyn Cota, MD  Attending: Tori Milks, MD         HISTORY OF PRESENT ILLNESS     Kelli Brown is a 84 y.o. female who presented with One week of left-sided chest pain and pressure. Got worse this morning. She was seen and evaluated emergency department and had a nondiagnostic EKG. Initial troponin is 0. Creatinine slightly elevated at 1.4. She is currently chest pain-free. She'll be sent to the observation unit for serial cardiac enzymes and telemetry monitoring. Rest of the review systems are negative.    PAST MEDICAL HISTORY     Code Status: Prior    Past Medical History:   Diagnosis Date   . Abnormal vision    . Arthritis    . Congestive heart failure    . Hip fx, right, closed, initial encounter 2010   . Hyperlipidemia    . Hypertension    . Low back pain    . Macular degeneration    . Nonrheumatic aortic (valve) stenosis 08/11/2017   . Shingles        Past Surgical History:   Procedure Laterality Date   . APPENDECTOMY     . EYE SURGERY      cateracts   . HIP SURGERY Bilateral     Tubes tied   . TONSILLECTOMY     . TUBAL LIGATION         Current/Home Medications    AMIODARONE (PACERONE) 100 MG TABLET    Take 100 mg by mouth daily.    APIXABAN (ELIQUIS) 5 MG    Take 5 mg by mouth every 12 (twelve) hours.    ASPIRIN EC 81 MG EC TABLET    Take 81 mg by mouth every morning.        CALCIUM CARBONATE-VITAMIN D (CALTRATE 600+D PO)    Take 1 tablet by mouth every morning.    DORZOLAMIDE (TRUSOPT) 2 % OPHTHALMIC SOLUTION    Place 1 drop into the left eye 3 (three) times daily.       FOLIC ACID (FOLVITE) 1 MG TABLET    Take 1 mg by mouth daily.    FUROSEMIDE (LASIX) 40 MG TABLET    Take 40 mg by mouth Once each morning except Saturday and Sunday.    METOPROLOL XL (TOPROL-XL) 50 MG 24 HR TABLET    Take 50  mg by mouth every morning.        MIRABEGRON ER 25 MG TABLET SR 24 HR    Take 25 mg by mouth every morning.    MULTIPLE VITAMINS-MINERALS (CENTRUM WOMEN PO)    Take 1 tablet by mouth every morning.    POTASSIUM CHLORIDE 20 MEQ TAB CR    Take 20 mEq by mouth every morning.    PRAVASTATIN (PRAVACHOL) 40 MG TABLET    Take 40 mg by mouth every morning.           Allergies:   Allergies   Allergen Reactions   . Codeine Nausea And Vomiting and Other (See Comments)     LIGHTHEADEDNESS.   . Darvon [Propoxyphene] Nausea And Vomiting and Other (See Comments)     LIGHTHEADEDNESS.   Marland Kitchen Phenobarbital Swelling and Rash  Family History   Problem Relation Age of Onset   . Stroke Mother    . Hypertension Mother    . Coronary artery disease Mother    . Cancer Father    . Stroke Sister    . CABG Brother        SHx:  reports that she has never smoked. She has never used smokeless tobacco. She reports that she does not drink alcohol or use drugs.    REVIEW OF SYSTEMS     See above  PHYSICAL EXAM     Vital Signs: Blood pressure 121/64, pulse 80, temperature 98.1 F (36.7 C), temperature source Oral, resp. rate 19, height 1.524 m, weight 68 kg, SpO2 95 %.    Physical Examination: General appearance - alert, well appearing, and in no distress  Mental status - alert, oriented to person, place, and time  Eyes - pupils equal and reactive, extraocular eye movements intact  Chest - clear to auscultation, no wheezes, rales or rhonchi, symmetric air entry  Heart - normal rate, regular rhythm, normal S1, S2, no murmurs, rubs, clicks or gallops  Abdomen - soft, nontender, nondistended, no masses or organomegaly  Musculoskeletal - no joint tenderness, deformity or swelling  Extremities - peripheral pulses normal, no pedal edema, no clubbing or cyanosis  Skin - normal coloration and turgor, no rashes, no suspicious skin lesions noted    LABS & IMAGING     Labs:  Results for orders placed or performed during the hospital encounter of 02/09/18    CBC   Result Value Ref Range    WBC 4.3 4.0 - 11.0 K/cmm    RBC 3.35 (L) 3.80 - 5.00 M/cmm    Hemoglobin 11.0 (L) 12.0 - 16.0 gm/dL    Hematocrit 86.5 (L) 36.0 - 48.0 %    MCV 101 (H) 80 - 100 fL    MCH 33 28 - 35 pg    MCHC 33 32 - 36 gm/dL    RDW 78.4 (H) 69.6 - 14.0 %    PLT CT 197 130 - 440 K/cmm    MPV 7.3 6.0 - 10.0 fL    NEUTROPHIL % 54.6 42.0 - 78.0 %    Lymphocytes 28.8 15.0 - 46.0 %    Monocytes 13.7 3.0 - 15.0 %    Eosinophils % 1.7 0.0 - 7.0 %    Basophils % 1.2 0.0 - 3.0 %    Neutrophils Absolute 2.4 1.7 - 8.6 K/cmm    Lymphocytes Absolute 1.2 0.6 - 5.1 K/cmm    Monocytes Absolute 0.6 0.1 - 1.7 K/cmm    Eosinophils Absolute 0.1 0.0 - 0.8 K/cmm    BASO Absolute 0.1 0.0 - 0.3 K/cmm    RBC Morphology RBC Morphology Reviewed     Anisocytosis 1+     Elliptocytes 1+     Poikilocytosis 1+    Basic Metabolic Panel   Result Value Ref Range    Sodium 141 136 - 147 mMol/L    Potassium 3.8 3.5 - 5.3 mMol/L    Chloride 102 98 - 110 mMol/L    CO2 28.6 20.0 - 30.0 mMol/L    Calcium 9.8 8.5 - 10.5 mg/dL    Glucose 295 (H) 71 - 99 mg/dL    Creatinine 2.84 (H) 0.60 - 1.20 mg/dL    BUN 23 (H) 7 - 22 mg/dL    Anion Gap 13.2 7.0 - 18.0 mMol/L    BUN/Creatinine Ratio 14.7 10.0 - 30.0 Ratio  EGFR 28 (L) 60 - 150 mL/min/1.25m2    Osmolality Calc 287 275 - 300 mOsm/kg   Troponin I (Stat)   Result Value Ref Range    Troponin I <0.01 0.00 - 0.02 ng/mL       Imaging:  XR Chest AP Portable   Final Result   1.  The lungs are hyperinflated with increased reticular markings, suggestive of COPD.   2.  Minimal pleural fluid or thickening at the right costophrenic angle.      ReadingStation:[id number]          EKG: Sinus rhythm; negative for ischemic changes.    EMERGENCY DEPARTMENT COURSE     ED Medication Orders     Start Ordered     Status Ordering Provider    02/09/18 1246 02/09/18 1245  aspirin chewable tablet 162 mg  Once in ED     Route: Oral  Ordered Dose: 162 mg     Last MAR action:  Given WATTS, MICHAEL E    02/09/18 1242  02/09/18 1241  sodium chloride 0.9 % bolus 500 mL  Once in ED     Route: Intravenous  Ordered Dose: 500 mL     Last MAR action:  New Bag WATTS, MICHAEL E          ED documentation including nurses notes were reviewed by myself.  The case was discussed with the ED Attending.    ASSESSMENT & PLAN     Nila Winker Sheffer is a 84 y.o. female admitted under OBSERVATION with 1 week of left-sided chest pain and pressure.    Assessment & Plan:  1.  Transfer to the observation unit  2.  Telemetry monitoring  3.  Serial troponins  4.  No need for stress testing.    I certify the need for admission to the Observation Unit based on the patient's history and the information above.    Kelli Skeens, MD   02/09/2018 1:36 PM

## 2018-02-09 NOTE — ED Provider Notes (Signed)
EMERGENCY DEPARTMENT  History and Physical Exam       Patient Name: Kelli Brown, Kelli Brown  Encounter Date:  02/09/2018  Attending Physician: Krystal Clark, MD  PCP: Karilyn Cota, MD  Patient DOB:  [birth year]  MRN:  [id number]  Room:  N6/N6-A    Medical Decision Making     Patient complain one week of symptoms became worse last night. She describes a discomfort in her chest, goes up into her jaw. Nothing makes it better or worse. Previously diagnosed with atrial fibrillation. Currently in a sinus rhythm on the monitor. She does have a family history. She has no other cardiac history. No history of MI. No pleuritic chest pain. No leg pain or leg swelling. No fevers or cough.    Physical exam is fairly benign at this time. Her heart rate is she does have a heart murmur. Her lungs are clear to auscultation. No leg edema. No calf tenderness. Her abdomen is soft and nontender without any pulsating mass.    84 year old with symptoms concerning for ACS. I listed for PE, AAA or pneumonia. No evidence of musculoskeletal etiology.    ED Course  EKG shows a sinus rhythm. Flattening of the T waves and ST segments laterally. No definitive ischemic changes.  Troponin 0.01. CBC and chemistry are unremarkable.  Chest x-ray without pneumonia.  Patient remained stable. I spoke with the observation unit physician for further cardiac risk stratification.  The distal consider GIs a possibility.      In addition to the above history, please see nursing notes. Allergies, meds, past medical, family, social hx, and the results of the diagnostic studies performed have been reviewed by myself.         Diagnosis / Disposition     Clinical Impression  1. Chest pain at rest        Disposition  ED Disposition     ED Disposition Condition Date/Time Comment    Observation  Wed Feb 09, 2018  1:40 PM Admitting Physician: Dedra Skeens [[id number]]   Diagnosis: Chest pain [3244010]   Estimated Length of Stay: < 2 midnights   Tentative Discharge  Plan?: Home or Self Care [1]   Patient Class: Observation [104]              Follow up for Discharged Patients  No follow-up provider specified.    Prescriptions for Discharged Patients  New Prescriptions    No medications on file                   History of Presenting Illness     Chief complaint: Chest Pain    HPI/ROS is limited by: none  HPI/ROS given by: patient    Kelli Brown is a 85 y.o. female who presents to the ED with complaint of mild left-sided chest pain that started x 1 week ago. Patient reports her pain is intermittent she describes her pain as "dull" and is tolerable but she has some discomfort. Patient reports she noticed her pain this morning after she started walking around. Her pain comes and goes, it is not associated with movement, or breathing. Patient was here in December of 2018 for pneumonia and was diagnosed with atrial fibrillation. Patient reports her mother had an MI, and her brother had a pacemaker put in, in the past. Nothing seems to help with her discomfort, and is worsening. No further constitutional complaints.        Review of Systems   Review of Systems  Constitutional: Negative for chills and fever.   HENT: Negative for congestion and sore throat.    Respiratory: Negative for cough and shortness of breath.    Cardiovascular: Positive for chest pain. Negative for palpitations and leg swelling.   Gastrointestinal: Negative for abdominal pain, blood in stool, diarrhea, nausea and vomiting.   Genitourinary: Negative for dysuria and hematuria.   Musculoskeletal: Negative for back pain.   Skin: Negative for rash.   Neurological: Negative for weakness and headaches.   All other systems reviewed and are negative.       Other pertinent review of systems findings in history of present illness.  Allergies & Medications     Pt is allergic to codeine; darvon [propoxyphene]; and phenobarbital.    Current/Home Medications    AMIODARONE (PACERONE) 100 MG TABLET    Take 100 mg by mouth  daily.    APIXABAN (ELIQUIS) 5 MG    Take 5 mg by mouth every 12 (twelve) hours.    ASPIRIN EC 81 MG EC TABLET    Take 81 mg by mouth every morning.        CALCIUM CARBONATE-VITAMIN D (CALTRATE 600+D PO)    Take 1 tablet by mouth every morning.    DORZOLAMIDE (TRUSOPT) 2 % OPHTHALMIC SOLUTION    Place 1 drop into the left eye 3 (three) times daily.       FOLIC ACID (FOLVITE) 1 MG TABLET    Take 1 mg by mouth daily.    FUROSEMIDE (LASIX) 40 MG TABLET    Take 40 mg by mouth Once each morning except Saturday and Sunday.    METOPROLOL XL (TOPROL-XL) 50 MG 24 HR TABLET    Take 50 mg by mouth every morning.        MIRABEGRON ER 25 MG TABLET SR 24 HR    Take 25 mg by mouth every morning.    MULTIPLE VITAMINS-MINERALS (CENTRUM WOMEN PO)    Take 1 tablet by mouth every morning.    POTASSIUM CHLORIDE 20 MEQ TAB CR    Take 20 mEq by mouth every morning.    PRAVASTATIN (PRAVACHOL) 40 MG TABLET    Take 40 mg by mouth every morning.            Past Medical History     Pt   Past Medical History:   Diagnosis Date   . Abnormal vision    . Arthritis    . Congestive heart failure    . Hip fx, right, closed, initial encounter 2010   . Hyperlipidemia    . Hypertension    . Low back pain    . Macular degeneration    . Nonrheumatic aortic (valve) stenosis 08/11/2017   . Shingles         Past Surgical History     Pt   Past Surgical History:   Procedure Laterality Date   . APPENDECTOMY     . EYE SURGERY      cateracts   . HIP SURGERY Bilateral     Tubes tied   . TONSILLECTOMY     . TUBAL LIGATION          Family History     The family history includes CABG in her brother; Cancer in her father; Coronary artery disease in her mother; Hypertension in her mother; Stroke in her mother and sister.     Social History     Pt reports that she has never smoked. She has never used smokeless  tobacco. She reports that she does not drink alcohol or use drugs.     Physical Exam     Blood pressure 121/64, pulse 80, temperature 98.1 F (36.7 C), temperature  source Oral, resp. rate 19, height 1.524 m, weight 68 kg, SpO2 95 %.    Vitals signs reviewed.    Physical Exam   Constitutional: She is oriented to person, place, and time. She appears well-developed and well-nourished. No distress.   HENT:   Head: Normocephalic and atraumatic.   Mouth/Throat: Oropharynx is clear and moist.   Eyes: Pupils are equal, round, and reactive to light. Conjunctivae and EOM are normal.   Neck: Normal range of motion. Neck supple.   Cardiovascular: Normal rate, regular rhythm and intact distal pulses.    Murmur heard.  3/6 murmur right sternal border.   Pulmonary/Chest: Effort normal and breath sounds normal. She has no wheezes. She has no rales.   Abdominal: Soft. Bowel sounds are normal. She exhibits no distension. There is no tenderness. There is no rebound, no guarding and no CVA tenderness.   Musculoskeletal: Normal range of motion. She exhibits edema. She exhibits no tenderness.   Patient has some edema lower legs.   Neurological: She is alert and oriented to person, place, and time. No cranial nerve deficit. She exhibits normal muscle tone.   Skin: Skin is warm and dry.   Psychiatric: She has a normal mood and affect. Her behavior is normal.   Nursing note and vitals reviewed.         Diagnostic Results     The results of the diagnostic studies have been reviewed by myself:    Radiologic Studies  Xr Chest Ap Portable    Result Date: 02/09/2018  1.  The lungs are hyperinflated with increased reticular markings, suggestive of COPD. 2.  Minimal pleural fluid or thickening at the right costophrenic angle. ReadingStation:[id number]           Consultation                Procedures     None          Krystal Clark, MD      ATTESTATIONS      The documentation recorded by my scribe, Dois Davenport, accurately reflects the services I personally performed and the decisions made by me.  Krystal Clark, MD    Note: This chart was generated by the Epic EMR system/speech recognition and may contain  inherent errors or omissions not intended by the user. Grammatical errors, random word insertions, deletions, pronoun errors and incomplete sentences are occasional consequences of this technology due to software limitations. Not all errors are caught or corrected. If there are questions or concerns about the content of this note or information contained within the body of this dictation they should be addressed directly with the author for clarification        Tori Milks, MD  02/10/18 1410

## 2018-02-09 NOTE — Plan of Care (Signed)
Problem: Moderate/High Fall Risk Score >5  Goal: Patient will remain free of falls  Outcome: Progressing

## 2018-02-10 LAB — BASIC METABOLIC PANEL
Anion Gap: 10.7 mMol/L (ref 7.0–18.0)
BUN / Creatinine Ratio: 19.4 Ratio (ref 10.0–30.0)
BUN: 25 mg/dL — ABNORMAL HIGH (ref 7–22)
CO2: 27 mMol/L (ref 20.0–30.0)
Calcium: 8.7 mg/dL (ref 8.5–10.5)
Chloride: 107 mMol/L (ref 98–110)
Creatinine: 1.29 mg/dL — ABNORMAL HIGH (ref 0.60–1.20)
EGFR: 35 mL/min/{1.73_m2} — ABNORMAL LOW (ref 60–150)
Glucose: 81 mg/dL (ref 71–99)
Osmolality Calc: 283 mOsm/kg (ref 275–300)
Potassium: 4.7 mMol/L (ref 3.5–5.3)
Sodium: 140 mMol/L (ref 136–147)

## 2018-02-10 MED ORDER — APIXABAN 5 MG PO TABS
2.50 mg | ORAL_TABLET | Freq: Two times a day (BID) | ORAL | 1 refills | Status: DC
Start: 2018-02-10 — End: 2018-03-16

## 2018-02-10 NOTE — Progress Notes (Signed)
NURSE NOTE SUMMARY     Patient Name: Kelli Brown   Attending Physician: Dedra Skeens   Primary Care Physician: Karilyn Cota, MD   Date of Admission:   02/09/2018   Today's date:   02/10/2018 LOS: 0 days   Shift Summary:                                                              0715 pt already been up sponged bath, self oral care. Sitting up in chair in room, states had a good night, sleep well. States wants to go home if possible. Meds given. Call bell within reach.  1610 eating bkft.   Provider Notifications:      Rapid Response Notifications:  Mobility:        PMP Activity: Step 7 - Walks out of Room (02/10/2018  7:15 AM)     Weight tracking:  Family Dynamic:     Last 3 Weights for the past 72 hrs (Last 3 readings):   Weight   02/09/18 0952 68 kg (150 lb)       Recent Vitals:  Active Problems:     BP 122/48   Pulse 72   Temp 97.7 F (36.5 C) (Oral)   Resp 20   Ht 1.524 m (5')   Wt 68 kg (150 lb)   SpO2 93%   BMI 29.29 kg/m          Active Problems:    Chest pain

## 2018-02-10 NOTE — UM Notes (Signed)
[facility] Utilization Management Review Sheet    Facility :  [facility] - [facility]    NAME: Sephira Zellman Weakland  MR#: [id number]    CSN#: [id number]    ROOM: 2502/2502-A AGE: 84 y.o.    ADMIT DATE AND TIME: 02/09/2018  9:37 AM    PATIENT CLASS: OBSERVATION  02/09/18 @ 1340 ADMIT OBSERVATION-OBS UNIT    ATTENDING PHYSICIAN: Dedra Skeens,*  PAYOR:Payor: MEDICARE / Plan: MEDICARE PART A AND B / Product Type: Medicare /       AUTH #: NPR    DIAGNOSIS:     ICD-10-CM    1. Chest pain at rest R07.9        HISTORY:   Past Medical History:   Diagnosis Date   . Abnormal vision    . Arthritis    . Congestive heart failure    . Hip fx, right, closed, initial encounter 2010   . Hyperlipidemia    . Hypertension    . Low back pain    . Macular degeneration    . Nonrheumatic aortic (valve) stenosis 08/11/2017   . Shingles      DATE OF REVIEW: 02/10/2018    VITALS: BP 122/48   Pulse 72   Temp 97.7 F (36.5 C) (Oral)   Resp 20   Ht 1.524 m (5')   Wt 68 kg (150 lb)   SpO2 93%   BMI 29.29 kg/m     Active Hospital Problems    Diagnosis   . Chest pain     DATE OF ED TREATMENT- 02/09/18  TREATMENT IN ED:  IVF, ASA     OBSERVATION REVIEW    HPI: Presents with 1 week of left sided chest pain and pressure.    ABNORMAL LABS:   H&H 11.0/33.7 GLUC 138 CR 1.56   EKG: Sinus rhythm; negative for ischemic changes.    DIAGNOSTIC TESTING: XR CHEST AP PORTABLE: The lungs are hyperinflated with increased reticular markings, suggestive of COPD.  Minimal pleural fluid or thickening at the right costophrenic angle.    VS: BP 122/48   Pulse 72   Temp 97.7 F (36.5 C) (Oral)   Resp 20   Ht 1.524 m (5')   Wt 68 kg (150 lb)   SpO2 93%   BMI 29.29 kg/m     ABNORMAL FINDINGS:    ASSESSMENT AND PLAN:  LEFT CHEST PAIN AND PRESSURE  PLAN: TELEMETRY, SERIAL TROPS,     OBSERVATION ORDERS   VS Q 8 HRS, TELEMETRY, SERIAL TROPS, CONTINUE HOME MEDS, IVF @ 100    DAY 2 OBSERVATION FOLLOW-UP  02/10/18  LABS: TROPS NEG  VS: BP 122/48   Pulse 72    Temp 97.7 F (36.5 C) (Oral)   Resp 20   Ht 1.524 m (5')   Wt 68 kg (150 lb)   SpO2 93%   BMI 29.29 kg/m   CURRENT ORDERS AS ABOVE  [place]t ORDER PLACED    Wende Mott, RN  Utilization Management  Case Management Department    [facility]  [address]  [place], [place] [address]  T [phone]    F [phone]   [email or web address]

## 2018-02-10 NOTE — Discharge Summary (Signed)
[facility]  OBSERVATION UNIT  DISCHARGE SUMMARY      Patient: Kelli Brown  Admission Date: 02/09/2018  9:37 AM   DOB: [birth year]  Discharge Date: 02/10/18 9:10 AM   MRN: [id number]  Primary Care: Karilyn Cota, MD         Presentation     Kelli Brown is a 84 y.o. female who presented with One week of left-sided chest pain and pressure. Got worse this morning. She was seen and evaluated emergency department and had a nondiagnostic EKG. Initial troponin is 0. Creatinine slightly elevated at 1.4. She is currently chest pain-free. She'll be sent to the observation unit for serial cardiac enzymes and telemetry monitoring. Rest of the review systems are negative.    Patient was admitted to the Observation Unit with a diagnosis of   Patient Active Problem List   Diagnosis   . Cervical spine fracture   . Ocular hypertension of left eye   . Chronic midline low back pain without sciatica   . Nonrheumatic aortic (valve) stenosis   . Pneumonia   . Chest pain              Hospital Course     Vitals:    02/10/18 0728   BP: 122/48   Pulse: 72   Resp:    Temp:    SpO2:      Patient was transferred to the observation unit and kept on telemetry monitoring. She had 3 sets of cardiac enzymes that were all negative at less than 0.01. She had chest pain in the night that was immediately relieved with rest and Tylenol. She states she rested well and is feeling much better. She already sees Dr. Tressia Danas, cardiology and has an appointment in the coming weeks. She is encouraged to follow-up with Dr. Tressia Danas as scheduled. She is encouraged to follow-up with the emergency department for any chest pain unrelieved with rest or Tylenol, shortness of breath, nausea or other concerning cardiac symptoms. Encouraged to follow-up with her primary care provider in one week for hospital follow-up. Her Eliquis dose is adjusted down to 2.5 mg twice a day per pharmacy recommendation based on the high creatinine and age.  This plan of care was reviewed with the patient, she is agreeable, and verbalizes no questions at this time.         Ancillary Studies     Labs:  Results for orders placed or performed during the hospital encounter of 02/09/18   CBC   Result Value Ref Range    WBC 4.3 4.0 - 11.0 K/cmm    RBC 3.35 (L) 3.80 - 5.00 M/cmm    Hemoglobin 11.0 (L) 12.0 - 16.0 gm/dL    Hematocrit 54.0 (L) 36.0 - 48.0 %    MCV 101 (H) 80 - 100 fL    MCH 33 28 - 35 pg    MCHC 33 32 - 36 gm/dL    RDW 98.1 (H) 19.1 - 14.0 %    PLT CT 197 130 - 440 K/cmm    MPV 7.3 6.0 - 10.0 fL    NEUTROPHIL % 54.6 42.0 - 78.0 %    Lymphocytes 28.8 15.0 - 46.0 %    Monocytes 13.7 3.0 - 15.0 %    Eosinophils % 1.7 0.0 - 7.0 %    Basophils % 1.2 0.0 - 3.0 %    Neutrophils Absolute 2.4 1.7 - 8.6 K/cmm    Lymphocytes Absolute 1.2 0.6 - 5.1  K/cmm    Monocytes Absolute 0.6 0.1 - 1.7 K/cmm    Eosinophils Absolute 0.1 0.0 - 0.8 K/cmm    BASO Absolute 0.1 0.0 - 0.3 K/cmm    RBC Morphology RBC Morphology Reviewed     Anisocytosis 1+     Elliptocytes 1+     Poikilocytosis 1+    Basic Metabolic Panel   Result Value Ref Range    Sodium 141 136 - 147 mMol/L    Potassium 3.8 3.5 - 5.3 mMol/L    Chloride 102 98 - 110 mMol/L    CO2 28.6 20.0 - 30.0 mMol/L    Calcium 9.8 8.5 - 10.5 mg/dL    Glucose 161 (H) 71 - 99 mg/dL    Creatinine 0.96 (H) 0.60 - 1.20 mg/dL    BUN 23 (H) 7 - 22 mg/dL    Anion Gap 04.5 7.0 - 18.0 mMol/L    BUN/Creatinine Ratio 14.7 10.0 - 30.0 Ratio    EGFR 28 (L) 60 - 150 mL/min/1.10m2    Osmolality Calc 287 275 - 300 mOsm/kg   Troponin I (Stat)   Result Value Ref Range    Troponin I <0.01 0.00 - 0.02 ng/mL   Troponin I   Result Value Ref Range    Troponin I <0.01 0.00 - 0.02 ng/mL   Troponin I   Result Value Ref Range    Troponin I 0.01 0.00 - 0.02 ng/mL   Basic Metabolic Panel   Result Value Ref Range    Sodium 140 136 - 147 mMol/L    Potassium 4.7 3.5 - 5.3 mMol/L    Chloride 107 98 - 110 mMol/L    CO2 27.0 20.0 - 30.0 mMol/L    Calcium 8.7 8.5 - 10.5  mg/dL    Glucose 81 71 - 99 mg/dL    Creatinine 4.09 (H) 0.60 - 1.20 mg/dL    BUN 25 (H) 7 - 22 mg/dL    Anion Gap 81.1 7.0 - 18.0 mMol/L    BUN/Creatinine Ratio 19.4 10.0 - 30.0 Ratio    EGFR 35 (L) 60 - 150 mL/min/1.82m2    Osmolality Calc 283 275 - 300 mOsm/kg       Radiology:  XR Chest AP Portable   Final Result   1.  The lungs are hyperinflated with increased reticular markings, suggestive of COPD.   2.  Minimal pleural fluid or thickening at the right costophrenic angle.      ReadingStation:[id number]          Stress Test Results:          Disposition     Diagnosis:   Patient Active Problem List    Diagnosis Date Noted   . Chest pain 02/09/2018   . Pneumonia 11/22/2017   . Nonrheumatic aortic (valve) stenosis 08/11/2017   . Chronic midline low back pain without sciatica 09/23/2016   . Ocular hypertension of left eye 09/04/2015   . Cervical spine fracture 10/23/2014       Past Medical History:   Diagnosis Date   . Abnormal vision    . Arthritis    . Congestive heart failure    . Hip fx, right, closed, initial encounter 2010   . Hyperlipidemia    . Hypertension    . Low back pain    . Macular degeneration    . Nonrheumatic aortic (valve) stenosis 08/11/2017   . Shingles        Discharge to: Home    Condition: Stable  Diet: regular diet    Activity:  activity as tolerated    Additional Patient Instructions:   1. DOSE ADJUSTMENT: Eliquis 5 mg STOP taking. Eliquis 2.5 mg two times daily. You may cover her current pill in half. You've been given a new prescription for this.  2. Tylenol as needed for pain.  3. Follow up with your primary care provider in one week for hospital follow-up  4. Follow up with Dr. Tressia Danas in 4-6 weeks or as scheduled  5. Return immediately to the emergency room for worsening chest pain that lasts a long time or is not relieved by Tylenol, shortness of breath, nausea, or other concerning symptoms.    Medication(s):  Current Discharge Medication List      CONTINUE these medications which  have NOT CHANGED    Details   amiodarone (PACERONE) 100 MG tablet Take 100 mg by mouth daily.      aspirin EC 81 MG EC tablet Take 81 mg by mouth every morning.          Calcium Carbonate-Vitamin D (CALTRATE 600+D PO) Take 1 tablet by mouth every morning.      dorzolamide (TRUSOPT) 2 % ophthalmic solution Place 1 drop into the left eye 3 (three) times daily.         folic acid (FOLVITE) 1 MG tablet Take 1 mg by mouth daily.      furosemide (LASIX) 40 MG tablet Take 40 mg by mouth Once each morning except Saturday and Sunday.      metoprolol XL (TOPROL-XL) 50 MG 24 hr tablet Take 50 mg by mouth every morning.          Mirabegron ER 25 MG Tablet SR 24 hr Take 25 mg by mouth every morning.      Multiple Vitamins-Minerals (CENTRUM WOMEN PO) Take 1 tablet by mouth every morning.      potassium chloride 20 MEQ Tab CR Take 20 mEq by mouth every morning.      pravastatin (PRAVACHOL) 40 MG tablet Take 40 mg by mouth every morning.                 Current Discharge Medication List          Follow Up:  Follow-up Information     Karilyn Cota, MD. Schedule an appointment as soon as possible for a visit in 1 week(s).    Specialty:  Family Medicine  Contact information:  [address]  [place] [address]  [phone]             Carlyon Shadow, MD. Schedule an appointment as soon as possible for a visit in 4 week(s).    Specialty:  Cardiology  Contact information:  [address]  [place] [address]  [phone]

## 2018-02-10 NOTE — Progress Notes (Addendum)
NURSE NOTE SUMMARY     Patient Name: Kelli Brown   Attending Physician: Dedra Skeens   Primary Care Physician: Karilyn Cota, MD   Date of Admission:   02/09/2018   Today's date:   02/10/2018 LOS: 0 days   Shift Summary:                                                              1900 - Pt reported experiencing episode of chest pain shortly before 1900, pt reported pain was 7/10 when it occurred, however it went away. Pt said current pain level was 2/10 and manageable. Informed pt to use call bell if she felt chest pain again.   2015 - gave pt 2.5mg  eliquis tablet, pt denied pain at this time.  2033 - assisted pt back in bed after using bedside commode, pt produced of clear yellow urine.  2045 - pt call light went off, pt reported chest pain 6/10. Gave pt 0.4mg  SL nitroglycerin.  2150 - pt reported chest pain resolved  0140 - pt's fluids complete, pt's IV saline locked. Pt used bedside commode, produced of clear yellow urine. Pt denied any chest pain at this time.  0525 - Pt resting in bed comfortably, call bell within reach, no needs at this time.  65 - Disconnected pt from heart monitor, assisted to bathroom via walker. Gave pt washcloths, soap, new gown, new disposable brief. Informed pt to use bathroom call light when she finished washing up and was ready to return to bedroom.     Provider Notifications:      Rapid Response Notifications:  Mobility:        PMP Activity: Step 6 - Walks in Room (02/09/2018  8:00 PM)     Weight tracking:  Family Dynamic:     Last 3 Weights for the past 72 hrs (Last 3 readings):   Weight   02/09/18 0952 68 kg (150 lb)       Recent Vitals:  Active Problems:     BP 112/57   Pulse 76   Temp 97.9 F (36.6 C) (Oral)   Resp 15   Ht 1.524 m (5')   Wt 68 kg (150 lb)   SpO2 99%   BMI 29.29 kg/m          Active Problems:    Chest pain

## 2018-02-10 NOTE — Discharge Instr - AVS First Page (Signed)
1. DOSE ADJUSTMENT: Eliquis 5 mg STOP taking. Eliquis 2.5 mg two times daily. You may cover her current pill in half. You've been given a new prescription for this.  2. Tylenol as needed for pain.  3. Follow up with your primary care provider in one week for hospital follow-up  4. Follow up with Dr. Tressia Danas in 4-6 weeks or as scheduled  5. Return immediately to the emergency room for worsening chest pain that lasts a long time or is not relieved by Tylenol, shortness of breath, nausea, or other concerning symptoms.      Stable Angina    The chest discomfort you have appears to be coming from your heart-a condition called angina. Angina is a pain in the heart due to poor blood flow to the heart muscle. This can occur when plaque builds up on the artery wall or a blood clot forms in one or more of the small blood vessels that deliver oxygen to the heart muscle. Plaque is a fatty material made up of cholesterol, calcium deposits, and other materials.  Exercise, increased activity, emotional upset, or stress can trigger this pain. It can also occur in cold weather or in the morning when you are about to state your day. With proper treatment and lifestyle changes to reduce risk factors, most people with angina are able to maintain a full and active life.  Angina is not a heart attack. But if angina pain is severe or prolonged, it is a sign ofan impending heart attack, also called acute myocardial infarction, or AMI. Your angina is under control at this time. Therefore, it is safe for you to go home. Follow up as instructed by your doctor for any further tests and office visits.  Home care   Rest at home today and avoid any emotional or physically strenuous activity.   Take medicine (usually nitroglycerin) for chest pain exactly as prescribed. Keep your nitroglycerin with you at all times.   When taking nitroglycerin for angina, sit or lie down. The medicine may make you feel dizzy.  ? Place 1 tablet under your  tongue, or between your lip and gum, or between your cheek and gum. Let the tablet dissolve completely; don't chew or swallow the tablet.  ? If you use a spray, then spray once on orunder your tongue. Don't inhale. Right after you use the spray, close your mouth. Wait a few seconds before you swallow.  ? After taking 1 tablet or spraying once, continue sitting or lying for 5 minutes.  ? If the angina goes away completely, rest awhile and continue your normal routine.  ? If angina persists, you may take 1 or 2 more doses if that was recommended by your healthcare provider. Don't take more than 3 doses within a 15 minute period.  Note: Your healthcare provider may give you slightly different instructions than those above. If so, follow them carefully.  Prevention   Learn how to take your own blood pressure. Keep a record of your results. Ask your doctor which readings mean that you need medical attention. Reduce salt intake and follow lifestyle change instructions if you have high blood pressure (hypertension).   Maintain a healthy weight. Get help to lose any extra pounds. Talk to your doctor about a safe diet program. Sudden large weight losses can be dangerous.   If you have diabetes, talk to your doctor about healthy control of your blood sugar.   Begin an exercise program. Ask your doctor how to  get started. You can benefit from simple activities such as walking or gardening and can gradually increase your exercise levels. Enrolling in cardiac rehabilitation is a good way to start an exercise program.   Break the smoking habit. Enroll in a stop-smoking program to improve your chances of success.   Get adequate rest.   Stay away from stressful situations. Learn stress-management techniques.  Follow-up care  Followup with your doctor, or as advised.  If an X-ray wasdone , it will be reviewed by another specialist. You will be notified of any new findings that may affect your care.  Call 911  This is the  fastest and safest way to get to the nearest emergency department. The paramedics can also start treatment on the way to the hospital, saving valuable time for your heart.   Tenny Craw gets worse, it continues, or if it stops and returns, call 911 right away. Don't delay. You may be having a heart attack.  Don'twait until your symptoms are severe to call 911. Other reasons to call 911 besides chest pain include:   Trouble breathing   Feeling lightheaded, faint, or dizzy   Rapid heart beat   Slower than usual heart rate compared to your normal   Angina with weakness, dizziness, fainting, heavy sweating, nausea, or vomiting   Extreme drowsiness, or confusion   Weakness of an arm or leg or on one side of the face   Trouble with speech or vision  When to seek medical advice  Remember, the signs and symptoms of a heart attack are not always like they are on TV. Sometimes they are not so obvious. You may only feel weak or just "not right." If it is not clear or if you have any doubt, call for advice.   Seek help if there is a change in the type of pain, if it feels different, or if your symptoms are mild.   Don't drive yourself. Have someone else drive. If no one can drive you, call 161.   If your doctor has given you medicine to take when you have symptoms, take them, but don't delay getting help.   Don't delay. Fast diagnosis and treatment can prevent or limit the amount of heart damage during a heart attack or stroke.   Don't go to your doctor's office or a clinic because they may not be able to provide all the testing and treatment you need.  Date Last Reviewed: 03/23/2017   2000-2018 [employer], [place]. [address] [place], [place] [address]. All rights reserved. This information is not intended as a substitute for professional medical care. Always follow your healthcare professional's instructions.

## 2018-02-10 NOTE — Plan of Care (Signed)
Problem: Chest Pain  Goal: Cardiac pain management  Outcome: Progressing

## 2018-02-10 NOTE — Progress Notes (Signed)
Written and verbal instructions given, with prescription(s). Patient verbalizes understanding. Discharged wheelchair per patient request.

## 2018-02-12 LAB — ECG 12-LEAD
P Wave Axis: 52 deg
P-R Interval: 163 ms
Patient Age: 89 years
Q-T Interval(Corrected): 442 ms
Q-T Interval: 365 ms
QRS Axis: 50 deg
QRS Duration: 84 ms
T Axis: 43 years
Ventricular Rate: 88 //min

## 2018-02-16 ENCOUNTER — Encounter: Payer: Self-pay | Admitting: Family Medicine

## 2018-02-16 DIAGNOSIS — R079 Chest pain, unspecified: Secondary | ICD-10-CM

## 2018-02-28 NOTE — Progress Notes (Signed)
Exam-specific instructions: dobutamine stress echo    Medication and NPO instructions: reviewed medication list over the phone; NPO and no caffeine/decaf x 12 hr prior to test.    Clothing instructions: comfortable    Arrival instructions: [facility] entrance; orange lot.    Other reminders: Order in epic.  No smoking x 8 hr. Questions answered.    Pt called on 02/28/18 for instructions.  Pt states she uses a walker.      Lebron Conners, RN, RRT  Nuclear Medicine Stress Lab RN  [phone]

## 2018-03-02 ENCOUNTER — Ambulatory Visit
Admission: RE | Admit: 2018-03-02 | Discharge: 2018-03-02 | Disposition: A | Discharge status: Home / Self Care | Payer: Medicare Other | Source: Ambulatory Visit | Attending: Family Medicine | Admitting: Family Medicine

## 2018-03-02 DIAGNOSIS — I509 Heart failure, unspecified: Secondary | ICD-10-CM | POA: Insufficient documentation

## 2018-03-02 DIAGNOSIS — E785 Hyperlipidemia, unspecified: Secondary | ICD-10-CM | POA: Insufficient documentation

## 2018-03-02 DIAGNOSIS — I499 Cardiac arrhythmia, unspecified: Secondary | ICD-10-CM | POA: Insufficient documentation

## 2018-03-02 DIAGNOSIS — I110 Hypertensive heart disease with heart failure: Secondary | ICD-10-CM | POA: Insufficient documentation

## 2018-03-02 DIAGNOSIS — I4891 Unspecified atrial fibrillation: Secondary | ICD-10-CM | POA: Insufficient documentation

## 2018-03-02 DIAGNOSIS — I350 Nonrheumatic aortic (valve) stenosis: Secondary | ICD-10-CM | POA: Insufficient documentation

## 2018-03-02 DIAGNOSIS — R079 Chest pain, unspecified: Secondary | ICD-10-CM

## 2018-03-02 MED ORDER — VH DOBUTAMINE 250 MG/125 ML D5W INFUSION (STRESS TEST)(SIMPLE)
10.00 ug/kg/min | Freq: Once | Status: AC
Start: 2018-03-02 — End: 2018-03-02
  Administered 2018-03-02: 11:00:00 30 ug/kg/min via INTRAVENOUS

## 2018-03-02 MED ORDER — VH DOBUTAMINE 250 MG/125 ML D5W INFUSION (STRESS TEST)(SIMPLE)
Status: AC
Start: 2018-03-02 — End: ?
  Filled 2018-03-02: qty 125

## 2018-03-02 NOTE — Progress Notes (Signed)
Dobutamine stress echo completed. Total infused  11.6 mg.  THR achieved.  Discharge instructions given to patient.  Questions were answered.     Lebron Conners, RN, RRT  Nuclear Medicine Stress Lab RN  [phone]

## 2018-03-02 NOTE — Progress Notes (Signed)
Dobutamine stress echo - 11.6 mg Dobutamine infused over 9:14 min; max dose of 30 mcg/kg/min; no chest pain; st changes with infusion which improved in recovery; bp decreased with infusion echo images pending [place] [id number]

## 2018-03-07 ENCOUNTER — Other Ambulatory Visit: Payer: Medicare Other

## 2018-03-09 ENCOUNTER — Telehealth: Payer: Self-pay

## 2018-03-09 ENCOUNTER — Emergency Department
Admission: EM | Admit: 2018-03-09 | Discharge: 2018-03-10 | Disposition: A | Discharge status: Home / Self Care | Payer: Medicare Other | Attending: Emergency Medicine | Admitting: Emergency Medicine

## 2018-03-09 ENCOUNTER — Emergency Department: Payer: Medicare Other

## 2018-03-09 DIAGNOSIS — R51 Headache: Secondary | ICD-10-CM | POA: Insufficient documentation

## 2018-03-09 DIAGNOSIS — I10 Essential (primary) hypertension: Secondary | ICD-10-CM | POA: Insufficient documentation

## 2018-03-09 DIAGNOSIS — E785 Hyperlipidemia, unspecified: Secondary | ICD-10-CM

## 2018-03-09 DIAGNOSIS — R519 Headache, unspecified: Secondary | ICD-10-CM

## 2018-03-09 LAB — ECG 12-LEAD
P Wave Axis: 46 deg
P-R Interval: 183 ms
Patient Age: 89 years
Q-T Interval(Corrected): 450 ms
Q-T Interval: 387 ms
QRS Axis: 35 deg
QRS Duration: 90 ms
T Axis: 27 years
Ventricular Rate: 81 //min

## 2018-03-09 NOTE — ED Notes (Signed)
Bed: N8-A  Expected date:   Expected time:   Means of arrival:   Comments:  EMS ETA [id number]

## 2018-03-09 NOTE — Telephone Encounter (Signed)
PCP was contacted for recent labs.///PCP Referral current 08/03/17

## 2018-03-10 LAB — I-STAT CHEM 8 CARTRIDGE
Anion Gap I-Stat: 14 (ref 7.0–16.0)
BUN I-Stat: 29 mg/dL — ABNORMAL HIGH (ref 7–22)
Calcium Ionized I-Stat: 4.5 mg/dL (ref 4.35–5.10)
Chloride I-Stat: 103 mMol/L (ref 98–110)
Creatinine I-Stat: 1.4 mg/dL — ABNORMAL HIGH (ref 0.60–1.20)
EGFR: 32 mL/min/{1.73_m2} — ABNORMAL LOW (ref 60–150)
Glucose I-Stat: 94 mg/dL (ref 71–99)
Hematocrit I-Stat: 30 % — ABNORMAL LOW (ref 36.0–48.0)
Hemoglobin I-Stat: 10.2 gm/dL — ABNORMAL LOW (ref 12.0–16.0)
Potassium I-Stat: 4.4 mMol/L (ref 3.5–5.3)
Sodium I-Stat: 139 mMol/L (ref 136–147)
TCO2 I-Stat: 27 mMol/L (ref 24–29)

## 2018-03-10 LAB — SEDIMENTATION RATE: Sed Rate: 22 mm/hr — ABNORMAL HIGH (ref 0–20)

## 2018-03-10 LAB — VH I-STAT CHEM 8 NOTIFICATION

## 2018-03-10 NOTE — ED Provider Notes (Signed)
ED DOC NOTE     History provided by the patient    History is limited by none    HPI     84 y.o. female    Right sided sharp head pain that lasted about one second    Onset just pta  Worsened by   Relieved by spontaneous  Quality sharp  Radiation   Severity moderate    Associated symptoms tingling in hands and feet bilaterally for one minute  No recent head trauma states on eliquis    Review of Systems   Review of Systems   All other systems reviewed and are negative.          PAST MEDICAL/SURGICAL HISTORY:  Past Medical History:   Diagnosis Date   . Abnormal vision    . Arthritis    . Atrial fibrillation    . Congestive heart failure    . Glaucoma    . Hip fx, right, closed, initial encounter 2010   . Hyperlipidemia    . Hypertension    . Low back pain    . Macular degeneration    . Nonrheumatic aortic (valve) stenosis 08/11/2017   . Shingles       Past Surgical History:   Procedure Laterality Date   . APPENDECTOMY     . EYE SURGERY      cateracts   . HIP SURGERY Bilateral     Tubes tied   . TONSILLECTOMY     . TUBAL LIGATION       Additional Past Medical/Surgical History:       SOCIAL AND FAMILY HISTORY:  Social History   Substance Use Topics   . Smoking status: Never Smoker   . Smokeless tobacco: Never Used   . Alcohol use No     Family History   Problem Relation Age of Onset   . Stroke Mother    . Hypertension Mother    . Coronary artery disease Mother    . Heart disease Mother    . Cancer Father    . Stroke Sister    . CABG Brother         Additional Social and Family History:  Additional SH: [x]   I reviewed SH above       Additional FH: [x]  I reviewed FH above    Allergies   Allergen Reactions   . Codeine Nausea And Vomiting and Other (See Comments)     LIGHTHEADEDNESS.   . Darvon [Propoxyphene] Nausea And Vomiting and Other (See Comments)     LIGHTHEADEDNESS.   Marland Kitchen Phenobarbital Swelling and Rash     Additional Allergies:    Prior to Admission medications    Medication Sig Start Date End Date Taking? Authorizing  Provider   aspirin EC 81 MG EC tablet Take 81 mg by mouth every morning.       Yes [provider]   Calcium Carbonate-Vitamin D (CALTRATE 600+D PO) Take 1 tablet by mouth every morning.   Yes [provider]   cholecalciferol (VITAMIN D-1000 MAX ST) 1000 units tablet Take by mouth   Yes [provider]   dorzolamide (TRUSOPT) 2 % ophthalmic solution Place 1 drop into the left eye 3 (three) times daily.    09/24/14  Yes [provider]   folic acid (FOLVITE) 1 MG tablet Take 1 mg by mouth daily.   Yes [provider]   furosemide (LASIX) 40 MG tablet Take 40 mg by mouth Once each morning except  Saturday and Sunday.   Yes [provider]   meloxicam (MOBIC) 7.5 MG tablet Take 7.5 mg by mouth daily   Yes [provider]   metoprolol XL (TOPROL-XL) 50 MG 24 hr tablet Take 50 mg by mouth every morning.     10/08/14  Yes [provider]   Multiple Vitamins-Minerals (CENTRUM WOMEN PO) Take 1 tablet by mouth every morning.   Yes [provider]   potassium chloride (K-DUR,KLOR-CON) 20 MEQ tablet Take by mouth 09/23/16  Yes [provider]   pravastatin (PRAVACHOL) 40 MG tablet Take 40 mg by mouth every morning.     11 /16/15  Yes [provider]   amiodarone (PACERONE) 100 MG tablet Take 100 mg by mouth daily.    [provider]   apixaban (ELIQUIS) 5 MG Take 0.5 tablets (2.5 mg total) by mouth every 12 (twelve) hours. 02/10/18   Lynda Rainwater, FNP   Mirabegron ER 25 MG Tablet SR 24 hr Take 25 mg by mouth every morning.    [provider]       PHYSICAL EXAM   Vitals:    03/10/18 0100   BP: 138/60   Pulse: 88   Resp: 18   Temp:    SpO2: 96%     [x]  Nursing note reviewed [x]  Vitals reviewed   Pulse Oximetry on Room Air Normal  Physical Exam   Constitutional: She is oriented to person, place, and time. She appears well-developed and well-nourished.   HENT:   Head: Normocephalic and atraumatic.    Mouth/Throat: Oropharynx is clear and moist.   Eyes: Pupils are equal, round, and reactive to light.   Neck: Normal range of motion.   Cardiovascular: Normal rate, regular rhythm and intact distal pulses.    Pulmonary/Chest: Effort normal and breath sounds normal.   Abdominal: Soft. There is no tenderness.   Neurological: She is alert and oriented to person, place, and time. No cranial nerve deficit or sensory deficit. She exhibits normal muscle tone. Coordination normal.   Normal speech  I assisted in ambulation to restroom (uses a walker) no ataxia   Psychiatric: She has a normal mood and affect.       LABS/TESTS:  Results     Procedure Component Value Units Date/Time    ESR [[id number]]  (Abnormal) Collected:  03/10/18 0000    Specimen:  Blood Updated:  03/10/18 0051     Sed Rate 22 (H) mm/hr     i-Stat Chem 8 CartrIDge [[id number]]  (Abnormal) Collected:  03/10/18 0010    Specimen:  Blood Updated:  03/10/18 0013     i-STAT Sodium 139 mMol/L      i-STAT Potassium 4.4 mMol/L      i-STAT Chloride 103 mMol/L      TCO2, ISTAT 27 mMol/L      Ionized Ca, ISTAT 4.50 mg/dL      i-STAT Glucose 94 mg/dL      i-STAT Creatinine 1.40 (H) mg/dL      i-STAT BUN 29 (H) mg/dL      Anion Gap, ISTAT 95.2     EGFR 32 (L) mL/min/1.47m2      i-STAT Hematocrit 30.0 (L) %      i-STAT Hemoglobin 10.2 (L) gm/dL     I-Stat Chem 8 [[id number]] Collected:  03/09/18 2339    Specimen:  ISTAT Updated:  03/10/18 0008     I-STAT Notification Istat Notification  Ct Head Wo- Specify Condition/reader    Result Date: 03/10/2018  No acute intracranial abnormality. ReadingStation:[id number]            EKG:   I have interpreted the EKG at the time it was performed and my official reading is as follows:  Last EKG Result     Procedure Component Value Units Date/Time    ECG 12 lead (Specify reason for exam) [[id number]] Collected:  03/09/18 2343     Updated:  03/09/18 2349     Patient Age 50 years      Patient DOB Apr 13, 1919     Patient Height --      Patient Weight --     Interpretation Text --     Sinus rhythm  Compared to ECG 02/09/2018 09:42:54  No significant changes    Electronically Signed On 03-09-2018 23:49:08 EDT by Elwin Mocha       Physician Interpreter [facility]     Ventricular Rate 81 //min      QRS Duration 90 ms      P-R Interval 183 ms      Q-T Interval 387 ms      Q-T Interval(Corrected) 450 ms      P Wave Axis 46 deg      QRS Axis 35 deg      T Axis 27 years             ED COURSE:  BP 138/60   Pulse 88   Temp 97.5 F (36.4 C) (Oral)   Resp 18   Ht 1.575 m   Wt 68 kg   SpO2 96%   BMI 27.44 kg/m   ED Course as of Mar 10 740   Thu Mar 10, 2018   0057 Sed Rate: (!) 22 [SB]      ED Course User Index  [SB] Harriette Bouillon, MD         PROCEDURES:        IMPRESSION:     84 y.o. female with  1. Headache disorder        DIFFERENTIAL DIAGNOSIS:   SUBARACHNOID HEMORRHAGE, EPIDURAL HEMATOMA, SUBDURAL HEMATOMA, TEMPORAL ARTERITIS, INTRACRANIAL MASS, NORMAL PRESSURE HYDROCEPHALUS, MIGRAINE, TENSION HEADACHE, CLUSTER HEADACHE, HERPES ZOSTER,INTRAPARENCHYMAL HEMORRHAGE       MEDICAL DECISION MAKING:  I have evaluated all labs and imaging studies in the scope of a single Emergency Department visit and have determined that at this time an acute life threatening illness  does not exist. I have explained all results to the patient and all questions have been answered.  The patient and family have been given strict return instructions to return to the ER for any worsening or changing symptoms and that they should follow up with a primary care provider. The patient was discharge home in stable condition.      PLAN:   ED Disposition     ED Disposition Condition Date/Time Comment    Discharge  Thu Mar 10, 2018 12:57 AM Ferol Luz Goyer discharge to home/self care.    Condition at disposition: Stable        Discharge Medication List as of 03/10/2018 12:57 AM               Harriette Bouillon, MD  03/10/18 [id number]

## 2018-03-10 NOTE — Discharge Instructions (Signed)
Headache, Unspecified    A number of things can cause headaches. The cause of your headache isn't clear. But it doesn't seem to be a sign of any serious illness.  You could have a tension headache or a migraine headache.  Stress can cause a tension headache. This can happen if you tense the muscles of your shoulders, neck, and scalp without knowing it. If this stress lasts long enough, you may develop a tension headache.  It is not clear why migraines occur, but certain things called" triggers" can raise the risk of having a migraine attack. Migraine triggers may include emotional stress or depression, or by hormone changes during the menstrual cycle. Other triggers include birth control pills and other medicines, alcohol or caffeine, foods with tyramine (such as aged cheese, wine), eyestrain, weather changes, missed meals, and lack of sleep or oversleeping.  Other causes of headache include:   Viral illness with high fever   Head injury with concussion   Sinus, ear, or throat infection   Dental pain and jaw joint (TMJ) pain  More serious but less common causes of headache include stroke, brain hemorrhage, brain tumor, meningitis, and encephalitis.  Home care  Follow these tips when taking care of yourself at home:   Don't drive yourself home if you were given pain medicine for your headache. Instead, have someone else drive you home. Try to sleep when you get home. You should feel much better when you wake up.   Apply heat to the back of your neck to ease a neck muscle spasm. Take care of a migraine headache by putting an ice pack on your forehead or at the base of your skull.   If you have nausea or vomiting, eat a light diet until your headache eases.   If you have a migraine headache, use sunglasses when in the daylight or around bright indoor lighting until your symptoms get better. Bright glaring light can make this type of headache worse.  Follow-up care  Follow up with your healthcare provider, or  as advised. Talk with your provider if you have frequent headaches. He or she can help figure out a treatment plan. By knowing the earliest signs of headache, and starting treatment right away, you may be able to stop the pain yourself.  When to seek medical advice  Call your healthcare provider right awayif any of these occur:   Your head pain suddenly gets worse after sexual intercourse or strenuous activity   Your head pain doesn't get better within 24 hours   You aren't able to keep liquids down (repeated vomiting)   Fever of 100.4F (38C) or higher, or as directed by your healthcare provider   Stiff neck   Extreme drowsiness, confusion, or fainting   Dizziness or dizziness with spinning sensation (vertigo)   Weakness in an arm or leg or one side of your face   You have trouble talking or seeing  Date Last Reviewed: 06/24/2015   2000-2018 [employer] [address], [place] [address]. All rights reserved. This information is not intended as a substitute for professional medical care. Always follow your healthcare professional's instructions.

## 2018-03-10 NOTE — ED Notes (Signed)
Pt to CT with tech.

## 2018-03-10 NOTE — Telephone Encounter (Signed)
PCP labs received from [facility].

## 2018-03-14 NOTE — Progress Notes (Signed)
Cardiology Follow-Up      Patient Name: Kelli Brown   Date of Birth: Apr 12, 1919    Provider: Carlyon Shadow, MD     Patient Care Team:  Flossie Dibble, MD as PCP - General (Family Medicine)  Flossie Dibble, MD as Consulting Physician (Family Medicine)  Carlyon Shadow, MD as Consulting Physician (Cardiology)    Chief Complaint: Aortic Stenosis and Leg Swelling      History of Present Illness   Kelli Brown is a 84 y.o. female seen today due to aortic stenosis and pulmonary hypertension. She was hospitalized from 02/09/18 to 02/10/18 due to left-sided chest pain which resolved on its own.     She was then in the emergency room on 03/10/18 due to a sharp right-sided head pain lasting 1 second. Her stress echo on 03/02/18 showed an EF of 55-60%, no evidence of ischemia, moderate aortic stenosis with a vmax of 3.66 m/s, and a max PG of 54 mmHg. She denies any angina or need for nitroglycerin. She denies tachycardia, syncope, or recent falls. She does experience shortness of breath, palpitations, and leg swelling.     Lab work on 11/24/17 showed cholesterol at 190, triglycerides 107, HDL 61, and LDL 108. She walks with a walker.       Review of Systems     Review of Systems   Constitutional: Negative for chills, diaphoresis, fever, malaise/fatigue and weight loss.   HENT: Negative for congestion, ear discharge, ear pain, hearing loss, nosebleeds, sinus pain, sore throat and tinnitus.    Eyes: Negative for blurred vision, double vision, photophobia, pain, discharge and redness.   Respiratory: Positive for shortness of breath. Negative for cough, hemoptysis, sputum production, wheezing and stridor.    Cardiovascular: Positive for palpitations and leg swelling. Negative for chest pain, orthopnea, claudication and PND.   Gastrointestinal: Negative for abdominal pain, blood in stool, constipation, diarrhea, heartburn, melena, nausea and vomiting.   Genitourinary: Negative for dysuria, flank pain, frequency, hematuria and  urgency.   Musculoskeletal: Negative for back pain, falls, joint pain, myalgias and neck pain.   Skin: Negative for itching and rash.   Neurological: Negative for dizziness, tingling, tremors, sensory change, speech change, focal weakness, seizures, loss of consciousness, weakness and headaches.   Endo/Heme/Allergies: Negative for environmental allergies and polydipsia. Bruises/bleeds easily.   Psychiatric/Behavioral: Negative for depression, hallucinations, memory loss, substance abuse and suicidal ideas. The patient is not nervous/anxious and does not have insomnia.        Past Medical History     1. Aortic stenosis.       a. Echo 08/25/17- EF 60-65. Grade I DD. LA severely enlarged. Moderate AS. Aortic peak velocity 3.5 m/s, mean gradient 24 mmHg. Pulmonary arterial pressure moderate elevated 55 mmHg.       b. Stress Echo 03/02/18- EF 55-60. No evidence of ischemia. Moderate AS with vmax 3.66 m/s, max PG 54 mmHg.     2. Pulmonary hypertension.   3. Leg edema.    Past Surgical History     Past Surgical History:   Procedure Laterality Date   . APPENDECTOMY     . EYE SURGERY      cateracts   . HIP SURGERY Bilateral     Tubes tied   . TONSILLECTOMY     . TUBAL LIGATION         Family History     Family History   Problem Relation Age of Onset   . Stroke Mother    .  Hypertension Mother    . Coronary artery disease Mother    . Heart disease Mother    . Cancer Father    . Stroke Sister    . CABG Brother        Social History     Social History   Substance Use Topics   . Smoking status: Never Smoker   . Smokeless tobacco: Never Used   . Alcohol use No     She has been widowed since age 68 in 56. Her only son died due to cancer of the intestines around age 33. 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 She is currently living in assisted living---[facility].     Allergies     Allergies   Allergen Reactions   . Codeine Nausea And Vomiting and Other (See Comments)     LIGHTHEADEDNESS.   . Darvon  [Propoxyphene] Nausea And Vomiting and Other (See Comments)     LIGHTHEADEDNESS.   Marland Kitchen Phenobarbital Swelling and Rash       Medications     Current Outpatient Prescriptions   Medication Sig   . amiodarone (PACERONE) 100 MG tablet Take 1 tablet (100 mg total) by mouth daily   . apixaban (ELIQUIS) 2.5 MG Take 1 tablet (2.5 mg total) by mouth every 12 (twelve) hours   . aspirin EC 81 MG EC tablet Take 81 mg by mouth every morning.       . dorzolamide (TRUSOPT) 2 % ophthalmic solution Place 1 drop into the left eye 3 (three) times daily.      . folic acid (FOLVITE) 1 MG tablet Take 1 tablet (1 mg total) by mouth daily   . furosemide (LASIX) 40 MG tablet Take 40 mg by mouth Once each morning except Saturday and Sunday.   . metoprolol XL (TOPROL-XL) 50 MG 24 hr tablet Take 50 mg by mouth every morning.       . Mirabegron ER 25 MG Tablet SR 24 hr Take 25 mg by mouth every morning   . Multiple Vitamins-Minerals (CENTRUM WOMEN PO) Take 1 tablet by mouth every morning.   . potassium chloride (K-DUR,KLOR-CON) 20 MEQ tablet Take by mouth   . meloxicam (MOBIC) 7.5 MG tablet Take 7.5 mg by mouth daily       Physical Exam     Visit Vitals  BP 142/70 (BP Site: Left arm)   Pulse 87   Ht 1.524 m (5')   Wt 70.1 kg (154 lb 9.6 oz)   BMI 30.19 kg/m     Vitals:    03/16/18 0949   BP: 142/70   BP Site: Left arm   Pulse: 87   Weight: 70.1 kg (154 lb 9.6 oz)   Height: 1.524 m (5')     Wt Readings from Last 3 Encounters:   03/16/18 70.1 kg (154 lb 9.6 oz)   03/09/18 68 kg (150 lb)   02/28/18 68 kg (150 lb)        Constitutional -  Well appearing, older female  Amazing for 89  in no distress  Eyes - Pupils equal and reactive, extraocular eye movements intact, sclera anicteric  Oropharynx - Moist mucous membranes  Respiratory - Clear to auscultation bilaterally, normal respiratory effort    Cardiovascular system -    Regular rate and rhythm    Normal S1, S2    [place] AS murmur widely radiating   Jugular venous pulse is normal  Carotid  upstroke normal, no carotid bruits auscultated   1+ pulses in the posterior tibial / dorsalis pedis bilaterally    Neurological - Alert, oriented, no focal neurological deficits  Extremities -  No clubbing or cyanosis. 1+ edema on left.   Skin - Warm and dry, few scattered ecchymotic areas  Psych- Appropriate affect    Labs     Lab Results   Component Value Date/Time    WBC 4.3 02/09/2018 09:46 AM    RBC 3.35 (L) 02/09/2018 09:46 AM    HGB 11.0 (L) 02/09/2018 09:46 AM    HCT 33.7 (L) 02/09/2018 09:46 AM    PLT 197 02/09/2018 09:46 AM       Lab Results   Component Value Date/Time    NA 140 02/10/2018 03:50 AM    K 4.7 02/10/2018 03:50 AM    CL 107 02/10/2018 03:50 AM    CO2 27.0 02/10/2018 03:50 AM    GLU 81 02/10/2018 03:50 AM    BUN 25 (H) 02/10/2018 03:50 AM    CREAT 1.40 (H) 03/10/2018 12:10 AM    CREAT 1.29 (H) 02/10/2018 03:50 AM    PROT 6.3 11/23/2017 07:53 AM    ALKPHOS 56 11/23/2017 07:53 AM    AST 21 11/23/2017 07:53 AM    ALT 13 11/23/2017 07:53 AM       Lab Results   Component Value Date/Time    CHOL 190 11/24/2017 12:21 PM    TRIG 107 11/24/2017 12:21 PM    HDL 61 11/24/2017 12:21 PM    LDL 108 11/24/2017 12:21 PM     01/13/17- Chol 187, Trig 124, HDL 65, LDL 97, Na 146, K 4.5, BUN 16, Creat 1.06, AG ratio 1.9, Alb 4.1, LFTs normal, Ca 9.3, Gluc 96, GFR 45    08/03/17- WBC 6.9, HGB 10.6, HCT 34.2, PLT 196, Na 145, K 4.6, BUN 23, Creat 1.2, Ca 9.7, Gluc 65, GFR 38    02/16/18- Na 143, K 4.9, BUN 24, Creat 1.37, Ca 9.9, Gluc 68, GFR 38    Cardiogenics:     EKG:  Normal sinus rhythm left atrial abnormality otherwise normal    Impression and Recommendations:     1. Aortic stenosis--moderate peak aortic valve velocity 3.5 by last echo being gradient 24 peak gradient by stress echo 54 peak velocity 3.6    2. Pulmonary hypertension    3.  CKD  Last creat 1.4    PLAN: Continue current medications. I have given her samples of Eliquis 2.5 mg..  She is definitely over 80. Her creatinine is borderline at 1.4..  Her  weight however is 70 kilos.  We will need to recheck creatinine in the near future to make sure she is on the right dose of eliquis  Lab work on 11/24/17 showed cholesterol at 190, triglycerides 107, HDL 61, and LDL 108. I have scheduled her for echocardiography with her next appointment this coming December to assess her aortic stenosis.   Carlyon Shadow, MD  03/16/2018    This note was scribed by Kelly Splinter on behalf of Carlyon Shadow, MD.

## 2018-03-15 ENCOUNTER — Telehealth: Payer: Self-pay

## 2018-03-15 NOTE — Telephone Encounter (Signed)
Contacted PCP again to get most recent A1c, Lipid per [facility] faxing 03/15/18@ 2:09pm labs DOS 11/2017.

## 2018-03-15 NOTE — Telephone Encounter (Signed)
Rec. Recent labs DOS 12/20/17 from PCP/Scanned

## 2018-03-16 ENCOUNTER — Ambulatory Visit: Payer: Medicare Other | Admitting: Cardiovascular Disease

## 2018-03-16 ENCOUNTER — Encounter: Payer: Self-pay | Admitting: Cardiovascular Disease

## 2018-03-16 VITALS — BP 142/70 | HR 87 | Ht 60.0 in | Wt 154.6 lb

## 2018-03-16 DIAGNOSIS — I350 Nonrheumatic aortic (valve) stenosis: Secondary | ICD-10-CM

## 2018-03-16 DIAGNOSIS — I272 Pulmonary hypertension, unspecified: Secondary | ICD-10-CM

## 2018-03-16 MED ORDER — MIRABEGRON ER 25 MG PO TB24
25.00 mg | ORAL_TABLET | Freq: Every morning | ORAL | 3 refills | Status: DC
Start: 2018-03-16 — End: 2018-12-31

## 2018-03-16 MED ORDER — AMIODARONE HCL 100 MG PO TABS
100.00 mg | ORAL_TABLET | Freq: Every day | ORAL | 3 refills | Status: DC
Start: 2018-03-16 — End: 2018-12-31

## 2018-03-16 MED ORDER — APIXABAN 2.5 MG PO TABS
2.50 mg | ORAL_TABLET | Freq: Two times a day (BID) | ORAL | 3 refills | Status: DC
Start: 2018-03-16 — End: 2018-12-31

## 2018-03-16 MED ORDER — FOLIC ACID 1 MG PO TABS
1.00 mg | ORAL_TABLET | Freq: Every day | ORAL | 3 refills | Status: AC
Start: 2018-03-16 — End: ?

## 2018-05-03 NOTE — Progress Notes (Signed)
Received from [facility] a Denial for brand name myrbetriq er 25 mg tablet.  Forwarded to [facility] for Dr Tressia Danas.  Odessa Fleming

## 2018-07-18 ENCOUNTER — Encounter: Payer: Self-pay | Admitting: Family Medicine

## 2018-07-18 DIAGNOSIS — E049 Nontoxic goiter, unspecified: Secondary | ICD-10-CM

## 2018-07-27 ENCOUNTER — Ambulatory Visit
Admission: RE | Admit: 2018-07-27 | Discharge: 2018-07-27 | Disposition: A | Discharge status: Home / Self Care | Payer: Medicare Other | Source: Ambulatory Visit | Attending: Family Medicine | Admitting: Family Medicine

## 2018-07-27 DIAGNOSIS — E079 Disorder of thyroid, unspecified: Secondary | ICD-10-CM | POA: Insufficient documentation

## 2018-07-27 DIAGNOSIS — E049 Nontoxic goiter, unspecified: Secondary | ICD-10-CM

## 2018-07-27 DIAGNOSIS — E042 Nontoxic multinodular goiter: Secondary | ICD-10-CM | POA: Insufficient documentation

## 2018-10-19 ENCOUNTER — Telehealth: Payer: Self-pay

## 2018-10-19 NOTE — Telephone Encounter (Signed)
Most recent office note, labs, etc.  Requested from Dr. Kennedy Bucker office and updates were requested in care everywhere

## 2018-10-21 NOTE — Progress Notes (Deleted)
Cardiovascular Imaging [facility]    Complete Echo    Patient name: Kelli Brown MR# [id number]   Gender: female    Test Date: 10/26/18  DOB: 08-23-1919    Age: 84 y.o.    BP:         Height:  5'2         Weight:    154   lbs  Indication: AS    Ordering Physician:Mattson            Primary: Flossie Dibble, MD    Sonographer: Valentino Saxon      Risk Factors:  HTN   PHTN   HPL   Dyslipidemia   CP   CAD/CABG/STENT   NSTEMI   Prior MI     Tachy/Brady   CKD   Murmur   GERD   SOB/DOE/COPD/Asthma/OSA  Edema    Fatigue   Anemia   Hypothyroid   SSS   AFIB/flutter   SVT/VT   PVCS/PAC    Near/ Syncope  Palpitations  ABN EKG  RBBB/LBBB/CHB  Dialted AO Root    AAA w/Repair  Thoracic Ao W/repair  ASD/ASD Repair VSD/ VSD Repair  PFO    Tetralogy of Fallot  Ebstein's  Marfan's  Ross Procedure  Obese  Diabetes    Former/Smoker/Tobacco use    Effusion: Pleural/Pericardial   Heart Failure: Diastolic/systolic/Unspecified    Devices:  Pacemaker/Defibrillator  Cardiomyopathy: Ischemic  NICM  Dilated   Hypertrophic/HOCM  Mitral Valve:  MV Repair  MV replacement MV Clip/Ring  MR MS  MVP  Tricuspid Valve: TV Repair   TV Replacement    Tricuspid Regurgitation      Aortic Valve: AS        AI         BIC. AV    Aortic Valve Repair    s/p TAVR   AVR  Pulmonic Valve:    PI     PS

## 2018-10-24 NOTE — Telephone Encounter (Signed)
FAXED 2ND REQ TO PCP FOR NOTE/LABS

## 2018-10-25 NOTE — Telephone Encounter (Signed)
NOTES, LABS AND EKG RECEIVED AND SCANNED TO CHART.

## 2018-10-25 NOTE — Progress Notes (Deleted)
Cardiology Follow-Up      Patient Name: Kelli Brown   Date of Birth: 1919/06/22    Provider: Carlyon Shadow, MD     Patient Care Team:  Flossie Dibble, MD as PCP - General (Family Medicine)  Flossie Dibble, MD as Consulting Physician (Family Medicine)  Carlyon Shadow, MD as Consulting Physician (Cardiology)    Chief Complaint: No chief complaint on file.      History of Present Illness   Kelli Brown is a 84 y.o. female being seen today for a follow-up of aortic stenosis and pulmonary hypertension.         1. Aortic stenosis--moderate peak aortic valve velocity 3.5 by last echo being gradient 24 peak gradient by stress echo 54 peak velocity 3.6    2. Pulmonary hypertension    3.  CKD  Last creat 1.4    PLAN: Continue current medications.     Review of Systems     ROS    Past Medical History     Hx 10/26/18 MDS    1. Aortic stenosis.   a. Echo 08/25/17- EF 60-65. Grade I DD. LA severely enlarged. Moderate AS. Aortic peak velocity 3.5 m/s, mean gradient 24 mmHg. Pulmonary arterial pressure moderate elevated 55 mmHg.   b. Stress Echo 03/02/18- EF 55-60. No evidence of ischemia. Moderate AS with vmax 3.66 m/s, max PG 54 mmHg.     2. Pulmonary hypertension.   3. Leg edema.      Past Surgical History     Past Surgical History:   Procedure Laterality Date   . APPENDECTOMY     . EYE SURGERY      cateracts   . HIP SURGERY Bilateral     Tubes tied   . TONSILLECTOMY     . TUBAL LIGATION         Family History     Family History   Problem Relation Age of Onset   . Stroke Mother    . Hypertension Mother    . Coronary artery disease Mother    . Heart disease Mother    . Cancer Father    . Stroke Sister    . CABG Brother        Social History     Social History     Tobacco Use   . Smoking status: Never Smoker   . Smokeless tobacco: Never Used   Substance Use Topics   . Alcohol use: No   . Drug use: No   Soc Hx:  She has been widowed since age 7 in 1. Her only son died due to cancer of the intestines around age 30. 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 She is currently living in assisted living---[facility].    Allergies     Allergies   Allergen Reactions   . Codeine Nausea And Vomiting and Other (See Comments)     LIGHTHEADEDNESS.   . Darvon [Propoxyphene] Nausea And Vomiting and Other (See Comments)     LIGHTHEADEDNESS.   Marland Kitchen Phenobarbital Swelling and Rash       Medications     Current Outpatient Medications   Medication Sig   . amiodarone (PACERONE) 100 MG tablet Take 1 tablet (100 mg total) by mouth daily   . apixaban (ELIQUIS) 2.5 MG Take 1 tablet (2.5 mg total) by mouth every 12 (twelve) hours   . aspirin EC 81 MG EC tablet Take 81 mg by  mouth every morning.       . dorzolamide (TRUSOPT) 2 % ophthalmic solution Place 1 drop into the left eye 3 (three) times daily.      . folic acid (FOLVITE) 1 MG tablet Take 1 tablet (1 mg total) by mouth daily   . furosemide (LASIX) 40 MG tablet Take 40 mg by mouth Once each morning except Saturday and Sunday.   . meloxicam (MOBIC) 7.5 MG tablet Take 7.5 mg by mouth daily   . metoprolol XL (TOPROL-XL) 50 MG 24 hr tablet Take 50 mg by mouth every morning.       . Mirabegron ER 25 MG Tablet SR 24 hr Take 25 mg by mouth every morning   . Multiple Vitamins-Minerals (CENTRUM WOMEN PO) Take 1 tablet by mouth every morning.   . potassium chloride (K-DUR,KLOR-CON) 20 MEQ tablet Take by mouth       Physical Exam   There were no vitals taken for this visit.  There were no vitals filed for this visit.  Wt Readings from Last 3 Encounters:   03/16/18 70.1 kg (154 lb 9.6 oz)   03/09/18 68 kg (150 lb)   02/28/18 68 kg (150 lb)        Constitutional -  Well appearing, older female  Amazing for 89  in no distress  Eyes - Pupils equal and reactive, extraocular eye movements intact, sclera anicteric  Oropharynx - Moist mucous membranes  Respiratory - Clear to auscultation bilaterally, normal respiratory effort    Cardiovascular system -               Regular rate and rhythm                Normal S1, S2               [place] AS murmur widely radiating              Jugular venous pulse is normal              Carotid upstroke normal, no carotid bruits auscultated              1+ pulses in the posterior tibial / dorsalis pedis bilaterally    Neurological - Alert, oriented, no focal neurological deficits  Extremities -  No clubbing or cyanosis. 1+ edema on left.   Skin - Warm and dry, few scattered ecchymotic areas  Psych- Appropriate affect    Labs     Lab Results   Component Value Date/Time    WBC 4.3 02/09/2018 09:46 AM    RBC 3.35 (L) 02/09/2018 09:46 AM    HGB 11.0 (L) 02/09/2018 09:46 AM    HCT 33.7 (L) 02/09/2018 09:46 AM    PLT 197 02/09/2018 09:46 AM       Lab Results   Component Value Date/Time    TSH 0.60 11/23/2017 07:53 AM       Lab Results   Component Value Date/Time    NA 140 02/10/2018 03:50 AM    K 4.7 02/10/2018 03:50 AM    CL 107 02/10/2018 03:50 AM    CO2 27.0 02/10/2018 03:50 AM    GLU 81 02/10/2018 03:50 AM    BUN 25 (H) 02/10/2018 03:50 AM    CREAT 1.40 (H) 03/10/2018 12:10 AM    CREAT 1.29 (H) 02/10/2018 03:50 AM    PROT 6.3 11/23/2017 07:53 AM    ALKPHOS 56 11/23/2017 07:53 AM    AST 21 11/23/2017 07:53 AM  ALT 13 11/23/2017 07:53 AM       Lab Results   Component Value Date/Time    CHOL 190 11/24/2017 12:21 PM    TRIG 107 11/24/2017 12:21 PM    HDL 61 11/24/2017 12:21 PM    LDL 108 11/24/2017 12:21 PM           02/16/18- Na 143, K 4.9, BUN 24, Creat 1.37, Ca 9.9, Gluc 68, GFR 38  Reviewed by: Carlyon Shadow, MD    Cardiogenics:     EKG:   Personally reviewed by me on 10/25/2018    Impression and Recommendations:       This note was scribed by Charlestine Massed on behalf of Carlyon Shadow, MD    Electronically signed by: Carlyon Shadow, MD  10/25/2018

## 2018-10-26 ENCOUNTER — Other Ambulatory Visit: Payer: Medicare Other

## 2018-10-26 ENCOUNTER — Encounter: Payer: Medicare Other | Admitting: Cardiovascular Disease

## 2018-12-31 ENCOUNTER — Emergency Department: Payer: Medicare Other

## 2018-12-31 ENCOUNTER — Inpatient Hospital Stay
Admission: EM | Admit: 2018-12-31 | Discharge: 2019-01-18 | DRG: 193 | Disposition: A | Discharge status: Skilled Nursing Facility | Payer: Medicare Other | Source: Other Acute Inpatient Hospital | Attending: Internal Medicine | Admitting: Internal Medicine

## 2018-12-31 DIAGNOSIS — J9601 Acute respiratory failure with hypoxia: Secondary | ICD-10-CM | POA: Diagnosis present

## 2018-12-31 DIAGNOSIS — I130 Hypertensive heart and chronic kidney disease with heart failure and stage 1 through stage 4 chronic kidney disease, or unspecified chronic kidney disease: Secondary | ICD-10-CM | POA: Diagnosis present

## 2018-12-31 DIAGNOSIS — I501 Left ventricular failure: Secondary | ICD-10-CM | POA: Diagnosis present

## 2018-12-31 DIAGNOSIS — I509 Heart failure, unspecified: Secondary | ICD-10-CM | POA: Diagnosis present

## 2018-12-31 DIAGNOSIS — Z66 Do not resuscitate: Secondary | ICD-10-CM | POA: Diagnosis present

## 2018-12-31 DIAGNOSIS — Z8249 Family history of ischemic heart disease and other diseases of the circulatory system: Secondary | ICD-10-CM

## 2018-12-31 DIAGNOSIS — I480 Paroxysmal atrial fibrillation: Secondary | ICD-10-CM | POA: Diagnosis present

## 2018-12-31 DIAGNOSIS — D631 Anemia in chronic kidney disease: Secondary | ICD-10-CM | POA: Diagnosis present

## 2018-12-31 DIAGNOSIS — I350 Nonrheumatic aortic (valve) stenosis: Secondary | ICD-10-CM | POA: Diagnosis present

## 2018-12-31 DIAGNOSIS — J189 Pneumonia, unspecified organism: Principal | ICD-10-CM | POA: Diagnosis present

## 2018-12-31 DIAGNOSIS — B370 Candidal stomatitis: Secondary | ICD-10-CM | POA: Diagnosis not present

## 2018-12-31 DIAGNOSIS — N183 Chronic kidney disease, stage 3 unspecified: Secondary | ICD-10-CM

## 2018-12-31 DIAGNOSIS — R5383 Other fatigue: Secondary | ICD-10-CM

## 2018-12-31 DIAGNOSIS — R791 Abnormal coagulation profile: Secondary | ICD-10-CM | POA: Diagnosis present

## 2018-12-31 DIAGNOSIS — Z885 Allergy status to narcotic agent status: Secondary | ICD-10-CM

## 2018-12-31 DIAGNOSIS — R0902 Hypoxemia: Secondary | ICD-10-CM

## 2018-12-31 DIAGNOSIS — Z823 Family history of stroke: Secondary | ICD-10-CM

## 2018-12-31 DIAGNOSIS — I272 Pulmonary hypertension, unspecified: Secondary | ICD-10-CM | POA: Diagnosis present

## 2018-12-31 DIAGNOSIS — I10 Essential (primary) hypertension: Secondary | ICD-10-CM

## 2018-12-31 DIAGNOSIS — E785 Hyperlipidemia, unspecified: Secondary | ICD-10-CM

## 2018-12-31 DIAGNOSIS — R197 Diarrhea, unspecified: Secondary | ICD-10-CM | POA: Diagnosis present

## 2018-12-31 DIAGNOSIS — H409 Unspecified glaucoma: Secondary | ICD-10-CM | POA: Diagnosis present

## 2018-12-31 DIAGNOSIS — Z9851 Tubal ligation status: Secondary | ICD-10-CM

## 2018-12-31 DIAGNOSIS — M1990 Unspecified osteoarthritis, unspecified site: Secondary | ICD-10-CM | POA: Diagnosis present

## 2018-12-31 DIAGNOSIS — Z7901 Long term (current) use of anticoagulants: Secondary | ICD-10-CM

## 2018-12-31 DIAGNOSIS — I5033 Acute on chronic diastolic (congestive) heart failure: Secondary | ICD-10-CM | POA: Diagnosis present

## 2018-12-31 DIAGNOSIS — Z9049 Acquired absence of other specified parts of digestive tract: Secondary | ICD-10-CM

## 2018-12-31 DIAGNOSIS — I951 Orthostatic hypotension: Secondary | ICD-10-CM | POA: Diagnosis present

## 2018-12-31 DIAGNOSIS — I110 Hypertensive heart disease with heart failure: Secondary | ICD-10-CM

## 2018-12-31 LAB — VH URINALYSIS WITH MICROSCOPIC AND CULTURE IF INDICATED
Bilirubin, UA: NEGATIVE
Glucose, UA: NEGATIVE mg/dL
Ketones UA: 5 mg/dL
Leukocyte Esterase, UA: NEGATIVE Leu/uL
Nitrite, UA: NEGATIVE
Protein, UR: NEGATIVE mg/dL
RBC, UA: 14 /hpf — ABNORMAL HIGH (ref 0–5)
Urine Specific Gravity: 1.012 (ref 1.001–1.040)
Urobilinogen, UA: NORMAL mg/dL
WBC, UA: 1 /hpf (ref 0–4)
pH, Urine: 7 pH (ref 5.0–8.0)

## 2018-12-31 LAB — I-STAT CG4 ARTERIAL CARTRIDGE
BE, ISTAT: -1 mMol/L
HCO3, ISTAT: 23.3 mMol/L (ref 20.0–29.0)
Lactic Acid I-Stat: 0.76 mMol/L (ref 0.50–2.10)
O2 Sat, %, ISTAT: 89 % — ABNORMAL LOW (ref 96–100)
PCO2, ISTAT: 34.4 mm Hg — ABNORMAL LOW (ref 35.0–45.0)
PO2, ISTAT: 54 mm Hg — ABNORMAL LOW (ref 75–100)
Room Number I-Stat: 27
TCO2 I-Stat: 24 mMol/L (ref 24–29)
i-STAT FIO2: 32 %
i-STAT Liters Per Minute: 3 L/min
pH, ISTAT: 7.44 (ref 7.35–7.45)

## 2018-12-31 LAB — CBC AND DIFFERENTIAL
Basophils %: 0.3 % (ref 0.0–3.0)
Basophils Absolute: 0 10*3/uL (ref 0.0–0.3)
Eosinophils %: 1.7 % (ref 0.0–7.0)
Eosinophils Absolute: 0.1 10*3/uL (ref 0.0–0.8)
Hematocrit: 34.9 % — ABNORMAL LOW (ref 36.0–48.0)
Hemoglobin: 11.1 gm/dL — ABNORMAL LOW (ref 12.0–16.0)
Lymphocytes Absolute: 0.9 10*3/uL (ref 0.6–5.1)
Lymphocytes: 11.9 % — ABNORMAL LOW (ref 15.0–46.0)
MCH: 32 pg (ref 28–35)
MCHC: 32 gm/dL (ref 32–36)
MCV: 101 fL — ABNORMAL HIGH (ref 80–100)
MPV: 7 fL (ref 6.0–10.0)
Monocytes Absolute: 0.7 10*3/uL (ref 0.1–1.7)
Monocytes: 8.4 % (ref 3.0–15.0)
Neutrophils %: 77.7 % (ref 42.0–78.0)
Neutrophils Absolute: 6.1 10*3/uL (ref 1.7–8.6)
PLT CT: 259 10*3/uL (ref 130–440)
RBC: 3.46 10*6/uL — ABNORMAL LOW (ref 3.80–5.00)
RDW: 17.6 % — ABNORMAL HIGH (ref 11.0–14.0)
WBC: 7.8 10*3/uL (ref 4.0–11.0)

## 2018-12-31 LAB — BASIC METABOLIC PANEL
Anion Gap: 19.7 mMol/L — ABNORMAL HIGH (ref 7.0–18.0)
BUN / Creatinine Ratio: 15.3 Ratio (ref 10.0–30.0)
BUN: 20 mg/dL (ref 7–22)
CO2: 24 mMol/L (ref 20–30)
Calcium: 9 mg/dL (ref 8.5–10.5)
Chloride: 103 mMol/L (ref 98–110)
Creatinine: 1.31 mg/dL — ABNORMAL HIGH (ref 0.60–1.20)
EGFR: 34 mL/min/{1.73_m2} — ABNORMAL LOW (ref 60–150)
Glucose: 98 mg/dL (ref 71–99)
Osmolality Calculated: 286 mOsm/kg (ref 275–300)
Potassium: 4.7 mMol/L (ref 3.5–5.3)
Sodium: 142 mMol/L (ref 136–147)

## 2018-12-31 LAB — ECG 12-LEAD
P Wave Axis: 56 deg
P-R Interval: 160 ms
Patient Age: 89 years
Q-T Interval(Corrected): 439 ms
Q-T Interval: 360 ms
QRS Axis: 25 deg
QRS Duration: 84 ms
T Axis: 60 years
Ventricular Rate: 89 //min

## 2018-12-31 LAB — PT/INR
PT INR: 8.8 (ref 0.5–1.3)
PT: 85.1 s — ABNORMAL HIGH (ref 9.5–11.5)

## 2018-12-31 LAB — TROPONIN I: Troponin I: 0.01 ng/mL (ref 0.00–0.02)

## 2018-12-31 LAB — PROCALCITONIN: Procalcitonin: 0.15 ng/mL (ref 0.00–0.24)

## 2018-12-31 LAB — VH INFLUENZA A/B RAPID TEST
Influenza A: NEGATIVE
Influenza B: NEGATIVE

## 2018-12-31 LAB — CREATININE, SERUM
Creatinine: 1.3 mg/dL — ABNORMAL HIGH (ref 0.60–1.20)
EGFR: 34 mL/min/{1.73_m2} — ABNORMAL LOW (ref 60–150)

## 2018-12-31 LAB — B-TYPE NATRIURETIC PEPTIDE: B-Natriuretic Peptide: 192.4 pg/mL — ABNORMAL HIGH (ref 0.0–100.0)

## 2018-12-31 MED ORDER — ONDANSETRON HCL 4 MG/2ML IJ SOLN
4.00 mg | Freq: Three times a day (TID) | INTRAMUSCULAR | Status: DC | PRN
Start: 2018-12-31 — End: 2019-01-18

## 2018-12-31 MED ORDER — AMIODARONE HCL 200 MG PO TABS
100.0000 mg | ORAL_TABLET | Freq: Every day | ORAL | Status: DC
Start: 2019-01-01 — End: 2019-01-08
  Administered 2019-01-01 – 2019-01-08 (×8): 100 mg via ORAL
  Filled 2018-12-31 (×8): qty 1

## 2018-12-31 MED ORDER — ALBUTEROL SULFATE (2.5 MG/3ML) 0.083% IN NEBU
2.50 mg | INHALATION_SOLUTION | RESPIRATORY_TRACT | Status: DC
Start: 2018-12-31 — End: 2019-01-10
  Administered 2018-12-31 – 2019-01-10 (×59): 2.5 mg via RESPIRATORY_TRACT
  Filled 2018-12-31 (×74): qty 3

## 2018-12-31 MED ORDER — ASPIRIN EC 81 MG PO TBEC
81.00 mg | DELAYED_RELEASE_TABLET | Freq: Every morning | ORAL | Status: DC
Start: 2019-01-01 — End: 2019-01-18
  Administered 2019-01-02 – 2019-01-18 (×17): 81 mg via ORAL
  Filled 2018-12-31 (×18): qty 1

## 2018-12-31 MED ORDER — DORZOLAMIDE HCL 2 % OP SOLN
1.00 [drp] | Freq: Three times a day (TID) | OPHTHALMIC | Status: DC
Start: 2018-12-31 — End: 2019-01-18
  Administered 2018-12-31 – 2019-01-18 (×53): 1 [drp] via OPHTHALMIC
  Filled 2018-12-31 (×2): qty 200

## 2018-12-31 MED ORDER — ACETAMINOPHEN 325 MG PO TABS
650.0000 mg | ORAL_TABLET | ORAL | Status: DC | PRN
Start: 2018-12-31 — End: 2019-01-18
  Administered 2019-01-02: 650 mg via ORAL
  Filled 2018-12-31: qty 2

## 2018-12-31 MED ORDER — STERILE WATER FOR INJECTION IJ SOLN
2.00 g | INTRAMUSCULAR | Status: DC
Start: 2018-12-31 — End: 2019-01-02
  Administered 2018-12-31 – 2019-01-01 (×2): 2 g via INTRAVENOUS
  Filled 2018-12-31 (×2): qty 2000

## 2018-12-31 MED ORDER — SODIUM CHLORIDE 0.9 % IV MBP
100.00 mg | Freq: Two times a day (BID) | INTRAVENOUS | Status: DC
Start: 2018-12-31 — End: 2019-01-02
  Administered 2018-12-31 – 2019-01-02 (×4): 100 mg via INTRAVENOUS
  Filled 2018-12-31 (×4): qty 100

## 2018-12-31 MED ORDER — VH WARFARIN THERAPY PLACEHOLDER
1.00 | Status: DC
Start: 2018-12-31 — End: 2019-01-09

## 2018-12-31 MED ORDER — FUROSEMIDE 10 MG/ML IJ SOLN
INTRAMUSCULAR | Status: AC
Start: 2018-12-31 — End: ?
  Filled 2018-12-31: qty 4

## 2018-12-31 MED ORDER — PROCHLORPERAZINE EDISYLATE 10 MG/2ML IJ SOLN
5.00 mg | INTRAMUSCULAR | Status: DC | PRN
Start: 2018-12-31 — End: 2019-01-18

## 2018-12-31 MED ORDER — ALBUTEROL SULFATE (2.5 MG/3ML) 0.083% IN NEBU
2.50 mg | INHALATION_SOLUTION | Freq: Four times a day (QID) | RESPIRATORY_TRACT | Status: DC | PRN
Start: 2018-12-31 — End: 2019-01-18

## 2018-12-31 MED ORDER — FUROSEMIDE 10 MG/ML IJ SOLN
40.00 mg | Freq: Once | INTRAMUSCULAR | Status: AC
Start: 2018-12-31 — End: 2018-12-31
  Administered 2018-12-31: 13:00:00 40 mg via INTRAVENOUS

## 2018-12-31 MED ORDER — VH BIO-K PLUS PROBIOTIC 50 BIL CFU CAPSULE
50.00 | DELAYED_RELEASE_CAPSULE | Freq: Every day | ORAL | Status: DC
Start: 2018-12-31 — End: 2019-01-13
  Administered 2018-12-31 – 2019-01-12 (×13): 50 via ORAL
  Filled 2018-12-31 (×12): qty 1

## 2018-12-31 MED ORDER — FOLIC ACID 1 MG PO TABS
1.0000 mg | ORAL_TABLET | Freq: Every day | ORAL | Status: DC
Start: 2019-01-01 — End: 2019-01-18
  Administered 2019-01-01 – 2019-01-18 (×18): 1 mg via ORAL
  Filled 2018-12-31 (×18): qty 1

## 2018-12-31 MED ORDER — ACETAMINOPHEN 160 MG/5ML PO SOLN
650.00 mg | ORAL | Status: DC | PRN
Start: 2018-12-31 — End: 2019-01-18

## 2018-12-31 MED ORDER — SODIUM CHLORIDE (PF) 0.9 % IJ SOLN
3.00 mL | Freq: Three times a day (TID) | INTRAMUSCULAR | Status: DC
Start: 2018-12-31 — End: 2019-01-18
  Administered 2019-01-01 – 2019-01-18 (×53): 3 mL via INTRAVENOUS

## 2018-12-31 MED ORDER — METOPROLOL SUCCINATE ER 50 MG PO TB24
50.00 mg | ORAL_TABLET | Freq: Every morning | ORAL | Status: DC
Start: 2019-01-01 — End: 2019-01-16
  Administered 2019-01-01 – 2019-01-16 (×16): 50 mg via ORAL
  Filled 2018-12-31 (×16): qty 1

## 2018-12-31 MED ORDER — SODIUM CHLORIDE (PF) 0.9 % IJ SOLN
0.40 mg | INTRAMUSCULAR | Status: DC | PRN
Start: 2018-12-31 — End: 2019-01-18

## 2018-12-31 MED ORDER — VH POTASSIUM CHLORIDE CRYS ER 20 MEQ PO TBCR (WRAP)
20.00 meq | EXTENDED_RELEASE_TABLET | Freq: Every morning | ORAL | Status: DC
Start: 2019-01-01 — End: 2019-01-12
  Administered 2019-01-01 – 2019-01-11 (×11): 20 meq via ORAL
  Filled 2018-12-31 (×12): qty 1

## 2018-12-31 MED ORDER — PRAVASTATIN SODIUM 40 MG PO TABS
40.0000 mg | ORAL_TABLET | Freq: Every morning | ORAL | Status: DC
Start: 2019-01-01 — End: 2019-01-18
  Administered 2019-01-01 – 2019-01-18 (×18): 40 mg via ORAL
  Filled 2018-12-31 (×18): qty 1

## 2018-12-31 MED ORDER — FUROSEMIDE 10 MG/ML IJ SOLN
20.00 mg | Freq: Two times a day (BID) | INTRAMUSCULAR | Status: DC
Start: 2018-12-31 — End: 2019-01-04
  Administered 2018-12-31 – 2019-01-04 (×8): 20 mg via INTRAVENOUS
  Filled 2018-12-31 (×9): qty 2

## 2018-12-31 MED ORDER — ONDANSETRON 4 MG PO TBDP
4.00 mg | ORAL_TABLET | Freq: Three times a day (TID) | ORAL | Status: DC | PRN
Start: 2018-12-31 — End: 2019-01-18

## 2018-12-31 MED ORDER — ACETAMINOPHEN 650 MG RE SUPP
650.00 mg | RECTAL | Status: DC | PRN
Start: 2018-12-31 — End: 2019-01-18

## 2018-12-31 NOTE — ED Notes (Signed)
Bed: S27-A  Expected date:   Expected time:   Means of arrival:   Comments:  Ems

## 2018-12-31 NOTE — ED Triage Notes (Signed)
Patient presented in ED via EMS due to lethargy, fatigue with low saturation at scene, SPO2 was 82 % in RA at scene as per EMS.   Patient denied any chest pain or SOB, no fever. Able to speak in full sentences, Spo2 in RA on arrival was 80%, responded to oxygen 4-5 lpm per nasal cannula  Patient walks with walker and is taking warfarin however pt does not know the dosage of it and not on pt's med list from assisted living  A&Ox4

## 2018-12-31 NOTE — Plan of Care (Addendum)
NURSE NOTE SUMMARY  [facility] - GI/ENDO/GEN MED   Patient Name: [facility]   Attending Physician: Eben Burow, MD   Today's date:   12/31/2018 LOS: 0 days   Shift Summary:                                                              Assumed care of the patient. Patient currently on 6 liters nasal cannula. Patient on continuous pulse ox. Patient alert and oriented x4. Patient tachy at times. Patient's 02 sats drop on exertion. Patient educated to take deep breath through nose. Patient SATs increased. Patient assist x 1 to bedside commode. Patient complains of no pain. Patient medicated per MAR. Patient educated on call bell; bed alarm set.    Provider Notifications:      Rapid Response Notifications:  Mobility:      PMP Activity: Step 6 - Walks in Room (12/31/2018  6:24 PM)     Weight tracking:  Family Dynamic:   Last 3 Weights for the past 72 hrs (Last 3 readings):   Weight   12/31/18 1601 66.9 kg (147 lb 7.8 oz)   12/31/18 1055 69.9 kg (154 lb)             Recent Vitals Last Bowel Movement   BP: 106/60 (12/31/2018  3:58 PM)  Heart Rate: 83 (12/31/2018  3:58 PM)  Temp: 97.7 F (36.5 C) (12/31/2018  3:58 PM)  Resp Rate: 16 (12/31/2018  3:58 PM)  Height: 1.524 m (5') (stated) (12/31/2018  4:06 PM)  Weight: 66.9 kg (147 lb 7.8 oz) (12/31/2018  4:01 PM)  SpO2: 97 % (12/31/2018  3:58 PM)   No data recorded       Problem: Moderate/High Fall Risk Score >5  Description  Fall Risk Score > 5  Goal: Patient will remain free of falls  Outcome: Progressing     Problem: Compromised Tissue integrity  Goal: Damaged tissue is healing and protected  Description  Interventions:  1. Monitor/assess Braden scale every shift  2. Provide wound care per wound care algorithm  3. Reposition patient every 2 hours and as needed unless able to reposition self  4. Increase activity as tolerated/progressive mobility  5. Relieve pressure to bony prominences for patients at moderate and high risk  6. Avoid shearing injuries   7. Keep  intact skin clean and dry  8. Use bath wipes, not soap and water, for daily bathing   9. Use incontinence wipes for cleaning urine, stool and caustic drainage; Foley care as needed   10. Monitor external devices/tubes for correct placement to prevent pressure, friction and shearing   11. Encourage use of lotion/moisturizer on skin  12. Monitor patient's hygiene practices  13. Consult/collaborate with wound care nurse   14. Utilize specialty bed  15. Consider placing an indwelling catheter if incontinence interferes with healing of stage 3 or 4 pressure injury  Outcome: Progressing     Problem: Compromised Tissue integrity  Goal: Nutritional status is improving  Description  Interventions:  1. Assist patient with eating   2. Allow adequate time for meals   3. Encourage patient to take dietary supplement(s) as ordered   4. Collaborate with Clinical Nutritionist  5. Include patient/patient care companion in decisions related to nutrition  Outcome: Progressing

## 2018-12-31 NOTE — SLP Eval Note (Signed)
[facility]  Speech Language Pathology   Bedside Swallow Evaluation    Patient: Kelli Brown      CSN#: [id number]   Referring Physician:  Eben Burow, MD Date of Referral:  12/31/18    SLP reason:  Consult received for SLP Bedside Swallow Evaluation and Treatment for failed 3oz water screen.  Pt presented to ED with c/o fatigue and generalized weakness.  Pt admitted with hypoxia - Chest CT demonstrating biateral airspace disease/pneumonia or nonspecific pneumonitis.      ASSESSMENT/PLAN/RECOMMENDATIONS:     SLP diagnosis:  Mild Oropharyngeal Dysphagia    Diet Recommendations:  Regular diet/Thin liquids    Administration of Medications: Meds whole in applesauce/pureed    Precautions/Compensations:  Position patient upright 90 degrees for all po intake/meds, Keep upright >30 minutes following intake, Alternate liquids and solids (liquid wash), Take small bites/sips, Eat/feed slowly, Aspiration precautions, Reflux precautions  Suggestions for Feeding:  Distant supervision (Check on patient periodically throughout meal, provide reminders for use of strategies)  Prognosis:  Fair d/t advanced age  Aspiration Risk: age >25  Modified Barium Swallow Study recommended: no;Not indicated at this time  SLP Follow-Up Inpatient Treatment needs: 1-2x; diet tolerance assessment; education  Duration of Inpatient Treatment: Until swallowing managed  Projected Discharge Recommendations:  To be determined pending medical course; no further SLP needs are anticipated  Other referrals:  n/a    Goals:  Pt will:  STG:  (In 3-4 days)  1. Pt will tolerate a REGULAR diet with THIN liquids free of signs/symptoms of aspiration. (NEW)  2. Pt will use compensatory swallow strategies of Take small bites/sips, Eat/feed slowly during meals  Independently (NEW)  3. Pt/family/staff education (NEW)    LTG: (By discharge)  1. Pt will tolerate the least restrictive diet free of signs/symptoms of aspiration.  (NEW)      HISTORY OF PRESENT  ILLNESS:       Medical Diagnosis: Acute respiratory failure [J96.00]  Hypoxia [R09.02]  Pulmonary edema with congestive heart failure [I50.1]  Supratherapeutic international normalized ratio (INR) [R79.1]    History of Present Illness: Kelli Brown is a 85 y.o. female admitted on 12/31/2018 with   Patient Active Problem List   Diagnosis   . Cervical spine fracture   . Ocular hypertension of left eye   . Chronic midline low back pain without sciatica   . Nonrheumatic aortic (valve) stenosis        Past Medical/Surgical History:  Past Medical History:   Diagnosis Date   . Abnormal vision    . Arthritis    . Atrial fibrillation    . Congestive heart failure    . Glaucoma    . Hip fx, right, closed, initial encounter 2010   . Hyperlipidemia    . Hypertension    . Low back pain    . Macular degeneration    . Nonrheumatic aortic (valve) stenosis 08/11/2017   . Shingles       Past Surgical History:   Procedure Laterality Date   . APPENDECTOMY     . EYE SURGERY      cateracts   . HIP SURGERY Bilateral     Tubes tied   . TONSILLECTOMY     . TUBAL LIGATION           Baseline cognitive-communication/swallowing status:  Hx mild pill dysphagia (takes large pills in puree)    SUBJECTIVE:      Subjective: Patient is agreeable to participation in the therapy session.  Nursing clears patient for therapy. Patient's medical condition is appropriate for Speech therapy intervention at this time.    S:   "I just love fruit cocktail."      OBJECTIVE:     Observation of Patient/Vital Signs:    Positioning: Patient is upright in bed.  Respiratory status: 6L O2 via nasal cannula  Source of nutrition at time of eval:  NPO without access  Other medical equipment in place: IV, telemetry, O2 sat monitor   O:    Oral Motor/Swallow Skills:    Dentition: Dentures in place and well fitting  Strength:  WFL  Coordination:  Mild difficulty coordinating breathing with bolus manipulation/swallowing  ROM: WFL  Symmetry:  WFL   Oral Apraxia:   N/A  Sensation:  WFL  Gag Reflex:  Not assessed  Laryngeal Elev:  Timely initiation of the swallow reflex, +3-4 of 4 fingers width laryngeal elevation palpated  Cough/Grunt:  strong cough on command, reflexive cough not elicited  Motor Speech/Voice:  WFL for age/gender    Consistencies presented:  THIN LIQUID: straw sip x10   No signs/symptoms of aspiration, no change in respiratory status, no vocal change, timely initiation of the swallow reflex  PUREED: 1/2 tsp x2, 1 tsp x3   No signs/symptoms of aspiration, no change in respiratory status, no vocal change, timely initiation of the swallow reflex  MECHANICAL SOFT:  1/2 tsp x1, 1 tsp x2   No signs/symptoms of aspiration, no change in respiratory status, no vocal change, mildly prolonged but functional mastication  REGULAR: 1 tsp x4   No signs/symptoms of aspiration, no change in respiratory status, no vocal change, mildly prolonged but functional mastication    Pt/Family/Caregiver Education Provided:     Patient was/were educated re: role of slp, purpose of evaluation, sign/symptoms/risks of aspiration, safe feeding strategies, results/recommendations of today's evaluation  Good understanding was verbalized/demonstrated: yes  Comprehension limited by: None    Team Communication:     Spoke to : RN/LPN Dow Adolph;  CNA  Regarding: results/recommendations of today's evaluation, tx goals  Good understanding was verbalized: yes      Time of Treatment:   SLP Received On: 12/31/18  Start Time: 1703  Stop Time: 1722  Time Calculation (min): 19 min    SLP Visit Number: 1      Evaluation was completed by:  Harrold Donath, M.S., CCC-SLP  Speech-Language Pathologist, Shanon Ace  [facility]  Contact via [place], [place] Phone [phone], or Pager [phone]

## 2018-12-31 NOTE — ACP (Advance Care Planning) (Signed)
Advance Care Planning Services    Patient Name: [facility] WHITE LOS: 0 days   Attending Physician: Eben Burow, MD   Primary Care Physician: Flossie Dibble, MD   Narrative of Meeting   Events prompting this discussion:  Hospital admission  Acute respiratory failure  Pneumonia  CHF exacerbation  Advanced age     Major disease and the trajectory:  Worsening shortness of breath     The types of care discussed:  Standard of care for disease, Resuscitation with CPR and Intubation/TracheostomyTube     The Patient's/Surrogate's wishes and goals for treatment:  Patient wishes to be DNR.  She does not accept CPR, intubation, mechanical ventilation.  I reviewed her prior advanced directives and CODE STATUS which was listed as full code.  However she did not want to have CPR or life support.  She does not have any advanced directives filled out but she would like to have her granddaughter who lives at [place] [address]. Floy Sabina her power of attorney.   ED RN Aggie Cosier was present during the conversation. Active Problem List  Active Problems:    Acute exacerbation of CHF (congestive heart failure)        Documents Filled Out As Part of Discussion:                Orders placed in epic  Existing Documents that were reviewed and discussed with the patient/surrogate:  None   Acknowledgment of patient's right to decline advance care planning:  The patient has medical decision-making capacity and was represented by herself.  The patient/surrogate was informed that this session is completely voluntary and was given the opportunity to decline the ACP services. The patient/surrogate decided to participate in the ACP session with the discussion proceeding as described above.   I have spent 18 minutes on face-to-face Advance Care Planning Services   100% of the time was spent on discussion and counseling the patient.   No active management of the problems listed above was undertaken during the time period reported.       Medical  decision-making capacity can be determined by patient's ability to have:  1.     Factual understanding of current situation and issues;   2.     Interpretation of the information presented.   3.     Reasoning ability about decisions/choices.  4.     Execution of voluntary decision without coercion.   Advance care planning:             Improves end of life care            Improves patient & family satisfaction            Reduces stress, anxiety, & depression in surviving relatives  Reference:  Detering et al., BMJ 2010; 340 doi: PopPath.it (Published 13 February 2009)   Eben Burow, MD  12/31/18 5:37 PM  MRN: [id number]                                       CSN: [id number]                                             DOB: [birth year]

## 2018-12-31 NOTE — ED Provider Notes (Signed)
EMERGENCY DEPARTMENT  History and Physical Exam       Patient Name: Kelli Brown, Kelli Brown  Encounter Date:  12/31/2018  Attending Physician: Jane Canary, D.O.  PCP: Flossie Dibble, MD  Patient DOB:  October 31, 1919  MRN:  [id number]  Room:  528/528-A      History of Presenting Illness     Chief complaint: low saturation and Fatigue    HPI/ROS is limited by: none  HPI/ROS given by: patient, family and EMS    Kelli Brown is a 85 y.o. female who presents to the ED via EMS with complaint of generalized weakness. Patient states, "I just don't feel good." She has been very tired and sleepy. Patient has been experiencing symptoms in the last couple of days.  No reports of nausea and vomiting. Has had one episode of diarrhea yesterday morning. Patient is on HTN medication. Denies chest pain, SOB, fevers, chills or any urinary symptoms.     Review of Systems   Review of Systems   Constitutional: Positive for fatigue. Negative for appetite change, chills and fever.   HENT: Negative for congestion, facial swelling and sore throat.    Respiratory: Negative for cough, chest tightness, shortness of breath and wheezing.    Cardiovascular: Negative for chest pain, palpitations and leg swelling.   Gastrointestinal: Positive for diarrhea. Negative for abdominal pain, nausea and vomiting.   Genitourinary: Negative for difficulty urinating, dysuria, flank pain and hematuria.   Musculoskeletal: Negative for back pain and myalgias.   Skin: Negative for pallor.   Neurological: Negative for dizziness, weakness, light-headedness and headaches.   All other systems reviewed and are negative.       Other pertinent review of systems findings in history of present illness.  Allergies & Medications     Pt is allergic to codeine; darvon [propoxyphene]; and phenobarbital.    Current Discharge Medication List      CONTINUE these medications which have NOT CHANGED    Details   amiodarone (PACERONE) 100 MG tablet Take 100 mg by mouth daily      aspirin  EC 81 MG EC tablet Take 81 mg by mouth every morning.          calcium carbonate 1500 (600 Ca) MG Tab tablet Take 1,500 mg by mouth every morning      dorzolamide (TRUSOPT) 2 % ophthalmic solution Place 1 drop into the left eye 3 (three) times daily.         folic acid (FOLVITE) 1 MG tablet Take 1 tablet (1 mg total) by mouth daily  Qty: 90 tablet, Refills: 3      furosemide (LASIX) 40 MG tablet Take 40 mg by mouth Once each morning except Saturday and Sunday.      metoprolol XL (TOPROL-XL) 50 MG 24 hr tablet Take 50 mg by mouth every morning.          Multiple Vitamins-Minerals (CENTRUM WOMEN PO) Take 1 tablet by mouth every morning.      potassium chloride (K-DUR,KLOR-CON) 20 MEQ tablet Take 20 mEq by mouth every morning         pravastatin (PRAVACHOL) 40 MG tablet Take 40 mg by mouth every morning         vitamin D (CHOLECALCIFEROL) 25 MCG (1000 UT) tablet Take 1,000 Units by mouth every morning      warfarin (COUMADIN) 5 MG tablet Take by mouth See Admin Instructions 2.5mg  on all days EXCEPT 5mg  on Thu and Sat  Past Medical History     Pt   Past Medical History:   Diagnosis Date    Abnormal vision     Arthritis     Atrial fibrillation     Congestive heart failure     Glaucoma     Hip fx, right, closed, initial encounter 2010    Hyperlipidemia     Hypertension     Low back pain     Macular degeneration     Nonrheumatic aortic (valve) stenosis 08/11/2017    Shingles         Past Surgical History     Pt   Past Surgical History:   Procedure Laterality Date    APPENDECTOMY      EYE SURGERY      cateracts    HIP SURGERY Bilateral     Tubes tied    TONSILLECTOMY      TUBAL LIGATION          Family History     The family history includes CABG in her brother; Cancer in her father; Coronary artery disease in her mother; Heart disease in her mother; Hypertension in her mother; Stroke in her mother and sister.     Social History     Pt reports that she has never smoked. She has never used  smokeless tobacco. She reports that she does not drink alcohol or use drugs.     Physical Exam     Blood pressure 126/62, pulse 86, temperature 97.5 F (36.4 C), temperature source Axillary, resp. rate 16, height 1.524 m, weight 68 kg, SpO2 91 %.    Vitals signs reviewed.    Physical Exam  Vitals signs and nursing note reviewed.   HENT:      Head: Normocephalic.      Right Ear: External ear normal.      Left Ear: External ear normal.      Nose: Nose normal.      Mouth/Throat:      Pharynx: Oropharynx is clear.   Eyes:      Extraocular Movements: Extraocular movements intact.   Neck:      Musculoskeletal: Normal range of motion and neck supple.   Cardiovascular:      Rate and Rhythm: Normal rate and regular rhythm.      Pulses: Normal pulses.      Heart sounds: Normal heart sounds. No murmur.   Pulmonary:      Effort: Pulmonary effort is normal. No respiratory distress.      Breath sounds: Rales present. No wheezing or rhonchi.   Abdominal:      General: Bowel sounds are normal.      Tenderness: There is no abdominal tenderness. There is no right CVA tenderness or left CVA tenderness.   Musculoskeletal: Normal range of motion.         General: No tenderness.      Right lower leg: No edema.      Left lower leg: No edema.   Skin:     General: Skin is warm and dry.      Coloration: Skin is not pale.   Neurological:      Mental Status: She is alert.            Diagnostic Results     The results of the diagnostic studies have been reviewed by myself:    Radiologic Studies  Show images for XR Chest 2 Views   XR Chest 2 Views [IMG36] (Order [id number])  Status: Final result   Study Result     Clinical History:  Shortness of breath    Examination:  Frontal and lateral views of the chest.    Comparison:  09 February 2018 and earlier studies    Findings:  Heart size is stable. There is a small right pleural effusion. The interstitium is slightly prominent. There is patchy airspace disease in both upper lung zones, worse on  the left. No acute skeletal abnormalities are seen.    IMPRESSION:   Multifocal pneumonia versus edema: Clinical correlation is recommended.    ReadingStation:[id number]   Imaging     XR Chest 2 Views (Order: [id number]) - 12/31/2018   Result History     XR Chest 2 Views (Order #[id number]) on 12/31/2018 - Order Result History Report       CT Chest WO Contrast [IMG200] (Order [id number])   Status: Final result   Study Result     Clinical History:  Respiratory illness, acute (Age > 40y)    Study Notes:   [facility] due to  lethargy, with  faigue & weakn-  ess for few  days. SPO2 at  scene as per  EMS was 82% in  RA responded to  oxygen 6LPM     Examination:   CT CHEST WO CONTRAST    Examination:  CT of the chest without contrast. Multiplanar reconstructions obtained.  CT images were acquired utilizing Automated Exposure Control for dose reduction.     Comparison:  12/31/2018 CXR    Findings:    Small bilateral pleural effusions with partial bibasilar atelectasis.     Bilateral airspace disease with groundglass densities, greatest of the upper lobes left more than right, though also including the lower lobes.    Mild interlobular and interseptal thickening from superimposed mild pulmonary edema.    Mildly prominent, though subcentimeter thoracic nodes. No adenopathy, though limited noncontrast assessment    Advanced calcifications at the mitral location and aortic root. No pericardial effusion.    Grossly unremarkable limited upper abdomen. Diffuse osteopenia. No acute osseous abnormality.      IMPRESSION:     1. Bilateral airspace disease/pneumonia or nonspecific pneumonitis. Correlate with clinical findings and recommend follow-up to resolution.    2. Superimposed CHF with mild pulmonary edema and small bilateral pleural effusions.        ReadingStation:[id number]   Imaging     CT Chest WO Contrast (Order: [id number]) - 12/31/2018   Result History     CT Chest WO Contrast  (Order #[id number]) on 12/31/2018 - Order Result History Report       12/31/2018 12:26 PM - Interface, [employer] In     [employer] Value Ref Range & Units Status   PT 85.1High   9.5 - 11.5 sec Final   PT INR 8.8High Panic   0.5 - 1.3 Final   INR Interpretive Comment:     Clinical Condition    INR Range   Deep Venous Thrombosis  2.0 - 3.0   Pulmonary Embolism    2.0 - 3.0   Atrial Fibrillation    2.0 - 3.0   Prosthetic Heart Value  2.5 - 3.5   Results called and read back by (licensed clinician/date/time/tech): Leonides Cave, RN. 12-31-18, 12:24. #71   The above 2 analytes were performed by [facility] Main Lab [phone])   [address] [address]    Lab and Collection  Prothrombin time/INR - 12/31/2018       12/31/2018 12:10 PM - Interface, [facility] In     [employer] Value Ref Range & Units Status   B-Natriuretic Peptide 192.4High   0.0 - 100.0 pg/mL Final   The above 1 analytes were performed by [facility] Main Lab [phone])   [address],[place] [address]    Lab and Collection     B-type Natriuretic Peptide - 12/31/2018     12/31/2018 12:01 PM - Interface, [facility] In     [employer] Value Ref Range & Units Status   Troponin I 0.01  0.00 - 0.02 ng/mL Final   Presence of heterophile antibodies have been known to cause erroneous results.   The above 1 analytes were performed by [facility] Main Lab [phone])   [address] [address]    Lab and Collection     Troponin I - 12/31/2018     12/31/2018 11:57 AM - Interface, [facility] In     [employer] Value Ref Range & Units Status   Sodium 142  136 - 147 mMol/L Final   Potassium 4.7  3.5 - 5.3 mMol/L Final   Chloride 103  98 - 110 mMol/L Final   CO2 24  20 - 30 mMol/L Final   Calcium 9.0  8.5 - 10.5 mg/dL Final   Glucose 98  71 - 99 mg/dL Final   Creatinine 1.31High   0.60 - 1.20 mg/dL Final   BUN 20  7 - 22 mg/dL Final   Anion Gap 44.0NUUV   7.0 - 18.0 mMol/L Final   BUN/Creatinine Ratio 15.3   10.0 - 30.0 Ratio Final   EGFR 34Low   60 - 150 mL/min/1.48m2 Final   eGFR determined using CKD-EPI equation   Osmolality Calculated 286  275 - 300 mOsm/kg Final   The above 12 analytes were performed by [facility] Main Lab [phone])   [address] [address]    Lab and Collection     Basic Metabolic Panel - 02/27/4258     03/28/3874 12:57 PM - Interface, [facility] In     [employer] Value Ref Range & Units Status   WBC 7.8  4.0 - 11.0 K/cmm Final   RBC 3.46Low   3.80 - 5.00 M/cmm Final   Hemoglobin 11.1Low   12.0 - 16.0 gm/dL Final   Hematocrit 34.9Low   36.0 - 48.0 % Final   MCV 101High   80 - 100 fL Final   MCH 32  28 - 35 pg Final   MCHC 32  32 - 36 gm/dL Final   RDW 64.3PIRJ   11.0 - 14.0 % Final   PLT CT 259  130 - 440 K/cmm Final   MPV 7.0  6.0 - 10.0 fL Final   NEUTROPHIL % 77.7  42.0 - 78.0 % Final   Lymphocytes 11.9Low   15.0 - 46.0 % Final   Monocytes 8.4  3.0 - 15.0 % Final   Eosinophils % 1.7  0.0 - 7.0 % Final   Basophils % 0.3  0.0 - 3.0 % Final   Neutrophils Absolute 6.1  1.7 - 8.6 K/cmm Final   Lymphocytes Absolute 0.9  0.6 - 5.1 K/cmm Final   Monocytes Absolute 0.7  0.1 - 1.7 K/cmm Final   Eosinophils Absolute 0.1  0.0 - 0.8 K/cmm Final   BASO Absolute 0.0  0.0 - 0.3 K/cmm Final   RBC Morphology RBC Morphology Reviewed   Final  Macrocytic 1+   Final   Anisocytosis 2+   Final   The above 23 analytes were performed by [facility] Main Lab [phone])   [address] [address]    Lab and Collection     CBC and differential - 12/31/2018     12/31/2018 11:36 AM - Interface, [facility] In     [employer] Value Ref Range & Units Status   pH, ISTAT 7.44  7.35 - 7.45 Final   PO2, ISTAT 54Low   75 - 100 mm Hg Final   BE, ISTAT -1  mMol/L Final   HCO3, ISTAT 23.3  20.0 - 29.0 mMol/L Final   PCO2, ISTAT 34.4Low   35.0 - 45.0 mm Hg Final   O2 Sat, %, ISTAT 89Low   96 - 100 % Final   Room Number, ISTAT 27   Final   i-STAT Allen's Test Pass   Final   DELS, ISTAT Nasal  Can   Final   i-STAT FIO2 32.00  % Final   i-STAT Liters Per Minute 3  L/min Final   Sample, ISTAT Arterial   Final   Site, ISTAT R Radial   Final   TCO2, ISTAT 24  24 - 29 mMol/L Final   i-STAT Lactic acid 0.76  0.50 - 2.10 mMol/L Final   Operator, ISTAT   Final   Operator: [phone] LIGHT KRISTIN RT    The above 16 analytes were performed by [facility] Main Lab [phone])   [address],[place] [address]    Lab and Collection     i-Stat CG4 Arterial CartrIDge - 12/31/2018       12/31/2018 1:37 PM - Interface, [facility] In     Specimen Information: Urine, Random       Component Value Ref Range & Units Status   Color, UA Yellow  Colorless,Yellow,Straw Final   Clarity, UA Clear  Clear Final   Specific Gravity, UR 1.012  1.001 - 1.040 Final   pH, Urine 7.0  5.0 - 8.0 pH Final   Protein, UR Negative  Negative mg/dL Final   Glucose, UA Negative  Negative mg/dL Final   Ketones UA 5  Negative,5 mg/dL Final   Bilirubin, UA Negative  Negative Final   Blood, UA ModerateAbnormal   Negative Final   Nitrite, UA Negative  Negative Final   Urobilinogen, UA Normal  Normal mg/dL Final   Leukocyte Esterase, UA Negative  Negative Leu/uL Final   UR Micro Performed   Final   WBC, UA 1  0 - 4 /hpf Final   RBC, UA 14High   0 - 5 /hpf Final   Bacteria, UA RareAbnormal   None /hpf Final   The above 16 analytes were performed by [facility] Main Lab [phone])   [address],[place] [address]    Narrative     This order is a replacement of the rejected order with accession number [id number].   Lab and Collection     Urinalysis w Microscopic and Culture if Indicated - 12/31/2018   Result History         Last EKG Result     Procedure Component Value Units Date/Time    ECG 12 lead (Stat) (Cardiac Related) [[id number]] Collected:  12/31/18 1102     Updated:  12/31/18 1119     Patient Age 89 years      Patient DOB Nov 12, 1919     Patient Height --     Patient Weight --  Interpretation Text --     Sinus rhythm  Rate 89  No acute STT changes  Normal intervals and axis  Compared to ECG 03/09/2018 23:43:47  No significant changes    Electronically Signed On 12-31-2018 11:17:27 EST by Parke Poisson Wie       Physician Interpreter Parke Poisson [facility]     Ventricular Rate 89 //min      QRS Duration 84 ms      P-R Interval 160 ms      Q-T Interval 360 ms      Q-T Interval(Corrected) 439 ms      P Wave Axis 56 deg      QRS Axis 25 deg      T Axis 60 years           This EKG was formally interpreted by me at time of care.              Consultation     CONSULT  01/07/2019 1:51 PM : Placed call to [facility].    Dr. Ladoris Gene will admit.     1:51 PM  The patients history, PE, diagnostic studies and ED course were discussed and reviewed with the consultant who agrees to see the patient and admit to the hospital.        Medical Decision Making     Patient is a 85 year old female with a history of CHF who presents emergency department today complaining of increasing fatigue and sleepiness.  Frontal diagnosis includes but is not limited to viral syndrome, CHF, hypoxia, hypercarbia, infection, pneumonia, UTI, metabolic derangements.    She was found in the emergency department to have supratherapeutic INR and evidence of pulmonary edema on her chest x-ray and CT scan.  As the patient is otherwise stable and not having any active bleeding her Coumadin will be held.  She will be monitored for any bleeding.  She will be given furosemide IV to help begin diuresis and admit the patient to the hospital.      ED Medication Orders (From admission, onward)    Start Ordered     Status Ordering Provider    12/31/18 1230 12/31/18 1229  furosemide (LASIX) injection 40 mg  Once in ED     Route: Intravenous  Ordered Dose: 40 mg     Last MAR action:  Given VAN WIE, Janal Haak F JR.              In addition to the above history, please see nursing notes. Allergies, meds, past medical, family, social hx, and the results of the diagnostic studies performed have been  reviewed by myself.     Diagnosis / Disposition     Clinical Impression  1. Hypoxia    2. Pulmonary edema with congestive heart failure    3. Supratherapeutic international normalized ratio (INR)        Disposition  ED Disposition     ED Disposition Condition Date/Time Comment    Admit  Sat Dec 31, 2018  1:41 PM             Follow up for Discharged Patients  No follow-up provider specified.    Prescriptions for Discharged Patients  Current Discharge Medication List                          Procedures     None          Claudius Sis Finley, D.O.  ATTESTATIONS      The documentation recorded by my scribe, Wandra Feinstein, accurately reflects the services I personally performed and the decisions made by me.  Jane Canary, D.O.    Note:  This chart was generated by the Epic EMR system/speech recognition and may contain inherent errors or omissions not intended by the user. Grammatical errors, random word insertions, deletions, pronoun errors and incomplete sentences are occasional consequences of this technology due to software limitations. Not all errors are caught or corrected. If there are questions or concerns about the content of this note or information contained within the body of this dictation they should be addressed directly with the author for clarification        Izora Gala., DO  01/07/19 1543

## 2018-12-31 NOTE — H&P (Addendum)
Medicine History & Physical   [facility], [place]   Patient Name: Kelli Brown LOS: 0 days   Attending Physician: Linna Darner, Hardie Shackleton., DO PCP: Flossie Dibble, MD      Assessment and Plan:                                                              Acute hypoxic respiratory failure likely due to pneumonia/diastolic heart failure  Wean off oxygen as tolerated    Pneumonia  Continue ceftriaxone, doxycycline  Monitor respiratory status closely    Acute on chronic diastolic heart failure/moderate aortic stenosis  Start Lasix 20 mg IV twice daily  Strict I's/O, daily weights  Will order Echocardiogram    Paroxysmal atrial fibrillation  Continue amiodarone, metoprolol, warfarin  Pharmacy to dose    Supra therapeutic INR  Hold warfarin    Hypertension controlled  Continue metoprolol     Hyperlipidemia  Continue statin    DVT PPx: Anticoagulated   Dispo: Inpatient  Healthcare Proxy: Granddaughter  Code: do not resuscitate     History of Presenting Illness                                CC: Shortness of breath  Kelli Brown is a 85 y.o. female patient admitted for the complaints of worsening shortness of breath for about a week associated with generalized weakness.  Patient denies any leg swelling, chest pain, dizziness, palpitations, nausea, vomiting, fever, chills, diarrhea or constipation.  She denies any changes in urinary output.  She is from [facility] assisted living facility.  His walker  Past Medical History:   Diagnosis Date    Abnormal vision     Arthritis     Atrial fibrillation     Congestive heart failure     Glaucoma     Hip fx, right, closed, initial encounter 2010    Hyperlipidemia     Hypertension     Low back pain     Macular degeneration     Nonrheumatic aortic (valve) stenosis 08/11/2017    Shingles      Past Surgical History:   Procedure Laterality Date    APPENDECTOMY      EYE SURGERY      cateracts    HIP SURGERY Bilateral     Tubes tied     TONSILLECTOMY      TUBAL LIGATION         Family History   Problem Relation Age of Onset    Stroke Mother     Hypertension Mother     Coronary artery disease Mother     Heart disease Mother     Cancer Father     Stroke Sister     CABG Brother      Social History     Tobacco Use    Smoking status: Never Smoker    Smokeless tobacco: Never Used   Substance Use Topics    Alcohol use: No    Drug use: No       Subjective   Review of Systems:  All systems were reviewed and are negative unless pertinent positive stated in HPI.  CONSTITUTIONAL: No night  sweats. c/o fatigue. No fever or chills.   Eyes: No visual changes. No eye pain.   ENT: No runny nose. No epistaxis.   RESPIRATORY: No cough. No hemoptysis. c/o shortness of breath.   CARDIOVASCULAR: No chest pains. No palpitations.   GASTROINTESTINAL: No abdominal pain. No vomiting. No diarrhea or constipation.   GENITOURINARY: No urgency. No frequency. No dysuria.   MUSCULOSKELETAL: No musculoskeletal pain. No joint swelling.   NEUROLOGICAL: No headache or neck pain. No syncope or seizure.   PSYCHIATRIC: No depression, no psychosis.  SKIN: No rashes. No lesions. No petechiae.  ENDOCRINE: No unexplained weight loss. No polydipsia.   HEMATOLOGIC: No anemia. No purpura. No bleeding.   ALLERGIC AND IMMUNOLOGIC: No pruritus. No swelling.         Objective   Physical Exam:     Vitals: T:98.1 F (36.7 C) (Oral),  BP:139/48, HR:83, RR:15, SaO2:93%    1) General Appearance: Alert and oriented x 4. In no acute distress.   2) Eyes: Pink conjunctiva, anicteric sclera. Pupils are equally reactive to light.  3) ENT: Oral mucosa moist with no pharyngeal congestion, erythema or swelling.  4) Neck: Supple, with full range of motion. Trachea is central, no JVD noted  5) Chest: Bilateral basal rales  6) CVS: normal rate and regular rhythm, with systolic murmur  7) Abdomen: Soft, non-tender, no palpable mass. Bowel sounds normal. No CVA tenderness  8) Extremities: No pitting  edema, pulses palpable, no calf swelling and no gross deformity.  9) Skin: Warm, dry with normal skin turgor, no rash  10) Lymphatics: No lymphadenopathy in axillary, cervical and inguinal area.   11) Neurological: Cranial nerves II-XII intact. No gross focal motor or sensory deficits noted.  12) Psychiatric: Affect is appropriate. No hallucinations.    EKG: Per my review shows sinus rhythm  Chest Xray: Per my review shows patchy airspace disease in bilateral upper lung zones    Patient Vitals for the past 12 hrs:   BP Temp Pulse Resp   12/31/18 1406 139/48  83 15   12/31/18 1252 136/57  80 15   12/31/18 1246   85 20   12/31/18 1143 147/63  87 20   12/31/18 1055 136/53 98.1 F (36.7 C) 92 18     Weight Monitoring 11/22/2017 11/22/2017 02/09/2018 02/28/2018 03/09/2018 03/16/2018 12/31/2018   Height 160 cm 152.4 cm 152.4 cm 152.4 cm 157.5 cm 152.4 cm 152.4 cm   Height Method Stated Stated Stated Stated Stated - Stated   Weight 71 kg 67.858 kg 68.04 kg 68.04 kg 68.04 kg 70.126 kg 69.854 kg   Weight Method Stated Standing Scale Stated Stated Stated - Stated   BMI (calculated) 27.8 kg/m2 29.3 kg/m2 29.4 kg/m2 29.4 kg/m2 27.5 kg/m2 30.3 kg/m2 30.1 kg/m2          Recent Results (from the past 24 hour(s))   ECG 12 lead (Stat) (Cardiac Related)    Collection Time: 12/31/18 11:02 AM   Result Value Ref Range    Patient Age 85 years    Patient DOB 1919-02-05     Patient Height      Patient Weight      Interpretation Text       Sinus rhythm Rate 89  No acute STT changes  Normal intervals and axis  Compared to ECG 03/09/2018 23:43:47  No significant changes    Electronically Signed On 12-31-2018 11:17:27 EST by Parke Poisson Wie      Physician Interpreter Parke Poisson Wie  Ventricular Rate 89 //min    QRS Duration 84 ms    P-R Interval 160 ms    Q-T Interval 360 ms    Q-T Interval(Corrected) 439 ms    P Wave Axis 56 deg    QRS Axis 25 deg    T Axis 60 years   CBC and differential    Collection Time: 12/31/18 11:08 AM   Result Value  Ref Range    WBC 7.8 4.0 - 11.0 K/cmm    RBC 3.46 (L) 3.80 - 5.00 M/cmm    Hemoglobin 11.1 (L) 12.0 - 16.0 gm/dL    Hematocrit 16.1 (L) 36.0 - 48.0 %    MCV 101 (H) 80 - 100 fL    MCH 32 28 - 35 pg    MCHC 32 32 - 36 gm/dL    RDW 09.6 (H) 04.5 - 14.0 %    PLT CT 259 130 - 440 K/cmm    MPV 7.0 6.0 - 10.0 fL    NEUTROPHIL % 77.7 42.0 - 78.0 %    Lymphocytes 11.9 (L) 15.0 - 46.0 %    Monocytes 8.4 3.0 - 15.0 %    Eosinophils % 1.7 0.0 - 7.0 %    Basophils % 0.3 0.0 - 3.0 %    Neutrophils Absolute 6.1 1.7 - 8.6 K/cmm    Lymphocytes Absolute 0.9 0.6 - 5.1 K/cmm    Monocytes Absolute 0.7 0.1 - 1.7 K/cmm    Eosinophils Absolute 0.1 0.0 - 0.8 K/cmm    BASO Absolute 0.0 0.0 - 0.3 K/cmm    RBC Morphology RBC Morphology Reviewed     Macrocytic 1+     Anisocytosis 2+    Basic Metabolic Panel    Collection Time: 12/31/18 11:08 AM   Result Value Ref Range    Sodium 142 136 - 147 mMol/L    Potassium 4.7 3.5 - 5.3 mMol/L    Chloride 103 98 - 110 mMol/L    CO2 24 20 - 30 mMol/L    Calcium 9.0 8.5 - 10.5 mg/dL    Glucose 98 71 - 99 mg/dL    Creatinine 4.09 (H) 0.60 - 1.20 mg/dL    BUN 20 7 - 22 mg/dL    Anion Gap 81.1 (H) 7.0 - 18.0 mMol/L    BUN/Creatinine Ratio 15.3 10.0 - 30.0 Ratio    EGFR 34 (L) 60 - 150 mL/min/1.36m2    Osmolality Calculated 286 275 - 300 mOsm/kg   Troponin I    Collection Time: 12/31/18 11:08 AM   Result Value Ref Range    Troponin I 0.01 0.00 - 0.02 ng/mL   B-type Natriuretic Peptide    Collection Time: 12/31/18 11:08 AM   Result Value Ref Range    B-Natriuretic Peptide 192.4 (H) 0.0 - 100.0 pg/mL   Prothrombin time/INR    Collection Time: 12/31/18 11:08 AM   Result Value Ref Range    PT 85.1 (H) 9.5 - 11.5 sec    PT INR 8.8 (HH) 0.5 - 1.3   Influenza A / B Rapid Test    Collection Time: 12/31/18 11:08 AM   Result Value Ref Range    Influenza A Negative Negative    Influenza B Negative Negative   i-Stat CG4 Arterial CartrIDge    Collection Time: 12/31/18 11:29 AM   Result Value Ref Range    pH, ISTAT 7.44  7.35 - 7.45    PO2, ISTAT 54 (L) 75 - 100 mm Hg  BE, ISTAT -1 mMol/L    HCO3, ISTAT 23.3 20.0 - 29.0 mMol/L    PCO2, ISTAT 34.4 (L) 35.0 - 45.0 mm Hg    O2 Sat, %, ISTAT 89 (L) 96 - 100 %    Room Number, ISTAT 27     i-STAT Allen's Test Pass     DELS, ISTAT Nasal Can     i-STAT FIO2 32.00 %    i-STAT Liters Per Minute 3 L/min    Sample, ISTAT Arterial     Site, ISTAT R Radial     TCO2, ISTAT 24 24 - 29 mMol/L    i-STAT Lactic acid 0.76 0.50 - 2.10 mMol/L    Operator, ISTAT Operator: [id number] LIGHT KRISTIN  RT    Urinalysis w Microscopic and Culture if Indicated    Collection Time: 12/31/18  1:10 PM   Result Value Ref Range    Color, UA Yellow Colorless,Yellow,Straw    Clarity, UA Clear Clear    Specific Gravity, UR 1.012 1.001 - 1.040    pH, Urine 7.0 5.0 - 8.0 pH    Protein, UR Negative Negative mg/dL    Glucose, UA Negative Negative mg/dL    Ketones UA 5 Negative,5 mg/dL    Bilirubin, UA Negative Negative    Blood, UA Moderate (A) Negative    Nitrite, UA Negative Negative    Urobilinogen, UA Normal Normal mg/dL    Leukocyte Esterase, UA Negative Negative Leu/uL    UR Micro Performed     WBC, UA 1 0 - 4 /hpf    RBC, UA 14 (H) 0 - 5 /hpf    Bacteria, UA Rare (A) None /hpf        Allergies   Allergen Reactions    Codeine Nausea And Vomiting and Other (See Comments)     LIGHTHEADEDNESS.    Darvon [Propoxyphene] Nausea And Vomiting and Other (See Comments)     LIGHTHEADEDNESS.    Phenobarbital Swelling and Rash      Xr Chest 2 Views    Result Date: 12/31/2018  Multifocal pneumonia versus edema: Clinical correlation is recommended. ReadingStation:[email or web address]     Home Medications     Med List Status:  In Progress Set By: Rosalita Chessman, RN at 12/31/2018 11:05 AM        Status Comment        12/31/2018  1:17 PM    Resident of [facility] in [place], [place]. Per [facility] staff, med lists are not updated if pt self-administers medications. Ms. Kooy self administers medications and therefore warfarin  and amiodarone are not on her [facility] list                  amiodarone (PACERONE) 100 MG tablet     Take 100 mg by mouth daily     aspirin EC 81 MG EC tablet     Take 81 mg by mouth every morning.         calcium carbonate 1500 (600 Ca) MG Tab tablet     Take 1,500 mg by mouth every morning     dorzolamide (TRUSOPT) 2 % ophthalmic solution     Place 1 drop into the left eye 3 (three) times daily.        folic acid (FOLVITE) 1 MG tablet     Take 1 tablet (1 mg total) by mouth daily     furosemide (LASIX) 40 MG tablet     Take 40 mg by mouth Once  each morning except Saturday and "Sunday.     metoprolol XL (TOPROL-XL) 50 MG 24 hr tablet     Take 50 mg by mouth every morning.         Multiple Vitamins-Minerals (CENTRUM WOMEN PO)     Take 1 tablet by mouth every morning.     potassium chloride (K-DUR,KLOR-CON) 20 MEQ tablet     Take 20 mEq by mouth every morning        pravastatin (PRAVACHOL) 40 MG tablet     Take 40 mg by mouth every morning        vitamin D (CHOLECALCIFEROL) 25 MCG (1000 UT) tablet     Take 1,000 Units by mouth every morning     warfarin (COUMADIN) 5 MG tablet     Take by mouth See Admin Instructions 2.5mg on all days EXCEPT 5mg on Thu and Sat                  Ongoing Comment    Stottlemyer, Eric, PharmD    12/31/2018  1:21 PM    12/31/2018 - Resident of [facility] in [place], [place]. Per [facility] staff, med lists are not updated if pt self-administers medications. Ms. Zwiebel self administers medications and therefore warfarin and amiodarone are not on her [facility] list. May wish to follow-up with Dr. Grant's office during normal business hours         Meds given in the ED:  Medications   furosemide (LASIX) injection 40 mg (40 mg Intravenous Given 12/31/18 1249)      Time Spent:      , MD     02" /08/20,2:29 PM   MRN: [id number]                                      CSN: [id number] DOB: 1919/05/20

## 2018-12-31 NOTE — Progress Note - Problem Oriented Charting Notewrit (Signed)
Pharmacy Consult Warfarin Dosing  Kelli Brown    Age: 85 y.o.  Weight: 69.9 kg (154 lb)  Indication:   Goal INR: 2-3  Home warfarin dose =   2.5 mg daily except 5mg  Thurs and Sat  Interacting agent/disease:     Subjective/Objective:   89 yof admitted from assisted living with hypoxia and lethargy. Takes warfarin as outpatient reportedly at above dosage, however it is not on patient's home med list, per facility home med list is not regularly updated    Assessment/Plan:   INR on admission = 8.8.   Hold warfarin today for supra-therapeutic INR. No documented signs/symptoms of bleeding.   Daily INR   Monitor for signs and symptoms of bleeding   Avoid IM injections      Current warfarin dose =(hold for INR greater than 3.5)  Date 2/8           INR 8.8           dose Hold           Hgb 11.1           Plt 259               Past Medical History:   Diagnosis Date    Abnormal vision     Arthritis     Atrial fibrillation     Congestive heart failure     Glaucoma     Hip fx, right, closed, initial encounter 2010    Hyperlipidemia     Hypertension     Low back pain     Macular degeneration     Nonrheumatic aortic (valve) stenosis 08/11/2017    Shingles                     If you have any questions, please contact the pharmacist at [phone].    Miachel Roux, PharmD

## 2019-01-01 LAB — PROCALCITONIN: Procalcitonin: 0.25 ng/mL — ABNORMAL HIGH (ref 0.00–0.24)

## 2019-01-01 LAB — MAGNESIUM: Magnesium: 2 mg/dL (ref 1.6–2.6)

## 2019-01-01 LAB — CBC AND DIFFERENTIAL
Basophils %: 0.1 % (ref 0.0–3.0)
Basophils Absolute: 0 10*3/uL (ref 0.0–0.3)
Eosinophils %: 1 % (ref 0.0–7.0)
Eosinophils Absolute: 0.1 10*3/uL (ref 0.0–0.8)
Hematocrit: 31.8 % — ABNORMAL LOW (ref 36.0–48.0)
Hemoglobin: 10.3 gm/dL — ABNORMAL LOW (ref 12.0–16.0)
Lymphocytes Absolute: 0.8 10*3/uL (ref 0.6–5.1)
Lymphocytes: 9.8 % — ABNORMAL LOW (ref 15.0–46.0)
MCH: 32 pg (ref 28–35)
MCHC: 32 gm/dL (ref 32–36)
MCV: 100 fL (ref 80–100)
MPV: 7.2 fL (ref 6.0–10.0)
Monocytes Absolute: 0.9 10*3/uL (ref 0.1–1.7)
Monocytes: 9.9 % (ref 3.0–15.0)
Neutrophils %: 79.2 % — ABNORMAL HIGH (ref 42.0–78.0)
Neutrophils Absolute: 6.8 10*3/uL (ref 1.7–8.6)
PLT CT: 245 10*3/uL (ref 130–440)
RBC: 3.18 10*6/uL — ABNORMAL LOW (ref 3.80–5.00)
RDW: 17.6 % — ABNORMAL HIGH (ref 11.0–14.0)
WBC: 8.6 10*3/uL (ref 4.0–11.0)

## 2019-01-01 LAB — PT/INR
PT INR: 9.7 (ref 0.5–1.3)
PT: 93 s — ABNORMAL HIGH (ref 9.5–11.5)

## 2019-01-01 LAB — HEPATIC FUNCTION PANEL
ALT: 9 U/L (ref 0–55)
AST (SGOT): 20 U/L (ref 10–42)
Albumin/Globulin Ratio: 0.79 Ratio — ABNORMAL LOW (ref 0.80–2.00)
Albumin: 2.7 gm/dL — ABNORMAL LOW (ref 3.5–5.0)
Alkaline Phosphatase: 85 U/L (ref 40–145)
Bilirubin Direct: 0.3 mg/dL (ref 0.0–0.3)
Bilirubin, Total: 0.7 mg/dL (ref 0.1–1.2)
Globulin: 3.4 gm/dL (ref 2.0–4.0)
Protein, Total: 6.1 gm/dL (ref 6.0–8.3)

## 2019-01-01 LAB — BASIC METABOLIC PANEL
Anion Gap: 15.7 mMol/L (ref 7.0–18.0)
BUN / Creatinine Ratio: 15.3 Ratio (ref 10.0–30.0)
BUN: 20 mg/dL (ref 7–22)
CO2: 24 mMol/L (ref 20–30)
Calcium: 8.9 mg/dL (ref 8.5–10.5)
Chloride: 102 mMol/L (ref 98–110)
Creatinine: 1.31 mg/dL — ABNORMAL HIGH (ref 0.60–1.20)
EGFR: 34 mL/min/{1.73_m2} — ABNORMAL LOW (ref 60–150)
Glucose: 118 mg/dL — ABNORMAL HIGH (ref 71–99)
Osmolality Calculated: 279 mOsm/kg (ref 275–300)
Potassium: 3.7 mMol/L (ref 3.5–5.3)
Sodium: 138 mMol/L (ref 136–147)

## 2019-01-01 LAB — PHOSPHORUS: Phosphorus: 2.9 mg/dL (ref 2.3–4.7)

## 2019-01-01 MED ORDER — PHYTONADIONE 5 MG PO TABS
5.0000 mg | ORAL_TABLET | Freq: Once | ORAL | Status: AC
Start: 2019-01-01 — End: 2019-01-01
  Administered 2019-01-01: 10:00:00 5 mg via ORAL
  Filled 2019-01-01: qty 1

## 2019-01-01 MED ORDER — PREDNISONE 20 MG PO TABS
40.0000 mg | ORAL_TABLET | Freq: Every morning | ORAL | Status: DC
Start: 2019-01-01 — End: 2019-01-02
  Administered 2019-01-01: 14:00:00 40 mg via ORAL
  Filled 2019-01-01 (×3): qty 2

## 2019-01-01 MED ORDER — FAMOTIDINE 10 MG PO TABS
10.0000 mg | ORAL_TABLET | Freq: Every day | ORAL | Status: DC
Start: 2019-01-01 — End: 2019-01-18
  Administered 2019-01-01 – 2019-01-18 (×18): 10 mg via ORAL
  Filled 2019-01-01 (×18): qty 1

## 2019-01-01 NOTE — Respiratory Progress Note (Signed)
Dr. Ladoris Gene was paged about increased HFNC settings

## 2019-01-01 NOTE — Progress Notes (Signed)
Medicine Progress Note   [facility], [place]   Patient Name: Kelli [name] LOS: 1 days   Attending Physician: Eben Burow, MD PCP: Flossie Dibble, MD            Assessment and Plan:    Acute hypoxic respiratory failure likely due to pneumonia/diastolic heart failure  Wean off oxygen as tolerated  Currently on 6 L  Maintain oxygen saturation > 90%    Pneumonia/ Pneumonitis  Continue ceftriaxone, doxycycline  Monitor respiratory status closely  Added low dose steroids    Acute on chronic diastolic heart failure/moderate aortic stenosis  Continue Lasix 20 mg IV twice daily  Strict I's/O, daily weights  Echocardiogram ordered    CKD stage III  Stable  Monitor creatinine while on lasix    Mild anemia due to CKD  Monitor h/H    Paroxysmal atrial fibrillation  Continue amiodarone, metoprolol, warfarin  Pharmacy to dose    Supra therapeutic INR  Hold warfarin  Given a dose of vitamin K  Fall precautions    Hypertension controlled  Continue metoprolol     Hyperlipidemia  Continue statin    D/W RN, RT    Updated patient's grand daughter over phone    Disposition: TBD  DVT PPX:  Anticoagulated  Code:  NO CPR - SUPPORT OK     Subjective   Breathing is better but says she does not feel good.         Objective   Physical Exam:     Vitals: T:(!) 96.1 F (35.6 C) (Oral), BP:126/59, HR:100, RR:20, SaO2:93%    General: Patient is awake. In no acute distress, ill appearing  HEENT: No conjunctival drainage, vision is intact, anicteric sclera.  Neck: Supple, no thyromegaly.  Chest: Diminished breath sounds to auscultation bilaterally  No use of accessory muscles.  CVS: Normal rate and regular rhythm no murmurs, without JVD, no pitting edema, pulses palpable.  Abdomen: Soft, non-tender, no guarding or rigidity, with normal bowel sounds.  Extremities: No calf swelling and no gross deformity.  Skin: Warm, dry, no rash and no worrisome lesions.  NEURO: No motor or sensory  deficits.  Psychiatric: Alert, interactive, appropriate, normal affect.  Weight Monitoring 02/28/2018 03/09/2018 03/16/2018 12/31/2018 12/31/2018 12/31/2018 01/01/2019   Height 152.4 cm 157.5 cm 152.4 cm 152.4 cm - 152.4 cm -   Height Method Stated Stated - Stated - - -   Weight 68.04 kg 68.04 kg 70.126 kg 69.854 kg 66.9 kg - 66.9 kg   Weight Method Stated Stated - Stated Standing Scale - Standing Scale   BMI (calculated) 29.4 kg/m2 27.5 kg/m2 30.3 kg/m2 30.1 kg/m2 - - -         Intake/Output Summary (Last 24 hours) at 01/01/2019 1619  Last data filed at 01/01/2019 1100  Gross per 24 hour   Intake 240 ml   Output 280 ml   Net -40 ml     Body mass index is 28.8 kg/m.     Meds:     Current Facility-Administered Medications   Medication Dose Route Frequency    albuterol  2.5 mg Nebulization Q4H SCH    amiodarone  100 mg Oral Daily    aspirin EC  81 mg Oral QAM    cefTRIAXone  2 g Intravenous Q24H    dorzolamide  1 drop Left Eye TID    doxycycline  100 mg Intravenous Q12H    famotidine  10 mg Oral Daily  folic acid  1 mg Oral Daily    furosemide  20 mg Intravenous BID    lactobacillus species  50 Billion CFU Oral Daily    metoprolol succinate XL  50 mg Oral QAM    potassium chloride  20 mEq Oral QAM    pravastatin  40 mg Oral QAM    predniSONE  40 mg Oral QAM W/BREAKFAST    sodium chloride (PF)  3 mL Intravenous Q8H    warfarin therapy placeholder  1 each Does not apply See Admin Instructions       PRN Meds: acetaminophen **OR** acetaminophen **OR** acetaminophen, albuterol, naloxone, ondansetron **OR** ondansetron, prochlorperazine.     LABS:     Estimated Creatinine Clearance: 21 mL/min (A) (based on SCr of 1.31 mg/dL (H)).  Recent Labs   Lab 01/01/19  0653 12/31/18  1108   WBC 8.6 7.8   RBC 3.18* 3.46*   Hemoglobin 10.3* 11.1*   Hematocrit 31.8* 34.9*   MCV 100 101*   PLT CT 245 259     Recent Labs   Lab 01/01/19  0653 12/31/18  1108   PT 93.0* 85.1*   PT INR 9.7* 8.8*     Recent Labs   Lab 12/31/18  1108    Troponin I 0.01     No results found for: [id number]  Recent Labs   Lab 01/01/19  0653 12/31/18  1607 12/31/18  1108   Glucose 118*  --  98   Sodium 138  --  142   Potassium 3.7  --  4.7   Chloride 102  --  103   CO2 24  --  24   BUN 20  --  20   Creatinine 1.31* 1.30* 1.31*   EGFR 34* 34* 34*   Calcium 8.9  --  9.0     Recent Labs   Lab 01/01/19  0653   Magnesium 2.0   Phosphorus 2.9   Albumin 2.7*   Protein, Total 6.1   Bilirubin, Total 0.7   Alkaline Phosphatase 85   ALT 9   AST (SGOT) 20     Recent Labs   Lab 12/31/18  1310   Specific Gravity, UR 1.012   pH, Urine 7.0   Protein, UR Negative   Glucose, UA Negative   Ketones UA 5   Bilirubin, UA Negative   Blood, UA Moderate*   Nitrite, UA Negative   Urobilinogen, UA Normal   Leukocyte Esterase, UA Negative   WBC, UA 1   RBC, UA 14*   Bacteria, UA Rare*      Patient Lines/Drains/Airways Status    Active PICC Line / CVC Line / PIV Line / Drain / Airway / Intraosseous Line / Epidural Line / ART Line / Line / Wound / Pressure Ulcer / NG/OG Tube     Name:   Placement date:   Placement time:   Site:   Days:    Peripheral IV 01/01/19 Right Forearm   01/01/19    0030    Forearm   less than 1               Xr Chest 2 Views    Result Date: 12/31/2018  Multifocal pneumonia versus edema: Clinical correlation is recommended. ReadingStation:[email or web address]    Ct Chest Wo Contrast    Result Date: 12/31/2018  1. Bilateral airspace disease/pneumonia or nonspecific pneumonitis. Correlate with clinical findings and recommend follow-up to resolution. 2. Superimposed CHF with mild pulmonary edema and small bilateral  pleural effusions. ReadingStation:[email or web address]     Home Health Needs:  There are no questions and answers to display.       Nutrition assessment done in collaboration with Registered Dietitians:     Time spent: 35 minutes     Eben Burow, MD     01/01/19,4:19 PM   MRN: [id number]                                      CSN: [id number] DOB: 1919-04-10

## 2019-01-01 NOTE — Progress Note - Problem Oriented Charting Notewrit (Signed)
Pharmacy Consult Warfarin Dosing  Kelli [name]    Age: 85 y.o.  Weight: 66.9 kg (147 lb 7.8 oz)  Indication:   Goal INR: 2-3  Home warfarin dose =   2.5 mg daily except 5mg  Thurs and Sat  Interacting agent/disease:     Subjective/Objective:   89 yof admitted from assisted living with hypoxia and lethargy. Takes warfarin as outpatient reportedly at above dosage, however it is not on patient's home med list, per facility home med list is not regularly updated    Assessment/Plan:   INR today 9.7.   Hold warfarin today for supra-therapeutic INR. No documented signs/symptoms of bleeding.   Daily INR   Monitor for signs and symptoms of bleeding   Avoid IM injections      Current warfarin dose =(hold for INR greater than 3.5)  Date 2/8 2-9          INR 8.8 9.7          dose Hold hold          Hgb 11.1 10.3          Plt 259 245              Past Medical History:   Diagnosis Date    Abnormal vision     Arthritis     Atrial fibrillation     Congestive heart failure     Glaucoma     Hip fx, right, closed, initial encounter 2010    Hyperlipidemia     Hypertension     Low back pain     Macular degeneration     Nonrheumatic aortic (valve) stenosis 08/11/2017    Shingles         Recent Labs   Lab 01/01/19  0653   Bilirubin, Total 0.7   Bilirubin, Direct 0.3   Protein, Total 6.1   Albumin 2.7*   ALT 9   AST (SGOT) 20             If you have any questions, please contact the pharmacist at [phone].    Carmin Richmond, [facility]

## 2019-01-01 NOTE — Plan of Care (Signed)
NURSE NOTE SUMMARY  [facility] - GI/ENDO/GEN MED   Patient Name: Kelli [name]   Attending Physician: Eben Burow, MD   Today's date:   01/01/2019 LOS: 1 days   Shift Summary:                                                              1191: Assumed care of pt at 0700. Pt is resting in bed, assessment complete.  Lab called with critical INR 9.7, MD notified.  Pt denies pain or needs at this time. Bed alarm on. Call bell in reach. Will monitor.    1700: Pt was placed back on high flow nasal cannula d/t low sats     Provider Notifications:      Rapid Response Notifications:  Mobility:      PMP Activity: Step 6 - Walks in Room (01/01/2019  8:00 AM)     Weight tracking:  Family Dynamic:   Last 3 Weights for the past 72 hrs (Last 3 readings):   Weight   01/01/19 0321 66.9 kg (147 lb 7.8 oz)   12/31/18 1601 66.9 kg (147 lb 7.8 oz)   12/31/18 1055 69.9 kg (154 lb)             Recent Vitals Last Bowel Movement   BP: 126/59 (01/01/2019  2:56 PM)  Heart Rate: 100 (01/01/2019  2:56 PM)  Temp: (!) 96.1 F (35.6 C) (01/01/2019  2:56 PM)  Resp Rate: 20 (01/01/2019  2:56 PM)  Height: 1.524 m (5') (stated) (12/31/2018  4:06 PM)  Weight: 66.9 kg (147 lb 7.8 oz) (01/01/2019  3:21 AM)  SpO2: 93 % (01/01/2019  5:00 PM)   No data recorded           Problem: Moderate/High Fall Risk Score >5  Goal: Patient will remain free of falls  01/01/2019 1802 by Caryl Bis, RN  Outcome: Progressing  Flowsheets (Taken 01/01/2019 0800)  VH Moderate Risk (6-13): Use of "STOP ask for help" sign;Use assistive devices;Remain with patient during toileting;PLACE FALL RISK LEVEL ON [name] BOARD FOR COMMUNICATION PURPOSES IN PATIENT'S ROOM;YELLOW "FALL RISK" ARM BAND;YELLOW NON-SKID SLIPPERS;INITIATE YELLOW "FALL RISK" SIGNAGE;ALL REQUIRED LOW INTERVENTIONS  01/01/2019 1801 by Caryl Bis, RN  Outcome: Progressing  Flowsheets (Taken 01/01/2019 0800)  VH Moderate Risk (6-13): Use of "STOP ask for help" sign;Use assistive devices;Remain with  patient during toileting;PLACE FALL RISK LEVEL ON [name] BOARD FOR COMMUNICATION PURPOSES IN PATIENT'S ROOM;YELLOW "FALL RISK" ARM BAND;YELLOW NON-SKID SLIPPERS;INITIATE YELLOW "FALL RISK" SIGNAGE;ALL REQUIRED LOW INTERVENTIONS     Problem: Compromised Tissue integrity  Goal: Damaged tissue is healing and protected  01/01/2019 1802 by Caryl Bis, RN  Outcome: Progressing  Flowsheets (Taken 01/01/2019 0226 by Clover Mealy, RN)  Damaged tissue is healing and protected : Monitor/assess Braden scale every shift;Reposition patient every 2 hours and as needed unless able to reposition self;Increase activity as tolerated/progressive mobility;Relieve pressure to bony prominences for patients at moderate and high risk;Keep intact skin clean and dry  01/01/2019 1801 by Caryl Bis, RN  Outcome: Progressing  Flowsheets (Taken 01/01/2019 0226 by Clover Mealy, RN)  Damaged tissue is healing and protected : Monitor/assess Braden scale every shift;Reposition patient every 2 hours and as needed unless able to reposition self;Increase activity  as tolerated/progressive mobility;Relieve pressure to bony prominences for patients at moderate and high risk;Keep intact skin clean and dry  Goal: Nutritional status is improving  01/01/2019 1802 by Caryl Bis, RN  Outcome: Progressing  01/01/2019 1801 by Caryl Bis, RN  Outcome: Progressing     Problem: Compromised Hemodynamic Status  Goal: Vital signs and fluid balance maintained/improved  Outcome: Progressing  Flowsheets (Taken 01/01/2019 1802)  Vital signs and fluid balance are maintained/improved: Position patient for maximum circulation/cardiac output; Monitor/assess vitals and hemodynamic parameters with position changes; Monitor intake and output. Notify LIP if urine output is less than 30 mL/hour.; Monitor/assess lab values and report abnormal values     Problem: Inadequate Gas Exchange  Goal: Adequate oxygenation and improved ventilation  Outcome: Progressing  Flowsheets  (Taken 01/01/2019 1802)  Adequate oxygenation and improved ventilation: Assess lung sounds; Monitor SpO2 and treat as needed; Position for maximum ventilatory efficiency; Plan activities to conserve energy: plan rest periods; Increase activity as tolerated/progressive mobility; Consult/collaborate with Respiratory Therapy     Problem: Inadequate Airway Clearance  Goal: Normal respiratory rate/effort achieved/maintained  Outcome: Progressing  Flowsheets (Taken 01/01/2019 1802)  Normal respiratory rate/effort achieved/maintained: Plan activities to conserve energy: plan rest periods

## 2019-01-01 NOTE — Progress Notes (Signed)
Nutrition Therapy  Nutrition Assessment    Patient Information:     Name:Kelli [name]   Age: 85 y.o.   Sex: female     MRN: [id number]    Recommendation:     1. Cardiac diet, 1800 ml FR  2. Chocolate Thrive    Nutrition History:   Unsure weight loss, poor appetite screen    Very pleasant 89 yof adm with sob, increased weakness, hypoxic resp failure 2/2 PNA and diastolic heart failure. Passed SLP swallow eval 2/8, diet advanced. Sitting up in chair. Tolerating oral intake, no c/o n/v though states not much of an appetite. Ate ~50% of lunch today. Loves ice cream, will send Thrive bid.    Nutrition Focus Physical Findings: Normal - overweight    Nutrition Risk Level: Moderate    Nutrition Diagnosis:     Predicted Suboptimal Energy Intake related to acute illness as evidenced by reports of poor appetite     Monitoring:  Evaluation:    PO/EN/PN intake:  Amount of food , General healthful diet and Liquid meal replacement, or supplement intake   Labs:  Electrolyte Profile, Renal Profile and Glucose, casual    GI Profile:  Bowel Function   Nutrition Focused Physical:  Overall appearance and Digestive system       Assessment Data:     Admission Dx:  Acute respiratory failure [J96.00]  Hypoxia [R09.02]  Pulmonary edema with congestive heart failure [I50.1]  Supratherapeutic international normalized ratio (INR) [R79.1]  PMH:  has a past medical history of Abnormal vision, Arthritis, Atrial fibrillation, Congestive heart failure, Glaucoma, Hip fx, right, closed, initial encounter (2010), Hyperlipidemia, Hypertension, Low back pain, Macular degeneration, Nonrheumatic aortic (valve) stenosis (08/11/2017), and Shingles.  PSH:  has a past surgical history that includes Appendectomy; Tonsillectomy; Hip surgery (Bilateral); Eye surgery; and Tubal ligation.     Height: 1.524 m (5')   Weight: 66.9 kg (147 lb 7.8 oz)   BMI: Body mass index is 28.8 kg/m.   IBW: 45 kg  Weight Monitoring Weight Weight Method   11/22/2017 67.858 kg  Standing Scale   02/09/2018 68.04 kg Stated   02/28/2018 68.04 kg Stated   12/31/2018 69.854 kg Stated   12/31/2018 66.9 kg Standing Scale   01/01/2019 66.9 kg Standing Scale     Pertinent Meds: abx, KCl, coumadin, folic acid, lasix, +others   Pertinent Labs:  Recent Labs   Lab 01/01/19  0653 12/31/18  1607 12/31/18  1108   Sodium 138  --  142   Potassium 3.7  --  4.7   Chloride 102  --  103   CO2 24  --  24   BUN 20  --  20   Creatinine 1.31* 1.30* 1.31*   Glucose 118*  --  98   Calcium 8.9  --  9.0   Magnesium 2.0  --   --    Phosphorus 2.9  --   --       Diet Order:  Orders Placed This Encounter   Procedures   . Diet cardiac Fluid restriction: 1800 ML FLUID; Additional restrictions: CARDIAC      GI symptoms:    +bm 2/8  Skin:  No PI noted    Food Security Issues:  No    Learning Needs:  No education needs at this time    Estimated Needs:  Estimated Energy Needs  Total Energy Estimated Needs: 1000-1200 kcal  Method for Estimating Needs: 22-25 kcal/kg ibw (45 kg)  Estimated Protein  Needs  Total Protein Estimated Needs: 54-68 gm  Method for Estimating Needs: 1.2-1.5 gm/kg ibw (45 kg)       Additional Comments:       Arlyce Dice, MA, RD  Contact me via Cortext  Pager [phone]  Office #: [phone]  01/01/19 2:08 PM

## 2019-01-01 NOTE — Progress Notes (Signed)
Patient was placed back on HFNC because of low sats.   HFNC settings are 30L/70%, sats are currently 93%        01/01/19 1700   Oxygen Therapy/Pulse Ox   O2 Status In Use;Humidifier Used   O2 Device HFNC   O2 Flow Rate (L/min) 30 L/min   FiO2 70 %   SpO2 93 %   High Humidity NC   Status: In Use   HH Oxygen Flow (L/min) 30 l/min   Heater Temperature 84.7 F (29.3 C)   Adverse Reactions None   Performing Departments   O2 Device performing department formula [phone]   High humidity N/C performing department formula [phone]   Oxygen performing department formula [phone]

## 2019-01-01 NOTE — SLP Plan of Care Note (Signed)
[facility]  Speech Language Pathology   Daily Progress Note    Patient: Kelli [name]    CSN#: [id number]    ASSESSMENT/PLAN/RECOMMENDATIONS:     SLP diagnosis:  Mild Oropharyngeal Dysphagia    Diet recommendations:  Regular diet, Thin liquids, Meds whole in applesauce/pureed    Projected Discharge Recommendations:  To be determined pending medical course    Prognosis:  Fair d/t and advanced age    x Continue therapy for: F/u x 1 for diet tolerance and strategies training.     Discontinue therapy        Goals:  Pt will:  STG:  (In 3-4 days)  1. Pt will tolerate a REGULAR diet with THIN liquids free of signs/symptoms of aspiration. (Ongoing)  2. Pt will use compensatory swallow strategies of Take small bites/sips, Eat/feed slowly during meals  Independently (ongoing)  3. Pt/family/staff education (Ongoing)    LTG: (By discharge)  1. Pt will tolerate the least restrictive diet free of signs/symptoms of aspiration.  (Ongoing)    SUBJECTIVE:      Subjective: Patient is agreeable to participation in the therapy session. Nursing clears patient for therapy. Patient's medical condition is appropriate for Speech therapy intervention at this time.    S:  " My granddaughter sends me fruit like this."       OBJECTIVE:     O: SLP Treatment:  Pt was seen for SLP tx with progress as follows:    Pt seen sitting upright in chair. Alert/awake and actively participating in tx. Pt on 6LO2 via nc   1) Pt completed straw sips of thin w/ intermittent delayed throat clear, timely swallow reflex, no vocal changes. Pt completed tsp mechanical soft x 7 with prolonged but functional mastication, often expectorating pieces of "tough" fruit, no overt s/s of aspiration. Pt completed tsp x 3 masticated trials free from s/s of aspiration.   2) Pt required re-education of s/s of aspiration and risks of aspiration as well as compensatory swallow strategies as pt takes large bites/sips. Pt able to follow commands when cued.     F/u for diet  tolerance, strategies training.       Pt/Family/Caregiver Education Provided:     Patient was/were educated re: sign/symptoms/risks of aspiration, compensatory swallowing strategies (Position upright 90 degrees for all po intake/meds, Keep patient upright 60 minutes after meals, Alternate liquids and solids, Take small bites/sips, Eat/feed slowly), Progress on goals.  Good understanding was verbalized/demonstrated: yes  Comprehension limited by: None    Team Communication:     Spoke to : RN/LPN - Megan  Regarding: compensatory swallowing strategies (Position upright 90 degrees for all po intake/meds, Keep patient upright 60 minutes after meals, Alternate liquids and solids, Take small bites/sips, Eat/feed slowly), progress on goals  Good understanding was verbalized: yes                          Time of treatment:   SLP Received On: 01/01/19  Start Time: 1532  Stop Time: 1558  Time Calculation (min): 26 min    SLP Visit Number: 2      Completed by:  Luetta Nutting. Zephaniah Enyeart, M.S., CCC-SLP [phone]  Speech Language Pathologist  [facility]

## 2019-01-01 NOTE — Plan of Care (Signed)
NURSE NOTE SUMMARY  [facility] - GI/ENDO/GEN MED   Patient Name: [facility] WHITE   Attending Physician: Eben Burow, MD   Today's date:   01/01/2019 LOS: 1 days   Shift Summary:                                                              1900: Assumed care of pt. Pt resting in bed. Pt wearing 6l NC with continuous pulse ox on. Denies any needs at this time.    2315: Assessment complete. Medications administered per MAR. Pt resting in bed and denies any pain or further needs. Bed alarm is set and call bell within reach. Will continue to monitor.    Provider Notifications:      Rapid Response Notifications:  Mobility:      PMP Activity: Step 6 - Walks in Room (12/31/2018 11:15 PM)     Weight tracking:  Family Dynamic:   Last 3 Weights for the past 72 hrs (Last 3 readings):   Weight   12/31/18 1601 66.9 kg (147 lb 7.8 oz)   12/31/18 1055 69.9 kg (154 lb)             Recent Vitals Last Bowel Movement   BP: 106/46 (12/31/2018 10:29 PM)  Heart Rate: 91 (12/31/2018 10:29 PM)  Temp: 97.9 F (36.6 C) (12/31/2018 10:29 PM)  Resp Rate: 16 (12/31/2018 10:29 PM)  Height: 1.524 m (5') (stated) (12/31/2018  4:06 PM)  Weight: 66.9 kg (147 lb 7.8 oz) (12/31/2018  4:01 PM)  SpO2: 90 % (12/31/2018 11:00 PM)   No data recorded           Problem: Moderate/High Fall Risk Score >5  Goal: Patient will remain free of falls  Outcome: Progressing  Flowsheets (Taken 12/31/2018 2315)  VH Moderate Risk (6-13): ALL REQUIRED LOW INTERVENTIONS;INITIATE YELLOW "FALL RISK" SIGNAGE;YELLOW NON-SKID SLIPPERS;YELLOW "FALL RISK" ARM BAND;USE OF BED EXIT ALARM IF PATIENT IS CONFUSED OR IMPULSIVE. PLACE RESET BED ALARM SIGN ABOVE BED;PLACE FALL RISK LEVEL ON WHITE BOARD FOR COMMUNICATION PURPOSES IN PATIENT'S ROOM;Include family/significant other in multidisciplinary discussion regarding plan of care as appropriate;Remain with patient during toileting     Problem: Compromised Tissue integrity  Goal: Damaged tissue is healing and protected  Outcome:  Progressing  Flowsheets (Taken 01/01/2019 0226)  Damaged tissue is healing and protected : Monitor/assess Braden scale every shift; Reposition patient every 2 hours and as needed unless able to reposition self; Increase activity as tolerated/progressive mobility; Relieve pressure to bony prominences for patients at moderate and high risk; Keep intact skin clean and dry  Goal: Nutritional status is improving  Outcome: Progressing  Flowsheets (Taken 01/01/2019 0226)  Nutritional status is improving: Allow adequate time for meals

## 2019-01-02 ENCOUNTER — Inpatient Hospital Stay: Payer: Medicare Other

## 2019-01-02 DIAGNOSIS — N183 Chronic kidney disease, stage 3 unspecified: Secondary | ICD-10-CM

## 2019-01-02 DIAGNOSIS — E785 Hyperlipidemia, unspecified: Secondary | ICD-10-CM

## 2019-01-02 DIAGNOSIS — I10 Essential (primary) hypertension: Secondary | ICD-10-CM

## 2019-01-02 DIAGNOSIS — J9601 Acute respiratory failure with hypoxia: Secondary | ICD-10-CM | POA: Diagnosis present

## 2019-01-02 LAB — BASIC METABOLIC PANEL
Anion Gap: 14.7 mMol/L (ref 7.0–18.0)
BUN / Creatinine Ratio: 20.9 Ratio (ref 10.0–30.0)
BUN: 28 mg/dL — ABNORMAL HIGH (ref 7–22)
CO2: 24 mMol/L (ref 20–30)
Calcium: 9 mg/dL (ref 8.5–10.5)
Chloride: 101 mMol/L (ref 98–110)
Creatinine: 1.34 mg/dL — ABNORMAL HIGH (ref 0.60–1.20)
EGFR: 33 mL/min/{1.73_m2} — ABNORMAL LOW (ref 60–150)
Glucose: 218 mg/dL — ABNORMAL HIGH (ref 71–99)
Osmolality Calculated: 284 mOsm/kg (ref 275–300)
Potassium: 3.7 mMol/L (ref 3.5–5.3)
Sodium: 136 mMol/L (ref 136–147)

## 2019-01-02 LAB — CBC AND DIFFERENTIAL
Basophils %: 0.2 % (ref 0.0–3.0)
Basophils Absolute: 0 10*3/uL (ref 0.0–0.3)
Eosinophils %: 0.8 % (ref 0.0–7.0)
Eosinophils Absolute: 0.1 10*3/uL (ref 0.0–0.8)
Hematocrit: 29.7 % — ABNORMAL LOW (ref 36.0–48.0)
Hemoglobin: 9.8 gm/dL — ABNORMAL LOW (ref 12.0–16.0)
Lymphocytes Absolute: 0.7 10*3/uL (ref 0.6–5.1)
Lymphocytes: 5.4 % — ABNORMAL LOW (ref 15.0–46.0)
MCH: 33 pg (ref 28–35)
MCHC: 33 gm/dL (ref 32–36)
MCV: 99 fL (ref 80–100)
MPV: 6.9 fL (ref 6.0–10.0)
Monocytes Absolute: 1.1 10*3/uL (ref 0.1–1.7)
Monocytes: 8.8 % (ref 3.0–15.0)
Neutrophils %: 84.7 % — ABNORMAL HIGH (ref 42.0–78.0)
Neutrophils Absolute: 10.5 10*3/uL — ABNORMAL HIGH (ref 1.7–8.6)
PLT CT: 247 10*3/uL (ref 130–440)
RBC: 2.99 10*6/uL — ABNORMAL LOW (ref 3.80–5.00)
RDW: 17.5 % — ABNORMAL HIGH (ref 11.0–14.0)
WBC: 12.4 10*3/uL — ABNORMAL HIGH (ref 4.0–11.0)

## 2019-01-02 LAB — PT/INR
PT INR: 1.5 — ABNORMAL HIGH (ref 0.5–1.3)
PT: 15.5 s — ABNORMAL HIGH (ref 9.5–11.5)

## 2019-01-02 MED ORDER — VH VANCOMYCIN THERAPY PLACEHOLDER
Status: DC
Start: 2019-01-02 — End: 2019-01-04

## 2019-01-02 MED ORDER — METHYLPREDNISOLONE SODIUM SUCC 40 MG IJ SOLR
40.00 mg | Freq: Three times a day (TID) | INTRAMUSCULAR | Status: DC
Start: 2019-01-02 — End: 2019-01-10
  Administered 2019-01-02 – 2019-01-10 (×25): 40 mg via INTRAVENOUS
  Filled 2019-01-02 (×30): qty 1

## 2019-01-02 MED ORDER — WARFARIN SODIUM 5 MG PO TABS
5.00 mg | ORAL_TABLET | Freq: Every day | ORAL | Status: AC
Start: 2019-01-02 — End: 2019-01-02
  Administered 2019-01-02: 18:00:00 5 mg via ORAL
  Filled 2019-01-02: qty 1

## 2019-01-02 MED ORDER — VANCOMYCIN HCL IN DEXTROSE 1-5 GM/200ML-% IV SOLN
1000.00 mg | Freq: Once | INTRAVENOUS | Status: AC
Start: 2019-01-02 — End: 2019-01-03
  Administered 2019-01-02: 13:00:00 1000 mg via INTRAVENOUS
  Filled 2019-01-02: qty 1000

## 2019-01-02 MED ORDER — PIPERACILLIN-TAZOBACTAM IN DEX 2-0.25 GM/50ML IV SOLN
2.25 g | Freq: Three times a day (TID) | INTRAVENOUS | Status: DC
Start: 2019-01-02 — End: 2019-01-10
  Administered 2019-01-02 – 2019-01-10 (×24): 2.25 g via INTRAVENOUS
  Filled 2019-01-02 (×28): qty 50

## 2019-01-02 NOTE — Progress Notes (Signed)
Medicine Progress Note - [facility]  [facility]      Date Time: 01/02/19 11:31 AM  Patient Name: Kelli Brown,Kelli Brown  Attending Physician: Eben Burow, MD    Assessment:                                                                                        Active Problems:    Pneumonia    Acute exacerbation of CHF (congestive heart failure)    Acute respiratory failure with hypoxia    Hypertension    Chronic kidney disease, stage III (moderate)    Hyperlipidemia        Plan:                                                                                                      Acute hypoxic respiratory failurelikelydue to pneumonia/diastolic heart failure  Currently on HFNC 40L/min FiO2 of 75%, wean as tolerated  Maintain oxygen saturation > 90%    Pneumonia/ Pneumonitis  CXR/CT: consistent with multifocal pneumonia/pneumonitis  Patient spiked fever with increasing oxygen demand  Discontinue ceftriaxone and doxy  Started Vanc and Zosyn  Stat portable CXR ordered  Blood cultures and MRSA ordered  Solumederol 40 mg q8h  Monitor respiratory status closely    Acute on chronic diastolic heart failure/moderate aortic stenosis  Continue Lasix 20 mg IV twice daily  Strict I's/O, daily weights  Echo ordered    CKD stage III  Creatinine stable ~1.3  Monitor while on lasix    Mild anemia due to CKD  Monitor h/H    Paroxysmal atrial fibrillation  Continue amiodarone 100mg  and metoprolol 50mg   Pharmacy to dose warfarin    Hypertension controlled  Continue metoprolol 50mg     Hyperlipidemia  Continue statin 40mg     Supra therapeutic INR, resolved  9.7 on admission, now 1.5  S/p vitamin K  Fall precautions    DVT PPx: on therapeutic anticoagulation with coumadin, pharmacy to dose  Lines:                                                                                                       Patient Lines/Drains/Airways Status    Active PICC Line / CVC Line / PIV Line / Drain / Airway /  Intraosseous Line / Epidural Line /  ART Line / Line / Wound / Pressure Ulcer / NG/OG Tube     Name:   Placement date:   Placement time:   Site:   Days:    Peripheral IV 01/01/19 Right Forearm   01/01/19    0030    Forearm   1                Disposition:                                                                                             Today's date: 01/02/2019  Length of Stay: 2  Code Status: NO CPR - SUPPORT OK  Anticipated medical stability for discharge in : 5 days    Subjective                                                                                             CC: <principal problem not specified>     Pt had difficulty sleeping over night with HFNC. She has to cough with deep breaths. She denies chest pain, N/V/D, abdominal pain, or swelling      Review of Systems:                                                                               Review of Systems   Constitutional: Negative for chills and fever.   HENT: Negative for congestion and sore throat.    Respiratory: Positive for cough and shortness of breath.    Cardiovascular: Negative for chest pain and leg swelling.   Gastrointestinal: Negative for abdominal pain, diarrhea, nausea and vomiting.   Genitourinary: Negative for dysuria, frequency and urgency.   Musculoskeletal: Negative for arthralgias and myalgias.   Skin: Negative for pallor and rash.   Neurological: Negative for dizziness and headaches.       Physical Exam:                                                                                     Temp:  [96.1 F (35.6 C)-102 F (38.9 C)] 102 F (38.9 C)  Heart Rate:  [97-110] 105  Resp Rate:  [18-28] 28  BP: (95-136)/(43-67) 136/67  FiO2:  [55 %-100 %] 100 %    Intake/Output Summary (Last 24 hours) at 01/02/2019 1131  Last data filed at 01/01/2019 2322  Gross per 24 hour   Intake 480 ml   Output 400 ml   Net 80 ml       General: Awake, alert, oriented x 3; No acute distress.  HEENT: EOMI, Sclera anicteric  Oropharynx clear  without lesions, mucous membranes moist  Glands: No cervical or axillary lymphadenopathy.  Neck: Supple, no lymphadenopathy, no thyromegaly  Cardiovascular: RRR, no murmurs, rubs or gallops  Lungs: no wheezing but coarse sounds at the bases, no respiratory distress or retractions   Abdomen: Soft, non-tender, non-distended; no palpable masses, normoactive bowel sounds, no rebound or guarding  Extremities: No clubbing, cyanosis, or edema  Neuro: Cranial nerves grossly intact, strength 5/5 in upper and lower extremities, sensation intact  Psych: Normal affect, not depressed   Skin: No rashes or lesions noted    Meds:                                                                                                       Medications were reviewed in the electronic record: [x]       Estimated Creatinine Clearance: 20.5 mL/min (A) (based on SCr of 1.34 mg/dL (H)).  Current Facility-Administered Medications   Medication Dose Route Frequency   . albuterol  2.5 mg Nebulization Q4H SCH   . amiodarone  100 mg Oral Daily   . aspirin EC  81 mg Oral QAM   . cefTRIAXone  2 g Intravenous Q24H   . dorzolamide  1 drop Left Eye TID   . doxycycline  100 mg Intravenous Q12H   . famotidine  10 mg Oral Daily   . folic acid  1 mg Oral Daily   . furosemide  20 mg Intravenous BID   . lactobacillus species  50 Billion CFU Oral Daily   . methylPREDNISolone  40 mg Intravenous Q8H   . metoprolol succinate XL  50 mg Oral QAM   . potassium chloride  20 mEq Oral QAM   . pravastatin  40 mg Oral QAM   . sodium chloride (PF)  3 mL Intravenous Q8H   . warfarin therapy placeholder  1 each Does not apply See Admin Instructions     PRN medications: acetaminophen **OR** acetaminophen **OR** acetaminophen, albuterol, naloxone, ondansetron **OR** ondansetron, prochlorperazine  IV Drips:       Labs and Imaging:                                                                                 Results     Procedure Component Value Units Date/Time    Prothrombin  time/INR [[id number]]  (Abnormal) Collected:  01/02/19 0512    Specimen:  Blood Updated:  01/02/19 0612     PT 15.5 sec      PT INR 1.5    Basic Metabolic Panel [[id number]]  (Abnormal) Collected:  01/02/19 0512    Specimen:  Plasma Updated:  01/02/19 0606     Sodium 136 mMol/L      Potassium 3.7 mMol/L      Chloride 101 mMol/L      CO2 24 mMol/L      Calcium 9.0 mg/dL      Glucose 643 mg/dL      Creatinine 3.29 mg/dL      BUN 28 mg/dL      Anion Gap 51.8 mMol/L      BUN/Creatinine Ratio 20.9 Ratio      EGFR 33 mL/min/1.42m2      Osmolality Calculated 284 mOsm/kg     CBC and differential [[id number]]  (Abnormal) Collected:  01/02/19 0512    Specimen:  Blood Updated:  01/02/19 0601     WBC 12.4 K/cmm      RBC 2.99 M/cmm      Hemoglobin 9.8 gm/dL      Hematocrit 16.0 %      MCV 99 fL      MCH 33 pg      MCHC 33 gm/dL      RDW 10.9 %      PLT CT 247 K/cmm      MPV 6.9 fL      NEUTROPHIL % 84.7 %      Lymphocytes 5.4 %      Monocytes 8.8 %      Eosinophils % 0.8 %      Basophils % 0.2 %      Neutrophils Absolute 10.5 K/cmm      Lymphocytes Absolute 0.7 K/cmm      Monocytes Absolute 1.1 K/cmm      Eosinophils Absolute 0.1 K/cmm      BASO Absolute 0.0 K/cmm     Procalcitonin [[id number]]  (Abnormal) Collected:  01/01/19 1613    Specimen:  Plasma Updated:  01/01/19 1719     PROCALCITONIN 0.25 ng/mL           Microbiology, reviewed and are significant for:  Microbiology Results     Procedure Component Value Units Date/Time    Influenza A / B Rapid Test [[id number]] Collected:  12/31/18 1108    Specimen:  Nasal Wash Updated:  12/31/18 1143     Influenza A Negative     Influenza B Negative     Comment: Method: Jarvis Morgan    The sensitivity for this method is between 90% and 95% for Influenza A and around 90% for Influenza B. The specificity for both Influenza A and B is around 96%. False positive results may occur, especially when the prevalence of Influenza activity is low. This is more likely with Influenza B due to its lower  prevalence. Clinical conditions, including the prevalence of influenza activity, should be considered in the interpretation of results. If clinically indicated, results may be confirmed with PCR testing.  The above 2 analytes were performed by [facility] [phone])  [address] [address]         Narrative:       Influenza A antigen detection tests are unable to distinquish between novel and seasonal influenza A.    A negative result for either Influenza A or B antigen does not  exclude influenza virus infection. Clinical correlation required.    All positive influenza antigen tests (A or B) require placement of patient on droplet precaution isolation.          Imaging, reviewed and are significant for:  Radiology Results (24 Hour)     ** No results found for the last 24 hours. **            Signed:  Darral Dash, MD   SVRP R1, [phone]

## 2019-01-02 NOTE — Plan of Care (Addendum)
NURSE NOTE SUMMARY  [facility] - GI/ENDO/GEN MED   Patient Name: Kelli Brown   Attending Physician: Eben Burow, MD   Today's date:   01/02/2019 LOS: 2 days   Shift Summary:                                                              0700-Assumed care of the patient. Patient resting in bed. Patient alert and oriented x4. Patient 02 SATS DROPPING IN LOW 80S. RT called to come assess patient. HI flow was adjusted. Patient assessment complete. Patient medicated per MAR. Patient educated on call bell. Bed alarm set. Will continue to monitor.     1100-Patient needing increased 02 demands. Patient flushed in face. Nurse checked oral temp. Rectal temp taken 102. Patient tachy. Patient denied SOB. Patient flagged for sepsis. RRT notified and came to bedside. Tylenol administered.  RT at bedside to adjust Hi flow. Patient currently on 90% 55 Liters. Patient in high fowlers position. Will continue to monitor.     1400-Patient's rectal temp 99.5    1800-Patient 02 sat 92-95%. Patient VSS. Bed alarm set. Will continue to monitor.    Provider Notifications:      Rapid Response Notifications:  Mobility:      PMP Activity: Step 6 - Walks in Room (01/02/2019  9:00 AM)     Weight tracking:  Family Dynamic:   Last 3 Weights for the past 72 hrs (Last 3 readings):   Weight   01/02/19 0349 67.1 kg (147 lb 14.4 oz)   01/01/19 0321 66.9 kg (147 lb 7.8 oz)   12/31/18 1601 66.9 kg (147 lb 7.8 oz)             Recent Vitals Last Bowel Movement   BP: 103/42 (01/02/2019  3:04 PM)  Heart Rate: 93 (01/02/2019  4:36 PM)  Temp: 99.5 F (37.5 C) (01/02/2019  3:12 PM)  Resp Rate: 22 (01/02/2019  4:36 PM)  Weight: 67.1 kg (147 lb 14.4 oz) (01/02/2019  3:49 AM)  SpO2: 92 % (01/02/2019  4:37 PM)   No data recorded       Problem: Moderate/High Fall Risk Score >5  Description  Fall Risk Score > 5  Goal: Patient will remain free of falls  Outcome: Progressing     Problem: Compromised Tissue integrity  Goal: Damaged tissue is healing  and protected  Description  Interventions:  1. Monitor/assess Braden scale every shift  2. Provide wound care per wound care algorithm  3. Reposition patient every 2 hours and as needed unless able to reposition self  4. Increase activity as tolerated/progressive mobility  5. Relieve pressure to bony prominences for patients at moderate and high risk  6. Avoid shearing injuries   7. Keep intact skin clean and dry  8. Use bath wipes, not soap and water, for daily bathing   9. Use incontinence wipes for cleaning urine, stool and caustic drainage; Foley care as needed   10. Monitor external devices/tubes for correct placement to prevent pressure, friction and shearing   11. Encourage use of lotion/moisturizer on skin  12. Monitor patient's hygiene practices  13. Consult/collaborate with wound care nurse   14. Utilize specialty bed  15. Consider placing an indwelling catheter if incontinence interferes with healing of stage  3 or 4 pressure injury  Outcome: Progressing     Problem: Compromised Tissue integrity  Goal: Nutritional status is improving  Description  Interventions:  1. Assist patient with eating   2. Allow adequate time for meals   3. Encourage patient to take dietary supplement(s) as ordered   4. Collaborate with Clinical Nutritionist  5. Include patient/patient care companion in decisions related to nutrition  Outcome: Progressing     Problem: Compromised Hemodynamic Status  Goal: Vital signs and fluid balance maintained/improved  Description  Interventions:  1. Position patient for maximum circulation / cardiac output  2. Monitor and assess vitals and hemodynamic parameters with position changes  3. Monitor and compare daily weight  4. Monitor intake and output.  Notify LIP if urine output is less than 30 mL/hour   5. Monitor/assess lab values and report abnormal values  Outcome: Progressing     Problem: Inadequate Gas Exchange  Goal: Adequate oxygenation and improved  ventilation  Description  Interventions  1. Assess lung sounds   2. Monitor SpO2 and treat as needed  3. Monitor and treat ETCO2  4. Provide mechanical and oxygen support to facilitate gas exchange  5. Position for maximum ventilatory efficiency  6. Teach/reinforce use of incentive spirometer 10 times per hour while awake, cough and deep breath as needed  7. Plan activities to conserve energy: plan rest periods  8. Increase activity as tolerated/progressive mobility  9. Consult/collaborate with Respiratory Therapy  Flowsheets (Taken 01/01/2019 1802 by Caryl Bis, RN)  Adequate oxygenation and improved ventilation: Assess lung sounds;Monitor SpO2 and treat as needed;Position for maximum ventilatory efficiency;Plan activities to conserve energy: plan rest periods;Increase activity as tolerated/progressive mobility;Consult/collaborate with Respiratory Therapy     Problem: Inadequate Airway Clearance  Goal: Normal respiratory rate/effort achieved/maintained  Description  Interventions:  1. Plan activities to conserve energy: plan rest periods  Outcome: Progressing

## 2019-01-02 NOTE — Progress Notes (Signed)
01/02/19 0749   Oxygen Therapy/Pulse Ox   O2 Status In Use;Humidifier Used   O2 Device HFNC   O2 Flow Rate (L/min) 40 L/min   FiO2 80 %  (Adjustments made due to desaturation.)   SpO2 90 %   $ Pulse Oximetry Type Performed Multiple (Walking)   High Humidity NC   Status: In Use   HH Oxygen Flow (L/min) 40 l/min   Heater Temperature 87.8 F (31 C)   Adverse Reactions None   Performing Departments   O2 Device performing department formula [id number]   POX check performing department formula [id number]   High humidity N/C performing department formula [id number]   Oxygen performing department formula [id number]

## 2019-01-02 NOTE — Progress Notes (Signed)
01/02/19 0824   Oxygen Therapy/Pulse Ox   O2 Status In Use;Humidifier Used   O2 Device HFNC   O2 Flow Rate (L/min) 40 L/min   FiO2 75 %  (Decreased from 80%)   SpO2 92 %   Performing Departments   O2 Device performing department formula [id number]   Oxygen performing department formula [phone]

## 2019-01-02 NOTE — Progress Notes (Signed)
Pharmacy Vancomycin Dosing Consult Note  Kelli Brown    Assessment:   1. Day 1 vancomycin and Zosyn for sepsis in this 55 yoF with sepsis and PNA. Pt on doxycycline and ceftriaxone since 2/8 but new fever and leukocytosis today so ABX coverage broadened.  2. Febrile with new leukocytosis. SCr elevated. Will dose by levels at this time. May be able to convert to q48hr dosing later this week. Blood cx and MRSA nares pending. CXR 2/8 with bilateral airspace disease/PNA. New CXR pending.     Plan:   1. Vancomycin 1000 mg IV once then dosing by levels.  2. Trough level TBD  3. Zosyn reduced to 2.25 gm IV q8hr  4. MRSA nares pending (MD ordered)  5. Pharmacy will follow the patient's renal function, vancomycin levels, and dosing during the course of therapy. If you have any questions, please contact the pharmacist at [phone].      Indication: sepsis  Goal trough: 15-20    Age: 85 y.o.  Height: 1.524 m (5')  Weight:  67.1 kg (147 lb 14.4 oz)  IBW: 45.5 kg  DW: 54.1 kg       Baseline Population Estimate Kinetics:    SCr: 1.34 mg/dL    CrCl: 20 ml/min    Ke: 0.021 hr-1        t50: 33 hrs        Vd: 37 L        Expected Trough: TBD                Historic Patient Regimen   Date Regimen Weight SCr CrCl Trough Level Adjustments to Dose              Current Patient Regimen   Date Regimen Trough Date/Time Trough Level Pt Specific Ke                                 Cultures   Date Source Organism Sensitivities Resistance                                        qSOFA   Date SBP less than/equal to 100 mm Hg RR greater than/equal to 22 breaths/min GCS less than/equal to 14 Total   2/10  1  1      Recent Labs   Lab 01/02/19  0512 01/01/19  0653 12/31/18  1607 12/31/18  1108   Creatinine 1.34* 1.31* 1.30* 1.31*   BUN 28* 20  --  20   WBC 12.4* 8.6  --  7.8     Temp (24hrs), Avg:98.2 F (36.8 C), Min:96.1 F (35.6 C), Max:102 F (38.9 C)    Vitals:    01/02/19 1139   BP:    Pulse:    Resp:    Temp:    SpO2: 92%

## 2019-01-02 NOTE — Plan of Care (Addendum)
NURSE NOTE SUMMARY  [facility] - GI/ENDO/GEN MED   Patient Name: Kelli Brown   Attending Physician: Eben Burow, MD   Today's date:   01/02/2019 LOS: 2 days   Shift Summary:                                                              1900: Assumed care of pt. Pt resting in bed. Pt A&Ox4. Continuous pulse ox is on. Denies any needs at this time. Will continue to monitor.     0100: Assessment complete. Medications administered per MAR. Pt resting in bed and denies any pain or further needs. Bed alarm is set and call bell within reach. Will continue to monitor.    Provider Notifications:      Rapid Response Notifications:  Mobility:      PMP Activity: Step 6 - Walks in Room (01/01/2019  9:30 PM)     Weight tracking:  Family Dynamic:   Last 3 Weights for the past 72 hrs (Last 3 readings):   Weight   01/02/19 0349 67.1 kg (147 lb 14.4 oz)   01/01/19 0321 66.9 kg (147 lb 7.8 oz)   12/31/18 1601 66.9 kg (147 lb 7.8 oz)             Recent Vitals Last Bowel Movement   BP: 105/50 (01/02/2019  3:49 AM)  Heart Rate: (!) 104 (01/02/2019  3:49 AM)  Temp: 97.2 F (36.2 C) (01/02/2019  3:49 AM)  Resp Rate: 18 (01/02/2019  3:49 AM)  Weight: 67.1 kg (147 lb 14.4 oz) (01/02/2019  3:49 AM)  SpO2: 92 % (01/02/2019  3:49 AM)   No data recorded           Problem: Moderate/High Fall Risk Score >5  Goal: Patient will remain free of falls  Outcome: Progressing  Flowsheets (Taken 01/01/2019 2130)  VH Moderate Risk (6-13): ALL REQUIRED LOW INTERVENTIONS;INITIATE YELLOW "FALL RISK" SIGNAGE;YELLOW NON-SKID SLIPPERS;YELLOW "FALL RISK" ARM BAND;USE OF BED EXIT ALARM IF PATIENT IS CONFUSED OR IMPULSIVE. PLACE RESET BED ALARM SIGN ABOVE BED;PLACE FALL RISK LEVEL ON Brown BOARD FOR COMMUNICATION PURPOSES IN PATIENT'S ROOM;Include family/significant other in multidisciplinary discussion regarding plan of care as appropriate;Remain with patient during toileting     Problem: Compromised Tissue integrity  Goal: Damaged tissue is  healing and protected  Outcome: Progressing  Flowsheets (Taken 01/01/2019 0226)  Damaged tissue is healing and protected : Monitor/assess Braden scale every shift;Reposition patient every 2 hours and as needed unless able to reposition self;Increase activity as tolerated/progressive mobility;Relieve pressure to bony prominences for patients at moderate and high risk;Keep intact skin clean and dry  Goal: Nutritional status is improving  Outcome: Progressing  Flowsheets (Taken 01/01/2019 0226)  Nutritional status is improving: Allow adequate time for meals     Problem: Compromised Hemodynamic Status  Goal: Vital signs and fluid balance maintained/improved  Outcome: Progressing  Flowsheets (Taken 01/01/2019 1802 by Caryl Bis, RN)  Vital signs and fluid balance are maintained/improved: Position patient for maximum circulation/cardiac output;Monitor/assess vitals and hemodynamic parameters with position changes;Monitor intake and output. Notify LIP if urine output is less than 30 mL/hour.;Monitor/assess lab values and report abnormal values     Problem: Inadequate Gas Exchange  Goal: Adequate oxygenation and improved ventilation  Outcome: Progressing  Flowsheets (Taken 01/01/2019 1802 by Caryl Bis, RN)  Adequate oxygenation and improved ventilation: Assess lung sounds;Monitor SpO2 and treat as needed;Position for maximum ventilatory efficiency;Plan activities to conserve energy: plan rest periods;Increase activity as tolerated/progressive mobility;Consult/collaborate with Respiratory Therapy

## 2019-01-02 NOTE — Respiratory Progress Note (Signed)
RESPIRATORY SERVICES CARE PLAN   Patient Name: [facility] WHITE   Attending Physician: Eben Burow, MD   Today's date:    01/02/2019 LOS: 2 days   CURRENT THERAPY  WEANING ATTEMPT   HFNC 50L 80%    Yes, titrated from 50L 90% to current settings.   OXYGEN THERAPY INCREASED REQUREMENTS THIS SHIFT   SpO2: 92 % (01/02/2019  4:37 PM)  O2 Device: HFNC (01/02/2019  4:37 PM)  FiO2: 80 % (01/02/2019  4:37 PM)  O2 Flow Rate (L/min): 50 L/min (01/02/2019  4:37 PM)     Patient had increased oxygen requirements in the AM but has since recovered and allowed for titration of HFNC.      LAST ABG AIRWAY                     RECOMMENDATIONS/COMMENTS:                                                               RT will continue to monitor, support, and care for patient. HFNC weaned per protocol, Rapid Response aware of patient.

## 2019-01-02 NOTE — SLP Progress Note (Signed)
[facility]  Speech Language Pathology   Daily Progress Note    Patient: Noah Pelaez Lazard    CSN#: [id number]    ASSESSMENT/PLAN/RECOMMENDATIONS:     SLP diagnosis:  Mild Oropharyngeal Dysphagia c/b impaired respiratory status    Diet recommendations:  NPO except ice chips and meds crushed in applesauce in conjunction with aggressive oral care/moist swabs UNTIL RESPIRATORY STATUS IMPROVES    Projected Discharge Recommendations:  No further SLP needs anticipated    Prognosis:  Good     Continue therapy for: Diet tolerance assessment x1-2    Discontinue therapy        Goals:  Pt will:  STG:  (In 3-4 days)  1. Pt will tolerate a REGULAR diet with THIN liquids free of signs/symptoms of aspiration. (ONGOING)  2. Pt will use compensatory swallow strategies of Take small bites/sips, Eat/feed slowly during meals  Independently (ONGOING)  3. Pt/family/staff education (ONGOING)    LTG: (By discharge)  1. Pt will tolerate the least restrictive diet free of signs/symptoms of aspiration.  (ONGOING)    SUBJECTIVE:      Subjective: Patient is agreeable to participation in the therapy session. Nursing clears patient for therapy. Patient's medical condition is appropriate for Speech therapy intervention at this time.    S:  "I understand! I am getting a little winded."      OBJECTIVE:     O: SLP Treatment:  Pt was seen for SLP tx with progress as follows:      The pt was seen today for diet tolerance assessment. On arrival, the pt is now on 55L HFNC. She is seen with her lunch tray. No issues swallowing reported by nursing or pt. Observed pt consume trials of regular texture solid from lunch tray with no signs/symptoms of aspiration, mildly increased SOB. Observed pt consume straw sips of thin liquid x8 free of signs/symptoms of aspiration. Dysphagia or aspiration is not suspected at this time. However, as a precaution given pt's impaired respiratory status, recommend NPO at this time until respiratory status improves. Will  keep pt on for diet tolerance assessment tomorrow with improved respiratory status.      Pt/Family/Caregiver Education Provided:     Patient was/were educated re: role of slp, purpose of evaluation, sign/symptoms/risks of aspiration, results/recommendations of today's evaluation, tx goals  Good understanding was verbalized/demonstrated: yes  Comprehension limited by: None    Team Communication:     Spoke to : RN/LPN Dow Adolph; Physician - Dr. Ladoris Gene  Regarding: role of slp, purpose of evaluation, sign/symptoms/risks of aspiration, results/recommendations of today's evaluation, tx goals  Good understanding was verbalized: yes                          Time of treatment:   SLP Received On: 01/02/19  Start Time: 1315  Stop Time: 1325  Time Calculation (min): 10 min    SLP Visit Number: 3      Completed by:  Herminio Heads, M.S., CCC-SLP  Speech-Language Pathologist  [facility], [facility] via [place], [place] Phone [phone], Pager [phone]

## 2019-01-02 NOTE — Respiratory Progress Note (Signed)
RT called Rapid Response due to increasing respiratory support. Patient desaturated and became tachypneic. HFNC was adjusted to 50L, FiO2 90%. SpO2 is currently 91%. RN is also at bedside. RT will continue to monitor patient.

## 2019-01-02 NOTE — Progress Note - Problem Oriented Charting Notewrit (Signed)
Pharmacy Consult Warfarin Dosing  Ferol Luz Mccluney    Age: 85 y.o.  Weight: 67.1 kg (147 lb 14.4 oz)  Indication: a fib  Goal INR: 2-3  Home warfarin dose =   2.5 mg daily except 5mg  Thurs and Sat  Interacting agent/disease: amiodarone, aspirin, zosyn    Subjective/Objective:   89 yof admitted from assisted living with hypoxia and lethargy. Takes warfarin as outpatient reportedly at above dosage, however it is not on patient's home med list, per facility home med list is not regularly updated    Assessment/Plan:   Vitamin K 5 mg po on 2/9   INR today 1.5.   Give warfarin 5 mg today.   Daily INR   Monitor for signs and symptoms of bleeding   Avoid IM injections      Current warfarin dose =(hold for INR greater than 3.5)  Date 2/8 2/9 2/10         INR 8.8 9.7 1.5         dose Hold hold 5 mg         Hgb 11.1 10.3 9.8         Plt 259 245 247             Past Medical History:   Diagnosis Date    Abnormal vision     Arthritis     Atrial fibrillation     Congestive heart failure     Glaucoma     Hip fx, right, closed, initial encounter 2010    Hyperlipidemia     Hypertension     Low back pain     Macular degeneration     Nonrheumatic aortic (valve) stenosis 08/11/2017    Shingles         Recent Labs   Lab 01/01/19  0653   Bilirubin, Total 0.7   Bilirubin, Direct 0.3   Protein, Total 6.1   Albumin 2.7*   ALT 9   AST (SGOT) 20             If you have any questions, please contact the pharmacist at [phone].    Zigmund Gottron, PharmD

## 2019-01-02 NOTE — Respiratory Progress Note (Signed)
RESPIRATORY SERVICES CARE PLAN   Patient Name: [name]   Attending Physician: Eben Burow, MD   Today's date:    01/02/2019 LOS: 2 days   CURRENT THERAPY  WEANING ATTEMPT    High Flow    Patient is actively being weaned, progressing, and tolerating well this shift.   OXYGEN THERAPY INCREASED REQUREMENTS THIS SHIFT   SpO2: 92 % (01/02/2019  2:00 AM)  O2 Device: HFNC (01/02/2019  2:00 AM)  FiO2: (S) 60 % (01/02/2019  2:00 AM)  O2 Flow Rate (L/min): 40 L/min (01/02/2019  2:00 AM)                LAST ABG AIRWAY                     RECOMMENDATIONS/COMMENTS:                                                               Increased flow to 40L at the beginning of shift in order to achieve saturations where FIO2 can be weaned. Pt was weaned to 55% for most of the night from 70%.  Increased to 60% post patient getting up to use bedside potty chair

## 2019-01-02 NOTE — Progress Notes (Signed)
1128  Called to eval pt with flagging for sepsis screening, pt had sepsis panel completed yesterday except for [facility] which were just ordered by [facility]. Pt is awake, alert and oriented, not in distress. Face flushed, skin warm and dry hiflow in place with sats 96%. LS with rales/ronchi throughout.  RN giving tylenol currently, MD changing antbx. RRT will follow today.    Johnnette Gourd

## 2019-01-02 NOTE — Plan of Care (Signed)
11:31 AM  Primary RN called to assist with patient requiring increased O2 demands and rectal temp 102.  Patient tachypneic, flushed, and conversing with staff.  Denies SOB at rest.  States she get SOB with exertion.  O2 sats 83% on 75% at 40L HFNC.  RT notified.  Patient increased to 100% 40 L HFNC.  Patient sitting high-fowler's.  O2 sats stable around 93-94%.  Primary RN notified rapid response for elevated MEWS and BPA sepsis score 4.      11:33 AM  Primary RN on the phone with Dr. Ladoris Gene and updated on patient's status.  MD to come to bedside.

## 2019-01-02 NOTE — UM Notes (Addendum)
[facility] Utilization Management Review Sheet    Facility :  [facility]    NAME: Kelli Brown  MR#: [id number]  DOB: [birth year]  CSN#: [id number]  ROOM: 528/528-A AGE: 85 y.o.    ADMIT DATE AND TIME: 12/31/2018 10:50 AM  PATIENT CLASS: INPATIENT 12/31/18 1429    ATTENDING PHYSICIAN: Eben Burow, MD  PAYOR:Payor: MEDICARE / Plan: MEDICARE PART A AND B / Product Type: Medicare /     MCG: M-190, M-282   DIAGNOSIS:     ICD-10-CM    1. Hypoxia R09.02    2. Pulmonary edema with congestive heart failure I50.1    3. Supratherapeutic international normalized ratio (INR) R79.1      HISTORY:   Past Medical History:   Diagnosis Date    Abnormal vision     Arthritis     Atrial fibrillation     Congestive heart failure     Glaucoma     Hip fx, right, closed, initial encounter 2010    Hyperlipidemia     Hypertension     Low back pain     Macular degeneration     Nonrheumatic aortic (valve) stenosis 08/11/2017    Shingles      DATE OF REVIEW: 12/31/2018    MEDICINE H&P PER DR. Ladoris Gene:   CC: Shortness of breath  Kelli Brown is a 85 y.o. female patient admitted for the complaints of worsening shortness of breath for about a week associated with generalized weakness.  Patient denies any leg swelling, chest pain, dizziness, palpitations, nausea, vomiting, fever, chills, diarrhea or constipation.  She denies any changes in urinary output.  She is from [facility] assisted living facility.  His walker  Assessment and Plan:                                                              Acute hypoxic respiratory failure likely due to pneumonia/diastolic heart failure  Wean off oxygen as tolerated    Pneumonia  Continue ceftriaxone, doxycycline  Monitor respiratory status closely    Acute on chronic diastolic heart failure/moderate aortic stenosis  Start Lasix 20 mg IV twice daily  Strict I's/O, daily weights  Will order Echocardiogram    Paroxysmal atrial fibrillation  Continue amiodarone, metoprolol,  warfarin  Pharmacy to dose    Supra therapeutic INR  Hold warfarin    Hypertension controlled  Continue metoprolol     Hyperlipidemia  Continue statin    DVT PPx: Anticoagulated   Dispo: Inpatient     Vitals: T:98.1 F (36.7 C) (Oral),  BP:139/48, HR:83, RR:20, SaO2:80%    LABS: RBC 3.46; HGB 11.1/HCT 34.9; CREAT 1.31; ANION GAP 19.7; EGFR 34; BNP 192.4; PT 85.1/INR 8.8; ABG: PO2 54, PCO2 34.4, O2 SAT 89% ON 3L NC; URINE: BLOOD MODERATE, RBC 14, BACTERIA RARE    Xr Chest 2 Views  Result Date: 12/31/2018  Multifocal pneumonia versus edema: Clinical correlation is recommended.     CT CHEST: IMPRESSION:   1. Bilateral airspace disease/pneumonia or nonspecific pneumonitis. Correlate with clinical findings and recommend follow-up to resolution.  2. Superimposed CHF with mild pulmonary edema and small bilateral pleural effusions.    Meds given in the ED:  Medications   furosemide (LASIX) injection 40 mg (40 mg Intravenous Given 12/31/18  1249)     Scheduled Meds:  Current Facility-Administered Medications   Medication Dose Route Frequency    albuterol  2.5 mg Nebulization Q4H SCH    amiodarone  100 mg Oral Daily    aspirin EC  81 mg Oral QAM    dorzolamide  1 drop Left Eye TID    famotidine  10 mg Oral Daily    folic acid  1 mg Oral Daily    furosemide  20 mg Intravenous BID    lactobacillus species  50 Billion CFU Oral Daily    methylPREDNISolone  40 mg Intravenous Q8H    metoprolol succinate XL  50 mg Oral QAM    piperacillin-tazobactam  2.25 g Intravenous Q8H    potassium chloride  20 mEq Oral QAM    pravastatin  40 mg Oral QAM    sodium chloride (PF)  3 mL Intravenous Q8H    vancomycin  1,000 mg Intravenous Once    vancomycin therapy placeholder   Does not apply See Admin Instructions    warfarin  5 mg Oral Daily at 1800    warfarin therapy placeholder  1 each Does not apply See Admin Instructions     Trevor Iha, RN, BSN  Utilization Management  [facility]  [address]  [place], [place] [address]  Work: [phone]  Fax: [phone]  [email or web address]

## 2019-01-03 LAB — CBC AND DIFFERENTIAL
Bands: 4 % (ref 0–10)
Basophils %: 0 % (ref 0.0–3.0)
Basophils Absolute: 0 10*3/uL (ref 0.0–0.3)
Eosinophils %: 0 % (ref 0.0–7.0)
Eosinophils Absolute: 0 10*3/uL (ref 0.0–0.8)
Hematocrit: 32.7 % — ABNORMAL LOW (ref 36.0–48.0)
Hemoglobin: 10.3 gm/dL — ABNORMAL LOW (ref 12.0–16.0)
Lymphocytes Absolute: 0.6 10*3/uL (ref 0.6–5.1)
Lymphocytes: 4 % — ABNORMAL LOW (ref 15.0–46.0)
MCH: 31 pg (ref 28–35)
MCHC: 31 gm/dL — ABNORMAL LOW (ref 32–36)
MCV: 100 fL (ref 80–100)
MPV: 7.2 fL (ref 6.0–10.0)
Monocytes Absolute: 0 10*3/uL — ABNORMAL LOW (ref 0.1–1.7)
Monocytes: 3 % (ref 3.0–15.0)
Neutrophils %: 89 % — ABNORMAL HIGH (ref 42.0–78.0)
Neutrophils Absolute: 13.3 10*3/uL — ABNORMAL HIGH (ref 1.7–8.6)
PLT CT: 286 10*3/uL (ref 130–440)
RBC: 3.27 10*6/uL — ABNORMAL LOW (ref 3.80–5.00)
RDW: 17.4 % — ABNORMAL HIGH (ref 11.0–14.0)
WBC: 13.9 10*3/uL — ABNORMAL HIGH (ref 4.0–11.0)

## 2019-01-03 LAB — I-STAT G3 ARTERIAL CARTRIDGE
BE, ISTAT: 7 mMol/L
HCO3, ISTAT: 30.4 mMol/L — ABNORMAL HIGH (ref 20.0–29.0)
O2 Sat, %, ISTAT: 92 % — ABNORMAL LOW (ref 96–100)
PCO2, ISTAT: 40 mm Hg (ref 35.0–45.0)
PO2, ISTAT: 59 mm Hg — ABNORMAL LOW (ref 75–100)
Room Number I-Stat: 528
TCO2 I-Stat: 32 mMol/L — ABNORMAL HIGH (ref 24–29)
i-STAT FIO2: 80 %
i-STAT Liters Per Minute: 50 L/min
pH, ISTAT: 7.49 — ABNORMAL HIGH (ref 7.35–7.45)

## 2019-01-03 LAB — BASIC METABOLIC PANEL
Anion Gap: 17 mMol/L (ref 7.0–18.0)
BUN / Creatinine Ratio: 23.5 Ratio (ref 10.0–30.0)
BUN: 31 mg/dL — ABNORMAL HIGH (ref 7–22)
CO2: 24 mMol/L (ref 20–30)
Calcium: 9.2 mg/dL (ref 8.5–10.5)
Chloride: 102 mMol/L (ref 98–110)
Creatinine: 1.32 mg/dL — ABNORMAL HIGH (ref 0.60–1.20)
EGFR: 34 mL/min/{1.73_m2} — ABNORMAL LOW (ref 60–150)
Glucose: 227 mg/dL — ABNORMAL HIGH (ref 71–99)
Osmolality Calculated: 291 mOsm/kg (ref 275–300)
Potassium: 4 mMol/L (ref 3.5–5.3)
Sodium: 139 mMol/L (ref 136–147)

## 2019-01-03 LAB — PT/INR
PT INR: 1.4 — ABNORMAL HIGH (ref 0.5–1.3)
PT: 14 s — ABNORMAL HIGH (ref 9.5–11.5)

## 2019-01-03 MED ORDER — VANCOMYCIN HCL 500 MG IV SOLR
500.00 mg | Freq: Once | INTRAVENOUS | Status: AC
Start: 2019-01-03 — End: 2019-01-03
  Administered 2019-01-03: 16:00:00 500 mg via INTRAVENOUS
  Filled 2019-01-03: qty 500

## 2019-01-03 MED ORDER — WARFARIN SODIUM 5 MG PO TABS
5.00 mg | ORAL_TABLET | Freq: Every day | ORAL | Status: AC
Start: 2019-01-03 — End: 2019-01-03
  Administered 2019-01-03: 19:00:00 5 mg via ORAL
  Filled 2019-01-03: qty 1

## 2019-01-03 NOTE — Plan of Care (Addendum)
NURSE NOTE SUMMARY  [facility] - GI/ENDO/GEN MED   Patient Name: Kelli Brown   Attending Physician: Eben Burow, MD   Today's date:   01/03/2019 LOS: 3 days   Shift Summary:                                                              1610- assessed patient, no complaints of pain but noted high level of anxiety. Oriented patient that she was in the hospital and attempted to calm patient. Assisted pt to take her pills in applesauce and discussed the plan for the day including why she could not eat anything yet. No other needs at this time.     Provider Notifications:      Rapid Response Notifications:  Mobility:      PMP Activity: Step 4 - Dangle at Bedside (01/03/2019  7:55 AM)     Weight tracking:  Family Dynamic:   Last 3 Weights for the past 72 hrs (Last 3 readings):   Weight   01/02/19 0349 67.1 kg (147 lb 14.4 oz)   01/01/19 0321 66.9 kg (147 lb 7.8 oz)   12/31/18 1601 66.9 kg (147 lb 7.8 oz)             Recent Vitals Last Bowel Movement   BP: 131/64 (01/03/2019  7:40 AM)  Heart Rate: (!) 107 (01/03/2019  7:40 AM)  Temp: 97.5 F (36.4 C) (01/03/2019  7:40 AM)  Resp Rate: 18 (01/03/2019  7:40 AM)  Weight: 67.1 kg (147 lb 14.4 oz) (01/02/2019  3:49 AM)  SpO2: 90 % (01/03/2019 11:34 AM)   No data recorded       Problem: Moderate/High Fall Risk Score >5  Goal: Patient will remain free of falls  Outcome: Progressing  Flowsheets (Taken 01/03/2019 0755)  VH High Risk (Greater than 13): ALL REQUIRED LOW INTERVENTIONS;ALL REQUIRED MODERATE INTERVENTIONS;RED "HIGH FALL RISK" SIGNAGE;BED ALARM WILL BE ACTIVATED WHEN THE PATEINT IS IN BED WITH SIGNAGE "RESET BED ALARM";A CHAIR PAD ALARM WILL BE USED WHEN PATIENT IS UP SITTING IN A CHAIR;PATIENT IS TO BE SUPERVISED FOR ALL TOILETING ACTIVITIES     Problem: Compromised Tissue integrity  Goal: Damaged tissue is healing and protected  Outcome: Progressing  Flowsheets (Taken 01/01/2019 0226 by Clover Mealy, RN)  Damaged tissue is healing and protected :  Monitor/assess Braden scale every shift;Reposition patient every 2 hours and as needed unless able to reposition self;Increase activity as tolerated/progressive mobility;Relieve pressure to bony prominences for patients at moderate and high risk;Keep intact skin clean and dry  Goal: Nutritional status is improving  Outcome: Progressing  Flowsheets (Taken 01/01/2019 0226 by Clover Mealy, RN)  Nutritional status is improving: Allow adequate time for meals     Problem: Compromised Hemodynamic Status  Goal: Vital signs and fluid balance maintained/improved  Outcome: Progressing  Flowsheets (Taken 01/01/2019 1802 by Caryl Bis, RN)  Vital signs and fluid balance are maintained/improved: Position patient for maximum circulation/cardiac output;Monitor/assess vitals and hemodynamic parameters with position changes;Monitor intake and output. Notify LIP if urine output is less than 30 mL/hour.;Monitor/assess lab values and report abnormal values     Problem: Inadequate Gas Exchange  Goal: Adequate oxygenation and improved ventilation  Outcome: Progressing  Flowsheets (Taken 01/01/2019 1802 by Caryl Bis,  RN)  Adequate oxygenation and improved ventilation: Assess lung sounds;Monitor SpO2 and treat as needed;Position for maximum ventilatory efficiency;Plan activities to conserve energy: plan rest periods;Increase activity as tolerated/progressive mobility;Consult/collaborate with Respiratory Therapy     Problem: Inadequate Airway Clearance  Goal: Normal respiratory rate/effort achieved/maintained  Outcome: Progressing  Flowsheets (Taken 01/01/2019 1802 by Caryl Bis, RN)  Normal respiratory rate/effort achieved/maintained: Plan activities to conserve energy: plan rest periods

## 2019-01-03 NOTE — Respiratory Progress Note (Signed)
RESPIRATORY SERVICES CARE PLAN   Patient Name: [facility]   Attending Physician: Eben Burow, MD   Today's date:    01/03/2019 LOS: 3 days   CURRENT THERAPY  WEANING ATTEMPT    High Flow    Patient is not tolerating weaning attempts this shift.  See reasoning below.   OXYGEN THERAPY INCREASED REQUREMENTS THIS SHIFT   SpO2: 95 % (01/03/2019  2:01 AM)  O2 Device: HFNC (01/03/2019  2:01 AM)  FiO2: 90 % (post getting up to use bedside potty) (01/03/2019  2:01 AM)  O2 Flow Rate (L/min): 50 L/min (01/03/2019  2:01 AM)       Patient has had an increase in FiO2 this shift.         LAST ABG AIRWAY                     RECOMMENDATIONS/COMMENTS:                                                                  unable to wean patient's HFNC throughout the night.  Pt desaturates to low 80's when up using bedside potty. FIO2 increased to 90% after using potty chair this am.

## 2019-01-03 NOTE — Progress Notes (Signed)
Medicine Progress Note - [facility]      Date Time: 01/03/19 3:02 PM  Patient Name: Kelli Brown  Attending Physician: Eben Burow, MD    Assessment:                                                                                        Active Problems:    Pneumonia    Acute exacerbation of CHF (congestive heart failure)    Acute respiratory failure with hypoxia    Hypertension    Chronic kidney disease, stage III (moderate)    Hyperlipidemia        Plan:                                                                                                      Acute hypoxic respiratory failurelikelydue to pneumonia/diastolic heart failure  Currently on HFNC 50L/min FiO2 of 80%, wean as tolerated  Maintain oxygen saturation >90%    Pneumonia/ Pneumonitis  CXR/CT: consistent with multifocal pneumonia/pneumonitis  Patient spiked fever with increasing oxygen demand  Discontinue ceftriaxone and doxy  Started Vanc and Zosyn - pharmacy to dose vanc  Repeat CXR consistent with previous study without acute change  Blood cultures and MRSA nares pending  Solumederol 40 mg q8h    Acute on chronic diastolic heart failure/moderate aortic stenosis  ContinueLasix 20 mg IV twice daily  Strict I's/O, daily weights  Echo: LVH, stable aortic stenosis, moderate tricuspid regurg, moderate to severe pulmonary artery pressure    CKD stage III  Creatinine stable ~1.3  Monitor while on lasix    Mild anemia due to CKD  Monitor h/H    Paroxysmal atrial fibrillation  Continue amiodarone 100mg  and metoprolol 50mg   Pharmacy to dose warfarin    Hypertension controlled  Continue metoprolol 50mg     Hyperlipidemia  Continue statin 40mg     Supra therapeutic INR, resolved  9.7 on admission, now 1.5  S/p vitamin K  Fall precautions    DVT PPx: warfarin  Lines:                                                                                                       Patient Lines/Drains/Airways  Status    Active PICC Line / CVC Line / PIV Line /  Drain / Airway / Intraosseous Line / Epidural Line / ART Line / Line / Wound / Pressure Ulcer / NG/OG Tube     Name:   Placement date:   Placement time:   Site:   Days:    Peripheral IV 01/01/19 Right Forearm   01/01/19    0030    Forearm   2                Disposition:                                                                                             Today's date: 01/03/2019  Length of Stay: 3  Code Status: NO CPR - SUPPORT OK  Anticipated medical stability for discharge in : 4 days    Subjective                                                                                             CC: <principal problem not specified>     Patient is more confused today - oriented to person and time but not place. She appears to be breathing more comfortably. Had long discussion with grand-daughter Delice Bison to update her on patients status. Patient denies chest pain, nausea, vomiting, or diarrhea.       Review of Systems:                                                                               Review of Systems   Constitutional: Negative for chills and fever.   HENT: Negative for congestion and sore throat.    Respiratory: Positive for cough and shortness of breath.    Cardiovascular: Negative for chest pain and leg swelling.   Gastrointestinal: Negative for abdominal pain, diarrhea, nausea and vomiting.   Genitourinary: Negative for dysuria, frequency and urgency.   Musculoskeletal: Negative for arthralgias and myalgias.   Skin: Negative for pallor and rash.   Neurological: Negative for dizziness and headaches.       Physical Exam:  Temp:  [97 F (36.1 C)-99.5 F (37.5 C)] 97.5 F (36.4 C)  Heart Rate:  [89-111] 107  Resp Rate:  [18-24] 18  BP: (99-131)/(42-64) 131/64  FiO2:  [80 %-90 %] 80 %    Intake/Output Summary (Last 24 hours) at 01/03/2019 1502  Last data filed at 01/03/2019  1438  Gross per 24 hour   Intake -   Output 500 ml   Net -500 ml       General: Awake, alert, oriented to person and time, does not know she is in [facility], mildly aggitated  HEENT: EOMI, Sclera anicteric  Oropharynx clear without lesions, mucous membranes moist  Glands: No cervical or axillary lymphadenopathy.  Neck: Supple, no lymphadenopathy, no thyromegaly  Cardiovascular: RRR, no murmurs, rubs or gallops  Lungs: diffusely coarse breath sounds with decreased air movement at the right base  Abdomen: Soft, non-tender, non-distended; no palpable masses, normoactive bowel sounds, no rebound or guarding  Extremities: No clubbing, cyanosis, or edema  Neuro: Cranial nerves grossly intact, strength 5/5 in upper and lower extremities, sensation intact  Psych: Normal affect, not depressed   Skin: No rashes or lesions noted    Meds:                                                                                                       Medications were reviewed in the electronic record: [x]       Estimated Creatinine Clearance: 20.8 mL/min (A) (based on SCr of 1.32 mg/dL (H)).  Current Facility-Administered Medications   Medication Dose Route Frequency   . albuterol  2.5 mg Nebulization Q4H SCH   . amiodarone  100 mg Oral Daily   . aspirin EC  81 mg Oral QAM   . dorzolamide  1 drop Left Eye TID   . famotidine  10 mg Oral Daily   . folic acid  1 mg Oral Daily   . furosemide  20 mg Intravenous BID   . lactobacillus species  50 Billion CFU Oral Daily   . methylPREDNISolone  40 mg Intravenous Q8H   . metoprolol succinate XL  50 mg Oral QAM   . piperacillin-tazobactam  2.25 g Intravenous Q8H   . potassium chloride  20 mEq Oral QAM   . pravastatin  40 mg Oral QAM   . sodium chloride (PF)  3 mL Intravenous Q8H   . vancomycin  500 mg Intravenous Once   . vancomycin therapy placeholder   Does not apply See Admin Instructions   . warfarin  5 mg Oral Daily at 1800   . warfarin therapy placeholder  1 each Does not apply See Admin Instructions      PRN medications: acetaminophen **OR** acetaminophen **OR** acetaminophen, albuterol, naloxone, ondansetron **OR** ondansetron, prochlorperazine  IV Drips:       Labs and Imaging:  Results     Procedure Component Value Units Date/Time    Blood Culture [[id number]] Collected:  01/02/19 1300    Specimen:  Blood from Venipuncture Updated:  01/03/19 1141    Narrative:       Specimen/Source: Blood/Venipuncture  Collected: 01/02/2019 13:00     Status: Valued      Last Updated: 01/03/2019 11:39                Culture Result (Prelim)      No Growth To Date          Blood Culture [[id number]] Collected:  01/02/19 1300    Specimen:  Blood from Venipuncture Updated:  01/03/19 1141    Narrative:       Specimen/Source: Blood/Venipuncture  Collected: 01/02/2019 13:00     Status: Valued      Last Updated: 01/03/2019 11:39                Culture Result (Prelim)      No Growth To Date          CBC and differential [[id number]]  (Abnormal) Collected:  01/03/19 0443    Specimen:  Blood Updated:  01/03/19 0624     WBC 13.9 K/cmm      RBC 3.27 M/cmm      Hemoglobin 10.3 gm/dL      Hematocrit 96.2 %      MCV 100 fL      MCH 31 pg      MCHC 31 gm/dL      RDW 95.2 %      PLT CT 286 K/cmm      MPV 7.2 fL      NEUTROPHIL % 89.0 %      Lymphocytes 4.0 %      Monocytes 3.0 %      Eosinophils % 0.0 %      Basophils % 0.0 %      Bands 4 %      Neutrophils Absolute 13.3 K/cmm      Lymphocytes Absolute 0.6 K/cmm      Monocytes Absolute 0.0 K/cmm      Eosinophils Absolute 0.0 K/cmm      BASO Absolute 0.0 K/cmm      RBC Morphology RBC Morphology Reviewed     Macrocytic 1+     Anisocytosis 1+     Polychromasia 1+    Narrative:       Manual differential performed    Basic Metabolic Panel [[id number]]  (Abnormal) Collected:  01/03/19 0443    Specimen:  Plasma Updated:  01/03/19 0530     Sodium 139 mMol/L      Potassium 4.0 mMol/L      Chloride 102 mMol/L      CO2 24 mMol/L       Calcium 9.2 mg/dL      Glucose 027 mg/dL      Creatinine 2.53 mg/dL      BUN 31 mg/dL      Anion Gap 66.4 mMol/L      BUN/Creatinine Ratio 23.5 Ratio      EGFR 34 mL/min/1.61m2      Osmolality Calculated 291 mOsm/kg     Prothrombin time/INR [[id number]]  (Abnormal) Collected:  01/03/19 0443    Specimen:  Blood Updated:  01/03/19 0526     PT 14.0 sec      PT INR 1.4          Microbiology, reviewed and are significant for:  Microbiology Results  Procedure Component Value Units Date/Time    Blood Culture [[id number]] Collected:  01/02/19 1300    Specimen:  Blood from Venipuncture Updated:  01/03/19 1141    Narrative:       Specimen/Source: Blood/Venipuncture  Collected: 01/02/2019 13:00     Status: Valued      Last Updated: 01/03/2019 11:39                Culture Result (Prelim)      No Growth To Date          Blood Culture [[id number]] Collected:  01/02/19 1300    Specimen:  Blood from Venipuncture Updated:  01/03/19 1141    Narrative:       Specimen/Source: Blood/Venipuncture  Collected: 01/02/2019 13:00     Status: Valued      Last Updated: 01/03/2019 11:39                Culture Result (Prelim)      No Growth To Date          Influenza A / B Rapid Test [[id number]] Collected:  12/31/18 1108    Specimen:  Nasal Wash Updated:  12/31/18 1143     Influenza A Negative     Influenza B Negative     Comment: Method: Jarvis Morgan    The sensitivity for this method is between 90% and 95% for Influenza A and around 90% for Influenza B. The specificity for both Influenza A and B is around 96%. False positive results may occur, especially when the prevalence of Influenza activity is low. This is more likely with Influenza B due to its lower prevalence. Clinical conditions, including the prevalence of influenza activity, should be considered in the interpretation of results. If clinically indicated, results may be confirmed with PCR testing.  The above 2 analytes were performed by [facility] [phone])  [address] [address]         Narrative:       Influenza A antigen detection tests are unable to distinquish between novel and seasonal influenza A.    A negative result for either Influenza A or B antigen does not exclude influenza virus infection. Clinical correlation required.    All positive influenza antigen tests (A or B) require placement of patient on droplet precaution isolation.          Imaging, reviewed and are significant for:  Radiology Results (24 Hour)     ** No results found for the last 24 hours. **            Signed:  Darral Dash, MD   SVRP R1, [phone]

## 2019-01-03 NOTE — Progress Notes (Signed)
Pharmacy Vancomycin Dosing Consult Note  Kelli Brown    Assessment:   1. Day 2 Vancomycin and Zosyn in this 89 yoF with PNA. Patient was previously on doxycycline and ceftriaxone but developed fever and leukocytosis, so antibiotic coverage was broadened.  CXR with patchy airspace disease in both upper lung zones, worse on the left, impression of multifocal pneumonia.   2. Now afebrile with worsening leukocytosis. SCr elevated. Will dose by levels at this time, may be able to convert to q48hr dosing if renal function remains stable. Blood cultures NGTD.      Plan:   1. Vancomycin 500 mg IV once then by levels.  2. Random level 2/12 @ 1400.  3. MRSA nares pending   4. Pharmacy will follow the patient's renal function, vancomycin levels, and dosing during the course of therapy. If you have any questions, please contact the pharmacist at [phone].      Indication: PNA  Goal trough: 15-20    Age: 85 y.o.  Height: 1.524 m (5')  Weight:  67.1 kg (147 lb 14.4 oz)  IBW: 45.5 kg  DW: 54.1 kg       Baseline Population Estimate Kinetics:    SCr: 1.34 mg/dL    CrCl: 20 ml/min    Ke: 0.021 hr-1        t50: 33 hrs        Vd: 37 L        Expected Trough: TBD                Historic Patient Regimen   Date Regimen Weight SCr CrCl Trough Level Adjustments to Dose              Current Patient Regimen   Date Regimen Trough Date/Time Trough Level Pt Specific Ke   2/10 1000 mg x 1      2/11 500 mg x 1 Random 2/12 @1400                      Cultures   Date Source Organism Sensitivities Resistance   2/10 Blood NGTD                                   qSOFA   Date SBP less than/equal to 100 mm Hg RR greater than/equal to 22 breaths/min GCS less than/equal to 14 Total   2/11 0 0 0 0     Recent Labs   Lab 01/03/19  0443 01/02/19  0512 01/01/19  0653 12/31/18  1607 12/31/18  1108   Creatinine 1.32* 1.34* 1.31* 1.30* 1.31*   BUN 31* 28* 20  --  20   WBC 13.9* 12.4* 8.6  --  7.8     Temp (24hrs), Avg:97.9 F (36.6 C), Min:97 F (36.1 C),  Max:99.5 F (37.5 C)    Vitals:    01/03/19 1134   BP:    Pulse:    Resp:    Temp:    SpO2: 90%

## 2019-01-03 NOTE — Plan of Care (Addendum)
NURSE NOTE SUMMARY  [facility] - GI/ENDO/GEN MED   Patient Name: Kelli Brown   Attending Physician: Eben Burow, MD   Today's date:   01/03/2019 LOS: 3 days   Shift Summary:                                                              1900 Assumed care of patient. Pt currently on high flow NC. Pt anxious. Reassured pt. Bell alarm on. Call bell within reach.    0051 Pt with flushed face and warm to touch. Rectal temp 98.8. Pt oriented to all questions except time.     End of Shift: Called Rapid Response concerning pt's confusion. Oxygen above 95%, on high flow. Pt assisted to bathroom as needed. Reoriented.   Provider Notifications:      Rapid Response Notifications:  Mobility:      PMP Activity: Step 6 - Walks in Room (01/02/2019  8:00 PM)     Weight tracking:  Family Dynamic:   Last 3 Weights for the past 72 hrs (Last 3 readings):   Weight   01/02/19 0349 67.1 kg (147 lb 14.4 oz)   01/01/19 0321 66.9 kg (147 lb 7.8 oz)   12/31/18 1601 66.9 kg (147 lb 7.8 oz)             Recent Vitals Last Bowel Movement   BP: 106/61 (01/03/2019  4:26 AM)  Heart Rate: 90 (01/03/2019  4:26 AM)  Temp: 97.3 F (36.3 C) (01/03/2019  4:26 AM)  Resp Rate: 18 (01/03/2019  4:26 AM)  Weight: 67.1 kg (147 lb 14.4 oz) (01/02/2019  3:49 AM)  SpO2: 98 % (01/03/2019  4:26 AM)   No data recorded        Problem: Moderate/High Fall Risk Score >5  Goal: Patient will remain free of falls  Outcome: Progressing  Flowsheets  Taken 01/01/2019 2130 by Clover Mealy, RN  VH Moderate Risk (6-13): ALL REQUIRED LOW INTERVENTIONS;INITIATE YELLOW "FALL RISK" SIGNAGE;YELLOW NON-SKID SLIPPERS;YELLOW "FALL RISK" ARM BAND;USE OF BED EXIT ALARM IF PATIENT IS CONFUSED OR IMPULSIVE. PLACE RESET BED ALARM SIGN ABOVE BED;PLACE FALL RISK LEVEL ON Brown BOARD FOR COMMUNICATION PURPOSES IN PATIENT'S ROOM;Include family/significant other in multidisciplinary discussion regarding plan of care as appropriate;Remain with patient during toileting  Taken  01/02/2019 2000 by Rande Lawman, RN  [facility] High Risk (Greater than 13): ALL REQUIRED LOW INTERVENTIONS;ALL REQUIRED MODERATE INTERVENTIONS;RED "HIGH FALL RISK" SIGNAGE;BED ALARM WILL BE ACTIVATED WHEN THE PATEINT IS IN BED WITH SIGNAGE "RESET BED ALARM";A CHAIR PAD ALARM WILL BE USED WHEN PATIENT IS UP SITTING IN A CHAIR;PATIENT IS TO BE SUPERVISED FOR ALL TOILETING ACTIVITIES     Problem: Compromised Tissue integrity  Goal: Damaged tissue is healing and protected  Outcome: Progressing  Flowsheets (Taken 01/01/2019 0226 by Clover Mealy, RN)  Damaged tissue is healing and protected : Monitor/assess Braden scale every shift;Reposition patient every 2 hours and as needed unless able to reposition self;Increase activity as tolerated/progressive mobility;Relieve pressure to bony prominences for patients at moderate and high risk;Keep intact skin clean and dry  Goal: Nutritional status is improving  Outcome: Progressing     Problem: Compromised Hemodynamic Status  Goal: Vital signs and fluid balance maintained/improved  Outcome: Progressing  Flowsheets (Taken 01/01/2019 1802  by Caryl Bis, RN)  Vital signs and fluid balance are maintained/improved: Position patient for maximum circulation/cardiac output;Monitor/assess vitals and hemodynamic parameters with position changes;Monitor intake and output. Notify LIP if urine output is less than 30 mL/hour.;Monitor/assess lab values and report abnormal values     Problem: Inadequate Gas Exchange  Goal: Adequate oxygenation and improved ventilation  Outcome: Progressing  Flowsheets (Taken 01/01/2019 1802 by Caryl Bis, RN)  Adequate oxygenation and improved ventilation: Assess lung sounds;Monitor SpO2 and treat as needed;Position for maximum ventilatory efficiency;Plan activities to conserve energy: plan rest periods;Increase activity as tolerated/progressive mobility;Consult/collaborate with Respiratory Therapy     Problem: Inadequate Airway Clearance  Goal: Normal  respiratory rate/effort achieved/maintained  Outcome: Progressing  Flowsheets (Taken 01/01/2019 1802 by Caryl Bis, RN)  Normal respiratory rate/effort achieved/maintained: Plan activities to conserve energy: plan rest periods

## 2019-01-03 NOTE — Consults (Signed)
[facility] pulmonary  Consultation Note      Patient Name: Kelli Brown, Kelli Brown  Date/Time: 01/03/19 5:59 PM  Attending Physician: Eben Burow, MD  Primary Care Physician: Flossie Dibble, MD  Location/Room: [phone]     Chief Complaint:     Chief Complaint   Patient presents with    low saturation    Fatigue         HPI:   Kindly asked to consult on [name] who is a 85 y.o. female for persistent hypoxemia.  Patient was admitted on February 8 with acute hypoxic respiratory failure.  Concern for fluid overload and pneumonia.  She is been treated with antibiotics and diuresis.  Unfortunately there is been minimal improvement.  Today the patient does feel like she is doing better.  Minimal cough.  No fevers or chills.  No chest pain.  She has been afebrile for close to 24 hours.  Cultures are negative.       Assessment:     Active Hospital Problems    Diagnosis    Acute respiratory failure with hypoxia    Hypertension    Chronic kidney disease, stage III (moderate)    Hyperlipidemia    Acute exacerbation of CHF (congestive heart failure)    Pneumonia         Plan:     Cardiovascular: 1.  Pulmonary hypertension-seen on the most recent echo.  In October 2018 the RV systolic pressures were in the 50s.  Primary treatment will be aggressive diuresis.   CT of the chest does not show any significant interstitial lung disease.  Patient may benefit from a further outpatient evaluation including PFTs and repeat CT.      Pulmonary: 1.  Acute on  hypoxic respiratory failure-secondary to the pulmonary hypertension.  Likely multifactorial.  Procalcitonin is elevated.  Agree with treatment for the pneumonia.  Reimaging should be done.  2.  Interstitial lung disease-seen on CT.  Likely fluid overload however inflammatory/infectious is a possibility.  No.  CTs to compare to to see what baseline is like.  Chest x-ray from March 2019 shows hyperinflation suggestive of COPD.  Unable to locate any outpatient  PFTs    Gastrointestinal:     Infectious Disease: Finish the antibiotics.  Brown count is trending up but that is likely related to steroids.  Procalcitonin was still elevated.    Neurologic:     Renal: Follow BUN/creatinine.        Prognosis: Unclear.  Patient is 85 years old with pulmonary hypertension  Code Status: NO CPR - SUPPORT OK    Past Medical History:     Past Medical History:   Diagnosis Date    Abnormal vision     Arthritis     Atrial fibrillation     Congestive heart failure     Glaucoma     Hip fx, right, closed, initial encounter 2010    Hyperlipidemia     Hypertension     Low back pain     Macular degeneration     Nonrheumatic aortic (valve) stenosis 08/11/2017    Shingles        Available old records reviewed, including: Echo, chest x-ray    Past Surgical History:     Past Surgical History:   Procedure Laterality Date    APPENDECTOMY      EYE SURGERY      cateracts    HIP SURGERY Bilateral     Tubes tied    TONSILLECTOMY  TUBAL LIGATION         Inpatient Medications     Medications Prior to Admission   Medication Sig    amiodarone (PACERONE) 100 MG tablet Take 100 mg by mouth daily    aspirin EC 81 MG EC tablet Take 81 mg by mouth every morning.        calcium carbonate 1500 (600 Ca) MG Tab tablet Take 1,500 mg by mouth every morning    dorzolamide (TRUSOPT) 2 % ophthalmic solution Place 1 drop into the left eye 3 (three) times daily.       folic acid (FOLVITE) 1 MG tablet Take 1 tablet (1 mg total) by mouth daily    furosemide (LASIX) 40 MG tablet Take 40 mg by mouth Once each morning except Saturday and Sunday.    metoprolol XL (TOPROL-XL) 50 MG 24 hr tablet Take 50 mg by mouth every morning.        Multiple Vitamins-Minerals (CENTRUM WOMEN PO) Take 1 tablet by mouth every morning.    potassium chloride (K-DUR,KLOR-CON) 20 MEQ tablet Take 20 mEq by mouth every morning       pravastatin (PRAVACHOL) 40 MG tablet Take 40 mg by mouth every morning       vitamin D  (CHOLECALCIFEROL) 25 MCG (1000 UT) tablet Take 1,000 Units by mouth every morning    warfarin (COUMADIN) 5 MG tablet Take by mouth See Admin Instructions 2.5mg  on all days EXCEPT 5mg  on Thu and Sat       //vs MEDSSCHEDULED?MEDSINFUSIONS?RRSCHEDIVMED?    Family History:     Family History   Problem Relation Age of Onset    Stroke Mother     Hypertension Mother     Coronary artery disease Mother     Heart disease Mother     Cancer Father     Stroke Sister     CABG Brother        Social History:     Social History     Socioeconomic History    Marital status: Widowed     Spouse name: Not on file    Number of children: Not on file    Years of education: Not on file    Highest education level: Not on file   Occupational History    Not on file   Social Needs    Financial resource strain: Not on file    Food insecurity:     Worry: Not on file     Inability: Not on file    Transportation needs:     Medical: Not on file     Non-medical: Not on file   Tobacco Use    Smoking status: Never Smoker    Smokeless tobacco: Never Used   Substance and Sexual Activity    Alcohol use: No    Drug use: No    Sexual activity: Not Currently     Birth control/protection: Post-menopausal   Lifestyle    Physical activity:     Days per week: Not on file     Minutes per session: Not on file    Stress: Not on file   Relationships    Social connections:     Talks on phone: Not on file     Gets together: Not on file     Attends religious service: Not on file     Active member of club or organization: Not on file     Attends meetings of clubs or organizations: Not on file  Relationship status: Not on file    Intimate partner violence:     Fear of current or ex partner: Not on file     Emotionally abused: Not on file     Physically abused: Not on file     Forced sexual activity: Not on file   Other Topics Concern    Not on file   Social History Narrative    Not on file       Allergies:     Allergies   Allergen Reactions     Codeine Nausea And Vomiting and Other (See Comments)     LIGHTHEADEDNESS.    Darvon [Propoxyphene] Nausea And Vomiting and Other (See Comments)     LIGHTHEADEDNESS.    Phenobarbital Swelling and Rash       Review of Systems:   A comprehensive review of systems was negative except for: Constitutional: positive for fatigue  Respiratory: positive for dyspnea on exertion    All other systems were reviewed and are negative except:     Physical Exam:   Vitals:   Vitals:    01/03/19 1632   BP: 117/54   Pulse: 98   Resp: 17   Temp: 97.7 F (36.5 C)   SpO2: 92%     I/O:     Intake/Output Summary (Last 24 hours) at 01/03/2019 1759  Last data filed at 01/03/2019 1438  Gross per 24 hour   Intake    Output 500 ml   Net -500 ml     Vent settings: FiO2:  [80 %-90 %] 80 %  Height/Weights:   Darnelle Bos   Demetrius Charity   //admission wt   IBW: IBW: Vent Settings  FiO2: 80 % //? If correct   BMI: Body mass index is 28.88 kg/m.  Hemodynamics: //what these are and whether correct                  General Appearance:  oriented to person, place, and time, acyanotic, in no respiratory distress and chronically ill appearing    Mental status: alert, oriented to person, place, and time, normal mood, behavior, speech, dress, motor activity, and thought processes    Neuro: alert, oriented, normal speech, no focal findings or movement disorder noted, neck supple without rigidity, cranial nerves II through XII intact, motor and sensory grossly normal bilaterally    HEENT: ENT exam normal, no neck nodes or sinus tenderness    Neck: supple, no significant adenopathy    Lungs: no tachypnea, retractions or cyanosis, decreased air entry noted bilaterally    Cardiac: normal rate, regular rhythm, normal S1, S2, no murmurs, rubs, clicks or gallops    Abdomen: soft, nontender, nondistended, no masses or organomegaly    Extremities: peripheral pulses normal, no pedal edema, no clubbing or cyanosis    Skin:     Other:       Labs:     CBC:   Recent Labs      01/03/19  0443   WBC 13.9*   Hemoglobin 10.3*   Hematocrit 32.7*       Coags:   Recent Labs     01/03/19  0443   PT 14.0*   PT INR 1.4*       ABGs:  No results found for: Paul Dykes, PHART, PCO2ART, PO2ART, HCO3ART, BEART, O2SATART   Chemistry:   Recent Labs     01/03/19  0443  01/01/19  0653   Sodium 139   < > 138   Potassium 4.0   < >  3.7   Chloride 102   < > 102   CO2 24   < > 24   BUN 31*   < > 20   Creatinine 1.32*   < > 1.31*   EGFR 34*   < > 34*   Glucose 227*   < > 118*   Calcium 9.2   < > 8.9   Magnesium  --   --  2.0   Phosphorus  --   --  2.9    < > = values in this interval not displayed.       LFTs:   Recent Labs   Lab 01/03/19  0443  01/01/19  0653   Albumin  --   --  2.7*   Protein, Total  --   --  6.1   Bilirubin, Total  --   --  0.7   Alkaline Phosphatase  --   --  85   ALT  --   --  9   AST (SGOT)  --   --  20   Glucose 227*  More results in Results Review 118*   More results in Results Review = values in this interval not displayed.       //NEED: Lactic, Bands, BGs       Other:         Radiology / Imaging:     Imaging personally reviewed by me, including Chest CT    Current treatment team:   Treatment Team:   Attending Provider: Eben Burow, MD  Consulting Physician: Eben Burow, MD  Resident: Venia Carbon, MD  Consulting Physician: Teresita Madura, MD    Attestation & Billing:         Billing Level: consult 4    Signed by: Teresita Madura, MD   ZO:[id number], Dalbert Garnet, MD

## 2019-01-03 NOTE — Progress Note - Problem Oriented Charting Notewrit (Signed)
Pharmacy Consult Warfarin Dosing  Kelli Brown    Age: 85 y.o.  Weight: 67.1 kg (147 lb 14.4 oz)  Indication: a fib  Goal INR: 2-3  Home warfarin dose =   2.5 mg daily except 5mg  Thurs and Sat  Interacting agent/disease: amiodarone, aspirin, zosyn    Subjective/Objective:   89 yof admitted from assisted living with hypoxia and lethargy. Takes warfarin as outpatient reportedly at above dosage, however it is not on patient's home med list, per facility home med list is not regularly updated    Assessment/Plan:   Vitamin K 5 mg po on 2/9      INR today 1.4   Give warfarin 5 mg today.   Daily INR   Monitor for signs and symptoms of bleeding   Avoid IM injections      Current warfarin dose =(hold for INR greater than 3.5)  Date 2/8 2/9 2/10 2/11        INR 8.8 9.7 1.5 1.4        dose Hold hold 5 mg 5 mg        Hgb 11.1 10.3 9.8 10.3        Plt 259 245 247 286            Past Medical History:   Diagnosis Date    Abnormal vision     Arthritis     Atrial fibrillation     Congestive heart failure     Glaucoma     Hip fx, right, closed, initial encounter 2010    Hyperlipidemia     Hypertension     Low back pain     Macular degeneration     Nonrheumatic aortic (valve) stenosis 08/11/2017    Shingles         Recent Labs   Lab 01/01/19  0653   Bilirubin, Total 0.7   Bilirubin, Direct 0.3   Protein, Total 6.1   Albumin 2.7*   ALT 9   AST (SGOT) 20             If you have any questions, please contact the pharmacist at [phone].    Zigmund Gottron, PharmD

## 2019-01-04 ENCOUNTER — Inpatient Hospital Stay: Payer: Medicare Other

## 2019-01-04 LAB — CBC AND DIFFERENTIAL
Bands: 1 % (ref 0–10)
Basophils %: 0 % (ref 0.0–3.0)
Basophils Absolute: 0 10*3/uL (ref 0.0–0.3)
Eosinophils %: 0 % (ref 0.0–7.0)
Eosinophils Absolute: 0 10*3/uL (ref 0.0–0.8)
Hematocrit: 31.1 % — ABNORMAL LOW (ref 36.0–48.0)
Hemoglobin: 9.9 gm/dL — ABNORMAL LOW (ref 12.0–16.0)
Lymphocytes Absolute: 0.7 10*3/uL (ref 0.6–5.1)
Lymphocytes: 4 % — ABNORMAL LOW (ref 15.0–46.0)
MCH: 32 pg (ref 28–35)
MCHC: 32 gm/dL (ref 32–36)
MCV: 100 fL (ref 80–100)
MPV: 7.1 fL (ref 6.0–10.0)
Monocytes Absolute: 1 10*3/uL (ref 0.1–1.7)
Monocytes: 6 % (ref 3.0–15.0)
Neutrophils %: 89 % — ABNORMAL HIGH (ref 42.0–78.0)
Neutrophils Absolute: 14.9 10*3/uL — ABNORMAL HIGH (ref 1.7–8.6)
PLT CT: 271 10*3/uL (ref 130–440)
RBC: 3.11 10*6/uL — ABNORMAL LOW (ref 3.80–5.00)
RDW: 17.4 % — ABNORMAL HIGH (ref 11.0–14.0)
WBC: 16.5 10*3/uL — ABNORMAL HIGH (ref 4.0–11.0)

## 2019-01-04 LAB — COMPREHENSIVE METABOLIC PANEL
ALT: 18 U/L (ref 0–55)
AST (SGOT): 23 U/L (ref 10–42)
Albumin/Globulin Ratio: 0.7 Ratio — ABNORMAL LOW (ref 0.80–2.00)
Albumin: 2.6 gm/dL — ABNORMAL LOW (ref 3.5–5.0)
Alkaline Phosphatase: 96 U/L (ref 40–145)
Anion Gap: 17.7 mMol/L (ref 7.0–18.0)
BUN / Creatinine Ratio: 27.1 Ratio (ref 10.0–30.0)
BUN: 39 mg/dL — ABNORMAL HIGH (ref 7–22)
Bilirubin, Total: 0.9 mg/dL (ref 0.1–1.2)
CO2: 24 mMol/L (ref 20–30)
Calcium: 8.9 mg/dL (ref 8.5–10.5)
Chloride: 104 mMol/L (ref 98–110)
Creatinine: 1.44 mg/dL — ABNORMAL HIGH (ref 0.60–1.20)
EGFR: 30 mL/min/{1.73_m2} — ABNORMAL LOW (ref 60–150)
Globulin: 3.7 gm/dL (ref 2.0–4.0)
Glucose: 183 mg/dL — ABNORMAL HIGH (ref 71–99)
Osmolality Calculated: 297 mOsm/kg (ref 275–300)
Potassium: 3.7 mMol/L (ref 3.5–5.3)
Protein, Total: 6.3 gm/dL (ref 6.0–8.3)
Sodium: 142 mMol/L (ref 136–147)

## 2019-01-04 LAB — PT/INR
PT INR: 2.1 — ABNORMAL HIGH (ref 0.5–1.3)
PT: 21.7 s — ABNORMAL HIGH (ref 9.5–11.5)

## 2019-01-04 LAB — VH MRSA DETECTION BY DNA AMPLIFICATION (NARES): MRSA: NOT DETECTED

## 2019-01-04 MED ORDER — LORAZEPAM 0.5 MG PO TABS
0.2500 mg | ORAL_TABLET | ORAL | Status: DC | PRN
Start: 2019-01-04 — End: 2019-01-04

## 2019-01-04 MED ORDER — LORAZEPAM 0.5 MG PO TABS
0.2500 mg | ORAL_TABLET | Freq: Four times a day (QID) | ORAL | Status: DC | PRN
Start: 2019-01-04 — End: 2019-01-18
  Administered 2019-01-07: 0.25 mg via ORAL
  Filled 2019-01-04: qty 1

## 2019-01-04 MED ORDER — FUROSEMIDE 20 MG PO TABS
20.0000 mg | ORAL_TABLET | Freq: Two times a day (BID) | ORAL | Status: DC
Start: 2019-01-04 — End: 2019-01-08
  Administered 2019-01-04 – 2019-01-08 (×8): 20 mg via ORAL
  Filled 2019-01-04 (×9): qty 1

## 2019-01-04 NOTE — Respiratory Progress Note (Signed)
RESPIRATORY SERVICES CARE PLAN   Patient Name: Kelli Brown   Attending Physician: Eben Burow, MD   Today's date:    01/04/2019 LOS: 4 days   CURRENT THERAPY  WEANING ATTEMPT    High Flow    Patient is not tolerating weaning attempts this shift.  See reasoning below.   OXYGEN THERAPY INCREASED REQUREMENTS THIS SHIFT   SpO2: 95 % (01/03/2019 10:45 PM)  O2 Device: HFNC (01/03/2019  8:03 PM)  FiO2: 80 % (01/03/2019  8:03 PM)  O2 Flow Rate (L/min): 50 L/min (01/03/2019  8:03 PM)           LAST ABG AIRWAY   pH  7.49*  (02/11 1625)  PO2  59* mm Hg (02/11 1625)  PCO2  40.0 mm Hg (02/11 1625)  HCO3  30.4* mMol/L (02/11 1625)  O2 Sat %  92* % (02/11 1625)     RECOMMENDATIONS/COMMENTS:                                                               Unable to wean HFNC on this shift. Pt desaturates with any exertion.

## 2019-01-04 NOTE — Plan of Care (Signed)
Notified RRT of pt on high flow 80%, 50L.

## 2019-01-04 NOTE — Progress Note - Problem Oriented Charting Notewrit (Signed)
Pharmacy Consult Warfarin Dosing  Ferol Luz Regula    Age: 85 y.o.  Weight: 67.9 kg (149 lb 12.8 oz)  Indication: a fib  Goal INR: 2-3  Home warfarin dose =   2.5 mg daily except 5mg  Thurs and Sat  Interacting agent/disease: amiodarone, aspirin, zosyn    Subjective/Objective:   89 yof admitted from assisted living with hypoxia and lethargy. Takes warfarin as outpatient reportedly at above dosage, however it is not on patient's home med list, per facility home med list is not regularly updated    Assessment/Plan:   Vitamin K 5 mg po on 2/9      INR today 2.1 - up by 0.7 from day before.    Hold warfarin tonight   Daily INR   Monitor for signs and symptoms of bleeding   Avoid IM injections      Current warfarin dose =(hold for INR greater than 3.5)  Date 2/8 2/9 2/10 2/11 2/12       INR 8.8 9.7 1.5 1.4 2.1       dose Hold hold 5 mg 5 mg Hold       Hgb 11.1 10.3 9.8 10.3 9.9       Plt 259 245 247 286 271           Past Medical History:   Diagnosis Date    Abnormal vision     Arthritis     Atrial fibrillation     Congestive heart failure     Glaucoma     Hip fx, right, closed, initial encounter 2010    Hyperlipidemia     Hypertension     Low back pain     Macular degeneration     Nonrheumatic aortic (valve) stenosis 08/11/2017    Shingles         Recent Labs   Lab 01/04/19  0629 01/01/19  0653   Bilirubin, Total 0.9 0.7   Bilirubin, Direct  --  0.3   Protein, Total 6.3 6.1   Albumin 2.6* 2.7*   ALT 18 9   AST (SGOT) 23 20             If you have any questions, please contact the pharmacist at [phone].    [name], PharmD

## 2019-01-04 NOTE — Plan of Care (Addendum)
NURSE NOTE SUMMARY  [facility] - GI/ENDO/GEN MED   Patient Name: [facility] WHITE   Attending Physician: Eben Burow, MD   Today's date:   01/04/2019 LOS: 4 days   Shift Summary:                                                              1900 Assumed care of patient. Pt allowed to have small sips of water and ice chips with supervision. Pt does not choke while drinking fluids. Pt has Hi Flow NC in use     Pt stating  92-96% on 80% 50mL of High Flow NC. Pt assisted to bedside commode. Reoriented when appropriate.    Provider Notifications:      Rapid Response Notifications:  Mobility:      PMP Activity: Step 4 - Dangle at Bedside (01/03/2019  9:00 PM)     Weight tracking:  Family Dynamic:   Last 3 Weights for the past 72 hrs (Last 3 readings):   Weight   01/02/19 0349 67.1 kg (147 lb 14.4 oz)   01/01/19 0321 66.9 kg (147 lb 7.8 oz)             Recent Vitals Last Bowel Movement   BP: 146/70 (01/03/2019 10:45 PM)  Heart Rate: 96 (01/03/2019 10:45 PM)  Temp: 97 F (36.1 C) (01/03/2019 10:45 PM)  Resp Rate: 20 (01/03/2019 10:45 PM)  SpO2: 95 % (01/03/2019 10:45 PM)   No data recorded        Problem: Moderate/High Fall Risk Score >5  Goal: Patient will remain free of falls  Outcome: Progressing  Flowsheets  Taken 01/01/2019 2130 by Clover Mealy, RN  VH Moderate Risk (6-13): ALL REQUIRED LOW INTERVENTIONS;INITIATE YELLOW "FALL RISK" SIGNAGE;YELLOW NON-SKID SLIPPERS;YELLOW "FALL RISK" ARM BAND;USE OF BED EXIT ALARM IF PATIENT IS CONFUSED OR IMPULSIVE. PLACE RESET BED ALARM SIGN ABOVE BED;PLACE FALL RISK LEVEL ON WHITE BOARD FOR COMMUNICATION PURPOSES IN PATIENT'S ROOM;Include family/significant other in multidisciplinary discussion regarding plan of care as appropriate;Remain with patient during toileting  Taken 01/03/2019 2100 by Rande Lawman, RN  VH High Risk (Greater than 13): ALL REQUIRED LOW INTERVENTIONS;ALL REQUIRED MODERATE INTERVENTIONS;RED "HIGH FALL RISK" SIGNAGE;BED ALARM WILL BE ACTIVATED  WHEN THE PATEINT IS IN BED WITH SIGNAGE "RESET BED ALARM";A CHAIR PAD ALARM WILL BE USED WHEN PATIENT IS UP SITTING IN A CHAIR;PATIENT IS TO BE SUPERVISED FOR ALL TOILETING ACTIVITIES     Problem: Compromised Tissue integrity  Goal: Damaged tissue is healing and protected  Outcome: Progressing  Flowsheets (Taken 01/01/2019 0226 by Clover Mealy, RN)  Damaged tissue is healing and protected : Monitor/assess Braden scale every shift;Reposition patient every 2 hours and as needed unless able to reposition self;Increase activity as tolerated/progressive mobility;Relieve pressure to bony prominences for patients at moderate and high risk;Keep intact skin clean and dry  Goal: Nutritional status is improving  Outcome: Progressing     Problem: Compromised Hemodynamic Status  Goal: Vital signs and fluid balance maintained/improved  Outcome: Progressing  Flowsheets (Taken 01/01/2019 1802 by Caryl Bis, RN)  Vital signs and fluid balance are maintained/improved: Position patient for maximum circulation/cardiac output;Monitor/assess vitals and hemodynamic parameters with position changes;Monitor intake and output. Notify LIP if urine output is less than 30 mL/hour.;Monitor/assess lab values  and report abnormal values     Problem: Inadequate Gas Exchange  Goal: Adequate oxygenation and improved ventilation  Outcome: Progressing  Flowsheets (Taken 01/01/2019 1802 by Caryl Bis, RN)  Adequate oxygenation and improved ventilation: Assess lung sounds;Monitor SpO2 and treat as needed;Position for maximum ventilatory efficiency;Plan activities to conserve energy: plan rest periods;Increase activity as tolerated/progressive mobility;Consult/collaborate with Respiratory Therapy     Problem: Inadequate Airway Clearance  Goal: Normal respiratory rate/effort achieved/maintained  Outcome: Progressing  Flowsheets (Taken 01/01/2019 1802 by Caryl Bis, RN)  Normal respiratory rate/effort achieved/maintained: Plan activities to  conserve energy: plan rest periods

## 2019-01-04 NOTE — Plan of Care (Addendum)
NURSE NOTE SUMMARY  [facility] - GI/ENDO/GEN MED   Patient Name: [facility] WHITE   Attending Physician: Eben Burow, MD   Today's date:   01/04/2019 LOS: 4 days   Shift Summary:                                                              Uneventful shift. VSS. Assessment as noted. Meds given as ordered. Pt repositioned frequently to remain upright to maintain O2 sats. Pt frequently moaning and whining but when asked what's wrong or if she's in pain she states "I'm just miserable". Family currently present at bedside. Denies needs currently. Will monitor through end of shift.      Provider Notifications:      Rapid Response Notifications:  Mobility:      PMP Activity: Step 4 - Dangle at Bedside (01/04/2019 10:14 AM)     Weight tracking:  Family Dynamic:   Last 3 Weights for the past 72 hrs (Last 3 readings):   Weight   01/04/19 0450 67.9 kg (149 lb 12.8 oz)   01/02/19 0349 67.1 kg (147 lb 14.4 oz)             Recent Vitals Last Bowel Movement   BP: 100/44 (01/04/2019  3:33 PM)  Heart Rate: 94 (01/04/2019  3:33 PM)  Temp: 97.9 F (36.6 C) (01/04/2019  3:33 PM)  Resp Rate: 20 (01/04/2019  3:33 PM)  Weight: 67.9 kg (149 lb 12.8 oz) (01/04/2019  4:50 AM)  SpO2: 90 % (01/04/2019  5:15 PM)   No data recorded       Problem: Moderate/High Fall Risk Score >5  Goal: Patient will remain free of falls  Outcome: Progressing  Flowsheets  Taken 01/01/2019 2130 by Clover Mealy, RN  VH Moderate Risk (6-13): ALL REQUIRED LOW INTERVENTIONS;INITIATE YELLOW "FALL RISK" SIGNAGE;YELLOW NON-SKID SLIPPERS;YELLOW "FALL RISK" ARM BAND;USE OF BED EXIT ALARM IF PATIENT IS CONFUSED OR IMPULSIVE. PLACE RESET BED ALARM SIGN ABOVE BED;PLACE FALL RISK LEVEL ON WHITE BOARD FOR COMMUNICATION PURPOSES IN PATIENT'S ROOM;Include family/significant other in multidisciplinary discussion regarding plan of care as appropriate;Remain with patient during toileting  Taken 01/04/2019 1014 by Garth Bigness, RN  VH High Risk (Greater than 13):  PATIENT IS TO BE SUPERVISED FOR ALL TOILETING ACTIVITIES;A CHAIR PAD ALARM WILL BE USED WHEN PATIENT IS UP SITTING IN A CHAIR;BED ALARM WILL BE ACTIVATED WHEN THE PATEINT IS IN BED WITH SIGNAGE "RESET BED ALARM";RED "HIGH FALL RISK" SIGNAGE;ALL REQUIRED MODERATE INTERVENTIONS;ALL REQUIRED LOW INTERVENTIONS     Problem: Compromised Tissue integrity  Goal: Damaged tissue is healing and protected  Outcome: Progressing  Flowsheets (Taken 01/01/2019 0226 by Clover Mealy, RN)  Damaged tissue is healing and protected : Monitor/assess Braden scale every shift;Reposition patient every 2 hours and as needed unless able to reposition self;Increase activity as tolerated/progressive mobility;Relieve pressure to bony prominences for patients at moderate and high risk;Keep intact skin clean and dry     Problem: Compromised Hemodynamic Status  Goal: Vital signs and fluid balance maintained/improved  Outcome: Progressing  Flowsheets (Taken 01/01/2019 1802 by Caryl Bis, RN)  Vital signs and fluid balance are maintained/improved: Position patient for maximum circulation/cardiac output;Monitor/assess vitals and hemodynamic parameters with position changes;Monitor intake and output. Notify LIP if urine output is  less than 30 mL/hour.;Monitor/assess lab values and report abnormal values

## 2019-01-04 NOTE — Progress Notes (Signed)
Readmission Risk  [facility] - GI/ENDO/GEN MED   Patient Name: [facility] WHITE   Attending Physician: Eben Burow, MD   Today's date:   01/04/2019 LOS: 4 days   Expected Discharge Date      Readmission Assessment:                                                              Discharge Planning  ReAdmit Risk Score: 22  Does the patient have perscription coverage?: Yes  Utilize [place] [facility]: No  DME anticipated at discharge: Home O2(tbd)  Anticipated Placement at [place]: (TBd)  CM Comments: 01/04/2019 RNCM-EW-Acute hypoxic respiratory failure likely due to pneumonia/diastolic heart failure, High flow o2 50L 70%., IV abx, IV solumedrol, cxr,  IV lasix, strict I&O, Pulmonology following.  Pt reports she is indep at baseline uses a rollater to ambulate,  lives at [facility] in [place] city and plans to return at [facility].  With pt permission Cm called grandtr Irving Burton and left vm re [place] planning discussion-pt not medically ready.   Awaiting call back.  Cm following clinical course and o2 needs.        IDPA:   Patient Type  Within 30 Days of Previous Admission?: No  Healthcare Decisions  Interviewed:: Patient  Interviewee Contact Information:: vm left for gandtr  Orientation/Decision Making Abilities of Patient: Alert and Oriented x3, able to make decisions(at time of rounding )  Advance Directive: Patient has advance directive, copy in chart  Healthcare Agent Appointed: Yes  Prior to admission  Prior level of function: Independent with ADLs(per pt)  Type of Residence: Assisted living  Home Layout: One level(pt lives at [facility])  Have running water, electricity, heat, etc?: Yes  Living Arrangements: Alone  Dressing: Independent  Grooming: Independent  Feeding: Independent  Bathing: Independent  Toileting: Independent  DME Currently at Home: Environmental consultant, [employer], Other (Comment)(rollater)  Name of Prior Assisted Living Facility: [name] in [place]  Discharge Planning  Support Systems: Family  members  Patient expects to be discharged to:: [facility]  Anticipated [place] plan discussed with:: Same as interviewed  Mode of transportation:: (tbd)  Does the patient have perscription coverage?: Yes         30 Day Readmission:       Provider Notifications:      Trinna Balloon [facility]  Ext [phone]

## 2019-01-04 NOTE — Progress Notes (Signed)
Medicine Progress Note - [facility]      Date Time: 01/04/19 6:24 AM  Patient Name: Kelli Brown,Kelli Brown  Attending Physician: Eben Burow, MD    Assessment:                                                                                        Active Problems:    Pneumonia    Acute exacerbation of CHF (congestive heart failure)    Acute respiratory failure with hypoxia    Hypertension    Chronic kidney disease, stage III (moderate)    Hyperlipidemia        Plan:                                                                                                      Acute hypoxic respiratory failurelikelydue to pneumonia/diastolic heart failure  Continues to require HFNC 50L/min FiO2 of 80%, unable to wean  Dr. Jacqlyn Larsen (pulm) consulted, agrees with current management   Maintain oxygen saturation >90%    Pneumonia/ Pneumonitis  CXR/CT: consistent with multifocal pneumonia/pneumonitis  MRSA nares neg, will discontinue vanc  Continue Zosyn 2.25 g q8h  Blood cx NGTD  Repeat CXR similar to previous studies  Solumederol 40 mg q8h    Acute on chronic diastolic heart failure/moderate aortic stenosis  Switched to oral lasix 20 mg BID  Strict I's/O, daily weights  Down 5 lbs from admission  Echo: LVH, stable aortic stenosis, moderate tricuspid regurg, moderate to severe pulmonary artery pressure    CKD stage III  Creatinine stable ~1.3  Monitor while on lasix    Mild anemia due to CKD  Monitor h/H    Paroxysmal atrial fibrillation  Amiodarone100mg    Metoprolol 50mg   Pharmacy to dosewarfarin    Hypertension controlled  Metoprolol50mg     Hyperlipidemia  Statin40mg     Supra therapeutic INR, resolved  Pharmacy to dose warfarin    DVT PPx: Warfarin  Lines:                                                                                                       Patient Lines/Drains/Airways Status    Active PICC Line / CVC Line / PIV Line / Drain / Airway / Intraosseous Line /  Epidural Line / ART Line /  Line / Wound / Pressure Ulcer / NG/OG Tube     Name:   Placement date:   Placement time:   Site:   Days:    Peripheral IV 01/01/19 Right Forearm   01/01/19    0030    Forearm   3                Disposition:                                                                                             Today's date: 01/04/2019  Length of Stay: 4  Code Status: NO CPR - SUPPORT OK  Anticipated medical stability for discharge in : 4 days    Subjective                                                                                             CC: <principal problem not specified>     Patient reports she is very thirsty and hungry. She feels weak from not eating. She states she becomes very short of breath any time she exerts herself. She denies chest pain, palpiations, abdominal pain.       Review of Systems:                                                                               Review of Systems   Constitutional: Negative for chills and fever.   HENT: Negative for congestion and sore throat.    Respiratory: Positive for cough and shortness of breath.    Cardiovascular: Negative for chest pain and leg swelling.   Gastrointestinal: Negative for abdominal pain, diarrhea, nausea and vomiting.   Genitourinary: Negative for dysuria, frequency and urgency.   Musculoskeletal: Negative for arthralgias and myalgias.   Skin: Negative for pallor and rash.   Neurological: Negative for dizziness and headaches.       Physical Exam:                                                                                     Temp:  [96.8 F (36 C)-97.7 F (36.5 C)] 97.2 F (36.2  C)  Heart Rate:  [93-107] 93  Resp Rate:  [17-22] 22  BP: (109-146)/(46-70) 109/46  FiO2:  [80 %-90 %] 80 %    Intake/Output Summary (Last 24 hours) at 01/04/2019 0624  Last data filed at 01/04/2019 0450  Gross per 24 hour   Intake 240 ml   Output 600 ml   Net -360 ml       General: Awake, alert, oriented x 3; No acute distress.  HEENT:  EOMI, Sclera anicteric  Oropharynx clear without lesions, mucous membranes dry  Glands: No cervical or axillary lymphadenopathy.  Neck: Supple, no lymphadenopathy, no thyromegaly  Cardiovascular: RRR, no murmurs, rubs or gallops  Lungs: CTAB, without wheezes, rhonchi, or rales  Abdomen: Soft, non-tender, non-distended; no palpable masses, normoactive bowel sounds, no rebound or guarding  Extremities: No clubbing, cyanosis, or edema  Neuro: Cranial nerves grossly intact, strength 5/5 in upper and lower extremities, sensation intact  Psych: Normal affect, not depressed   Skin: No rashes or lesions noted    Meds:                                                                                                       Medications were reviewed in the electronic record: [x]       Estimated Creatinine Clearance: 21 mL/min (A) (based on SCr of 1.32 mg/dL (H)).  Current Facility-Administered Medications   Medication Dose Route Frequency   . albuterol  2.5 mg Nebulization Q4H SCH   . amiodarone  100 mg Oral Daily   . aspirin EC  81 mg Oral QAM   . dorzolamide  1 drop Left Eye TID   . famotidine  10 mg Oral Daily   . folic acid  1 mg Oral Daily   . furosemide  20 mg Intravenous BID   . lactobacillus species  50 Billion CFU Oral Daily   . methylPREDNISolone  40 mg Intravenous Q8H   . metoprolol succinate XL  50 mg Oral QAM   . piperacillin-tazobactam  2.25 g Intravenous Q8H   . potassium chloride  20 mEq Oral QAM   . pravastatin  40 mg Oral QAM   . sodium chloride (PF)  3 mL Intravenous Q8H   . vancomycin therapy placeholder   Does not apply See Admin Instructions   . warfarin therapy placeholder  1 each Does not apply See Admin Instructions     PRN medications: acetaminophen **OR** acetaminophen **OR** acetaminophen, albuterol, naloxone, ondansetron **OR** ondansetron, prochlorperazine  IV Drips:       Labs and Imaging:                                                                                 Results     Procedure Component  Value Units  Date/Time    Prothrombin time/INR [[id number]] Collected:  01/04/19 0558    Specimen:  Blood Updated:  01/04/19 0624    Blood Culture [[id number]] Collected:  01/02/19 1300    Specimen:  Blood from Venipuncture Updated:  01/04/19 0502    Narrative:       Specimen/Source: Blood/Venipuncture  Collected: 01/02/2019 13:00     Status: Valued      Last Updated: 01/04/2019 05:00                Culture Result (Prelim)      No Growth To Date          Blood Culture [[id number]] Collected:  01/02/19 1300    Specimen:  Blood from Venipuncture Updated:  01/04/19 0502    Narrative:       Specimen/Source: Blood/Venipuncture  Collected: 01/02/2019 13:00     Status: Valued      Last Updated: 01/04/2019 05:00                Culture Result (Prelim)      No Growth To Date          Nares MRSA Detection by DNA Amplification [[id number]] Collected:  01/04/19 0224    Specimen:  Nares Updated:  01/04/19 0415     MRSA No MRSA detected.    i-Stat G3 Arterial CartrIDge [[id number]]  (Abnormal) Collected:  01/03/19 1625    Specimen:  Arterial Updated:  01/03/19 1636     pH, ISTAT 7.49     PO2, ISTAT 59 mm Hg      BE, ISTAT 7 mMol/L      HCO3, ISTAT 30.4 mMol/L      PCO2, ISTAT 40.0 mm Hg      O2 Sat, %, ISTAT 92 %      Room Number, ISTAT 528     i-STAT Allen's Test N/A     DELS, ISTAT HFNC     i-STAT FIO2 80.00 %      i-STAT Liters Per Minute 50 L/min      Sample, ISTAT Arterial     Site, ISTAT R Brachial     TCO2, ISTAT 32 mMol/L      Operator, ISTAT Operator: [id number] DUFFEE MARK RT    CBC and differential [[id number]]  (Abnormal) Collected:  01/03/19 0443    Specimen:  Blood Updated:  01/03/19 0624     WBC 13.9 K/cmm      RBC 3.27 M/cmm      Hemoglobin 10.3 gm/dL      Hematocrit 84.1 %      MCV 100 fL      MCH 31 pg      MCHC 31 gm/dL      RDW 66.0 %      PLT CT 286 K/cmm      MPV 7.2 fL      NEUTROPHIL % 89.0 %      Lymphocytes 4.0 %      Monocytes 3.0 %      Eosinophils % 0.0 %      Basophils % 0.0 %      Bands 4 %      Neutrophils  Absolute 13.3 K/cmm      Lymphocytes Absolute 0.6 K/cmm      Monocytes Absolute 0.0 K/cmm      Eosinophils Absolute 0.0 K/cmm      BASO Absolute 0.0 K/cmm      RBC Morphology RBC Morphology Reviewed     Macrocytic 1+  Anisocytosis 1+     Polychromasia 1+    Narrative:       Manual differential performed          Microbiology, reviewed and are significant for:  Microbiology Results     Procedure Component Value Units Date/Time    Blood Culture [[id number]] Collected:  01/02/19 1300    Specimen:  Blood from Venipuncture Updated:  01/04/19 0502    Narrative:       Specimen/Source: Blood/Venipuncture  Collected: 01/02/2019 13:00     Status: Valued      Last Updated: 01/04/2019 05:00                Culture Result (Prelim)      No Growth To Date          Blood Culture [[id number]] Collected:  01/02/19 1300    Specimen:  Blood from Venipuncture Updated:  01/04/19 0502    Narrative:       Specimen/Source: Blood/Venipuncture  Collected: 01/02/2019 13:00     Status: Valued      Last Updated: 01/04/2019 05:00                Culture Result (Prelim)      No Growth To Date          Influenza A / B Rapid Test [[id number]] Collected:  12/31/18 1108    Specimen:  Nasal Wash Updated:  12/31/18 1143     Influenza A Negative     Influenza B Negative     Comment: Method: Jarvis Morgan    The sensitivity for this method is between 90% and 95% for Influenza A and around 90% for Influenza B. The specificity for both Influenza A and B is around 96%. False positive results may occur, especially when the prevalence of Influenza activity is low. This is more likely with Influenza B due to its lower prevalence. Clinical conditions, including the prevalence of influenza activity, should be considered in the interpretation of results. If clinically indicated, results may be confirmed with PCR testing.  The above 2 analytes were performed by [facility] Main [facility] [phone])  [address] [address]         Narrative:        Influenza A antigen detection tests are unable to distinquish between novel and seasonal influenza A.    A negative result for either Influenza A or B antigen does not exclude influenza virus infection. Clinical correlation required.    All positive influenza antigen tests (A or B) require placement of patient on droplet precaution isolation.    Nares MRSA Detection by DNA Amplification [[id number]] Collected:  01/04/19 0224    Specimen:  Nares Updated:  01/04/19 0415     MRSA No MRSA detected.     Comment: The above 1 analytes were performed by [facility] Main [facility] [phone])  [address],[place] [address]               Imaging, reviewed and are significant for:  Radiology Results (24 Hour)     ** No results found for the last 24 hours. **              Signed:  Darral Dash, MD   SVRP R1, [phone]

## 2019-01-04 NOTE — Progress Notes (Signed)
PROGRESS NOTE    Date Time: 01/04/19 1:18 PM  Patient Name: Kelli Brown, Kelli Brown  MRN:  [id number]    Assessment/Plan:   Active Problems:    Pneumonia    Acute exacerbation of CHF (congestive heart failure)    Acute respiratory failure with hypoxia    Hypertension    Chronic kidney disease, stage III (moderate)    Hyperlipidemia    1.  Hypoxia-severe and persistent.  She has acute hypoxic respiratory failure-minimal improvement seen.  Likely related to pulmonary hypertension and the interstitial changes of her lungs.  2.  Pulmonary hypertension longstanding based upon echoes.  Primary treatment is diuresis.  3.  Interstitial lung disease-could consider increasing Solu-Medrol to 1 g/day but unclear how much that would help.  Not a great surgical candidate because of age and debility.  Not really sure what would do differently based upon the information.  At this point I think it is treat with steroids, antibiotics and continue diuresis.      Subjective:   Says she feels okay.  Says she feels weak.  Complains she is tired of being n.p.o.  The following portions of the patient's history were reviewed and updated as appropriate: allergies and current medications.    Physical Exam:     Vitals:    01/04/19 1306   BP: 122/55   Pulse: 93   Resp: 20   Temp: 98.4 F (36.9 C)   SpO2: 91%     Focused Exam:  General appearance - oriented to person, place, and time and chronically ill appearing  Mental status - alert, oriented to person, place, and time  Eyes - pupils equal and reactive, extraocular eye movements intact, sclera anicteric  Mouth -   Neck - supple, no significant adenopathy  Lymphatics -   Chest -mild tachypnea.  Breath sounds are diminished.  Heart - normal rate, regular rhythm, normal S1, S2, no murmurs, rubs, clicks or gallops  Abdomen - soft, nontender, nondistended, no masses or organomegaly  Neurological - alert, oriented, normal speech, no focal findings or movement disorder noted, neck supple without  rigidity, cranial nerves II through XII intact  Musculoskeletal -   Extremities - peripheral pulses normal, no pedal edema, no clubbing or cyanosis  Skin -     Labs:     Results     Procedure Component Value Units Date/Time    CBC and differential [[id number]]  (Abnormal) Collected:  01/04/19 0629    Specimen:  Blood Updated:  01/04/19 0814     WBC 16.5 K/cmm      RBC 3.11 M/cmm      Hemoglobin 9.9 gm/dL      Hematocrit 47.8 %      MCV 100 fL      MCH 32 pg      MCHC 32 gm/dL      RDW 29.5 %      PLT CT 271 K/cmm      MPV 7.1 fL      NEUTROPHIL % 89.0 %      Lymphocytes 4.0 %      Monocytes 6.0 %      Eosinophils % 0.0 %      Basophils % 0.0 %      Bands 1 %      Neutrophils Absolute 14.9 K/cmm      Lymphocytes Absolute 0.7 K/cmm      Monocytes Absolute 1.0 K/cmm      Eosinophils Absolute 0.0 K/cmm  BASO Absolute 0.0 K/cmm      RBC Morphology RBC Morphology Reviewed     Polychromasia 1+     Hypochromia 1+     Elliptocytes 1+    Narrative:       Manual differential performed    Comprehensive metabolic panel [[id number]]  (Abnormal) Collected:  01/04/19 0629    Specimen:  Plasma Updated:  01/04/19 0738     Sodium 142 mMol/L      Potassium 3.7 mMol/L      Chloride 104 mMol/L      CO2 24 mMol/L      Calcium 8.9 mg/dL      Glucose 782 mg/dL      Creatinine 9.56 mg/dL      BUN 39 mg/dL      Protein, Total 6.3 gm/dL      Albumin 2.6 gm/dL      Alkaline Phosphatase 96 U/L      ALT 18 U/L      AST (SGOT) 23 U/L      Bilirubin, Total 0.9 mg/dL      Albumin/Globulin Ratio 0.70 Ratio      Anion Gap 17.7 mMol/L      BUN/Creatinine Ratio 27.1 Ratio      EGFR 30 mL/min/1.35m2      Osmolality Calculated 297 mOsm/kg      Globulin 3.7 gm/dL     Prothrombin time/INR [[id number]]  (Abnormal) Collected:  01/04/19 0558    Specimen:  Blood Updated:  01/04/19 0650     PT 21.7 sec      PT INR 2.1    Blood Culture [[id number]] Collected:  01/02/19 1300    Specimen:  Blood from Venipuncture Updated:  01/04/19 0502    Narrative:        Specimen/Source: Blood/Venipuncture  Collected: 01/02/2019 13:00     Status: Valued      Last Updated: 01/04/2019 05:00                Culture Result (Prelim)      No Growth To Date          Blood Culture [[id number]] Collected:  01/02/19 1300    Specimen:  Blood from Venipuncture Updated:  01/04/19 0502    Narrative:       Specimen/Source: Blood/Venipuncture  Collected: 01/02/2019 13:00     Status: Valued      Last Updated: 01/04/2019 05:00                Culture Result (Prelim)      No Growth To Date          Nares MRSA Detection by DNA Amplification [[id number]] Collected:  01/04/19 0224    Specimen:  Nares Updated:  01/04/19 0415     MRSA No MRSA detected.    i-Stat G3 Arterial CartrIDge [[id number]]  (Abnormal) Collected:  01/03/19 1625    Specimen:  Arterial Updated:  01/03/19 1636     pH, ISTAT 7.49     PO2, ISTAT 59 mm Hg      BE, ISTAT 7 mMol/L      HCO3, ISTAT 30.4 mMol/L      PCO2, ISTAT 40.0 mm Hg      O2 Sat, %, ISTAT 92 %      Room Number, ISTAT 528     i-STAT Allen's Test N/A     DELS, ISTAT HFNC     i-STAT FIO2 80.00 %      i-STAT Liters Per Minute 50 L/min  Sample, ISTAT Arterial     Site, ISTAT R Brachial     TCO2, ISTAT 32 mMol/L      Operator, ISTAT Operator: [id number] DUFFEE MARK RT        Recent Labs   Lab 01/04/19  0629 01/03/19  0443 01/02/19  0512   Eosinophils Absolute 0.0 0.0 0.1       Rads:     Radiology Results (24 Hour)     Procedure Component Value Units Date/Time    XR Chest AP Portable [[id number]] Collected:  01/04/19 0743    Order Status:  Completed Updated:  01/04/19 0750    Narrative:       Clinical History:  persistent hypoxemia    Examination:  XR CHEST AP PORTABLE    Comparison:  Recent exams    Technique:  Portable AP    Findings:  Heart size unchanged.  Persistent diffuse airspace disease.  Small effusions.  No pneumothorax.  No acute bony abnormality.      Impression:       1.  Persistent diffuse airspace disease.  2.  Appearance on CT worrisome for interstitial lung disease.     ReadingStation:[id number]              Signed by: Teresita Madura, MD  Service:  @SERVICE @  Time/Date:  1:18 PM2/10/2019

## 2019-01-04 NOTE — SLP Plan of Care Note (Signed)
[facility]  Department of Rehabilitation Services      Speech Therapy General Note    Pt Name: Kelli Brown CSN: [id number]    Time: 10:30 AM    Attempted to see pt for SLP tx. The pt remains on 50L 80% HFNC and has been unable to be weaned. Recommend continuation of NPO until respiratory status improves to 40L or less. O2 demands above 40L is associated with increased aspiration risk. Pt may have ice chips and small sips of water with supervision. Spoke with Dr. Ladoris Gene yesterday re: pt's NPO status; she felt that the pt's anxiety re: being NPO is contributing to pt's impaired respiratory status and inability to be weaned. Will defer to MD if she thinks implementing a PO diet will assist with improvement in pt's medical status. I do not suspect the pt is a significant aspiration risk in general, and she was on a regular diet/thin liquids prior to impaired respiratory status. Will f/u for diet tolerance once pt able to be put back on a PO diet.     Team Communication: Richardson Landry, RN    Plan: Will follow up once O2 demands improve for SLP tx.        Herminio Heads, M.S., CCC-SLP  Speech-Language Pathologist  [facility], [facility] via [place], [place] Phone [phone], Pager [phone]

## 2019-01-05 LAB — BASIC METABOLIC PANEL
Anion Gap: 15.8 mMol/L (ref 7.0–18.0)
BUN / Creatinine Ratio: 31.1 Ratio — ABNORMAL HIGH (ref 10.0–30.0)
BUN: 46 mg/dL — ABNORMAL HIGH (ref 7–22)
CO2: 28 mMol/L (ref 20–30)
Calcium: 8.8 mg/dL (ref 8.5–10.5)
Chloride: 103 mMol/L (ref 98–110)
Creatinine: 1.48 mg/dL — ABNORMAL HIGH (ref 0.60–1.20)
EGFR: 29 mL/min/{1.73_m2} — ABNORMAL LOW (ref 60–150)
Glucose: 227 mg/dL — ABNORMAL HIGH (ref 71–99)
Osmolality Calculated: 304 mOsm/kg — ABNORMAL HIGH (ref 275–300)
Potassium: 3.8 mMol/L (ref 3.5–5.3)
Sodium: 143 mMol/L (ref 136–147)

## 2019-01-05 LAB — B-TYPE NATRIURETIC PEPTIDE: B-Natriuretic Peptide: 143.6 pg/mL — ABNORMAL HIGH (ref 0.0–100.0)

## 2019-01-05 LAB — PT/INR
PT INR: 3.6 — ABNORMAL HIGH (ref 0.5–1.3)
PT: 35.7 s — ABNORMAL HIGH (ref 9.5–11.5)

## 2019-01-05 MED ORDER — LORATADINE 10 MG PO TABS
10.0000 mg | ORAL_TABLET | Freq: Every day | ORAL | Status: DC
Start: 2019-01-05 — End: 2019-01-18
  Administered 2019-01-05 – 2019-01-18 (×14): 10 mg via ORAL
  Filled 2019-01-05 (×14): qty 1

## 2019-01-05 MED ORDER — GUAIFENESIN ER 600 MG PO TB12
600.00 mg | ORAL_TABLET | Freq: Two times a day (BID) | ORAL | Status: DC
Start: 2019-01-05 — End: 2019-01-18
  Administered 2019-01-05 – 2019-01-18 (×27): 600 mg via ORAL
  Filled 2019-01-05 (×28): qty 1

## 2019-01-05 NOTE — Progress Notes (Signed)
Called for a patient on HFNC 40 liters at 100% with O2 sats between 88-91%.  She appears to have mild dyspnea although with speaking with the patient she denies being SOB.  She is also asking for food and something to drink.  Lung sounds are diminished.  She is due for solumedrol now, Dr Elpidio Eric was recently at the bedside.  Dr Darin Engels was called and updated about patient condition.  ? palliative care consult.  Will follow along with the primary team as needed.

## 2019-01-05 NOTE — SLP Progress Note (Signed)
[facility]  Speech Language Pathology   Daily Progress Note    Patient: Kelli Brown    CSN#: [id number]    ASSESSMENT/PLAN/RECOMMENDATIONS:     SLP diagnosis:  Mild Oropharyngeal Dysphagia    Diet recommendations:  Regular diet, Thin liquids (single sips), Meds whole in applesauce/pureed    Projected Discharge Recommendations:  No further SLP needs    Prognosis:  Good     Continue therapy for:     Discontinue therapy D/C; No further SLP needs at this time     Goals: Pt will:  STG: (In 3-4 days)  1. Pt will tolerate aREGULARdiet with THINliquids free of signs/symptoms of aspiration. (MET)  2. Pt will use compensatory swallow strategies ofTake small bites/sips, Eat/feed slowlyduring meals Independently (MET)  3. Pt/family/staff education (MET)    LTG: (By discharge)  1. Pt will tolerate the least restrictive diet free of signs/symptoms of aspiration. (MET)      SUBJECTIVE:      Subjective: Patient is agreeable to participation in the therapy session. Nursing clears patient for therapy. Patient's medical condition is appropriate for Speech therapy intervention at this time.    S:  "I am so hungry."      OBJECTIVE:     O: SLP Treatment:  Pt was seen for SLP tx with progress as follows:      The pt was seen today for re-evaluation of swallowing in light of improved O2 demands. The pt is now on 30L HFNC, improved from 50L. The pt has been NPO d/t impaired respiratory status for several days. Now with improved respiratory status, pt was given trials of thin liquid by straw x14 and bites of regular solid (graham cracker) x5. There were no signs/symptoms of aspiration, functional mastication, no vocal change, no change in respiratory status. Recommend implementation of regular diet. No further SLP needs.      Pt/Family/Caregiver Education Provided:     Patient was/were educated re: role of slp, purpose of evaluation, sign/symptoms/risks of aspiration, results/recommendations of today's evaluation, tx  goals, d/c recommendations  Good understanding was verbalized/demonstrated: yes  Comprehension limited by: None    Team Communication:     Spoke to : RN/LPN Selena Batten; Physician - Dr. Darin Engels  Regarding: results/recommendations of today's evaluation, d/c recommendations  Good understanding was verbalized: yes                          Time of treatment:   SLP Received On: 01/05/19  Start Time: 0913  Stop Time: 0921  Time Calculation (min): 8 min    SLP Visit Number: 4      Completed by:  Herminio Heads, M.S., CCC-SLP  Speech-Language Pathologist  [facility], [facility] via [place], [place] Phone [phone], Pager [phone]

## 2019-01-05 NOTE — Progress Note - Problem Oriented Charting Notewrit (Signed)
Pharmacy Consult Warfarin Dosing  Kelli Brown    Age: 85 y.o.  Weight: 66.8 kg (147 lb 4.8 oz)  Indication: a fib  Goal INR: 2-3  Home warfarin dose =   2.5 mg daily except 5mg  Thurs and Sat  Interacting agent/disease: amiodarone, aspirin, zosyn    Subjective/Objective:   89 yof admitted from assisted living with hypoxia and lethargy. Takes warfarin as outpatient reportedly at above dosage, however it is not on patient's home med list, per facility home med list is not regularly updated    Assessment/Plan:   Vitamin K 5 mg po on 2/9      INR today 3.6 - uptrend continuing despite dose hold last night   Hold warfarin again tonight.    Daily INR   Monitor for signs and symptoms of bleeding   Avoid IM injections      Current warfarin dose =(hold for INR greater than 3.5)  Date 2/8 2/9 2/10 2/11 2/12 2/13      INR 8.8 9.7 1.5 1.4 2.1 3.6      dose Hold hold 5 mg 5 mg Hold       Hgb 11.1 10.3 9.8 10.3 9.9       Plt 259 [phone] 271           Past Medical History:   Diagnosis Date    Abnormal vision     Arthritis     Atrial fibrillation     Congestive heart failure     Glaucoma     Hip fx, right, closed, initial encounter 2010    Hyperlipidemia     Hypertension     Low back pain     Macular degeneration     Nonrheumatic aortic (valve) stenosis 08/11/2017    Shingles         Recent Labs   Lab 01/04/19  0629 01/01/19  0653   Bilirubin, Total 0.9 0.7   Bilirubin, Direct  --  0.3   Protein, Total 6.3 6.1   Albumin 2.6* 2.7*   ALT 18 9   AST (SGOT) 23 20             If you have any questions, please contact the pharmacist at [phone].    [name], PharmD

## 2019-01-05 NOTE — Progress Notes (Signed)
Medicine Progress Note - [facility]      Date Time: 01/05/19 6:20 AM  Patient Name: Kelli Brown  Attending Physician: Eben Burow, MD    Assessment:                                                                                        Active Problems:    Pneumonia    Acute exacerbation of CHF (congestive heart failure)    Acute respiratory failure with hypoxia    Hypertension    Chronic kidney disease, stage III (moderate)    Hyperlipidemia        Plan:                                                                                                      Acute hypoxic respiratory failurelikelydue to pneumonia/diastolic heart failure  Continues to require HFNC50L/min FiO2 of 75%, unable to wean  Dr. Jacqlyn Larsen (pulm) consulted, agrees with current management   Maintain oxygen saturation>90%    Pneumonia/ Pneumonitis  CXR/CT: consistent with multifocal pneumonia/pneumonitis  Repeat CXR: consistent with previous studies  MRSA nares neg, will discontinue vanc  Continue Zosyn 2.25 g q8h  Blood cx NGTD  Solumederol 40 mg q8h  Increased sputum/post nasal drip will add mucinex and claritin    Acute on chronic diastolic heart failure/moderate aortic stenosis  Switched to oral lasix 20 mg BID  Strict I's/O, daily weights  Echo: LVH, stable aortic stenosis, moderate tricuspid regurg, moderate to severe pulmonary artery pressure    CKD stage III  Creatinine 1.44  Baseline: 1.3  Monitor while on lasix    Mild anemia due to CKD  Monitor h/H    Paroxysmal atrial fibrillation  Amiodarone100mg    Metoprolol 50mg   Pharmacy to dosewarfarin    Hypertension controlled  Metoprolol50mg     Hyperlipidemia  Statin40mg     Supra therapeutic INR, resolved  Pharmacy to dose warfarin    DVT PPx: Warfarin  Lines:                                                                                                       Patient Lines/Drains/Airways Status    Active PICC Line / CVC Line /  PIV Line / Drain / Airway / Intraosseous Line / Epidural  Line / ART Line / Line / Wound / Pressure Ulcer / NG/OG Tube     Name:   Placement date:   Placement time:   Site:   Days:    Peripheral IV 01/01/19 Right Forearm   01/01/19    0030    Forearm   4                Disposition:                                                                                             Today's date: 01/05/2019  Length of Stay: 5  Code Status: NO CPR - SUPPORT OK  Anticipated medical stability for discharge in : 3 days    Subjective                                                                                             CC: <principal problem not specified>     Patient states she feels like she is improving and she is less short of breath. She wants to get out of bed. Denies chest pain, swelling, N/V/D.      Review of Systems:                                                                               Review of Systems   Constitutional: Negative for chills and fever.   HENT: Negative for congestion and sore throat.    Respiratory: Positive for cough and shortness of breath.    Cardiovascular: Negative for chest pain and leg swelling.   Gastrointestinal: Negative for abdominal pain, diarrhea, nausea and vomiting.   Genitourinary: Negative for dysuria, frequency and urgency.   Musculoskeletal: Negative for arthralgias and myalgias.   Skin: Negative for pallor and rash.   Neurological: Positive for weakness. Negative for dizziness and headaches.       Physical Exam:                                                                                     Temp:  [96.8 F (36 C)-98.4 F (36.9 C)] 97.7 F (36.5  C)  Heart Rate:  [79-101] 84  Resp Rate:  [16-24] 20  BP: (100-124)/(44-55) 102/45  FiO2:  [70 %-80 %] 75 %    Intake/Output Summary (Last 24 hours) at 01/05/2019 1610  Last data filed at 01/05/2019 0500  Gross per 24 hour   Intake 220 ml   Output 875 ml   Net -655 ml       General: Awake, alert, oriented x 3; No acute distress.   HEENT: EOMI, Sclera anicteric  Oropharynx clear without lesions, mucous membranes moist  Glands: No cervical or axillary lymphadenopathy.  Neck: Supple, no lymphadenopathy, no thyromegaly  Cardiovascular: RRR, no murmurs, rubs or gallops  Lungs: breath sounds are clear without wheezes rales or rhonchi  Abdomen: Soft, non-tender, non-distended; no palpable masses, normoactive bowel sounds, no rebound or guarding  Extremities: No clubbing, cyanosis, or edema  Neuro: Cranial nerves grossly intact, strength 5/5 in upper and lower extremities, sensation intact  Psych: Normal affect, not depressed   Skin: No rashes or lesions noted    Meds:                                                                                                       Medications were reviewed in the electronic record: [x]       Estimated Creatinine Clearance: 19 mL/min (A) (based on SCr of 1.44 mg/dL (H)).  Current Facility-Administered Medications   Medication Dose Route Frequency   . albuterol  2.5 mg Nebulization Q4H SCH   . amiodarone  100 mg Oral Daily   . aspirin EC  81 mg Oral QAM   . dorzolamide  1 drop Left Eye TID   . famotidine  10 mg Oral Daily   . folic acid  1 mg Oral Daily   . furosemide  20 mg Oral BID   . lactobacillus species  50 Billion CFU Oral Daily   . methylPREDNISolone  40 mg Intravenous Q8H   . metoprolol succinate XL  50 mg Oral QAM   . piperacillin-tazobactam  2.25 g Intravenous Q8H   . potassium chloride  20 mEq Oral QAM   . pravastatin  40 mg Oral QAM   . sodium chloride (PF)  3 mL Intravenous Q8H   . warfarin therapy placeholder  1 each Does not apply See Admin Instructions     PRN medications: acetaminophen **OR** acetaminophen **OR** acetaminophen, albuterol, LORazepam, naloxone, ondansetron **OR** ondansetron, prochlorperazine  IV Drips:       Labs and Imaging:                                                                                 Results     Procedure Component Value Units Date/Time    Prothrombin time/INR  [[id number]] Collected:  01/05/19  6578    Specimen:  Blood Updated:  01/05/19 0616    CBC and differential [[id number]]  (Abnormal) Collected:  01/04/19 0629    Specimen:  Blood Updated:  01/04/19 0814     WBC 16.5 K/cmm      RBC 3.11 M/cmm      Hemoglobin 9.9 gm/dL      Hematocrit 41.3 %      MCV 100 fL      MCH 32 pg      MCHC 32 gm/dL      RDW 24.4 %      PLT CT 271 K/cmm      MPV 7.1 fL      NEUTROPHIL % 89.0 %      Lymphocytes 4.0 %      Monocytes 6.0 %      Eosinophils % 0.0 %      Basophils % 0.0 %      Bands 1 %      Neutrophils Absolute 14.9 K/cmm      Lymphocytes Absolute 0.7 K/cmm      Monocytes Absolute 1.0 K/cmm      Eosinophils Absolute 0.0 K/cmm      BASO Absolute 0.0 K/cmm      RBC Morphology RBC Morphology Reviewed     Polychromasia 1+     Hypochromia 1+     Elliptocytes 1+    Narrative:       Manual differential performed    Comprehensive metabolic panel [[id number]]  (Abnormal) Collected:  01/04/19 0629    Specimen:  Plasma Updated:  01/04/19 0738     Sodium 142 mMol/L      Potassium 3.7 mMol/L      Chloride 104 mMol/L      CO2 24 mMol/L      Calcium 8.9 mg/dL      Glucose 644 mg/dL      Creatinine 0.34 mg/dL      BUN 39 mg/dL      Protein, Total 6.3 gm/dL      Albumin 2.6 gm/dL      Alkaline Phosphatase 96 U/L      ALT 18 U/L      AST (SGOT) 23 U/L      Bilirubin, Total 0.9 mg/dL      Albumin/Globulin Ratio 0.70 Ratio      Anion Gap 17.7 mMol/L      BUN/Creatinine Ratio 27.1 Ratio      EGFR 30 mL/min/1.21m2      Osmolality Calculated 297 mOsm/kg      Globulin 3.7 gm/dL     Prothrombin time/INR [[id number]]  (Abnormal) Collected:  01/04/19 0558    Specimen:  Blood Updated:  01/04/19 0650     PT 21.7 sec      PT INR 2.1          Microbiology, reviewed and are significant for:  Microbiology Results     Procedure Component Value Units Date/Time    Blood Culture [[id number]] Collected:  01/02/19 1300    Specimen:  Blood from Venipuncture Updated:  01/04/19 0502    Narrative:       Specimen/Source:  Blood/Venipuncture  Collected: 01/02/2019 13:00     Status: Valued      Last Updated: 01/04/2019 05:00                Culture Result (Prelim)      No Growth To Date          Blood Culture [[id number]] Collected:  01/02/19 1300    Specimen:  Blood from Venipuncture Updated:  01/04/19 0502    Narrative:       Specimen/Source: Blood/Venipuncture  Collected: 01/02/2019 13:00     Status: Valued      Last Updated: 01/04/2019 05:00                Culture Result (Prelim)      No Growth To Date          Influenza A / B Rapid Test [[id number]] Collected:  12/31/18 1108    Specimen:  Nasal Wash Updated:  12/31/18 1143     Influenza A Negative     Influenza B Negative     Comment: Method: Jarvis Morgan    The sensitivity for this method is between 90% and 95% for Influenza A and around 90% for Influenza B. The specificity for both Influenza A and B is around 96%. False positive results may occur, especially when the prevalence of Influenza activity is low. This is more likely with Influenza B due to its lower prevalence. Clinical conditions, including the prevalence of influenza activity, should be considered in the interpretation of results. If clinically indicated, results may be confirmed with PCR testing.  The above 2 analytes were performed by [facility] [phone])  [address] [address]         Narrative:       Influenza A antigen detection tests are unable to distinquish between novel and seasonal influenza A.    A negative result for either Influenza A or B antigen does not exclude influenza virus infection. Clinical correlation required.    All positive influenza antigen tests (A or B) require placement of patient on droplet precaution isolation.    Nares MRSA Detection by DNA Amplification [[id number]] Collected:  01/04/19 0224    Specimen:  Nares Updated:  01/04/19 0415     MRSA No MRSA detected.     Comment: The above 1 analytes were performed by [facility]  [phone])  [address],[place] [address]               Imaging, reviewed and are significant for:  Radiology Results (24 Hour)     Procedure Component Value Units Date/Time    XR Chest AP Portable [[id number]] Collected:  01/04/19 0743    Order Status:  Completed Updated:  01/04/19 0750    Narrative:       Clinical History:  persistent hypoxemia    Examination:  XR CHEST AP PORTABLE    Comparison:  Recent exams    Technique:  Portable AP    Findings:  Heart size unchanged.  Persistent diffuse airspace disease.  Small effusions.  No pneumothorax.  No acute bony abnormality.      Impression:       1.  Persistent diffuse airspace disease.  2.  Appearance on CT worrisome for interstitial lung disease.    ReadingStation:[id number]            Signed:  Darral Dash, MD   SVRP R1, [phone]

## 2019-01-05 NOTE — Plan of Care (Addendum)
NURSE NOTE SUMMARY  [facility] - GI/ENDO/GEN MED   Patient Name: Kelli Brown   Attending Physician: Alvino Blood, MD   Today's date:   01/05/2019 LOS: 5 days   Shift Summary:                                                              Assumed care of pt 0700. Pt denies pain, and shows no signs or symptoms of distress. Call bell within reach, bed alarm on and audible. Pt denies needs at this time; will continue to monitor.  Pt on continuous pulse ox; tolerating high flow O2 well.  1420 Pt's brother complaining of pt being in pain. This RN went into room to investigate. Pt had removed high flow nasal cannula mask, and was complaining of chest pain. O2 sat was 60%. O2 replaced onto patient, O2 recovered to 92%. Pt found to be diaphoretic. Vital signs obtained, VSS. Chest pain does not radiate to back, shoulders, or anywhere. Chest pain resolved with repositioning. Pt also has flushed cheeks. Extra blankets removed from pt, sheet placed back on.    Provider Notifications:   MD made aware of situation with pt. MD advised to keep administering the steroid, and keep and eye on pt's skin in case of contact dermatitis from HFNC.   Rapid Response Notifications:  Mobility:      PMP Activity: Step 4 - Dangle at Bedside (01/05/2019  8:23 AM)     Weight tracking:  Family Dynamic:   Last 3 Weights for the past 72 hrs (Last 3 readings):   Weight   01/05/19 0331 66.8 kg (147 lb 4.8 oz)   01/04/19 0450 67.9 kg (149 lb 12.8 oz)             Recent Vitals Last Bowel Movement   BP: 107/44 (01/05/2019 11:18 AM)  Heart Rate: 93 (01/05/2019 11:18 AM)  Temp: (!) 96.1 F (35.6 C) (01/05/2019 11:18 AM)  Resp Rate: 16 (01/05/2019 11:18 AM)  Weight: 66.8 kg (147 lb 4.8 oz) (01/05/2019  3:31 AM)  SpO2: 92 % (01/05/2019 12:13 PM)   No data recorded           Problem: Moderate/High Fall Risk Score >5  Goal: Patient will remain free of falls  Outcome: Progressing  Flowsheets (Taken 01/05/2019 0823)  VH High Risk (Greater than  13): ALL REQUIRED LOW INTERVENTIONS;ALL REQUIRED MODERATE INTERVENTIONS;RED "HIGH FALL RISK" SIGNAGE;BED ALARM WILL BE ACTIVATED WHEN THE PATEINT IS IN BED WITH SIGNAGE "RESET BED ALARM";A CHAIR PAD ALARM WILL BE USED WHEN PATIENT IS UP SITTING IN A CHAIR;PATIENT IS TO BE SUPERVISED FOR ALL TOILETING ACTIVITIES;Use assistive devices;Use chair-pad alarm device;Use of "STOP ask for help" sign     Problem: Compromised Tissue integrity  Goal: Damaged tissue is healing and protected  Outcome: Progressing  Flowsheets (Taken 01/05/2019 1328)  Damaged tissue is healing and protected : Monitor/assess Braden scale every shift; Reposition patient every 2 hours and as needed unless able to reposition self; Increase activity as tolerated/progressive mobility; Relieve pressure to bony prominences for patients at moderate and high risk; Avoid shearing injuries; Keep intact skin clean and dry; Use incontinence wipes for cleaning urine, stool and caustic drainage. Foley care as needed; Monitor external devices/tubes for correct placement to prevent pressure,  friction and shearing; Monitor patient's hygiene practices     Problem: Inadequate Gas Exchange  Goal: Adequate oxygenation and improved ventilation  Outcome: Progressing  Flowsheets (Taken 01/05/2019 1328)  Adequate oxygenation and improved ventilation: Assess lung sounds; Monitor SpO2 and treat as needed; Provide mechanical and oxygen support to facilitate gas exchange; Position for maximum ventilatory efficiency; Teach/reinforce use of incentive spirometer 10 times per hour while awake, cough and deep breath as needed; Plan activities to conserve energy: plan rest periods; Increase activity as tolerated/progressive mobility; Consult/collaborate with Respiratory Therapy

## 2019-01-05 NOTE — Respiratory Progress Note (Signed)
RESPIRATORY SERVICES CARE PLAN   Patient Name: [facility] WHITE   Attending Physician: Eben Burow, MD   Today's date:    01/05/2019 LOS: 5 days   CURRENT THERAPY  WEANING ATTEMPT           OXYGEN THERAPY INCREASED REQUREMENTS THIS SHIFT   SpO2: 90 % (01/05/2019  3:13 AM)  O2 Device: HFNC (01/05/2019  3:13 AM)  FiO2: 75 % (01/05/2019  3:13 AM)  O2 Flow Rate (L/min): 50 L/min (01/05/2019  3:13 AM)                LAST ABG AIRWAY                     RECOMMENDATIONS/COMMENTS:                                                                  No weaning attempts were made this shift

## 2019-01-05 NOTE — Progress Notes (Signed)
PROGRESS NOTE    Date Time: 01/05/19 12:12 PM  Patient Name: Kelli Brown, Kelli Brown  MRN:  [id number]    Assessment/Plan:   Active Problems:    Pneumonia    Acute exacerbation of CHF (congestive heart failure)    Acute respiratory failure with hypoxia    Hypertension    Chronic kidney disease, stage III (moderate)    Hyperlipidemia    1.  Hypoxia-attributed to interstitial lung disease and pulmonary hypertension.  She is down to 40 L.  Patient says she is thirsty and hungry.  She is nothing by mouth because she has not had her swallow study.  That will not do a swallow study and summary on 50 L.  She is now down to 40 L  2.  Interstitial lung disease-no biopsy.  Not a bronchoscopy candidate.  Not a surgical candidate.  On modest dose steroids.  Could consider increasing the Cymetra 1 g per day but not sure there's great benefit.  3.  Infectious disease- Cultures negative.  On Zosyn.  Afebrile for 48 hours.  4.  Pulmonary hypertension-long-standing issue.  Seen on prior echoes.  Diuresis recommended while following BUN and creatinine.  Both of which have trended up.  This could be due to a pending cardiorenal syndrome, this could be due to dehydration/prerenal.    Overall I believe she has a poor prognosis.  Recommend palliative care consult    Subjective:   Complains that she is hungry and thirsty.  No fevers.  Says her breathing is okay  The following portions of the patient's history were reviewed and updated as appropriate: allergies and current medications.    Physical Exam:     Vitals:    01/05/19 1118   BP: 107/44   Pulse: 93   Resp: 16   Temp: (!) 96.1 F (35.6 C)   SpO2: 91%     Focused Exam:  General appearance - oriented to person, place, and time and acyanotic, in no respiratory distress  Mental status - affect appropriate to mood  Eyes - pupils equal and reactive, extraocular eye movements intact  Mouth - mucous membranes moist, pharynx normal without lesions  Neck - supple, no significant adenopathy   Lymphatics -   Chest - no tachypnea, retractions or cyanosis, decreased air entry noted Bilaterally  Heart - normal rate, regular rhythm, normal S1, S2, no murmurs, rubs, clicks or gallops  Abdomen - soft, nontender, nondistended, no masses or organomegaly  Neurological - alert, oriented, normal speech, no focal findings or movement disorder noted, neck supple without rigidity, cranial nerves II through XII intact  Musculoskeletal -   Extremities - peripheral pulses normal, no pedal edema, no clubbing or cyanosis  Skin -     Labs:     Results     Procedure Component Value Units Date/Time    Basic Metabolic Panel [[id number]]  (Abnormal) Collected:  01/05/19 1039    Specimen:  Plasma Updated:  01/05/19 1144     Sodium 143 mMol/L      Potassium 3.8 mMol/L      Chloride 103 mMol/L      CO2 28 mMol/L      Calcium 8.8 mg/dL      Glucose 563 mg/dL      Creatinine 8.75 mg/dL      BUN 46 mg/dL      Anion Gap 64.3 mMol/L      BUN/Creatinine Ratio 31.1 Ratio      EGFR 29 mL/min/1.1m2  Osmolality Calculated 304 mOsm/kg     B-type Natriuretic Peptide [[id number]]  (Abnormal) Collected:  01/05/19 1039    Specimen:  Blood Updated:  01/05/19 1138     B-Natriuretic Peptide 143.6 pg/mL     Prothrombin time/INR [[id number]]  (Abnormal) Collected:  01/05/19 0520    Specimen:  Blood Updated:  01/05/19 0645     PT 35.7 sec      PT INR 3.6        Recent Labs   Lab 01/04/19  0629 01/03/19  0443 01/02/19  0512   Eosinophils Absolute 0.0 0.0 0.1       Rads:     Radiology Results (24 Hour)     ** No results found for the last 24 hours. **              Signed by: Teresita Madura, MD  Service:  @SERVICE @  Time/Date:  12:12 PM2/13/2020

## 2019-01-05 NOTE — Respiratory Progress Note (Signed)
RESPIRATORY SERVICES CARE PLAN   Patient Name: [facility] WHITE   Attending Physician: Alvino Blood, MD   Today's date:    01/05/2019 LOS: 5 days   CURRENT THERAPY  WEANING ATTEMPT    High Flow    Patient is not tolerating weaning attempts this shift.  See reasoning below.   OXYGEN THERAPY INCREASED REQUREMENTS THIS SHIFT   SpO2: 93 % (01/05/2019  6:15 PM)  O2 Device: HFNC (01/05/2019  6:15 PM)  FiO2: 100 % (01/05/2019  6:15 PM)  O2 Flow Rate (L/min): (S) 45 L/min (01/05/2019  6:15 PM)       Patient has had an increase in FiO2 this shift.         LAST ABG AIRWAY                     RECOMMENDATIONS/COMMENTS:

## 2019-01-05 NOTE — Plan of Care (Addendum)
NURSE NOTE SUMMARY  [facility] - GI/ENDO/GEN MED   Patient Name: Kelli Brown   Attending Physician: Eben Burow, MD   Today's date:   01/05/2019 LOS: 5 days   Shift Summary:                                                              1900 Assumed care of pt. Assessed per flow sheets, medicated per MAR. Reviewed plan of care with pt. High-flow O2 on and continuous pulse-ox monitored. Tele intact and monitored. Pt denies any pain at this time. Call bell within reach and bed alarm on, pt is resting comfortably, will continue to monitor   Provider Notifications:      Rapid Response Notifications:  Mobility:      PMP Activity: Step 4 - Dangle at Bedside (01/04/2019 11:48 PM)     Weight tracking:  Family Dynamic:   Last 3 Weights for the past 72 hrs (Last 3 readings):   Weight   01/04/19 0450 67.9 kg (149 lb 12.8 oz)   01/02/19 0349 67.1 kg (147 lb 14.4 oz)             Recent Vitals Last Bowel Movement   BP: 124/50 (01/04/2019 10:46 PM)  Heart Rate: 92 (01/04/2019 10:46 PM)  Temp: 97 F (36.1 C) (01/04/2019 10:46 PM)  Resp Rate: (!) 24 (01/04/2019 10:46 PM)  Weight: 67.9 kg (149 lb 12.8 oz) (01/04/2019  4:50 AM)  SpO2: 92 % (01/04/2019 11:00 PM)   No data recorded     Problem: Moderate/High Fall Risk Score >5  Goal: Patient will remain free of falls  Outcome: Progressing  Flowsheets (Taken 01/04/2019 2348)  VH High Risk (Greater than 13): ALL REQUIRED LOW INTERVENTIONS;ALL REQUIRED MODERATE INTERVENTIONS;RED "HIGH FALL RISK" SIGNAGE;BED ALARM WILL BE ACTIVATED WHEN THE PATEINT IS IN BED WITH SIGNAGE "RESET BED ALARM";A CHAIR PAD ALARM WILL BE USED WHEN PATIENT IS UP SITTING IN A CHAIR;A safety companion may be used when deemed appropriate by the Primary RN and Clinical Administrator;Keep door open for better visibility;PATIENT IS TO BE SUPERVISED FOR ALL TOILETING ACTIVITIES;Include family/significant other in multidisciplinary discussion regarding plan of care as appropriate;Use assistive devices      Problem: Compromised Hemodynamic Status  Goal: Vital signs and fluid balance maintained/improved  Outcome: Progressing  Flowsheets (Taken 01/05/2019 0054)  Vital signs and fluid balance are maintained/improved: Position patient for maximum circulation/cardiac output; Monitor/assess vitals and hemodynamic parameters with position changes; Monitor intake and output. Notify LIP if urine output is less than 30 mL/hour.; Monitor/assess lab values and report abnormal values     Problem: Inadequate Gas Exchange  Goal: Adequate oxygenation and improved ventilation  Outcome: Progressing  Flowsheets (Taken 01/05/2019 0054)  Adequate oxygenation and improved ventilation: Assess lung sounds; Provide mechanical and oxygen support to facilitate gas exchange; Monitor SpO2 and treat as needed; Position for maximum ventilatory efficiency; Plan activities to conserve energy: plan rest periods; Increase activity as tolerated/progressive mobility; Consult/collaborate with Respiratory Therapy

## 2019-01-06 ENCOUNTER — Inpatient Hospital Stay: Payer: Medicare Other

## 2019-01-06 LAB — PT/INR
PT INR: 4.5 — ABNORMAL HIGH (ref 0.5–1.3)
PT: 44.4 s — ABNORMAL HIGH (ref 9.5–11.5)

## 2019-01-06 MED ORDER — CEROVITE ADVANCED FORMULA PO TABS
1.0000 | ORAL_TABLET | Freq: Every day | ORAL | Status: DC
Start: 2019-01-06 — End: 2019-01-18
  Administered 2019-01-06 – 2019-01-18 (×13): 1 via ORAL
  Filled 2019-01-06 (×13): qty 1

## 2019-01-06 NOTE — Plan of Care (Signed)
NURSE NOTE SUMMARY  [facility] - GI/ENDO/GEN MED   Patient Name: Kelli [facility] WHITE   Attending Physician: Alvino Blood, MD   Today's date:   01/06/2019 LOS: 6 days   Shift Summary:                                                              1900: Assumed care of pt. Pt resting in bed. Pt A&Ox4. Continuous pulse ox is on. Denies any needs at this time. Will continue to monitor.     2030: Pt yelling out. This RN entered the room to find the pt had removed her oxygen and continuous pulse ox. Both were replaced. O2 sats back up to 95%.     2230: Assessment complete. Medications administered per MAR. Pt resting in bed and denies any pain or further needs. Bed alarm is set and call bell within reach. Will continue to monitor.    Provider Notifications:      Rapid Response Notifications:  Mobility:      PMP Activity: Step 6 - Walks in Room (01/05/2019 10:30 PM)     Weight tracking:  Family Dynamic:   Last 3 Weights for the past 72 hrs (Last 3 readings):   Weight   01/05/19 0331 66.8 kg (147 lb 4.8 oz)   01/04/19 0450 67.9 kg (149 lb 12.8 oz)             Recent Vitals Last Bowel Movement   BP: 98/45 (01/06/2019 12:24 AM)  Heart Rate: 85 (01/06/2019 12:24 AM)  Temp: 97.3 F (36.3 C) (01/06/2019 12:24 AM)  Resp Rate: 18 (01/06/2019 12:24 AM)  Weight: 66.8 kg (147 lb 4.8 oz) (01/05/2019  3:31 AM)  SpO2: 97 % (01/06/2019 12:24 AM)   No data recorded           Problem: Moderate/High Fall Risk Score >5  Goal: Patient will remain free of falls  Outcome: Progressing  Flowsheets (Taken 01/05/2019 2230)  VH High Risk (Greater than 13): ALL REQUIRED LOW INTERVENTIONS;ALL REQUIRED MODERATE INTERVENTIONS;RED "HIGH FALL RISK" SIGNAGE;BED ALARM WILL BE ACTIVATED WHEN THE PATEINT IS IN BED WITH SIGNAGE "RESET BED ALARM";A CHAIR PAD ALARM WILL BE USED WHEN PATIENT IS UP SITTING IN A CHAIR;PATIENT IS TO BE SUPERVISED FOR ALL TOILETING ACTIVITIES;Keep door open for better visibility;Include family/significant other in  multidisciplinary discussion regarding plan of care as appropriate;Use assistive devices     Problem: Compromised Tissue integrity  Goal: Nutritional status is improving  Outcome: Progressing  Flowsheets (Taken 01/05/2019 1328 by Caryl Never, RN)  Nutritional status is improving: Allow adequate time for meals;Assist patient with eating     Problem: Compromised Hemodynamic Status  Goal: Vital signs and fluid balance maintained/improved  Outcome: Progressing  Flowsheets (Taken 01/05/2019 1328 by Caryl Never, RN)  Vital signs and fluid balance are maintained/improved: Position patient for maximum circulation/cardiac output;Monitor/assess vitals and hemodynamic parameters with position changes;Monitor intake and output. Notify LIP if urine output is less than 30 mL/hour.;Monitor/assess lab values and report abnormal values     Problem: Inadequate Gas Exchange  Goal: Adequate oxygenation and improved ventilation  Outcome: Progressing  Flowsheets (Taken 01/05/2019 1328 by Caryl Never, RN)  Adequate oxygenation and improved ventilation: Assess lung sounds;Monitor SpO2 and treat as needed;Provide mechanical and  oxygen support to facilitate gas exchange;Position for maximum ventilatory efficiency;Teach/reinforce use of incentive spirometer 10 times per hour while awake, cough and deep breath as needed;Plan activities to conserve energy: plan rest periods;Increase activity as tolerated/progressive mobility;Consult/collaborate with Respiratory Therapy

## 2019-01-06 NOTE — Progress Notes (Signed)
Nutrition Therapy  Nutrition Follow Up    Patient Information:     Name:Kelli Brown   Age: 85 y.o.   Sex: female     MRN: [id number]      Recommendation:     1. Thrive BID    Nutrition Assessment:     Admitted with respiratory failure/pneumonia, history of CHF. Seen by SLP yesterday and cleared for regular/thin Per I/O her intakes have been 75-100% of most trays (when not NPO).     Nutrition Focus Physical Exam: Normal    Nutrition Risk Level: Moderate      Nutrition Diagnosis:     Predicted Suboptimal Energy Intake -- improving.      Monitoring:  Evaluation:    PO/EN/PN intake:  Total energy intake and Liquid meal replacement, or supplement intake   Labs:  Electrolyte Profile, Renal Profile and Glucose, casual    GI Profile:  Bowel Function       Assessment Data:     Height: 1.524 m (5')   Weight: 67.3 kg (148 lb 5.9 oz)   BMI: Body mass index is 28.98 kg/m.   IBW: 45 kg    Pertinent Meds: Pepcid, folic acid, lasix, bio-k plus, solu-medrol, multivitamin, k-dur, pravastatin, coumadin    Recent Labs   Lab 01/05/19  1039 01/04/19  0629 01/03/19  0443 01/02/19  0512 01/01/19  0653   Sodium 143 142 139 136 138   Potassium 3.8 3.7 4.0 3.7 3.7   Chloride 103 104 102 101 102   CO2 28 24 24 24 24    BUN 46* 39* 31* 28* 20   Creatinine 1.48* 1.44* 1.32* 1.34* 1.31*   Glucose 227* 183* 227* 218* 118*   Calcium 8.8 8.9 9.2 9.0 8.9   Magnesium  --   --   --   --  2.0   Phosphorus  --   --   --   --  2.9        Diet Order:  Orders Placed This Encounter   Procedures   . Diet cardiac Fluid restriction: 1800 ML FLUID   . Thrive Chocolate Ice Cream Cup Quantity: A. One; Frequency: BID with meals LUNCH and DINNER        GI symptoms:  +BM today   Hydration: WDL  Skin: WDL      Estimated Needs:  Estimated Energy Needs  Total Energy Estimated Needs: 1000-1200 kcal  Method for Estimating Needs: 22-25 kcal/kg ibw (45 kg)  Estimated Protein Needs  Total Protein Estimated Needs: 54-68 gm  Method for Estimating Needs: 1.2-1.5 gm/kg ibw  (45 kg)       Additional Comments:       Daiva Eves, RD  01/06/2019 12:32 PM

## 2019-01-06 NOTE — Respiratory Progress Note (Signed)
RESPIRATORY SERVICES CARE PLAN   Patient Name: [facility] WHITE   Attending Physician: Alvino Blood, MD   Today's date:    01/06/2019 LOS: 6 days   CURRENT THERAPY  WEANING ATTEMPT    High Flow    Patient is actively being weaned, progressing, and tolerating well this shift.   OXYGEN THERAPY INCREASED REQUREMENTS THIS SHIFT   SpO2: 98 % (01/06/2019  3:48 AM)  O2 Device: HFNC (01/06/2019  3:48 AM)  FiO2: (S) 90 % (01/06/2019  3:48 AM)  O2 Flow Rate (L/min): 45 L/min (01/06/2019  3:48 AM)                LAST ABG AIRWAY                     RECOMMENDATIONS/COMMENTS:                                                               Patient weaned from 45L 100% to 45L 90%. Patient tolerating well and O2 saturations are above 90%.

## 2019-01-06 NOTE — Plan of Care (Addendum)
NURSE NOTE SUMMARY  [facility] - GI/ENDO/GEN MED   Patient Name: [facility] WHITE   Attending Physician: Alvino Blood, MD   Today's date:   01/06/2019 LOS: 6 days   Shift Summary:                                                              9147- assessed patient, no complaints of pain or anxiety and appears calm. Discussed plan for the day including mobility to the bedside commode and controlling anxiety. No other needs at this time.     Provider Notifications:      Rapid Response Notifications:  Mobility:      PMP Activity: Step 6 - Walks in Room (01/05/2019 10:30 PM)     Weight tracking:  Family Dynamic:   Last 3 Weights for the past 72 hrs (Last 3 readings):   Weight   01/06/19 0429 67.3 kg (148 lb 5.9 oz)   01/05/19 0331 66.8 kg (147 lb 4.8 oz)   01/04/19 0450 67.9 kg (149 lb 12.8 oz)             Recent Vitals Last Bowel Movement   BP: 112/80 (01/06/2019  4:29 PM)  Heart Rate: (!) 56 (01/06/2019  4:29 PM)  Temp: 97.5 F (36.4 C) (01/06/2019  4:29 PM)  Resp Rate: 16 (01/06/2019  4:29 PM)  Weight: 67.3 kg (148 lb 5.9 oz) (01/06/2019  4:29 AM)  SpO2: 95 % (01/06/2019  4:29 PM)   No data recorded       Problem: Moderate/High Fall Risk Score >5  Goal: Patient will remain free of falls  Outcome: Progressing  Flowsheets (Taken 01/05/2019 2230 by Clover Mealy, RN)  VH High Risk (Greater than 13): ALL REQUIRED LOW INTERVENTIONS;ALL REQUIRED MODERATE INTERVENTIONS;RED "HIGH FALL RISK" SIGNAGE;BED ALARM WILL BE ACTIVATED WHEN THE PATEINT IS IN BED WITH SIGNAGE "RESET BED ALARM";A CHAIR PAD ALARM WILL BE USED WHEN PATIENT IS UP SITTING IN A CHAIR;PATIENT IS TO BE SUPERVISED FOR ALL TOILETING ACTIVITIES;Keep door open for better visibility;Include family/significant other in multidisciplinary discussion regarding plan of care as appropriate;Use assistive devices     Problem: Compromised Tissue integrity  Goal: Damaged tissue is healing and protected  Outcome: Progressing  Flowsheets (Taken 01/05/2019 1328  by Caryl Never, RN)  Damaged tissue is healing and protected : Monitor/assess Braden scale every shift;Reposition patient every 2 hours and as needed unless able to reposition self;Increase activity as tolerated/progressive mobility;Relieve pressure to bony prominences for patients at moderate and high risk;Avoid shearing injuries;Keep intact skin clean and dry;Use incontinence wipes for cleaning urine, stool and caustic drainage. Foley care as needed;Monitor external devices/tubes for correct placement to prevent pressure, friction and shearing;Monitor patient's hygiene practices  Goal: Nutritional status is improving  Outcome: Progressing  Flowsheets (Taken 01/05/2019 1328 by Caryl Never, RN)  Nutritional status is improving: Allow adequate time for meals;Assist patient with eating     Problem: Compromised Hemodynamic Status  Goal: Vital signs and fluid balance maintained/improved  Outcome: Progressing  Flowsheets (Taken 01/05/2019 1328 by Caryl Never, RN)  Vital signs and fluid balance are maintained/improved: Position patient for maximum circulation/cardiac output;Monitor/assess vitals and hemodynamic parameters with position changes;Monitor intake and output. Notify LIP if urine output is less than 30 mL/hour.;Monitor/assess  lab values and report abnormal values     Problem: Inadequate Gas Exchange  Goal: Adequate oxygenation and improved ventilation  Outcome: Progressing  Flowsheets (Taken 01/05/2019 1328 by Caryl Never, RN)  Adequate oxygenation and improved ventilation: Assess lung sounds;Monitor SpO2 and treat as needed;Provide mechanical and oxygen support to facilitate gas exchange;Position for maximum ventilatory efficiency;Teach/reinforce use of incentive spirometer 10 times per hour while awake, cough and deep breath as needed;Plan activities to conserve energy: plan rest periods;Increase activity as tolerated/progressive mobility;Consult/collaborate with  Respiratory Therapy     Problem: Inadequate Airway Clearance  Goal: Normal respiratory rate/effort achieved/maintained  Outcome: Progressing  Flowsheets (Taken 01/05/2019 1328 by Caryl Never, RN)  Normal respiratory rate/effort achieved/maintained: Plan activities to conserve energy: plan rest periods

## 2019-01-06 NOTE — Progress Notes (Signed)
PROGRESS NOTE    Date Time: 01/06/19 12:03 PM  Patient Name: Kelli Brown, Kelli Brown  MRN:  [id number]    Assessment/Plan:   Active Problems:    Pneumonia    Acute exacerbation of CHF (congestive heart failure)    Acute respiratory failure with hypoxia    Hypertension    Chronic kidney disease, stage III (moderate)    Hyperlipidemia    1.  Hypoxia-severe and minimal improvements.  Secondary to the interstitial lung disease and pulmonary hypertension.  Patient notes that she understands that her time is short.  She wants to inform her family.  Would recommend palliative care consult.  2.  Pulmonary hypertension-chronic.  Pressures have worsened.  Likely major player.  Diuresis tolerates.  3.  Interstitial lung disease-finish the antibiotics.  On steroids.  Afebrile.    Overall would recommend palliative care or hospice consult.  End-of-life discussion had.  No decisions were made but patient verbalized understanding    Subjective:   Breathing is about the same.  No chest pain.  The following portions of the patient's history were reviewed and updated as appropriate: allergies and current medications.    Physical Exam:     Vitals:    01/06/19 1139   BP: 113/56   Pulse: 82   Resp: 16   Temp: 97.9 F (36.6 C)   SpO2: 91%     Focused Exam:  General appearance - oriented to person, place, and time and chronically ill appearing  Mental status - alert, oriented to person, place, and time  Eyes - pupils equal and reactive, extraocular eye movements intact, sclera anicteric  Mouth - mucous membranes moist, pharynx normal without lesions  Neck - supple, no significant adenopathy  Lymphatics -   Chest - no tachypnea, retractions or cyanosis, decreased air entry noted bilaterally with mild crackles  Heart - normal rate, regular rhythm, normal S1, S2, no murmurs, rubs, clicks or gallops  Abdomen - soft, nontender, nondistended, no masses or organomegaly  Neurological - alert, oriented, normal speech, no focal findings or movement  disorder noted, screening mental status exam normal, cranial nerves II through XII intact  Musculoskeletal -   Extremities - peripheral pulses normal, no pedal edema, no clubbing or cyanosis  Skin -     Labs:     Results     Procedure Component Value Units Date/Time    Prothrombin time/INR [[id number]]  (Abnormal) Collected:  01/06/19 0441    Specimen:  Blood Updated:  01/06/19 0537     PT 44.4 sec      PT INR 4.5        Recent Labs   Lab 01/04/19  0629 01/03/19  0443 01/02/19  0512   Eosinophils Absolute 0.0 0.0 0.1       Rads:     Radiology Results (24 Hour)     Procedure Component Value Units Date/Time    XR Chest AP Portable [[id number]] Collected:  01/06/19 4010    Order Status:  Completed Updated:  01/06/19 0754    Narrative:       Clinical History:  persistent hypoxemia    Examination:  XR CHEST AP PORTABLE    Comparison:  Recent exams    Technique:  Portable AP    Findings:  Heart size unchanged.  Persistent diffuse severe airspace disease.  Small effusions.  No pneumothorax.  No acute bony abnormality.      Impression:       No change.    ReadingStation:[id number]  Signed by: Teresita Madura, MD  Service:  @SERVICE @  Time/Date:  12:03 PM2/14/2020

## 2019-01-06 NOTE — Progress Note - Problem Oriented Charting Notewrit (Signed)
Pharmacy Consult Warfarin Dosing  Ferol Luz Hinnant    Age: 85 y.o.  Weight: 67.3 kg (148 lb 5.9 oz)  Indication: a fib  Goal INR: 2-3  Home warfarin dose =   2.5 mg daily except 5mg  Thurs and Sat  Interacting agent/disease: amiodarone, aspirin, zosyn    Subjective/Objective:   Kelli Brown admitted from assisted living with hypoxia and lethargy. Takes warfarin as outpatient reportedly at above dosage, however it is not on patient's home med list, per facility home med list is not regularly updated    Assessment/Plan:   Vitamin K 5 mg po on 2/9      INR today 4.5 - uptrend continuing.    Hold warfarin again tonight.    Daily INR   Monitor for signs and symptoms of bleeding   Avoid IM injections      Current warfarin dose =(hold for INR greater than 3.5)  Date 2/8 2/9 2/10 2/11 2/12 2/13 2/14     INR 8.8 9.7 1.5 1.4 2.1 3.6 4.5     dose Hold hold 5 mg 5 mg Hold Hold Hold     Hgb 11.1 10.3 9.8 10.3 9.9       Plt 259 245 247 286 271           Past Medical History:   Diagnosis Date    Abnormal vision     Arthritis     Atrial fibrillation     Congestive heart failure     Glaucoma     Hip fx, right, closed, initial encounter 2010    Hyperlipidemia     Hypertension     Low back pain     Macular degeneration     Nonrheumatic aortic (valve) stenosis 08/11/2017    Shingles         Recent Labs   Lab 01/04/19  0629 01/01/19  0653   Bilirubin, Total 0.9 0.7   Bilirubin, Direct  --  0.3   Protein, Total 6.3 6.1   Albumin 2.6* 2.7*   ALT 18 9   AST (SGOT) 23 20             If you have any questions, please contact the pharmacist at [phone].    [name], PharmD

## 2019-01-06 NOTE — Progress Notes (Signed)
Medicine Progress Note - [facility]      Date Time: 01/06/19 8:44 AM  Patient Name: Kelli Brown  Attending Physician: Alvino Blood, MD    Assessment:                                                                                        Active Problems:    Pneumonia    Acute exacerbation of CHF (congestive heart failure)    Acute respiratory failure with hypoxia    Hypertension    Chronic kidney disease, stage III (moderate)    Hyperlipidemia        Plan:                                                                                                      Acute hypoxic respiratory failurelikelydue to pneumonia/diastolic heart failure  Continues to requireHFNC40L/min FiO2 of 90%, slowly weaning  Dr. Jacqlyn Larsen (pulm) consulted, appreciate recommendations  Maintain oxygen saturation>90%    Pneumonia/ Pneumonitis  CXR/CT: consistent with multifocal pneumonia/pneumonitis  Repeat imaging unchanged  MRSA nares neg, will discontinue vanc  Continue Zosyn 2.25 g q8h  Blood cx NGTD  Solumederol 40 mg q8h  Mucinex and claritin    Acute on chronic diastolic heart failure/moderate aortic stenosis  Switched to oral lasix 20 mg BID  Strict I's/O, daily weights  Echo: LVH, stable aortic stenosis, moderate tricuspid regurg, moderate to severe pulmonary artery pressure    CKD stage III  Creatinine 1.48  Baseline: 1.3  Monitor while on lasix    Supra therapeutic INR, resolved  Pharmacy to dose warfarin  Added multivitamin    Mild anemia due to CKD  Monitor h/H    Paroxysmal atrial fibrillation  Amiodarone100mg   Metoprolol50mg   Pharmacy to dosewarfarin    Hypertension controlled  Metoprolol50mg     Hyperlipidemia  Statin40mg     DVT ZOX:[id number]  Lines:                                                                                                       Patient Lines/Drains/Airways Status    Active PICC Line / CVC Line / PIV Line / Drain / Airway / Intraosseous Line  / Epidural Line / ART Line / Line / Wound / Pressure Ulcer / NG/OG  Tube     Name:   Placement date:   Placement time:   Site:   Days:    Peripheral IV 01/01/19 Right Forearm   01/01/19    0030    Forearm   5                Disposition:                                                                                             Today's date: 01/06/2019  Length of Stay: 6  Code Status: NO CPR - SUPPORT OK  Anticipated medical stability for discharge in : 4 days    Subjective                                                                                             CC: <principal problem not specified>     Patient has no complaints this morning. She states she does not feel short of breath and has not been cough. Denies chest pain, swelling, or palpitations. Family to be here late morning       Review of Systems:                                                                               Review of Systems   Constitutional: Negative for chills and fever.   HENT: Negative for congestion and sore throat.    Respiratory: Negative for cough and shortness of breath.    Cardiovascular: Negative for chest pain and leg swelling.   Gastrointestinal: Negative for abdominal pain, diarrhea, nausea and vomiting.   Genitourinary: Negative for dysuria, frequency and urgency.   Musculoskeletal: Negative for arthralgias and myalgias.   Skin: Negative for pallor and rash.   Neurological: Negative for dizziness and headaches.       Physical Exam:                                                                                     Temp:  [96.1 F (35.6 C)-99.7 F (37.6 C)] 97.5 F (36.4 C)  Heart Rate:  [85-100] 92  Resp Rate:  [  16-22] 20  BP: (98-128)/(44-63) 118/49  FiO2:  [90 %-100 %] 90 %    Intake/Output Summary (Last 24 hours) at 01/06/2019 0844  Last data filed at 01/05/2019 2355  Gross per 24 hour   Intake 240 ml   Output 250 ml   Net -10 ml       General: Awake, alert, oriented x 3; No acute distress.  HEENT: EOMI, Sclera  anicteric  Oropharynx clear without lesions, mucous membranes moist  Glands: No cervical or axillary lymphadenopathy.  Neck: Supple, no lymphadenopathy, no thyromegaly  Cardiovascular: systolic murmur consistent with aortic stenosis  Lungs: lungs with good air movement, no wheezes or ronchi, no repsiratory distress, no use of accessory muscles   Abdomen: Soft, non-tender, non-distended; no palpable masses, normoactive bowel sounds, no rebound or guarding  Extremities: No clubbing, cyanosis, or edema  Neuro: Cranial nerves grossly intact, strength 5/5 in upper and lower extremities, sensation intact  Psych: Normal affect, not depressed   Skin: No rashes or lesions noted    Meds:                                                                                                       Medications were reviewed in the electronic record: [x]       Estimated Creatinine Clearance: 18.6 mL/min (A) (based on SCr of 1.48 mg/dL (H)).  Current Facility-Administered Medications   Medication Dose Route Frequency   . albuterol  2.5 mg Nebulization Q4H SCH   . amiodarone  100 mg Oral Daily   . aspirin EC  81 mg Oral QAM   . dorzolamide  1 drop Left Eye TID   . famotidine  10 mg Oral Daily   . folic acid  1 mg Oral Daily   . furosemide  20 mg Oral BID   . guaiFENesin  600 mg Oral Q12H SCH   . lactobacillus species  50 Billion CFU Oral Daily   . loratadine  10 mg Oral Daily   . methylPREDNISolone  40 mg Intravenous Q8H   . metoprolol succinate XL  50 mg Oral QAM   . piperacillin-tazobactam  2.25 g Intravenous Q8H   . potassium chloride  20 mEq Oral QAM   . pravastatin  40 mg Oral QAM   . sodium chloride (PF)  3 mL Intravenous Q8H   . warfarin therapy placeholder  1 each Does not apply See Admin Instructions     PRN medications: acetaminophen **OR** acetaminophen **OR** acetaminophen, albuterol, LORazepam, naloxone, ondansetron **OR** ondansetron, prochlorperazine  IV Drips:       Labs and Imaging:                                                                                  Results  Procedure Component Value Units Date/Time    Prothrombin time/INR [[id number]]  (Abnormal) Collected:  01/06/19 0441    Specimen:  Blood Updated:  01/06/19 0537     PT 44.4 sec      PT INR 4.5    Basic Metabolic Panel [[id number]]  (Abnormal) Collected:  01/05/19 1039    Specimen:  Plasma Updated:  01/05/19 1144     Sodium 143 mMol/L      Potassium 3.8 mMol/L      Chloride 103 mMol/L      CO2 28 mMol/L      Calcium 8.8 mg/dL      Glucose 782 mg/dL      Creatinine 9.56 mg/dL      BUN 46 mg/dL      Anion Gap 21.3 mMol/L      BUN/Creatinine Ratio 31.1 Ratio      EGFR 29 mL/min/1.68m2      Osmolality Calculated 304 mOsm/kg     B-type Natriuretic Peptide [[id number]]  (Abnormal) Collected:  01/05/19 1039    Specimen:  Blood Updated:  01/05/19 1138     B-Natriuretic Peptide 143.6 pg/mL           Microbiology, reviewed and are significant for:  Microbiology Results     Procedure Component Value Units Date/Time    Blood Culture [[id number]] Collected:  01/02/19 1300    Specimen:  Blood from Venipuncture Updated:  01/04/19 0502    Narrative:       Specimen/Source: Blood/Venipuncture  Collected: 01/02/2019 13:00     Status: Valued      Last Updated: 01/04/2019 05:00                Culture Result (Prelim)      No Growth To Date          Blood Culture [[id number]] Collected:  01/02/19 1300    Specimen:  Blood from Venipuncture Updated:  01/04/19 0502    Narrative:       Specimen/Source: Blood/Venipuncture  Collected: 01/02/2019 13:00     Status: Valued      Last Updated: 01/04/2019 05:00                Culture Result (Prelim)      No Growth To Date          Influenza A / B Rapid Test [[id number]] Collected:  12/31/18 1108    Specimen:  Nasal Wash Updated:  12/31/18 1143     Influenza A Negative     Influenza B Negative     Comment: Method: Jarvis Morgan    The sensitivity for this method is between 90% and 95% for Influenza A and around 90% for Influenza B. The specificity for both  Influenza A and B is around 96%. False positive results may occur, especially when the prevalence of Influenza activity is low. This is more likely with Influenza B due to its lower prevalence. Clinical conditions, including the prevalence of influenza activity, should be considered in the interpretation of results. If clinically indicated, results may be confirmed with PCR testing.  The above 2 analytes were performed by [facility] [phone])  [address] [address]         Narrative:       Influenza A antigen detection tests are unable to distinquish between novel and seasonal influenza A.    A negative result for either Influenza A or B antigen does not exclude influenza virus infection. Clinical correlation required.  All positive influenza antigen tests (A or B) require placement of patient on droplet precaution isolation.    Nares MRSA Detection by DNA Amplification [[id number]] Collected:  01/04/19 0224    Specimen:  Nares Updated:  01/04/19 0415     MRSA No MRSA detected.     Comment: The above 1 analytes were performed by [facility] Lab [phone])  [address],[place] [address]               Imaging, reviewed and are significant for:  Radiology Results (24 Hour)     Procedure Component Value Units Date/Time    XR Chest AP Portable [[id number]] Collected:  01/06/19 0752    Order Status:  Completed Updated:  01/06/19 0754    Narrative:       Clinical History:  persistent hypoxemia    Examination:  XR CHEST AP PORTABLE    Comparison:  Recent exams    Technique:  Portable AP    Findings:  Heart size unchanged.  Persistent diffuse severe airspace disease.  Small effusions.  No pneumothorax.  No acute bony abnormality.      Impression:       No change.    ReadingStation:[id number]            Signed:  Darral Dash, MD   SVRP R1, [phone]

## 2019-01-07 DIAGNOSIS — R0902 Hypoxemia: Secondary | ICD-10-CM

## 2019-01-07 LAB — COMPREHENSIVE METABOLIC PANEL
ALT: 18 U/L (ref 0–55)
AST (SGOT): 17 U/L (ref 10–42)
Albumin/Globulin Ratio: 0.93 Ratio (ref 0.80–2.00)
Albumin: 2.8 gm/dL — ABNORMAL LOW (ref 3.5–5.0)
Alkaline Phosphatase: 72 U/L (ref 40–145)
Anion Gap: 16.3 mMol/L (ref 7.0–18.0)
BUN / Creatinine Ratio: 33.8 Ratio — ABNORMAL HIGH (ref 10.0–30.0)
BUN: 44 mg/dL — ABNORMAL HIGH (ref 7–22)
Bilirubin, Total: 1.2 mg/dL (ref 0.1–1.2)
CO2: 29 mMol/L (ref 20–30)
Calcium: 8.7 mg/dL (ref 8.5–10.5)
Chloride: 101 mMol/L (ref 98–110)
Creatinine: 1.3 mg/dL — ABNORMAL HIGH (ref 0.60–1.20)
EGFR: 34 mL/min/{1.73_m2} — ABNORMAL LOW (ref 60–150)
Globulin: 3 gm/dL (ref 2.0–4.0)
Glucose: 239 mg/dL — ABNORMAL HIGH (ref 71–99)
Osmolality Calculated: 302 mOsm/kg — ABNORMAL HIGH (ref 275–300)
Potassium: 4.3 mMol/L (ref 3.5–5.3)
Protein, Total: 5.8 gm/dL — ABNORMAL LOW (ref 6.0–8.3)
Sodium: 142 mMol/L (ref 136–147)

## 2019-01-07 LAB — CBC AND DIFFERENTIAL
Bands: 5 % (ref 0–10)
Basophils %: 0 % (ref 0.0–3.0)
Basophils Absolute: 0 10*3/uL (ref 0.0–0.3)
Eosinophils %: 0 % (ref 0.0–7.0)
Eosinophils Absolute: 0 10*3/uL (ref 0.0–0.8)
Hematocrit: 33.1 % — ABNORMAL LOW (ref 36.0–48.0)
Hemoglobin: 10.4 gm/dL — ABNORMAL LOW (ref 12.0–16.0)
Lymphocytes Absolute: 0.8 10*3/uL (ref 0.6–5.1)
Lymphocytes: 5 % — ABNORMAL LOW (ref 15.0–46.0)
MCH: 32 pg (ref 28–35)
MCHC: 32 gm/dL (ref 32–36)
MCV: 101 fL — ABNORMAL HIGH (ref 80–100)
MPV: 7 fL (ref 6.0–10.0)
Metamyelocytes: 3 % — ABNORMAL HIGH (ref 0–0)
Monocytes Absolute: 0.8 10*3/uL (ref 0.1–1.7)
Monocytes: 5 % (ref 3.0–15.0)
Myelocytes: 6 % — ABNORMAL HIGH (ref 0–0)
Neutrophils %: 75 % (ref 42.0–78.0)
Neutrophils Absolute: 14 10*3/uL — ABNORMAL HIGH (ref 1.7–8.6)
PLT CT: 288 10*3/uL (ref 130–440)
Promyelocytes: 1 % — ABNORMAL HIGH (ref 0–0)
RBC: 3.28 10*6/uL — ABNORMAL LOW (ref 3.80–5.00)
RDW: 17.4 % — ABNORMAL HIGH (ref 11.0–14.0)
WBC: 15.6 10*3/uL — ABNORMAL HIGH (ref 4.0–11.0)

## 2019-01-07 LAB — PT/INR
PT INR: 3.8 — ABNORMAL HIGH (ref 0.5–1.3)
PT: 38.1 s — ABNORMAL HIGH (ref 9.5–11.5)

## 2019-01-07 MED ORDER — FUROSEMIDE 10 MG/ML IJ SOLN
40.00 mg | Freq: Once | INTRAMUSCULAR | Status: AC
Start: 2019-01-07 — End: 2019-01-07
  Administered 2019-01-07: 19:00:00 40 mg via INTRAVENOUS
  Filled 2019-01-07: qty 4

## 2019-01-07 NOTE — Progress Note - Problem Oriented Charting Notewrit (Signed)
Pharmacy Consult Warfarin Dosing  Kelli Brown    Age: 85 y.o.  Weight: 68 kg (149 lb 14.4 oz)  Indication: a fib  Goal INR: 2-3  Home warfarin dose =   2.5 mg daily except 5mg  Thurs and Sat  Interacting agent/disease: amiodarone, aspirin, zosyn    Subjective/Objective:   89 yof admitted from assisted living with hypoxia and lethargy. Takes warfarin as outpatient reportedly at above dosage, however it is not on patient's home med list, per facility home med list is not regularly updated    Assessment/Plan:   Vitamin K 5 mg po on 2/9      INR today 3.8 today.    Hold warfarin again tonight.    Daily INR   Monitor for signs and symptoms of bleeding   Avoid IM injections      Current warfarin dose =(hold for INR greater than 3.5)  Date 2/8 2/9 2/10 2/11 2/12 2/13 2/14 2-15    INR 8.8 9.7 1.5 1.4 2.1 3.6 4.5 3.8    dose Hold hold 5 mg 5 mg Hold Hold Hold hold    Hgb 11.1 10.3 9.8 10.3 9.9       Plt 259 245 247 286 271           Past Medical History:   Diagnosis Date    Abnormal vision     Arthritis     Atrial fibrillation     Congestive heart failure     Glaucoma     Hip fx, right, closed, initial encounter 2010    Hyperlipidemia     Hypertension     Low back pain     Macular degeneration     Nonrheumatic aortic (valve) stenosis 08/11/2017    Shingles         Recent Labs   Lab 01/04/19  0629 01/01/19  0653   Bilirubin, Total 0.9 0.7   Bilirubin, Direct  --  0.3   Protein, Total 6.3 6.1   Albumin 2.6* 2.7*   ALT 18 9   AST (SGOT) 23 20             If you have any questions, please contact the pharmacist at [phone].    Carmin Richmond, [facility]

## 2019-01-07 NOTE — Progress Notes (Signed)
Medicine Progress Note - [facility]      Date Time: 01/07/19 8:27 AM  Patient Name: Kelli Brown WHITE  Attending Physician: Alvino Blood, MD    Assessment:                                                                                        Active Problems:    Pneumonia    Acute exacerbation of CHF (congestive heart failure)    Acute respiratory failure with hypoxia    Hypertension    Chronic kidney disease, stage III (moderate)    Hyperlipidemia        Plan:                                                                                                      Acute hypoxic respiratory failurelikelydue to pneumonia/diastolic heart failure  Continues to requireHFNC40L/min FiO2 of90%, unable to wean  Dr. Jacqlyn Larsen (pulm) consulted, appreciate recommendations  Maintain oxygen saturation>90%    Pneumonia/ Pneumonitis  CXR/CT: consistent with multifocal pneumonia/pneumonitis  Repeat imaging unchanged  MRSA nares neg, will discontinue vanc  Continue Zosyn 2.25 g q8h (day 6)  Blood cx NGTD  Solumederol 40 mg q8h  Mucinex and claritin    Acute on chronic diastolic heart failure/moderate aortic stenosis  Lasix 20 mg PO BID  Strict I's/O, daily weights  Echo: LVH, stable aortic stenosis, moderate tricuspid regurg, moderate to severe pulmonary artery pressure    CKD stage III  Baseline: 1.3  Monitor while on lasix    Supra therapeutic INR, resolved  Pharmacy to dose warfarin  Added multivitamin  INR improving    Mild anemia due to CKD  Monitor h/H    Paroxysmal atrial fibrillation  Amiodarone100mg   Metoprolol50mg   Pharmacy to dosewarfarin    Hypertension controlled  Metoprolol50mg     Hyperlipidemia  Statin40mg     DVT ZOX:WRUEAVWU  Lines:                                                                                                       Patient Lines/Drains/Airways Status    Active PICC Line / CVC Line / PIV Line / Drain / Airway / Intraosseous Line /  Epidural Line / ART Line / Line / Wound / Pressure Ulcer / NG/OG  Tube     Name:   Placement date:   Placement time:   Site:   Days:    Peripheral IV 01/01/19 Right Forearm   01/01/19    0030    Forearm   6                Disposition:                                                                                             Today's date: 01/07/2019  Length of Stay: 7  Code Status: NO CPR - SUPPORT OK  Anticipated medical stability for discharge in : 4 days    Subjective                                                                                             CC: <principal problem not specified>     Patient states she feels short of breath but has no been coughing very much. She has no other comaplints. Denies chest pain, palpitations, N/V/D.       Review of Systems:                                                                               Review of Systems   Constitutional: Negative for chills and fever.   HENT: Negative for congestion and sore throat.    Respiratory: Positive for shortness of breath. Negative for cough.    Cardiovascular: Negative for chest pain and leg swelling.   Gastrointestinal: Negative for abdominal pain, diarrhea, nausea and vomiting.   Genitourinary: Negative for dysuria, frequency and urgency.   Musculoskeletal: Negative for arthralgias and myalgias.   Skin: Negative for pallor and rash.   Neurological: Negative for dizziness and headaches.       Physical Exam:                                                                                     Temp:  [96.8 F (36 C)-98.4 F (36.9 C)] 96.8 F (36 C)  Heart Rate:  [56-90] 82  Resp Rate:  [16-20] 20  BP: (105-153)/(45-80) 109/52  FiO2:  [80 %-90 %] 90 %    Intake/Output Summary (Last 24 hours) at 01/07/2019 0827  Last data filed at 01/07/2019 0326  Gross per 24 hour   Intake 960 ml   Output -   Net 960 ml       General: Awake, alert, oriented x 3; No acute distress.  HEENT: EOMI, Sclera anicteric  Oropharynx clear without lesions,  mucous membranes moist  Glands: No cervical or axillary lymphadenopathy.  Neck: Supple, no lymphadenopathy, no thyromegaly  Cardiovascular: RRR, no murmurs, rubs or gallops  Lungs: clear breath sounds, no wheezes, rhonchi or rales  Abdomen: Soft, non-tender, non-distended; no palpable masses, normoactive bowel sounds, no rebound or guarding  Extremities: No clubbing, cyanosis, or edema  Neuro: Cranial nerves grossly intact, strength 5/5 in upper and lower extremities, sensation intact  Psych: Normal affect, not depressed   Skin: No rashes or lesions noted    Meds:                                                                                                       Medications were reviewed in the electronic record: [x]       Estimated Creatinine Clearance: 18.7 mL/min (A) (based on SCr of 1.48 mg/dL (H)).  Current Facility-Administered Medications   Medication Dose Route Frequency   . albuterol  2.5 mg Nebulization Q4H SCH   . amiodarone  100 mg Oral Daily   . aspirin EC  81 mg Oral QAM   . dorzolamide  1 drop Left Eye TID   . famotidine  10 mg Oral Daily   . folic acid  1 mg Oral Daily   . furosemide  20 mg Oral BID   . guaiFENesin  600 mg Oral Q12H SCH   . lactobacillus species  50 Billion CFU Oral Daily   . loratadine  10 mg Oral Daily   . methylPREDNISolone  40 mg Intravenous Q8H   . metoprolol succinate XL  50 mg Oral QAM   . multivitamin  1 tablet Oral Daily   . piperacillin-tazobactam  2.25 g Intravenous Q8H   . potassium chloride  20 mEq Oral QAM   . pravastatin  40 mg Oral QAM   . sodium chloride (PF)  3 mL Intravenous Q8H   . warfarin therapy placeholder  1 each Does not apply See Admin Instructions     PRN medications: acetaminophen **OR** acetaminophen **OR** acetaminophen, albuterol, LORazepam, naloxone, ondansetron **OR** ondansetron, prochlorperazine  IV Drips:       Labs and Imaging:                                                                                 Results     Procedure Component Value  Units Date/Time  Prothrombin time/INR [[id number]] Collected:  01/07/19 0502    Specimen:  Blood Updated:  01/07/19 0704          Microbiology, reviewed and are significant for:  Microbiology Results     Procedure Component Value Units Date/Time    Blood Culture [[id number]] Collected:  01/02/19 1300    Specimen:  Blood from Venipuncture Updated:  01/04/19 0502    Narrative:       Specimen/Source: Blood/Venipuncture  Collected: 01/02/2019 13:00     Status: Valued      Last Updated: 01/04/2019 05:00                Culture Result (Prelim)      No Growth To Date          Blood Culture [[id number]] Collected:  01/02/19 1300    Specimen:  Blood from Venipuncture Updated:  01/04/19 0502    Narrative:       Specimen/Source: Blood/Venipuncture  Collected: 01/02/2019 13:00     Status: Valued      Last Updated: 01/04/2019 05:00                Culture Result (Prelim)      No Growth To Date          Influenza A / B Rapid Test [[id number]] Collected:  12/31/18 1108    Specimen:  Nasal Wash Updated:  12/31/18 1143     Influenza A Negative     Influenza B Negative     Comment: Method: Jarvis Morgan    The sensitivity for this method is between 90% and 95% for Influenza A and around 90% for Influenza B. The specificity for both Influenza A and B is around 96%. False positive results may occur, especially when the prevalence of Influenza activity is low. This is more likely with Influenza B due to its lower prevalence. Clinical conditions, including the prevalence of influenza activity, should be considered in the interpretation of results. If clinically indicated, results may be confirmed with PCR testing.  The above 2 analytes were performed by [facility] [phone])  [address] [address]         Narrative:       Influenza A antigen detection tests are unable to distinquish between novel and seasonal influenza A.    A negative result for either Influenza A or B antigen does not exclude influenza  virus infection. Clinical correlation required.    All positive influenza antigen tests (A or B) require placement of patient on droplet precaution isolation.    Nares MRSA Detection by DNA Amplification [[id number]] Collected:  01/04/19 0224    Specimen:  Nares Updated:  01/04/19 0415     MRSA No MRSA detected.     Comment: The above 1 analytes were performed by [facility] [phone])  [address],[place] [address]               Imaging, reviewed and are significant for:  Radiology Results (24 Hour)     ** No results found for the last 24 hours. **              Signed:  Darral Dash, MD   SVRP R1, [phone]

## 2019-01-07 NOTE — Progress Notes (Signed)
Patient states feels stronger, had just had visit from her pastor. Granddaughters had come to visit and arrived before I left.     Carren Rang, CPE Intern       01/07/19 1400   Visit Type   Visit Type Initial   Present at Visit   Present at Visit Patient;Family Member(s)  (Dawn Reidy, CPE intern)   Spiritual Care Provided To   Spiritual Care Provided To Patient   Reason for Request   Reason for Request Spiritual Assessment;Spiritual Support   Spiritual Assessment   Spiritual Assessment Coping with Illness/Hospitalization;Positive outlook   Feelings Grateful   Spiritual Care Interventions   Spiritual Care Interventions Comfort, Encouragement, Affirmation;Reflective and Compassionate Listening   Explored Feelings Love   Spiritual Care Outcomes   Spiritual Care Outcomes Patient Expressed Appreciation of Visit;Family Expressed Appreciation of Visit   Consulted With Supervisor  Cherlyn Labella)   Length of Visit   Length of Visit 16-30 minutes   Follow-Up   Follow-up Follow-up routine

## 2019-01-07 NOTE — Progress Notes (Signed)
[facility] Pulmonary Specialist   Daily Progress Note( Weekend Coverage to Dr. Jacqlyn Larsen)      Patient Name: Kelli Brown, Kelli Brown  Date/Time: 01/07/19 5:18 PM  Attending Physician: Alvino Blood, MD  Location/Room: [phone]    Interval Events:   Minimally able to wean Fio2 to 80 % from 90% HFNC    Assessment:   Acute on chronic CHF  CKD  Pulmonary HTN  ILD  Acute hypoxic respiratory failure    Plan and Recommendation   Anticipating to transition care to palliative care  Continue steroid  Increase diuresis to IV lasix    Subjective:   Feeling ok,denies cough or sputum production.   Have not been able to get out of bed due to dyspnea  ROS   Review of Systems   Constitutional: Positive for malaise/fatigue. Negative for chills and fever.   HENT: Negative for congestion, ear discharge and ear pain.    Eyes: Negative for double vision, photophobia and pain.   Respiratory: Positive for shortness of breath. Negative for cough, hemoptysis, sputum production and wheezing.    Cardiovascular: Negative for chest pain, palpitations, orthopnea and leg swelling.   Gastrointestinal: Negative for abdominal pain, blood in stool, diarrhea, heartburn, nausea and vomiting.   Genitourinary: Negative for dysuria and urgency.   Musculoskeletal: Negative for back pain, joint pain and neck pain.   Neurological: Negative for dizziness, sensory change, speech change, focal weakness and headaches.   Psychiatric/Behavioral: Negative for hallucinations.     Physical Exam:   Vitals: BP 109/50   Pulse 81   Temp (!) 96.8 F (36 C) (Oral)   Resp 12   Ht 1.524 m (5') Comment: stated  Wt 68 kg (149 lb 14.4 oz)   SpO2 91%   BMI 29.28 kg/m   I/O:    Intake/Output Summary (Last 24 hours) at 01/07/2019 1718  Last data filed at 01/07/2019 1300  Gross per 24 hour   Intake 960 ml   Output -   Net 960 ml     Vent settings: FiO2:  [80 %-90 %] 80 %    Physical Exam  Vitals signs reviewed.   Constitutional:       General: She is not in acute  distress.     Appearance: Normal appearance. She is not ill-appearing.   HENT:      Head: Normocephalic and atraumatic.      Nose: Nose normal. No congestion or rhinorrhea.      Mouth/Throat:      Mouth: Mucous membranes are moist.      Pharynx: No oropharyngeal exudate or posterior oropharyngeal erythema.   Eyes:      General:         Right eye: No discharge.         Left eye: No discharge.      Extraocular Movements: Extraocular movements intact.      Pupils: Pupils are equal, round, and reactive to light.   Neck:      Musculoskeletal: Normal range of motion and neck supple. No neck rigidity.   Cardiovascular:      Rate and Rhythm: Normal rate and regular rhythm.      Pulses: Normal pulses.      Heart sounds: Murmur present.   Pulmonary:      Effort: Pulmonary effort is normal. No respiratory distress.      Breath sounds: Normal breath sounds. No stridor. No wheezing or rhonchi.   Abdominal:      General: Abdomen is  flat. Bowel sounds are normal. There is no distension.      Palpations: Abdomen is soft.      Tenderness: There is no abdominal tenderness. There is no guarding.   Musculoskeletal:         General: No swelling, tenderness or deformity.   Skin:     General: Skin is warm and dry.      Capillary Refill: Capillary refill takes less than 2 seconds.   Neurological:      General: No focal deficit present.      Mental Status: She is alert and oriented to person, place, and time.   Psychiatric:         Mood and Affect: Mood normal.         Inpatient Medications     Current Medications    Scheduled     Medication Dose/Rate, Route, Frequency Last Action    albuterol (PROVENTIL) (2.5 MG/3ML) 0.083% nebulizer solution 2.5 mg 2.5 mg, NEBULIZATION, Q4H [facility] Given: 02/15 1516    amiodarone (PACERONE) tablet 100 mg 100 mg, PO, Daily Given: 02/15 0813    aspirin EC tablet 81 mg 81 mg, PO, QAM Given: 02/15 0813    dorzolamide (TRUSOPT) 2 % ophthalmic solution 1 drop 1 drop, LEFT EYE, TID Given: 02/15 1419    famotidine  (PEPCID) tablet 10 mg 10 mg, PO, Daily Given: 02/15 0813    folic acid (FOLVITE) tablet 1 mg 1 mg, PO, Daily Given: 02/15 0813    furosemide (LASIX) injection 40 mg 40 mg, IV, Once Ordered    furosemide (LASIX) tablet 20 mg 20 mg, PO, BID Given: 02/15 1419    guaiFENesin (MUCINEX) 12 hr tablet 600 mg 600 mg, PO, Q12H [facility] Given: 02/15 0813    lactobacillus species (BIO-K PLUS) capsule 50 Billion CFU 50 Billion CFU, PO, Daily Given: 02/15 0813    loratadine (CLARITIN) tablet 10 mg 10 mg, PO, Daily Given: 02/15 0813    methylPREDNISolone sodium succinate (Solu-MEDROL) injection 40 mg 40 mg, IV, Q8H Given: 02/15 1703    metoprolol succinate XL (TOPROL-XL) 24 hr tablet 50 mg 50 mg, PO, QAM Given: 02/15 0813    multivitamin (CEROVITE) tablet 1 tablet 1 tablet, PO, Daily Given: 02/15 0813    piperacillin-tazobactam (ZOSYN) 2.25 g IVPB 50 mL (premix) 2.25 g 2.25 g, IV, Q8H New Bag: 02/15 1419    potassium chloride (K-DUR,KLOR-CON) CR tablet 20 mEq 20 mEq, PO, QAM Given: 02/15 0813    pravastatin (PRAVACHOL) tablet 40 mg 40 mg, PO, QAM Given: 02/15 0813    sodium chloride (PF) 0.9 % injection 3 mL 3 mL, IV, Q8H Given: 02/15 1703    warfarin therapy placeholder (COUMADIN therapy placeholder) 1 each 1 each, XX, See Admin Instructions Ordered         PRN     Medication Dose/Rate, Route, Frequency Last Action    acetaminophen (TYLENOL) 160 MG/5ML oral solution 650 mg No Dose/Rate, PER NG TUBE, Q4H PRN See Alternative: 02/10 1147    acetaminophen (TYLENOL) suppository 650 mg No Dose/Rate, RE, Q4H PRN See Alternative: 02/10 1147    acetaminophen (TYLENOL) tablet 650 mg 650 mg, PO, Q4H PRN Given: 02/10 1147    albuterol (PROVENTIL) (2.5 MG/3ML) 0.083% nebulizer solution 2.5 mg 2.5 mg, NEBULIZATION, Q6H PRN Ordered    LORazepam (ATIVAN) tablet 0.25 mg 0.25 mg, PO, Q6H PRN Ordered    naloxone (NARCAN) injection 0.4 mg 0.4 mg, IV, PRN Ordered    ondansetron (ZOFRAN) injection 4  mg 4 mg, IV, Q8H PRN Ordered    ondansetron (ZOFRAN-ODT)  disintegrating tablet 4 mg 4 mg, PO, Q8H PRN Ordered    prochlorperazine (COMPAZINE) injection 5 mg 5 mg, IV, Q4H PRN Ordered                Labs:   Interval Labs in the [facility] record were reviewed including the following:  CBC:   Recent Labs   Lab 01/07/19  0959 01/04/19  0629 01/03/19  0443   WBC 15.6* 16.5* 13.9*   RBC 3.28* 3.11* 3.27*   Hemoglobin 10.4* 9.9* 10.3*   Hematocrit 33.1* 31.1* 32.7*   MCV 101* 100 100   PLT CT 288 271 286     Chemistry:   Recent Labs   Lab 01/07/19  0959 01/05/19  1039 01/04/19  0629 01/03/19  0443 01/02/19  0512 01/01/19  0653   Sodium 142 143 142 139 136 138   Potassium 4.3 3.8 3.7 4.0 3.7 3.7   Chloride 101 103 104 102 101 102   CO2 29 28 24 24 24 24    BUN 44* 46* 39* 31* 28* 20   Creatinine 1.30* 1.48* 1.44* 1.32* 1.34* 1.31*   Glucose 239* 227* 183* 227* 218* 118*   Calcium 8.7 8.8 8.9 9.2 9.0 8.9   Magnesium  --   --   --   --   --  2.0     LFTs:   Recent Labs   Lab 01/07/19  0959 01/04/19  0629 01/01/19  0653   ALT 18 18 9    AST (SGOT) 17 23 20    Bilirubin, Total 1.2 0.9 0.7   Bilirubin, Direct  --   --  0.3   Albumin 2.8* 2.6* 2.7*   Alkaline Phosphatase 72 96 85     Cardiac Enzymes:     Coags:   Recent Labs   Lab 01/07/19  0502 01/06/19  0441 01/05/19  0520   PT INR 3.8* 4.5* 3.6*   PT 38.1* 44.4* 35.7*         Radiology / Imaging:    no new imaging     Signed by: Noreene Filbert, MD   [facility] Pulmonary Specialist  [id number], Dalbert Garnet, MD

## 2019-01-07 NOTE — Respiratory Progress Note (Signed)
RESPIRATORY SERVICES CARE PLAN   Patient Name: Kelli Brown   Attending Physician: Alvino Blood, MD   Today's date:    01/07/2019 LOS: 7 days   CURRENT THERAPY  WEANING ATTEMPT    High Flow    Patient is not tolerating weaning attempts this shift.  See reasoning below.   OXYGEN THERAPY INCREASED REQUREMENTS THIS SHIFT   SpO2: 91 % (01/07/2019  4:15 PM)  O2 Device: HFNC (01/07/2019  3:16 PM)  FiO2: 80 % (01/07/2019  3:16 PM)  O2 Flow Rate (L/min): 40 L/min (01/07/2019  3:16 PM)                LAST ABG AIRWAY                     RECOMMENDATIONS/COMMENTS:                                                               Pt weaned tp 80% from 90%  Unable to wean any further.

## 2019-01-07 NOTE — Plan of Care (Addendum)
NURSE NOTE SUMMARY  [facility] - GI/ENDO/GEN MED   Patient Name: Kelli Brown   Attending Physician: Alvino Blood, MD   Today's date:   01/07/2019 LOS: 7 days   Shift Summary:                                                              Assumed care of pt 0700. Pt denies pain, and shows no signs or symptoms of distress. Call bell within reach, bed alarm on and audible. Pt denies needs at this time; will continue to monitor.  Pt up to use BSC. Pt has small BM with each use of BSC.   Provider Notifications:      Rapid Response Notifications:  Mobility:      PMP Activity: Step 6 - Walks in Room (01/07/2019  8:23 AM)     Weight tracking:  Family Dynamic:   Last 3 Weights for the past 72 hrs (Last 3 readings):   Weight   01/07/19 0326 68 kg (149 lb 14.4 oz)   01/06/19 0429 67.3 kg (148 lb 5.9 oz)   01/05/19 0331 66.8 kg (147 lb 4.8 oz)             Recent Vitals Last Bowel Movement   BP: 118/57 (01/07/2019 11:15 AM)  Heart Rate: 86 (01/07/2019 11:15 AM)  Temp: 97.5 F (36.4 C) (01/07/2019 11:15 AM)  Resp Rate: 16 (01/07/2019 11:15 AM)  Weight: 68 kg (149 lb 14.4 oz) (01/07/2019  3:26 AM)  SpO2: 94 % (01/07/2019 11:21 AM)   No data recorded           Problem: Moderate/High Fall Risk Score >5  Goal: Patient will remain free of falls  Outcome: Progressing  Flowsheets (Taken 01/07/2019 0823)  VH High Risk (Greater than 13): ALL REQUIRED LOW INTERVENTIONS;ALL REQUIRED MODERATE INTERVENTIONS;RED "HIGH FALL RISK" SIGNAGE;BED ALARM WILL BE ACTIVATED WHEN THE PATEINT IS IN BED WITH SIGNAGE "RESET BED ALARM";A CHAIR PAD ALARM WILL BE USED WHEN PATIENT IS UP SITTING IN A CHAIR;PATIENT IS TO BE SUPERVISED FOR ALL TOILETING ACTIVITIES;Keep door open for better visibility;Include family/significant other in multidisciplinary discussion regarding plan of care as appropriate;Use assistive devices;Use chair-pad alarm device;Use of "STOP ask for help" sign     Problem: Compromised Tissue integrity  Goal: Nutritional  status is improving  Outcome: Progressing  Flowsheets (Taken 01/07/2019 1256)  Nutritional status is improving: Assist patient with eating; Allow adequate time for meals; Include patient/patient care companion in decisions related to nutrition     Problem: Inadequate Airway Clearance  Goal: Normal respiratory rate/effort achieved/maintained  Outcome: Progressing  Flowsheets (Taken 01/07/2019 1256)  Normal respiratory rate/effort achieved/maintained: Plan activities to conserve energy: plan rest periods

## 2019-01-07 NOTE — Plan of Care (Signed)
NURSE NOTE SUMMARY  [facility] - GI/ENDO/GEN MED   Patient Name: Kelli Brown Kelli Bell WHITE   Attending Physician: Alvino Blood, MD   Today's date:   01/07/2019 LOS: 7 days   Shift Summary:                                                              1900: Assumed care of pt. Pt resting in bed. Pt A&Ox4. Continuous pulse ox is on. Denies any needs at this time. Will continue to monitor.     2300: Assessment complete. Medications administered per MAR. Pt resting in bed and denies any pain or further needs. Bed alarm is set and call bell within reach. Will continue to monitor.    Provider Notifications:      Rapid Response Notifications:  Mobility:      PMP Activity: Step 6 - Walks in Room (01/06/2019 11:00 PM)     Weight tracking:  Family Dynamic:   Last 3 Weights for the past 72 hrs (Last 3 readings):   Weight   01/06/19 0429 67.3 kg (148 lb 5.9 oz)   01/05/19 0331 66.8 kg (147 lb 4.8 oz)   01/04/19 0450 67.9 kg (149 lb 12.8 oz)             Recent Vitals Last Bowel Movement   BP: 105/45 (01/06/2019  9:59 PM)  Heart Rate: 90 (01/06/2019  9:59 PM)  Temp: 98.2 F (36.8 C) (01/06/2019  9:59 PM)  Resp Rate: 20 (01/06/2019  9:59 PM)  Weight: 67.3 kg (148 lb 5.9 oz) (01/06/2019  4:29 AM)  SpO2: 96 % (01/07/2019  1:07 AM)   No data recorded           Problem: Moderate/High Fall Risk Score >5  Goal: Patient will remain free of falls  Outcome: Progressing  Flowsheets (Taken 01/06/2019 2300)  VH High Risk (Greater than 13): ALL REQUIRED LOW INTERVENTIONS;ALL REQUIRED MODERATE INTERVENTIONS;RED "HIGH FALL RISK" SIGNAGE;BED ALARM WILL BE ACTIVATED WHEN THE PATEINT IS IN BED WITH SIGNAGE "RESET BED ALARM";A CHAIR PAD ALARM WILL BE USED WHEN PATIENT IS UP SITTING IN A CHAIR;PATIENT IS TO BE SUPERVISED FOR ALL TOILETING ACTIVITIES;Keep door open for better visibility;Include family/significant other in multidisciplinary discussion regarding plan of care as appropriate;Use assistive devices     Problem: Compromised Tissue  integrity  Goal: Damaged tissue is healing and protected  Outcome: Progressing  Flowsheets (Taken 01/05/2019 1328 by Caryl Never, RN)  Damaged tissue is healing and protected : Monitor/assess Braden scale every shift;Reposition patient every 2 hours and as needed unless able to reposition self;Increase activity as tolerated/progressive mobility;Relieve pressure to bony prominences for patients at moderate and high risk;Avoid shearing injuries;Keep intact skin clean and dry;Use incontinence wipes for cleaning urine, stool and caustic drainage. Foley care as needed;Monitor external devices/tubes for correct placement to prevent pressure, friction and shearing;Monitor patient's hygiene practices  Goal: Nutritional status is improving  Outcome: Progressing  Flowsheets (Taken 01/05/2019 1328 by Caryl Never, RN)  Nutritional status is improving: Allow adequate time for meals;Assist patient with eating     Problem: Compromised Hemodynamic Status  Goal: Vital signs and fluid balance maintained/improved  Outcome: Progressing  Flowsheets (Taken 01/05/2019 1328 by Caryl Never, RN)  Vital signs and fluid balance are maintained/improved:  Position patient for maximum circulation/cardiac output;Monitor/assess vitals and hemodynamic parameters with position changes;Monitor intake and output. Notify LIP if urine output is less than 30 mL/hour.;Monitor/assess lab values and report abnormal values     Problem: Inadequate Gas Exchange  Goal: Adequate oxygenation and improved ventilation  Outcome: Progressing  Flowsheets (Taken 01/05/2019 1328 by Caryl Never, RN)  Adequate oxygenation and improved ventilation: Assess lung sounds;Monitor SpO2 and treat as needed;Provide mechanical and oxygen support to facilitate gas exchange;Position for maximum ventilatory efficiency;Teach/reinforce use of incentive spirometer 10 times per hour while awake, cough and deep breath as needed;Plan activities to  conserve energy: plan rest periods;Increase activity as tolerated/progressive mobility;Consult/collaborate with Respiratory Therapy

## 2019-01-08 ENCOUNTER — Inpatient Hospital Stay: Payer: Medicare Other

## 2019-01-08 LAB — PT/INR
PT INR: 3.3 — ABNORMAL HIGH (ref 0.5–1.3)
PT: 32.8 s — ABNORMAL HIGH (ref 9.5–11.5)

## 2019-01-08 MED ORDER — FUROSEMIDE 10 MG/ML IJ SOLN
40.00 mg | Freq: Once | INTRAMUSCULAR | Status: AC
Start: 2019-01-08 — End: 2019-01-08
  Administered 2019-01-08: 22:00:00 40 mg via INTRAVENOUS
  Filled 2019-01-08: qty 4

## 2019-01-08 MED ORDER — FUROSEMIDE 10 MG/ML IJ SOLN
40.00 mg | Freq: Two times a day (BID) | INTRAMUSCULAR | Status: DC
Start: 2019-01-08 — End: 2019-01-12
  Administered 2019-01-08 – 2019-01-12 (×8): 40 mg via INTRAVENOUS
  Filled 2019-01-08 (×10): qty 4

## 2019-01-08 NOTE — Progress Notes (Signed)
[facility] Pulmonary Specialist   Daily Progress Note( Weekend Coverage to Dr. Jacqlyn Larsen)      Patient Name: Kelli Brown, Kelli Brown  Date/Time: 01/08/19 3:48 PM  Attending Physician: Alvino Blood, MD  Location/Room: [phone]    Interval Events:   Diuresed yesterday and - 500 negative  FiO2 requirement has improved, now on 60 % saturating 97 %  Creatinine improved    Assessment:   Acute on chronic CHF  CKD  Pulmonary HTN  ILD  Acute hypoxic respiratory failure    Plan and Recommendation   Continue diuresis  Wean oxygen as tolerated  Bronchodilator  Incentive spirometry  Dr.Lessar will resume care tomorrow from pulmonary point of view    Subjective:   No cough, sob is improving  Able to get out of bed more easily.   ROS   Review of Systems   Constitutional: Positive for malaise/fatigue. Negative for chills and fever.   HENT: Negative for congestion, ear discharge and ear pain.    Eyes: Negative for double vision, photophobia and pain.   Respiratory: Positive for shortness of breath. Negative for cough, hemoptysis, sputum production and wheezing.    Cardiovascular: Negative for chest pain, palpitations, orthopnea and leg swelling.   Gastrointestinal: Negative for abdominal pain, blood in stool, diarrhea, heartburn, nausea and vomiting.   Genitourinary: Negative for dysuria and urgency.   Musculoskeletal: Negative for back pain, joint pain and neck pain.   Neurological: Negative for dizziness, sensory change, speech change, focal weakness and headaches.   Psychiatric/Behavioral: Negative for hallucinations.     Physical Exam:   Vitals: BP 99/47   Pulse 92   Temp 97.5 F (36.4 C) (Oral)   Resp 12   Ht 1.524 m (5') Comment: stated  Wt 66.7 kg (147 lb 0.8 oz)   SpO2 91%   BMI 28.72 kg/m   I/O:    Intake/Output Summary (Last 24 hours) at 01/08/2019 1548  Last data filed at 01/08/2019 1123  Gross per 24 hour   Intake 900 ml   Output 1850 ml   Net -950 ml     Vent settings: FiO2:  [60 %-80 %] 60 %    Physical  Exam  Vitals signs reviewed.   Constitutional:       General: She is not in acute distress.     Appearance: Normal appearance. She is ill-appearing.   HENT:      Head: Normocephalic and atraumatic.      Nose: Nose normal. No congestion or rhinorrhea.      Mouth/Throat:      Mouth: Mucous membranes are moist.      Pharynx: No oropharyngeal exudate or posterior oropharyngeal erythema.   Eyes:      General:         Right eye: No discharge.         Left eye: No discharge.      Extraocular Movements: Extraocular movements intact.      Pupils: Pupils are equal, round, and reactive to light.   Neck:      Musculoskeletal: Normal range of motion and neck supple. No neck rigidity.   Cardiovascular:      Rate and Rhythm: Normal rate and regular rhythm.      Pulses: Normal pulses.      Heart sounds: Murmur present.   Pulmonary:      Effort: Pulmonary effort is normal. No respiratory distress.      Breath sounds: Normal breath sounds. No stridor. No wheezing or rhonchi.  Abdominal:      General: Abdomen is flat. Bowel sounds are normal. There is no distension.      Palpations: Abdomen is soft.      Tenderness: There is no abdominal tenderness. There is no guarding.   Musculoskeletal:         General: No swelling, tenderness or deformity.   Skin:     General: Skin is warm and dry.      Capillary Refill: Capillary refill takes less than 2 seconds.   Neurological:      General: No focal deficit present.      Mental Status: She is alert and oriented to person, place, and time.   Psychiatric:         Mood and Affect: Mood normal.         Inpatient Medications     Current Medications    Scheduled     Medication Dose/Rate, Route, Frequency Last Action    albuterol (PROVENTIL) (2.5 MG/3ML) 0.083% nebulizer solution 2.5 mg 2.5 mg, NEBULIZATION, Q4H [facility] Given: 02/16 1122    aspirin EC tablet 81 mg 81 mg, PO, QAM Given: 02/16 0848    dorzolamide (TRUSOPT) 2 % ophthalmic solution 1 drop 1 drop, LEFT EYE, TID Given: 02/16 1355    famotidine  (PEPCID) tablet 10 mg 10 mg, PO, Daily Given: 02/16 0847    folic acid (FOLVITE) tablet 1 mg 1 mg, PO, Daily Given: 02/16 0848    furosemide (LASIX) injection 40 mg 40 mg, IV, Once Ordered    furosemide (LASIX) injection 40 mg 40 mg, IV, BID Given: 02/16 1355    guaiFENesin (MUCINEX) 12 hr tablet 600 mg 600 mg, PO, Q12H [facility] Given: 02/16 0848    lactobacillus species (BIO-K PLUS) capsule 50 Billion CFU 50 Billion CFU, PO, Daily Given: 02/16 0849    loratadine (CLARITIN) tablet 10 mg 10 mg, PO, Daily Given: 02/16 0847    methylPREDNISolone sodium succinate (Solu-MEDROL) injection 40 mg 40 mg, IV, Q8H Given: 02/16 0849    metoprolol succinate XL (TOPROL-XL) 24 hr tablet 50 mg 50 mg, PO, QAM Given: 02/16 0847    multivitamin (CEROVITE) tablet 1 tablet 1 tablet, PO, Daily Given: 02/16 0848    piperacillin-tazobactam (ZOSYN) 2.25 g IVPB 50 mL (premix) 2.25 g 2.25 g, IV, Q8H New Bag: 02/16 1529    potassium chloride (K-DUR,KLOR-CON) CR tablet 20 mEq 20 mEq, PO, QAM Given: 02/16 0848    pravastatin (PRAVACHOL) tablet 40 mg 40 mg, PO, QAM Given: 02/16 0849    sodium chloride (PF) 0.9 % injection 3 mL 3 mL, IV, Q8H Given: 02/16 0849    warfarin therapy placeholder (COUMADIN therapy placeholder) 1 each 1 each, XX, See Admin Instructions Ordered         PRN     Medication Dose/Rate, Route, Frequency Last Action    acetaminophen (TYLENOL) 160 MG/5ML oral solution 650 mg No Dose/Rate, PER NG TUBE, Q4H PRN See Alternative: 02/10 1147    acetaminophen (TYLENOL) suppository 650 mg No Dose/Rate, RE, Q4H PRN See Alternative: 02/10 1147    acetaminophen (TYLENOL) tablet 650 mg 650 mg, PO, Q4H PRN Given: 02/10 1147    albuterol (PROVENTIL) (2.5 MG/3ML) 0.083% nebulizer solution 2.5 mg 2.5 mg, NEBULIZATION, Q6H PRN Ordered    LORazepam (ATIVAN) tablet 0.25 mg 0.25 mg, PO, Q6H PRN Given: 02/15 2356    naloxone (NARCAN) injection 0.4 mg 0.4 mg, IV, PRN Ordered    ondansetron (ZOFRAN) injection 4 mg 4 mg, IV,  Q8H PRN Ordered    ondansetron  (ZOFRAN-ODT) disintegrating tablet 4 mg 4 mg, PO, Q8H PRN Ordered    prochlorperazine (COMPAZINE) injection 5 mg 5 mg, IV, Q4H PRN Ordered                Labs:   Interval Labs in the [facility] record were reviewed including the following:  CBC:   Recent Labs   Lab 01/07/19  0959 01/04/19  0629 01/03/19  0443   WBC 15.6* 16.5* 13.9*   RBC 3.28* 3.11* 3.27*   Hemoglobin 10.4* 9.9* 10.3*   Hematocrit 33.1* 31.1* 32.7*   MCV 101* 100 100   PLT CT 288 271 286     Chemistry:   Recent Labs   Lab 01/07/19  0959 01/05/19  1039 01/04/19  0629 01/03/19  0443 01/02/19  0512   Sodium 142 143 142 139 136   Potassium 4.3 3.8 3.7 4.0 3.7   Chloride 101 103 104 102 101   CO2 29 28 24 24 24    BUN 44* 46* 39* 31* 28*   Creatinine 1.30* 1.48* 1.44* 1.32* 1.34*   Glucose 239* 227* 183* 227* 218*   Calcium 8.7 8.8 8.9 9.2 9.0     LFTs:   Recent Labs   Lab 01/07/19  0959 01/04/19  0629   ALT 18 18   AST (SGOT) 17 23   Bilirubin, Total 1.2 0.9   Albumin 2.8* 2.6*   Alkaline Phosphatase 72 96     Cardiac Enzymes:     Coags:   Recent Labs   Lab 01/08/19  0527 01/07/19  0502 01/06/19  0441   PT INR 3.3* 3.8* 4.5*   PT 32.8* 38.1* 44.4*         Radiology / Imaging:   CXR: some improvement in aeration    Signed by: Noreene Filbert, MD   [facility] Pulmonary Specialist  [id number], Dalbert Garnet, MD

## 2019-01-08 NOTE — Progress Note - Problem Oriented Charting Notewrit (Signed)
Pharmacy Consult Warfarin Dosing  Kelli Brown    Age: 85 y.o.  Weight: 66.7 kg (147 lb 0.8 oz)  Indication: a fib  Goal INR: 2-3  Home warfarin dose =   2.5 mg daily except 5mg  Thurs and Sat  Interacting agent/disease: amiodarone, aspirin, zosyn    Subjective/Objective:   89 yof admitted from assisted living with hypoxia and lethargy. Takes warfarin as outpatient reportedly at above dosage, however it is not on patient's home med list, per facility home med list is not regularly updated    Assessment/Plan:   Vitamin K 5 mg po on 2/9   INR today 3.3 today.    Hold warfarin again tonight.    Daily INR   Monitor for signs and symptoms of bleeding   Avoid IM injections      Current warfarin dose =(hold for INR greater than 3.5)  Date 2/8 2/9 2/10 2/11 2/12 2/13 2/14 2-15 2-16   INR 8.8 9.7 1.5 1.4 2.1 3.6 4.5 3.8 3.3   dose Hold hold 5 mg 5 mg Hold Hold Hold hold hold   Hgb 11.1 10.3 9.8 10.3 9.9   10.4    Plt 259 245 247 286 271   288        Past Medical History:   Diagnosis Date    Abnormal vision     Arthritis     Atrial fibrillation     Congestive heart failure     Glaucoma     Hip fx, right, closed, initial encounter 2010    Hyperlipidemia     Hypertension     Low back pain     Macular degeneration     Nonrheumatic aortic (valve) stenosis 08/11/2017    Shingles         Recent Labs   Lab 01/07/19  0959   Bilirubin, Total 1.2   Protein, Total 5.8*   Albumin 2.8*   ALT 18   AST (SGOT) 17             If you have any questions, please contact the pharmacist at [phone].    Carmin Richmond, [facility]

## 2019-01-08 NOTE — Plan of Care (Addendum)
NURSE NOTE SUMMARY  [facility] - GI/ENDO/GEN MED   Patient Name: Kelli Brown   Attending Physician: Alvino Blood, MD   Today's date:   01/08/2019 LOS: 8 days   Shift Summary:                                                              Assumed care of the Kelli Brown at shift change. Bed alarm on. Kelli Brown a&o x4. No complaints of pain or nausea. Oxygen satuarations 96-97% on hi-flow oxygen: 50%/40 liters. Scheduled PO medications administered. Fresh water at bedside. Call bell within reach. Will continue to monitor the Kelli Brown.    01/09/19 0515: Kelli Brown has been up during the shift to the bsc and back to bed. Kelli Brown is weak and now needs assist times two with walker. Kelli Brown started to cry and stated, "I am so sick of this. I want all this to end." nurse stated, "are you tired of living?" Kelli Brown stated, "yes! I am so sick and everything came upon me so suddenly." the nurse stayed with the Kelli Brown and talked and encouraged the patient until the Kelli Brown felt better. Fresh water at bedside. Call bell within reach. Will continue to monitor the Kelli Brown.    01/09/19 0610: Kelli Brown resting quietly in bed. Bed alarm on. RT had to change continuous pulse ox machine this morning. Report to be given to oncoming nurse. Call bell within reach. Will continue to monitor the Kelli Brown.   Provider Notifications:      Rapid Response Notifications:  Mobility:      PMP Activity: Step 6 - Walks in Room (01/08/2019  9:57 PM)     Weight tracking:  Family Dynamic:   Last 3 Weights for the past 72 hrs (Last 3 readings):   Weight   01/08/19 0634 66.7 kg (147 lb 0.8 oz)   01/07/19 0326 68 kg (149 lb 14.4 oz)   01/06/19 0429 67.3 kg (148 lb 5.9 oz)             Recent Vitals Last Bowel Movement   BP: 99/48 (01/08/2019  9:37 PM)  Heart Rate: 86 (01/08/2019  9:37 PM)  Temp: 97 F (36.1 C) (01/08/2019  9:37 PM)  Resp Rate: 20 (01/08/2019  9:37 PM)  Weight: 66.7 kg (147 lb 0.8 oz) (01/08/2019  6:34 AM)  SpO2: 93 % (01/08/2019  9:37 PM)   No data recorded        Problem: Moderate/High Fall  Risk Score >5  Goal: Patient will remain free of falls  Outcome: Progressing  Flowsheets (Taken 01/08/2019 2157)  VH High Risk (Greater than 13): ALL REQUIRED LOW INTERVENTIONS;ALL REQUIRED MODERATE INTERVENTIONS;RED "HIGH FALL RISK" SIGNAGE;BED ALARM WILL BE ACTIVATED WHEN THE PATEINT IS IN BED WITH SIGNAGE "RESET BED ALARM";A CHAIR PAD ALARM WILL BE USED WHEN PATIENT IS UP SITTING IN A CHAIR;PATIENT IS TO BE SUPERVISED FOR ALL TOILETING ACTIVITIES;A safety companion may be used when deemed appropriate by the Primary RN and Clinical Administrator;Keep door open for better visibility;Include family/significant other in multidisciplinary discussion regarding plan of care as appropriate;Request Kelli Brown/OT therapy consult order from physician for patients with gait/mobility impairment;Use assistive devices;Use chair-pad alarm device     Problem: Compromised Tissue integrity  Goal: Damaged tissue is healing and protected  Outcome: Progressing  Flowsheets (  Taken 01/07/2019 1256 by Caryl Never, RN)  Damaged tissue is healing and protected : Monitor/assess Braden scale every shift;Increase activity as tolerated/progressive mobility;Reposition patient every 2 hours and as needed unless able to reposition self;Relieve pressure to bony prominences for patients at moderate and high risk;Avoid shearing injuries;Keep intact skin clean and dry;Use incontinence wipes for cleaning urine, stool and caustic drainage. Foley care as needed;Monitor external devices/tubes for correct placement to prevent pressure, friction and shearing;Encourage use of lotion/moisturizer on skin;Monitor patient's hygiene practices  Goal: Nutritional status is improving  Outcome: Progressing  Flowsheets (Taken 01/07/2019 1256 by Caryl Never, RN)  Nutritional status is improving: Assist patient with eating;Allow adequate time for meals;Include patient/patient care companion in decisions related to nutrition     Problem: Compromised  Hemodynamic Status  Goal: Vital signs and fluid balance maintained/improved  Outcome: Progressing  Flowsheets (Taken 01/07/2019 1256 by Caryl Never, RN)  Vital signs and fluid balance are maintained/improved: Position patient for maximum circulation/cardiac output;Monitor/assess vitals and hemodynamic parameters with position changes;Monitor intake and output. Notify LIP if urine output is less than 30 mL/hour.;Monitor/assess lab values and report abnormal values     Problem: Inadequate Gas Exchange  Goal: Adequate oxygenation and improved ventilation  Outcome: Progressing  Flowsheets (Taken 01/07/2019 1256 by Caryl Never, RN)  Adequate oxygenation and improved ventilation: Assess lung sounds;Monitor SpO2 and treat as needed;Provide mechanical and oxygen support to facilitate gas exchange;Position for maximum ventilatory efficiency;Teach/reinforce use of incentive spirometer 10 times per hour while awake, cough and deep breath as needed;Plan activities to conserve energy: plan rest periods;Increase activity as tolerated/progressive mobility;Consult/collaborate with Respiratory Therapy     Problem: Inadequate Airway Clearance  Goal: Normal respiratory rate/effort achieved/maintained  Outcome: Progressing  Flowsheets (Taken 01/08/2019 1417 by Caryl Never, RN)  Normal respiratory rate/effort achieved/maintained: Plan activities to conserve energy: plan rest periods

## 2019-01-08 NOTE — Plan of Care (Addendum)
NURSE NOTE SUMMARY  [facility] - GI/ENDO/GEN MED   Patient Name: Kelli Brown   Attending Physician: Alvino Blood, MD   Today's date:   01/08/2019 LOS: 8 days   Shift Summary:                                                              Assumed care of pt 0700. Pt denies pain, and shows no signs or symptoms of distress. Call bell within reach, bed alarm on and audible. Pt denies needs at this time; will continue to monitor.  Pt assisted to [facility].   Family in room with pt. Medications administered per MAR.   Provider Notifications:      Rapid Response Notifications:  Mobility:      PMP Activity: Step 6 - Walks in Room (01/08/2019  8:25 AM)     Weight tracking:  Family Dynamic:   Last 3 Weights for the past 72 hrs (Last 3 readings):   Weight   01/08/19 0634 66.7 kg (147 lb 0.8 oz)   01/07/19 0326 68 kg (149 lb 14.4 oz)   01/06/19 0429 67.3 kg (148 lb 5.9 oz)             Recent Vitals Last Bowel Movement   BP: 102/45 (01/08/2019  1:53 PM)  Heart Rate: 83 (01/08/2019  1:53 PM)  Temp: 97.9 F (36.6 C) (01/08/2019 12:10 PM)  Resp Rate: 12 (01/08/2019 12:10 PM)  Weight: 66.7 kg (147 lb 0.8 oz) (01/08/2019  6:34 AM)  SpO2: 93 % (01/08/2019 12:10 PM)   No data recorded           Problem: Moderate/High Fall Risk Score >5  Goal: Patient will remain free of falls  Outcome: Progressing  Flowsheets (Taken 01/08/2019 0825)  VH High Risk (Greater than 13): ALL REQUIRED LOW INTERVENTIONS;ALL REQUIRED MODERATE INTERVENTIONS;RED "HIGH FALL RISK" SIGNAGE;BED ALARM WILL BE ACTIVATED WHEN THE PATEINT IS IN BED WITH SIGNAGE "RESET BED ALARM";A CHAIR PAD ALARM WILL BE USED WHEN PATIENT IS UP SITTING IN A CHAIR;PATIENT IS TO BE SUPERVISED FOR ALL TOILETING ACTIVITIES;Include family/significant other in multidisciplinary discussion regarding plan of care as appropriate;Use assistive devices;Use of "STOP ask for help" sign     Problem: Compromised Tissue integrity  Goal: Nutritional status is improving  Outcome:  Progressing  Flowsheets (Taken 01/07/2019 1256)  Nutritional status is improving: Assist patient with eating;Allow adequate time for meals;Include patient/patient care companion in decisions related to nutrition     Problem: Inadequate Gas Exchange  Goal: Adequate oxygenation and improved ventilation  Outcome: Progressing  Flowsheets (Taken 01/07/2019 1256)  Adequate oxygenation and improved ventilation: Assess lung sounds;Monitor SpO2 and treat as needed;Provide mechanical and oxygen support to facilitate gas exchange;Position for maximum ventilatory efficiency;Teach/reinforce use of incentive spirometer 10 times per hour while awake, cough and deep breath as needed;Plan activities to conserve energy: plan rest periods;Increase activity as tolerated/progressive mobility;Consult/collaborate with Respiratory Therapy

## 2019-01-08 NOTE — Plan of Care (Signed)
Marland Kitchen     NURSE NOTE SUMMARY  [facility] - GI/ENDO/GEN MED   Patient Name: [facility] WHITE   Attending Physician: Alvino Blood, MD   Today's date:   01/08/2019 LOS: 8 days   Shift Summary:                                                              Assumed care of pt at 1900. Meds given per MAR. Pt tearful overnight and anxious, prn ativan given. Has no further cc. Call bell and bedside table within reach.    Provider Notifications:      Rapid Response Notifications:  Mobility:      PMP Activity: Step 6 - Walks in Room (01/07/2019  8:40 PM)     Weight tracking:  Family Dynamic:   Last 3 Weights for the past 72 hrs (Last 3 readings):   Weight   01/07/19 0326 68 kg (149 lb 14.4 oz)   01/06/19 0429 67.3 kg (148 lb 5.9 oz)   01/05/19 0331 66.8 kg (147 lb 4.8 oz)             Recent Vitals Last Bowel Movement   BP: 131/64 (01/07/2019 10:07 PM)  Heart Rate: 92 (01/07/2019 10:07 PM)  Temp: 97.2 F (36.2 C) (01/07/2019 10:07 PM)  Resp Rate: 20 (01/07/2019 10:07 PM)  Weight: 68 kg (149 lb 14.4 oz) (01/07/2019  3:26 AM)  SpO2: 96 % (01/07/2019 11:42 PM)   No data recorded     Problem: Moderate/High Fall Risk Score >5  Goal: Patient will remain free of falls  Outcome: Progressing     Problem: Compromised Tissue integrity  Goal: Damaged tissue is healing and protected  Description  Interventions:  1. Monitor/assess Braden scale every shift  2. Provide wound care per wound care algorithm  3. Reposition patient every 2 hours and as needed unless able to reposition self  4. Increase activity as tolerated/progressive mobility  5. Relieve pressure to bony prominences for patients at moderate and high risk  6. Avoid shearing injuries   7. Keep intact skin clean and dry  8. Use bath wipes, not soap and water, for daily bathing   9. Use incontinence wipes for cleaning urine, stool and caustic drainage; Foley care as needed   10. Monitor external devices/tubes for correct placement to prevent pressure, friction and shearing    11. Encourage use of lotion/moisturizer on skin  12. Monitor patient's hygiene practices  13. Consult/collaborate with wound care nurse   14. Utilize specialty bed  15. Consider placing an indwelling catheter if incontinence interferes with healing of stage 3 or 4 pressure injury  Outcome: Progressing  Goal: Nutritional status is improving  Description  Interventions:  1. Assist patient with eating   2. Allow adequate time for meals   3. Encourage patient to take dietary supplement(s) as ordered   4. Collaborate with Clinical Nutritionist  5. Include patient/patient care companion in decisions related to nutrition  Outcome: Progressing     Problem: Compromised Hemodynamic Status  Goal: Vital signs and fluid balance maintained/improved  Description  Interventions:  1. Position patient for maximum circulation / cardiac output  2. Monitor and assess vitals and hemodynamic parameters with position changes  3. Monitor and compare daily weight  4.  Monitor intake and output.  Notify LIP if urine output is less than 30 mL/hour   5. Monitor/assess lab values and report abnormal values  Outcome: Progressing     Problem: Inadequate Gas Exchange  Goal: Adequate oxygenation and improved ventilation  Description  Interventions  1. Assess lung sounds   2. Monitor SpO2 and treat as needed  3. Monitor and treat ETCO2  4. Provide mechanical and oxygen support to facilitate gas exchange  5. Position for maximum ventilatory efficiency  6. Teach/reinforce use of incentive spirometer 10 times per hour while awake, cough and deep breath as needed  7. Plan activities to conserve energy: plan rest periods  8. Increase activity as tolerated/progressive mobility  9. Consult/collaborate with Respiratory Therapy  Outcome: Progressing     Problem: Inadequate Airway Clearance  Goal: Normal respiratory rate/effort achieved/maintained  Description  Interventions:  1. Plan activities to conserve energy: plan rest periods  Outcome: Progressing

## 2019-01-08 NOTE — ACP (Advance Care Planning) (Signed)
Advance Care Planning Services    Patient Name: [facility] WHITE LOS: 8 days   Attending Physician: Alvino Blood, MD   Primary Care Physician: Flossie Dibble, MD   Narrative of Meeting   Events prompting this discussion:  Acute hypoxic respiratory failure   ILD   Acute diastolic CHF  Pneumonia/pneumonitis   Pulmonary HTN  Hospitalization for above  Advanced age of 64  CKD-3  P A fib       The types of care discussed:  Standard of care for disease, Modification of standard care, Comfort Care, Hospice Care, Resuscitation with CPR, Intubation/TracheostomyTube and Limiting future hospitalization     The Patient's/Surrogate's wishes and goals for treatment:  Had family meeting which included two grandchildren Delice Bison and [place]),   pt does not want to go on like this for long. She/family is requesting palliative care consult and would like to consider comfort care if no improvement in the next day or two.   In the meantime she would like to continue with current Mx.   Questions and concerns addressed to the best of my knowledge.        Active Problem List  Active Problems:    Pneumonia    Acute exacerbation of CHF (congestive heart failure)    Acute respiratory failure with hypoxia    Hypertension    Chronic kidney disease, stage III (moderate)    Hyperlipidemia    Hypoxia        Documents Filled Out As Part of Discussion:                None  Existing Documents that were reviewed and discussed with the patient/surrogate:  None   Acknowledgment of patient's right to decline advance care planning:  The patient has medical decision-making capacity.  The patient/surrogate was informed that this session is completely voluntary and was given the opportunity to decline the ACP services. The patient/surrogate decided to participate in the ACP session with the discussion proceeding as described above.   I have spent 40 minutes on face-to-face Advance Care Planning Services   100% of the time was spent on discussion and  counseling the patient and grandchildren.   No active management of the problems listed above was undertaken during the time period reported.       Medical decision-making capacity can be determined by patient's ability to have:  1.     Factual understanding of current situation and issues;   2.     Interpretation of the information presented.   3.     Reasoning ability about decisions/choices.  4.     Execution of voluntary decision without coercion.   Advance care planning:             Improves end of life care            Improves patient & family satisfaction            Reduces stress, anxiety, & depression in surviving relatives  Reference:  Detering et al., BMJ 2010; 340 doi: PopPath.it (Published 13 February 2009)   Alvino Blood, MD  01/08/19 5:50 PM  MRN: [id number]                                       CSN: [id number]  DOB: [birth year]

## 2019-01-08 NOTE — Progress Notes (Signed)
PROGRESS NOTE - [facility]    Date Time: 01/08/19 2:56 PM  Patient Name: [facility] WHITE  Attending Physician: Alvino Blood, MD    Assessment and Plan                                                       [facility]       Acute hypoxic respiratory failurelikelydue to pneumonia/diastolic heart failure  Probable multifactorial including interstitial lung disease, pulmonary hypertension, CHF, pneumonia/pneumonitis, ???  Amiodarone pulmonary toxicity  Continue HFNC at 40 L and 60% Fio2  Continue respiratory treatment diuresis.  Patient discussed with Dr. Saunders Glance     Pneumonia/ Pneumonitis  CXR/CT: consistent with multifocal pneumonia/pneumonitis  Repeat imaging unchanged  Off vancomycin  Continue IV Zosyn for now  Blood cx NGTD  Continue IV Solu-Medrol and respiratory treatment.  Mucinex and claritin    Acute on chronic diastolic heart failure/moderate aortic stenosis  Will put on IV Lasix 40 mg p.o. twice daily  Strict I's/O, daily weights  Echo: LVH, stable aortic stenosis, moderate tricuspid regurg, moderate to severe pulmonary artery pressure    CKD stage III  Creatinine stable    Supra therapeutic INR, resolved  INR 3.3  Coumadin per Pharm.D.  [place] amiodarone    Mild anemia due to CKD  Better H&H    Paroxysmal atrial fibrillation  Continue metoprolol  INR therapeutic  Coumadin per Pharm.D.    HTN (hypertension):   controlled, continue metoprolol with hold parameters    Hyperlipidemia  Continue with home meds    Had a family meeting with 2 granddaughters and patient.  Patient is getting tired of fighting and considering palliative care.  She has asked for palliative care consult and would like to consider comfort care in a day or 2 if no improvement.    Subjective                                                                          [employer]       Tired of fighting     Review of Systems:                                                                  [employer]       All other systems were reviewed and are negative except: as above       Physical Exam:                                                                    [employer]  Temp:  [96.8 F (36 C)-98.2 F (36.8 C)] 97.9 F (36.6 C)  Heart Rate:  [81-92] 83  Resp Rate:  [12-24] 12  BP: (102-131)/(45-64) 102/45  FiO2:  [60 %-80 %] 60 %    Intake/Output Summary (Last 24 hours) at 01/08/2019 1456  Last data filed at 01/08/2019 1123  Gross per 24 hour   Intake 900 ml   Output 1850 ml   Net -950 ml       General: awake, alert, oriented x 3; no acute distress.  HEENT: perrla, eomi, sclera anicteric  oropharynx clear without lesions, mucous membranes moist  Glands: No cervical or axillary lymphadenopathy.  Neck: supple, no lymphadenopathy, no thyromegaly, no JVD, no carotid bruits  Cardiovascular: regular rate and rhythm, no rubs or gallops  Lungs: Decreased air entry with basilar crepitant  Abdomen: soft, non-tender, non-distended; no palpable masses, no hepatosplenomegaly, normoactive bowel sounds, no rebound or guarding  Extremities: no clubbing, cyanosis, or edema  Neuro: cranial nerves grossly intact, strength 5/5 in upper and lower extremities, sensation intact,   Psych: Normal affect, not depressed   Skin: no rashes or lesions noted      Meds:                                                                                  [facility]       Current Facility-Administered Medications   Medication Dose Route Frequency Provider Last Rate Last Dose    acetaminophen (TYLENOL) tablet 650 mg  650 mg Oral Q4H PRN Eben Burow, MD   650 mg at 01/02/19 1147    Or    acetaminophen (TYLENOL) 160 MG/5ML oral solution 650 mg  650 mg per NG tube Q4H PRN Eben Burow, MD        Or    acetaminophen (TYLENOL) suppository 650 mg  650 mg Rectal Q4H PRN Eben Burow, MD        albuterol (PROVENTIL) (2.5 MG/3ML) 0.083% nebulizer solution 2.5 mg  2.5 mg Nebulization Q4H SCH Selvam, Jeyandra,  MD   2.5 mg at 01/08/19 1122    albuterol (PROVENTIL) (2.5 MG/3ML) 0.083% nebulizer solution 2.5 mg  2.5 mg Nebulization Q6H PRN Eben Burow, MD        amiodarone (PACERONE) tablet 100 mg  100 mg Oral Daily Selvam, Eda Paschal, MD   100 mg at 01/08/19 0848    aspirin EC tablet 81 mg  81 mg Oral QAM Selvam, Eda Paschal, MD   81 mg at 01/08/19 0848    dorzolamide (TRUSOPT) 2 % ophthalmic solution 1 drop  1 drop Left Eye TID Eben Burow, MD   1 drop at 01/08/19 1355    famotidine (PEPCID) tablet 10 mg  10 mg Oral Daily Selvam, Eda Paschal, MD   10 mg at 01/08/19 0847    folic acid (FOLVITE) tablet 1 mg  1 mg Oral Daily Selvam, Eda Paschal, MD   1 mg at 01/08/19 0848    furosemide (LASIX) injection 40 mg  40 mg Intravenous BID Alvino Blood, MD   40 mg at 01/08/19 1355    guaiFENesin (MUCINEX) 12 hr tablet 600 mg  600 mg Oral Q12H SCH Venia Carbon,  MD   600 mg at 01/08/19 0848    lactobacillus species (BIO-K PLUS) capsule 50 Billion CFU  50 Billion CFU Oral Daily Eben Burow, MD   50 Billion CFU at 01/08/19 0849    loratadine (CLARITIN) tablet 10 mg  10 mg Oral Daily Venia Carbon, MD   10 mg at 01/08/19 0847    LORazepam (ATIVAN) tablet 0.25 mg  0.25 mg Oral Q6H PRN Eben Burow, MD   0.25 mg at 01/07/19 2356    methylPREDNISolone sodium succinate (Solu-MEDROL) injection 40 mg  40 mg Intravenous Q8H Eben Burow, MD   40 mg at 01/08/19 0849    metoprolol succinate XL (TOPROL-XL) 24 hr tablet 50 mg  50 mg Oral QAM Eben Burow, MD   50 mg at 01/08/19 0847    multivitamin (CEROVITE) tablet 1 tablet  1 tablet Oral Daily Alvino Blood, MD   1 tablet at 01/08/19 0848    naloxone [facility]) injection 0.4 mg  0.4 mg Intravenous PRN Eben Burow, MD        ondansetron (ZOFRAN-ODT) disintegrating tablet 4 mg  4 mg Oral Q8H PRN Eben Burow, MD        Or    ondansetron (ZOFRAN) injection 4 mg  4 mg Intravenous Q8H PRN Selvam, Eda Paschal, MD        piperacillin-tazobactam  (ZOSYN) 2.25 g IVPB 50 mL (premix) 2.25 g  2.25 g Intravenous Q8H Venia Carbon, MD 100 mL/hr at 01/08/19 0847 2.25 g at 01/08/19 0847    potassium chloride (K-DUR,KLOR-CON) CR tablet 20 mEq  20 mEq Oral QAM Eben Burow, MD   20 mEq at 01/08/19 0848    pravastatin (PRAVACHOL) tablet 40 mg  40 mg Oral QAM Eben Burow, MD   40 mg at 01/08/19 0849    prochlorperazine (COMPAZINE) injection 5 mg  5 mg Intravenous Q4H PRN Eben Burow, MD        sodium chloride (PF) 0.9 % injection 3 mL  3 mL Intravenous Q8H Eben Burow, MD   3 mL at 01/08/19 0849    warfarin therapy placeholder (COUMADIN therapy placeholder) 1 each  1 each Does not apply See Admin Instructions Eben Burow, MD           Labs and Imaging:                                                              [facility]       RECENT LABS (from the last 7 days)  Recent Labs   Lab 01/07/19  0959 01/04/19  0629   WBC 15.6* 16.5*   RBC 3.28* 3.11*   Hemoglobin 10.4* 9.9*   Hematocrit 33.1* 31.1*   MCV 101* 100   PLT CT 288 271     Recent Labs   Lab 01/08/19  0527 01/07/19  0502 01/06/19  0441   PT 32.8* 38.1* 44.4*   PT INR 3.3* 3.8* 4.5*        Recent Labs   Lab 01/07/19  0959 01/05/19  1039   Glucose 239* 227*   Sodium 142 143   Potassium 4.3 3.8   Chloride 101 103   CO2 29 28   BUN 44* 46*   Creatinine 1.30* 1.48*   EGFR 34* 29*  Calcium 8.7 8.8         Recent Labs   Lab 01/07/19  0959 01/04/19  0629   Albumin 2.8* 2.6*   Protein, Total 5.8* 6.3   Bilirubin, Total 1.2 0.9   Alkaline Phosphatase 72 96   ALT 18 18   AST (SGOT) 17 23          Invalid input(s):  AMORPHOUSUA   No results found for: [id number]         Microbiology, reviewed and are significant for:  Microbiology Results     Procedure Component Value Units Date/Time    Blood Culture [[id number]] Collected:  01/02/19 1300    Specimen:  Blood from Venipuncture Updated:  01/07/19 1313    Narrative:       Specimen/Source: Blood/Venipuncture  Collected: 01/02/2019  13:00     Status: Final      Last Updated: 01/07/2019 13:10                Culture Result (Final)      No Growth in 5 Days          Blood Culture [[id number]] Collected:  01/02/19 1300    Specimen:  Blood from Venipuncture Updated:  01/07/19 1313    Narrative:       Specimen/Source: Blood/Venipuncture  Collected: 01/02/2019 13:00     Status: Final      Last Updated: 01/07/2019 13:10                Culture Result (Final)      No Growth in 5 Days          Influenza A / B Rapid Test [[id number]] Collected:  12/31/18 1108    Specimen:  Nasal Wash Updated:  12/31/18 1143     Influenza A Negative     Influenza B Negative     Comment: Method: Jarvis Morgan    The sensitivity for this method is between 90% and 95% for Influenza A and around 90% for Influenza B. The specificity for both Influenza A and B is around 96%. False positive results may occur, especially when the prevalence of Influenza activity is low. This is more likely with Influenza B due to its lower prevalence. Clinical conditions, including the prevalence of influenza activity, should be considered in the interpretation of results. If clinically indicated, results may be confirmed with PCR testing.  The above 2 analytes were performed by [facility] Main Lab [phone])  [address] [address]         Narrative:       Influenza A antigen detection tests are unable to distinquish between novel and seasonal influenza A.    A negative result for either Influenza A or B antigen does not exclude influenza virus infection. Clinical correlation required.    All positive influenza antigen tests (A or B) require placement of patient on droplet precaution isolation.    Nares MRSA Detection by DNA Amplification [[id number]] Collected:  01/04/19 0224    Specimen:  Nares Updated:  01/04/19 0415     MRSA No MRSA detected.     Comment: The above 1 analytes were performed by [facility] Main Lab [phone])  [address]  [address]               Imaging:  Reviewed      Disposition:  [facility]            Today's date: 01/08/2019   Length of Stay: 8    Signed by: Alvino Blood, MD  Pager # [phone]  Phone # [phone]    Total time spent with the patient/family, counseling and/or coordination of care with other physicians, other health care professionals is 35 minutes.   I have addressed the principle problem (listed above), the active hospital problems (listed above), discharge planning issues, the results of radiology tests, the results of laboratory tests and medications (current therapy and side effects).   Greater than 50% of the time was spent counseling and coordinating care.    [facility], [place]  [address]  [place], [place]    This note was generated within an electronic medical record, and portions of it may have been completed with voice recognition software. Typographical, word substitution, pronoun and other language errors may occur. If there are substantial concerns about the content of this note that may affect patient care, please contact the author for clarification.

## 2019-01-09 ENCOUNTER — Inpatient Hospital Stay: Payer: Medicare Other

## 2019-01-09 LAB — PT/INR
PT INR: 2.5 — ABNORMAL HIGH (ref 0.5–1.3)
PT: 25.4 s — ABNORMAL HIGH (ref 9.5–11.5)

## 2019-01-09 LAB — CBC AND DIFFERENTIAL
Bands: 3 % (ref 0–10)
Basophils %: 0 % (ref 0.0–3.0)
Basophils Absolute: 0 10*3/uL (ref 0.0–0.3)
Eosinophils %: 0 % (ref 0.0–7.0)
Eosinophils Absolute: 0 10*3/uL (ref 0.0–0.8)
Hematocrit: 33.4 % — ABNORMAL LOW (ref 36.0–48.0)
Hemoglobin: 10.3 gm/dL — ABNORMAL LOW (ref 12.0–16.0)
Lymphocytes Absolute: 0.3 10*3/uL — ABNORMAL LOW (ref 0.6–5.1)
Lymphocytes: 2 % — ABNORMAL LOW (ref 15.0–46.0)
MCH: 31 pg (ref 28–35)
MCHC: 31 gm/dL — ABNORMAL LOW (ref 32–36)
MCV: 101 fL — ABNORMAL HIGH (ref 80–100)
MPV: 7.4 fL (ref 6.0–10.0)
Metamyelocytes: 2 % — ABNORMAL HIGH (ref 0–0)
Monocytes Absolute: 0.7 10*3/uL (ref 0.1–1.7)
Monocytes: 4 % (ref 3.0–15.0)
Myelocytes: 2 % — ABNORMAL HIGH (ref 0–0)
Neutrophils %: 86 % — ABNORMAL HIGH (ref 42.0–78.0)
Neutrophils Absolute: 15.3 10*3/uL — ABNORMAL HIGH (ref 1.7–8.6)
Other Cells:: 1 % — ABNORMAL HIGH (ref 0–0)
PLT CT: 289 10*3/uL (ref 130–440)
RBC: 3.29 10*6/uL — ABNORMAL LOW (ref 3.80–5.00)
RDW: 17.4 % — ABNORMAL HIGH (ref 11.0–14.0)
WBC: 16.5 10*3/uL — ABNORMAL HIGH (ref 4.0–11.0)

## 2019-01-09 LAB — BASIC METABOLIC PANEL
Anion Gap: 17 mMol/L (ref 7.0–18.0)
BUN / Creatinine Ratio: 30.8 Ratio — ABNORMAL HIGH (ref 10.0–30.0)
BUN: 44 mg/dL — ABNORMAL HIGH (ref 7–22)
CO2: 30.8 mMol/L — ABNORMAL HIGH (ref 20.0–30.0)
Calcium: 8.8 mg/dL (ref 8.5–10.5)
Chloride: 98 mMol/L (ref 98–110)
Creatinine: 1.43 mg/dL — ABNORMAL HIGH (ref 0.60–1.20)
EGFR: 31 mL/min/{1.73_m2} — ABNORMAL LOW (ref 60–150)
Glucose: 206 mg/dL — ABNORMAL HIGH (ref 71–99)
Osmolality Calculated: 300 mOsm/kg (ref 275–300)
Potassium: 3.8 mMol/L (ref 3.5–5.3)
Sodium: 142 mMol/L (ref 136–147)

## 2019-01-09 MED ORDER — WARFARIN SODIUM 2.5 MG PO TABS
2.50 mg | ORAL_TABLET | Freq: Every day | ORAL | Status: AC
Start: 2019-01-09 — End: 2019-01-09
  Administered 2019-01-09: 18:00:00 2.5 mg via ORAL
  Filled 2019-01-09: qty 1

## 2019-01-09 MED ORDER — VH WARFARIN THERAPY PLACEHOLDER
1.00 | Status: DC
Start: 2019-01-09 — End: 2019-01-18

## 2019-01-09 MED ORDER — VH WARFARIN PROTOCOL PLACEHOLDER - MODIFIED
1.00 | Status: DC
Start: 2019-01-09 — End: 2019-01-11

## 2019-01-09 NOTE — Progress Notes (Signed)
Medicine Progress Note - [facility]      Date Time: 01/09/19 9:11 AM  Patient Name: Kelli Brown  Attending Physician: Alvino Blood, MD    Assessment:                                                                                        Active Problems:    Pneumonia    Acute exacerbation of CHF (congestive heart failure)    Acute respiratory failure with hypoxia    Hypertension    Chronic kidney disease, stage III (moderate)    Hyperlipidemia    Hypoxia        Plan:                                                                                                      Acute hypoxic respiratory failurelikelydue to pneumonia/diastolic heart failure  Probable multifactorial including interstitial lung disease, pulmonary hypertension, CHF, pneumonia/pneumonitis, possible amiodarone toxicity  Now slowly weaning  O2 requirement: FNC at 30 L and 40% Fio2  Continue respiratory treatment diuresis.  Pulmonary following, appreciate recs  As patient is improving, will off on palliative care consult for now    Pneumonia/ Pneumonitis  CXR/CT: consistent with multifocal pneumonia/pneumonitis  Repeat imaging now with slow improvement, now with improved aeration  Off vancomycin  Continue IV Zosyn for now  Blood cx NGTD  Continue IV Solu-Medrol and respiratory treatment.  Mucinex and claritin    Acute on chronic diastolic heart failure/moderate aortic stenosis  Will put on IV Lasix 40 mg. twice daily  Strict I's/O, daily weights  Echo: LVH, stable aortic stenosis, moderate tricuspid regurg, moderate to severe pulmonary artery pressure    CKD stage III  Creatinine stable    Supra therapeutic INR, resolved  INR 3.3  Coumadin per Pharm.D.    Mild anemia due to CKD  Better H&H    Paroxysmal atrial fibrillation  Continue metoprolol  Discontinued amiodarone  INR therapeutic  Coumadin per Pharm.D.    HTN (hypertension):   controlled, continue metoprolol with hold parameters     Hyperlipidemia  Continue with home meds    DVT ppx: coumadin per pharm  Lines:  Patient Lines/Drains/Airways Status    Active PICC Line / CVC Line / PIV Line / Drain / Airway / Intraosseous Line / Epidural Line / ART Line / Line / Wound / Pressure Ulcer / NG/OG Tube     Name:   Placement date:   Placement time:   Site:   Days:    Peripheral IV 01/01/19 Right Forearm   01/01/19    0030    Forearm   8                Disposition:                                                                                             Today's date: 01/09/2019  Length of Stay: 9  Code Status: NO CPR - SUPPORT OK  Anticipated medical stability for discharge in : 3 days    Subjective                                                                                             CC: <principal problem not specified>     Patient reports feeling a little better. Granddaughter Delice Bison at bedside. Patient requesting to get out of bed today.       Review of Systems:                                                                               Review of Systems   Constitutional: Negative for chills and fever.   HENT: Negative for congestion and sore throat.    Respiratory: Negative for cough and shortness of breath.    Cardiovascular: Negative for chest pain and leg swelling.   Gastrointestinal: Negative for abdominal pain, diarrhea, nausea and vomiting.   Genitourinary: Negative for dysuria, frequency and urgency.   Musculoskeletal: Negative for arthralgias and myalgias.   Skin: Negative for pallor and rash.   Neurological: Negative for dizziness and headaches.       Physical Exam:  Temp:  [97 F (36.1 C)-98.4 F (36.9 C)] 97.5 F (36.4 C)  Heart Rate:  [83-92] 84  Resp Rate:  [12-20] 20  BP: (99-131)/(45-58) 131/58  FiO2:  [40 %-60 %] 40 %    Intake/Output Summary  (Last 24 hours) at 01/09/2019 0911  Last data filed at 01/09/2019 0546  Gross per 24 hour   Intake 1210 ml   Output 1300 ml   Net -90 ml       General: Awake, alert, oriented x 3; No acute distress.  HEENT: EOMI, Sclera anicteric  Oropharynx clear without lesions, mucous membranes moist  Glands: No cervical or axillary lymphadenopathy.  Neck: Supple, no lymphadenopathy, no thyromegaly  Cardiovascular: systolic ejection murmur, regular rate and rhythm   Lungs: CTAB, without wheezes, rhonchi, or rales  Abdomen: Soft, non-tender, non-distended; no palpable masses, normoactive bowel sounds, no rebound or guarding  Extremities: No clubbing, cyanosis, or edema  Neuro: Cranial nerves grossly intact, strength 5/5 in upper and lower extremities, sensation intact  Psych: Normal affect, not depressed   Skin: No rashes or lesions noted    Meds:                                                                                                       Medications were reviewed in the electronic record: [x]       Estimated Creatinine Clearance: 19 mL/min (A) (based on SCr of 1.43 mg/dL (H)).  Current Facility-Administered Medications   Medication Dose Route Frequency   . albuterol  2.5 mg Nebulization Q4H SCH   . aspirin EC  81 mg Oral QAM   . dorzolamide  1 drop Left Eye TID   . famotidine  10 mg Oral Daily   . folic acid  1 mg Oral Daily   . furosemide  40 mg Intravenous BID   . guaiFENesin  600 mg Oral Q12H SCH   . lactobacillus species  50 Billion CFU Oral Daily   . loratadine  10 mg Oral Daily   . methylPREDNISolone  40 mg Intravenous Q8H   . metoprolol succinate XL  50 mg Oral QAM   . multivitamin  1 tablet Oral Daily   . piperacillin-tazobactam  2.25 g Intravenous Q8H   . potassium chloride  20 mEq Oral QAM   . pravastatin  40 mg Oral QAM   . sodium chloride (PF)  3 mL Intravenous Q8H   . warfarin therapy placeholder  1 each Does not apply See Admin Instructions     PRN medications: acetaminophen **OR** acetaminophen **OR**  acetaminophen, albuterol, LORazepam, naloxone, ondansetron **OR** ondansetron, prochlorperazine  IV Drips:       Labs and Imaging:  Results     Procedure Component Value Units Date/Time    CBC and differential [[id number]]  (Abnormal) Collected:  01/09/19 0528    Specimen:  Blood Updated:  01/09/19 0756     WBC 16.5 K/cmm      RBC 3.29 M/cmm      Hemoglobin 10.3 gm/dL      Hematocrit 40.9 %      MCV 101 fL      MCH 31 pg      MCHC 31 gm/dL      RDW 81.1 %      PLT CT 289 K/cmm      MPV 7.4 fL      NEUTROPHIL % 86.0 %      Lymphocytes 2.0 %      Monocytes 4.0 %      Eosinophils % 0.0 %      Basophils % 0.0 %      Bands 3 %      Metamyelocytes 2 %      Myelocytes 2 %      Other Cells: 1 %      Neutrophils Absolute 15.3 K/cmm      Lymphocytes Absolute 0.3 K/cmm      Monocytes Absolute 0.7 K/cmm      Eosinophils Absolute 0.0 K/cmm      BASO Absolute 0.0 K/cmm      RBC Morphology RBC Morphology Reviewed     Anisocytosis 1+     Hypochromia 1+     Elliptocytes 1+     Target Cells 1+     Tear Drop Cells 1+    Narrative:       Manual differential performed    Basic Metabolic Panel [[id number]]  (Abnormal) Collected:  01/09/19 0528    Specimen:  Plasma Updated:  01/09/19 0615     Sodium 142 mMol/L      Potassium 3.8 mMol/L      Chloride 98 mMol/L      CO2 30.8 mMol/L      Calcium 8.8 mg/dL      Glucose 213 mg/dL      Creatinine 0.86 mg/dL      BUN 44 mg/dL      BUN/Creatinine Ratio 30.8 Ratio      Anion Gap 17.0 mMol/L      EGFR 31 mL/min/1.71m2      Osmolality Calculated 300 mOsm/kg     Prothrombin time/INR [[id number]]  (Abnormal) Collected:  01/09/19 0528    Specimen:  Blood Updated:  01/09/19 0614     PT 25.4 sec      PT INR 2.5          Microbiology, reviewed and are significant for:  Microbiology Results     Procedure Component Value Units Date/Time    Blood Culture [[id number]] Collected:  01/02/19 1300    Specimen:  Blood from Venipuncture  Updated:  01/07/19 1313    Narrative:       Specimen/Source: Blood/Venipuncture  Collected: 01/02/2019 13:00     Status: Final      Last Updated: 01/07/2019 13:10                Culture Result (Final)      No Growth in 5 Days          Blood Culture [[id number]] Collected:  01/02/19 1300    Specimen:  Blood from Venipuncture Updated:  01/07/19 1313    Narrative:       Specimen/Source: Blood/Venipuncture  Collected:  01/02/2019 13:00     Status: Final      Last Updated: 01/07/2019 13:10                Culture Result (Final)      No Growth in 5 Days          Influenza A / B Rapid Test [[id number]] Collected:  12/31/18 1108    Specimen:  Nasal Wash Updated:  12/31/18 1143     Influenza A Negative     Influenza B Negative     Comment: Method: Jarvis Morgan    The sensitivity for this method is between 90% and 95% for Influenza A and around 90% for Influenza B. The specificity for both Influenza A and B is around 96%. False positive results may occur, especially when the prevalence of Influenza activity is low. This is more likely with Influenza B due to its lower prevalence. Clinical conditions, including the prevalence of influenza activity, should be considered in the interpretation of results. If clinically indicated, results may be confirmed with PCR testing.  The above 2 analytes were performed by [facility] [phone])  [address] [address]         Narrative:       Influenza A antigen detection tests are unable to distinquish between novel and seasonal influenza A.    A negative result for either Influenza A or B antigen does not exclude influenza virus infection. Clinical correlation required.    All positive influenza antigen tests (A or B) require placement of patient on droplet precaution isolation.    Nares MRSA Detection by DNA Amplification [[id number]] Collected:  01/04/19 0224    Specimen:  Nares Updated:  01/04/19 0415     MRSA No MRSA detected.     Comment: The above 1  analytes were performed by [facility] Main Lab [phone])  [address],[place] [address]               Imaging, reviewed and are significant for:  Radiology Results (24 Hour)     Procedure Component Value Units Date/Time    XR Chest AP Portable [[id number]] Resulted:  01/09/19 2440    Order Status:  Sent Updated:  01/09/19 0819    XR Chest AP Portable [[id number]] Collected:  01/08/19 1531    Order Status:  Completed Updated:  01/08/19 1535    Narrative:       Clinical History:  SOB    Ordering Comments:   None.      Study Notes:   Shortness of breath with dyspnea. History of hypertension and congestive heart failure.      Examination:  AP portable semierect chest 01/08/2019 at 10:04 AM.    Comparison:  Prior radiographs, most recent 01/06/2019.    Findings:  There is slightly improved aeration of each lung. Coarse reticular densities remain, with relative sparing at the right base. There are calcified granulomata in the right lung, and a noncalcified somewhat nodular density projecting above the right   hemidiaphragm. There is persistent blunting of each costophrenic angle. There is no pneumothorax. There is diffuse osteopenia. Vertebral body heights are not well assessed.      Impression:       1.  Persistent widespread airspace opacity, with improved aeration since February 14.  2.  Somewhat nodular density at the right base, not well-seen on the previous exam. Follow-up to radiographic resolution suggested.  3.  Pleural effusions, decreased in size.  ReadingStation:[id number]            Signed:  Darral Dash, MD   SVRP R1, [phone]

## 2019-01-09 NOTE — Progress Note - Problem Oriented Charting Notewrit (Signed)
Pharmacy Consult Warfarin Dosing  Kelli Brown    Age: 85 y.o.  Weight: 65.8 kg (145 lb)  Indication: a fib  Goal INR: 2-3  Home warfarin dose =   2.5 mg daily except 5mg  Thurs and Sat  Interacting agent/disease: amiodarone, aspirin, methylprednisolone, Zosyn    Subjective/Objective:   [name] admitted from assisted living with hypoxia and lethargy. Takes warfarin as outpatient reportedly at above dosage, however it is not on patient's home med list, per facility home med list is not regularly updated    Assessment   Vitamin K 5 mg po on 2/9   INR today 2.5 today.  Patient has previously had a dramatic response to warfarin compared to her reported home dose in the setting of a CHF exacerbation.  Patient has been on steroids since admission which can dramatically affect INR.    H&H and platelets are stable.      Plan   Patient will be switched to per protocol dosing due to her profound response to warfarin in the setting of a CHF exacerbation and steroid use.   Restart warfarin per Level I protocol (modified)   Daily INR   Monitor for signs and symptoms of bleeding   Avoid IM injections    Warfarin Level 1 Algorithm (modified):  INR 1.4 or less give 5 mg and call MD,   INR 1.5-1.9 give 5 mg and call MD,  INR 2-3 give 2.5 mg,  INR 3.1-3.5 give 1 mg,  INR greater than 3.5 HOLD    Current warfarin dose =(hold for INR greater than 3.5)  Date 2/8 2/9 2/10 2/11 2/12 2/13 2/14 2-15 2-16 2/17     INR 8.8 9.7 1.5 1.4 2.1 3.6 4.5 3.8 3.3 2.5     dose Hold hold 5 mg 5 mg Hold Hold Hold hold hold 2.5mg      Hgb 11.1 10.3 9.8 10.3 9.9   10.4  10.3     Plt 259 245 247 286 271   288  289         Past Medical History:   Diagnosis Date    Abnormal vision     Arthritis     Atrial fibrillation     Congestive heart failure     Glaucoma     Hip fx, right, closed, initial encounter 2010    Hyperlipidemia     Hypertension     Low back pain     Macular degeneration     Nonrheumatic aortic (valve) stenosis 08/11/2017     Shingles         Recent Labs   Lab 01/07/19  0959   Bilirubin, Total 1.2   Protein, Total 5.8*   Albumin 2.8*   ALT 18   AST (SGOT) 17             If you have any questions, please contact the pharmacist at [phone].    Judie Petit, PharmD

## 2019-01-09 NOTE — Plan of Care (Addendum)
NURSE NOTE SUMMARY  [facility] - GI/ENDO/GEN MED   Patient Name: Kelli Brown   Attending Physician: Alvino Blood, MD   Today's date:   01/09/2019 LOS: 9 days   Shift Summary:                                                              0700-Assumed care of the patient. Patient alert and oriented x4. Patient assessment complete. Patient medicated per MAR. Patient currently on 40% 30 liters Hi Flow with continuous pulse ox. Patient has no complaints of pain. Patient takes pills crushed in applesauce. Patient educated on call bell; bed alarm set. Will continue to monitor.    Provider Notifications:      Rapid Response Notifications:  Mobility:      PMP Activity: Step 6 - Walks in Room (01/09/2019  8:00 AM)     Weight tracking:  Family Dynamic:   Last 3 Weights for the past 72 hrs (Last 3 readings):   Weight   01/09/19 0500 65.8 kg (145 lb)   01/08/19 0634 66.7 kg (147 lb 0.8 oz)   01/07/19 0326 68 kg (149 lb 14.4 oz)             Recent Vitals Last Bowel Movement   BP: 103/48 (01/09/2019  3:44 PM)  Heart Rate: 88 (01/09/2019  3:44 PM)  Temp: 97.5 F (36.4 C) (01/09/2019  3:44 PM)  Resp Rate: 20 (01/09/2019  3:44 PM)  Weight: 65.8 kg (145 lb) (01/09/2019  5:00 AM)  SpO2: 90 % (01/09/2019  3:46 PM)   Last BM Date: 01/08/19       Problem: Moderate/High Fall Risk Score >5  Description  Fall Risk Score > 5  Goal: Patient will remain free of falls  Outcome: Progressing     Problem: Compromised Tissue integrity  Goal: Damaged tissue is healing and protected  Description  Interventions:  1. Monitor/assess Braden scale every shift  2. Provide wound care per wound care algorithm  3. Reposition patient every 2 hours and as needed unless able to reposition self  4. Increase activity as tolerated/progressive mobility  5. Relieve pressure to bony prominences for patients at moderate and high risk  6. Avoid shearing injuries   7. Keep intact skin clean and dry  8. Use bath wipes, not soap and water, for daily  bathing   9. Use incontinence wipes for cleaning urine, stool and caustic drainage; Foley care as needed   10. Monitor external devices/tubes for correct placement to prevent pressure, friction and shearing   11. Encourage use of lotion/moisturizer on skin  12. Monitor patient's hygiene practices  13. Consult/collaborate with wound care nurse   14. Utilize specialty bed  15. Consider placing an indwelling catheter if incontinence interferes with healing of stage 3 or 4 pressure injury  Outcome: Progressing     Problem: Compromised Tissue integrity  Goal: Nutritional status is improving  Description  Interventions:  1. Assist patient with eating   2. Allow adequate time for meals   3. Encourage patient to take dietary supplement(s) as ordered   4. Collaborate with Clinical Nutritionist  5. Include patient/patient care companion in decisions related to nutrition  Outcome: Progressing     Problem: Compromised Hemodynamic Status  Goal: Vital signs  and fluid balance maintained/improved  Description  Interventions:  1. Position patient for maximum circulation / cardiac output  2. Monitor and assess vitals and hemodynamic parameters with position changes  3. Monitor and compare daily weight  4. Monitor intake and output.  Notify LIP if urine output is less than 30 mL/hour   5. Monitor/assess lab values and report abnormal values  Outcome: Progressing     Problem: Inadequate Gas Exchange  Goal: Adequate oxygenation and improved ventilation  Description  Interventions  1. Assess lung sounds   2. Monitor SpO2 and treat as needed  3. Monitor and treat ETCO2  4. Provide mechanical and oxygen support to facilitate gas exchange  5. Position for maximum ventilatory efficiency  6. Teach/reinforce use of incentive spirometer 10 times per hour while awake, cough and deep breath as needed  7. Plan activities to conserve energy: plan rest periods  8. Increase activity as tolerated/progressive mobility  9. Consult/collaborate with  Respiratory Therapy  Outcome: Progressing     Problem: Inadequate Airway Clearance  Goal: Normal respiratory rate/effort achieved/maintained  Description  Interventions:  1. Plan activities to conserve energy: plan rest periods  Outcome: Progressing

## 2019-01-09 NOTE — Progress Notes (Signed)
Quick Doc  [facility] - GI/ENDO/GEN MED   Patient Name: [facility] WHITE   Attending Physician: Alvino Blood, MD   Today's date:   01/09/2019 LOS: 9 days   Expected Discharge Date      Quick  Assessment:                                                              ReAdmit Risk Score: 25    CM Comments: (P) 01/09/19 RNCM: PHolt. ADM: acute hypoxic resp failure, PNA, dCHF, cont to wean HFHC 40L/40%, met w/granddtrs and pt on 2/16-per MD will hold off on palliative care consult due to improvement in ability to wean O2.  PTA: Dossie Arbour in [place], uses rollator.  Anticipate that the pt will return to [facility]  RNCM will cont to follow for discharge needs.                                                                                        Provider Notifications:        Vanice Sarah. Leonor Liv, RN MSN  Case Management  Ph: [phone]  Fax: [phone]

## 2019-01-09 NOTE — UM Notes (Signed)
[facility] Utilization Management Review Sheet    Facility :  [facility] ([facility])    NAME: Kelli Brown               MR#: [id number]  DOB: 1919/10/22  CSN#: [id number]  ROOM: 528/528-A      AGE: 85 y.o.    ADMIT DATE AND TIME: 12/31/2018 10:50 AM  PATIENT CLASS: INPATIENT 12/31/18 1429    DATE OF REVIEW: 01/07/2019    MEDICINE:   Assessment:                                                                                        Active Problems:    Pneumonia    Acute exacerbation of CHF (congestive heart failure)    Acute respiratory failure with hypoxia    Hypertension    Chronic kidney disease, stage III (moderate)    Hyperlipidemia    Plan:                                                                                                      Acute hypoxic respiratory failurelikelydue to pneumonia/diastolic heart failure  Continues to requireHFNC40L/min FiO2 of90%, unable to wean  Dr. Jacqlyn Larsen (pulm) consulted, appreciate recommendations  Maintain oxygen saturation>90%    Pneumonia/ Pneumonitis  CXR/CT: consistent with multifocal pneumonia/pneumonitis  Repeat imaging unchanged  MRSA nares neg, will discontinue vanc  Continue Zosyn 2.25 g q8h (day 6)  Blood cx NGTD  Solumederol 40 mg q8h  Mucinex and claritin    Acute on chronic diastolic heart failure/moderate aortic stenosis  Lasix 20 mg PO BID  Strict I's/O, daily weights  Echo: LVH, stable aortic stenosis, moderate tricuspid regurg, moderate to severe pulmonary artery pressure    CKD stage III  Baseline: 1.3  Monitor while on lasix    Supra therapeutic INR, resolved  Pharmacy to dose warfarin  Added multivitamin  INR improving    Mild anemia due to CKD  Monitor h/H    Paroxysmal atrial fibrillation  Amiodarone100mg   Metoprolol50mg   Pharmacy to dosewarfarin    Hypertension controlled  Metoprolol50mg     Hyperlipidemia  Statin40mg     DVT ZDG:UYQIHKVQ    VITAL SIGNS: T 98.50F HR 82 BP 109/52 RR 20 SAT 80-90%    LABS: WBC 15.6; HGB 10.4/HCT  33.1; RBC 3.28; GLU 239; BUN 44; CREAT 1.3;     PULMONOLOGY:  Interval Events:   Minimally able to wean Fio2 to 80 % from 90% HFNC    Assessment:   Acute on chronic CHF  CKD  Pulmonary HTN  ILD  Acute hypoxic respiratory failure    Plan and Recommendation   Anticipating to transition care to palliative care  Continue steroid  Increase diuresis to IV lasix  Subjective:   Feeling ok,denies cough or sputum production.   Have not been able to get out of bed due to dyspnea    Trevor Iha, RN, BSN  Utilization Management  [facility]  [address]  [place], [place] [address]  Work: [phone]  Fax: [phone]  [email or web address]

## 2019-01-09 NOTE — Progress Notes (Signed)
PROGRESS NOTE    Date Time: 01/09/19 8:45 AM  Patient Name: Kelli Brown, Kelli Brown  MRN:  [id number]    Assessment/Plan:   Active Problems:    Pneumonia    Acute exacerbation of CHF (congestive heart failure)    Acute respiratory failure with hypoxia    Hypertension    Chronic kidney disease, stage III (moderate)    Hyperlipidemia    Hypoxia    1.  Acute on chronic hypoxic respiratory failure-Slow improvement.  Now on 30 L 40%.  Pulse at 88% or higher  2.  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  Diuresis as tolerated.  3.  Interstitial lung disease-on Solu-Medrol    Subjective:   Feels better.  No fevers or chills  The following portions of the patient's history were reviewed and updated as appropriate: allergies and current medications.    Physical Exam:     Vitals:    01/09/19 0751   BP: 131/58   Pulse: 84   Resp: 20   Temp: 97.5 F (36.4 C)   SpO2: 94%     Focused Exam:  General appearance - oriented to person, place, and time and chronically ill appearing  Mental status - alert, oriented to person, place, and time, normal mood, behavior, speech, dress, motor activity, and thought processes  Eyes - pupils equal and reactive, extraocular eye movements intact, sclera anicteric  Mouth - mucous membranes moist, pharynx normal without lesions  Neck -   Lymphatics -   Chest - no tachypnea, retractions or cyanosis, decreased air entry noted   Heart - normal rate, regular rhythm, normal S1, S2, no murmurs, rubs, clicks or gallops  Abdomen - soft, nontender, nondistended, no masses or organomegaly  Neurological - alert, oriented, normal speech, no focal findings or movement disorder noted, neck supple without rigidity, cranial nerves II through XII intact  Musculoskeletal -   Extremities - peripheral pulses normal, no pedal edema, no clubbing or cyanosis  Skin -     Labs:     Results     Procedure Component Value Units Date/Time    CBC and differential [[id number]]  (Abnormal) Collected:  01/09/19 0528     Specimen:  Blood Updated:  01/09/19 0756     WBC 16.5 K/cmm      RBC 3.29 M/cmm      Hemoglobin 10.3 gm/dL      Hematocrit 47.8 %      MCV 101 fL      MCH 31 pg      MCHC 31 gm/dL      RDW 29.5 %      PLT CT 289 K/cmm      MPV 7.4 fL      NEUTROPHIL % 86.0 %      Lymphocytes 2.0 %      Monocytes 4.0 %      Eosinophils % 0.0 %      Basophils % 0.0 %      Bands 3 %      Metamyelocytes 2 %      Myelocytes 2 %      Other Cells: 1 %      Neutrophils Absolute 15.3 K/cmm      Lymphocytes Absolute 0.3 K/cmm      Monocytes Absolute 0.7 K/cmm      Eosinophils Absolute 0.0 K/cmm      BASO Absolute 0.0 K/cmm      RBC Morphology RBC Morphology Reviewed     Anisocytosis 1+  Hypochromia 1+     Elliptocytes 1+     Target Cells 1+     Tear Drop Cells 1+    Narrative:       Manual differential performed    Basic Metabolic Panel [[id number]]  (Abnormal) Collected:  01/09/19 0528    Specimen:  Plasma Updated:  01/09/19 0615     Sodium 142 mMol/L      Potassium 3.8 mMol/L      Chloride 98 mMol/L      CO2 30.8 mMol/L      Calcium 8.8 mg/dL      Glucose 914 mg/dL      Creatinine 7.82 mg/dL      BUN 44 mg/dL      BUN/Creatinine Ratio 30.8 Ratio      Anion Gap 17.0 mMol/L      EGFR 31 mL/min/1.85m2      Osmolality Calculated 300 mOsm/kg     Prothrombin time/INR [[id number]]  (Abnormal) Collected:  01/09/19 0528    Specimen:  Blood Updated:  01/09/19 0614     PT 25.4 sec      PT INR 2.5        Recent Labs   Lab 01/09/19  0528 01/07/19  0959 01/04/19  0629   Eosinophils Absolute 0.0 0.0 0.0       Rads:     Radiology Results (24 Hour)     Procedure Component Value Units Date/Time    XR Chest AP Portable [[id number]] Resulted:  01/09/19 5284    Order Status:  Sent Updated:  01/09/19 0819    XR Chest AP Portable [[id number]] Collected:  01/08/19 1531    Order Status:  Completed Updated:  01/08/19 1535    Narrative:       Clinical History:  SOB    Ordering Comments:   None.      Study Notes:   Shortness of breath with dyspnea. History of  hypertension and congestive heart failure.      Examination:  AP portable semierect chest 01/08/2019 at 10:04 AM.    Comparison:  Prior radiographs, most recent 01/06/2019.    Findings:  There is slightly improved aeration of each lung. Coarse reticular densities remain, with relative sparing at the right base. There are calcified granulomata in the right lung, and a noncalcified somewhat nodular density projecting above the right   hemidiaphragm. There is persistent blunting of each costophrenic angle. There is no pneumothorax. There is diffuse osteopenia. Vertebral body heights are not well assessed.      Impression:       1.  Persistent widespread airspace opacity, with improved aeration since February 14.  2.  Somewhat nodular density at the right base, not well-seen on the previous exam. Follow-up to radiographic resolution suggested.  3.  Pleural effusions, decreased in size.    ReadingStation:[id number]              Signed by: Teresita Madura, MD  Service:  @SERVICE @  Time/Date:  8:45 AM2/17/2020

## 2019-01-09 NOTE — Progress Notes (Signed)
Palliative Care Social Work         Referral for Pal. Care consult received from Dr. Darin Engels. Came to patient's room this morning and met in hall with patient's granddaughter, Delice Bison, while patient was receiving personal care in her room. Delice Bison indicated that she and patient may wish to defer a consult visit for now, as it is their understanding that patient is improving and not in need of our services at this time. Provided overview of our role and how we may assist at any stage of a progressive illness, with potential recommendations for symptom management and help with both short-term and longer-term care planning. Delice Bison requested that I speak with attending Dr. Darin Engels first, prior to making a visit. Provided our brochure to Delice Bison and explained we would be happy to visit in the future should they wish. Delice Bison is returning home to [place] this afternoon). Updated RNCM P. Holt as well.    Collaborated with attending D. Darin Engels who subsequently spoke with patient/family. He confirmed that we should defer a visit until further notice.     Carolin Coy, [place], ACHP-SW  Palliative Care Social Worker  [phone] or Cortext

## 2019-01-09 NOTE — Respiratory Progress Note (Signed)
RESPIRATORY SERVICES CARE PLAN   Patient Name: [facility] WHITE   Attending Physician: Alvino Blood, MD   Today's date:    01/09/2019 LOS: 9 days   CURRENT THERAPY  WEANING ATTEMPT           OXYGEN THERAPY INCREASED REQUREMENTS THIS SHIFT   SpO2: 95 % (01/09/2019  4:00 AM)  O2 Device: HFNC (01/09/2019  4:00 AM)  FiO2: (S) 40 % (01/09/2019  4:00 AM)  O2 Flow Rate (L/min): 40 L/min (01/09/2019  4:00 AM)                LAST ABG AIRWAY                     RECOMMENDATIONS/COMMENTS:                                                                  weaned pt from 50L to 40L on 40 % with sats in mid to low 90's

## 2019-01-10 LAB — PT/INR
PT INR: 2 — ABNORMAL HIGH (ref 0.5–1.3)
PT: 20.7 s — ABNORMAL HIGH (ref 9.5–11.5)

## 2019-01-10 MED ORDER — WARFARIN SODIUM 2.5 MG PO TABS
2.50 mg | ORAL_TABLET | Freq: Every day | ORAL | Status: AC
Start: 2019-01-10 — End: 2019-01-10
  Administered 2019-01-10: 17:00:00 2.5 mg via ORAL
  Filled 2019-01-10: qty 1

## 2019-01-10 MED ORDER — METHYLPREDNISOLONE SODIUM SUCC 40 MG IJ SOLR
40.00 mg | Freq: Two times a day (BID) | INTRAMUSCULAR | Status: DC
Start: 2019-01-10 — End: 2019-01-12
  Administered 2019-01-10 – 2019-01-12 (×4): 40 mg via INTRAVENOUS
  Filled 2019-01-10 (×6): qty 1

## 2019-01-10 MED ORDER — ALBUTEROL SULFATE (2.5 MG/3ML) 0.083% IN NEBU
2.50 mg | INHALATION_SOLUTION | Freq: Four times a day (QID) | RESPIRATORY_TRACT | Status: DC
Start: 2019-01-11 — End: 2019-01-13
  Administered 2019-01-11 – 2019-01-13 (×8): 2.5 mg via RESPIRATORY_TRACT
  Filled 2019-01-10 (×12): qty 3

## 2019-01-10 NOTE — Progress Notes (Signed)
Medicine Progress Note - [facility]      Date Time: 01/10/19 6:24 AM  Patient Name: Kelli Brown  Attending Physician: Alvino Blood, MD    Assessment:                                                                                        Active Problems:    Pneumonia    Acute exacerbation of CHF (congestive heart failure)    Acute respiratory failure with hypoxia    Hypertension    Chronic kidney disease, stage III (moderate)    Hyperlipidemia    Hypoxia        Plan:                                                                                                      Acute hypoxic respiratory failurelikelydue to pneumonia/diastolic heart failure  Probable multifactorial including interstitial lung disease, pulmonary hypertension, CHF, pneumonia/pneumonitis, possible amiodarone toxicity  Discontinue HFNC  Discontinue zosyn  Solumedrol 40 mg q12h  Continue respiratory treatment diuresis.  Pulmonary following, appreciate recs    Pneumonia/ Pneumonitis  CXR/CT: consistent with multifocal pneumonia/pneumonitis  Repeat imaging with slow improvement, now with improved aeration  Will repeat again tomorrow   S/o zosyn x 7 days  Blood cx negative  Incentive spirometry   Mucinex and claritin    Acute on chronic diastolic heart failure/moderate aortic stenosis  IV Lasix 40 mg. twice daily  Strict I's/O, daily weights  Echo: LVH, stable aortic stenosis, moderate tricuspid regurg, moderate to severe pulmonary artery pressure    CKD stage III  Creatinine stable    Supra therapeutic INR, resolved  INR 3.3  Coumadin per Pharm.D.    Mild anemia due to CKD  Better H&H    Paroxysmal atrial fibrillation  Metoprolol  Discontinued amiodarone  INR therapeutic  Coumadin per Pharm.D.    HTN(hypertension):   Metoprolol    Hyperlipidemia  Continue with home meds    DVT ppx: coumadin - pharmacy to dose  Lines:                                                                                                        Patient Lines/Drains/Airways Status    Active PICC Line / CVC Line /  PIV Line / Drain / Airway / Intraosseous Line / Epidural Line / ART Line / Line / Wound / Pressure Ulcer / NG/OG Tube     Name:   Placement date:   Placement time:   Site:   Days:    Peripheral IV 01/01/19 Right Forearm   01/01/19    0030    Forearm   9                Disposition:                                                                                             Today's date: 01/10/2019  Length of Stay: 10  Code Status: NO CPR - SUPPORT OK  Anticipated medical stability for discharge in : 2 days    Subjective                                                                                             CC: <principal problem not specified>     Patient feels well. States she doesn't sleep well here. Denies feeling short of breath, chest pain, swelling, or discomfort. Requesting to get in a chair today.       Review of Systems:                                                                               Review of Systems   Constitutional: Negative for chills and fever.   HENT: Negative for congestion and sore throat.    Respiratory: Negative for cough and shortness of breath.    Cardiovascular: Negative for chest pain and leg swelling.   Gastrointestinal: Negative for abdominal pain, diarrhea, nausea and vomiting.   Genitourinary: Negative for dysuria, frequency and urgency.   Musculoskeletal: Negative for arthralgias and myalgias.   Skin: Negative for pallor and rash.   Neurological: Negative for dizziness and headaches.       Physical Exam:                                                                                     Temp:  [97.3 F (  36.3 C)-98.2 F (36.8 C)] 98.1 F (36.7 C)  Heart Rate:  [84-104] 87  Resp Rate:  [20] 20  BP: (102-139)/(48-65) 137/65  FiO2:  [30 %-40 %] 40 %    Intake/Output Summary (Last 24 hours) at 01/10/2019 0624  Last data filed at 01/10/2019 0547  Gross per 24 hour   Intake 1200  ml   Output 1300 ml   Net -100 ml       General: Awake, alert, oriented x 3; No acute distress.  HEENT: EOMI, Sclera anicteric  Oropharynx clear without lesions, mucous membranes moist  Glands: No cervical or axillary lymphadenopathy.  Neck: Supple, no lymphadenopathy, no thyromegaly  Cardiovascular: systolic murmur heard at the R sternal border  Lungs: CTAB, without wheezes, rhonchi, or rales  Abdomen: Soft, non-tender, non-distended; no palpable masses, normoactive bowel sounds, no rebound or guarding  Extremities: No clubbing, cyanosis, or edema  Neuro: Cranial nerves grossly intact, strength 5/5 in upper and lower extremities, sensation intact  Psych: Normal affect, not depressed   Skin: No rashes or lesions noted    Meds:                                                                                                       Medications were reviewed in the electronic record: [x]       Estimated Creatinine Clearance: 19 mL/min (A) (based on SCr of 1.43 mg/dL (H)).  Current Facility-Administered Medications   Medication Dose Route Frequency   . albuterol  2.5 mg Nebulization Q4H SCH   . aspirin EC  81 mg Oral QAM   . dorzolamide  1 drop Left Eye TID   . famotidine  10 mg Oral Daily   . folic acid  1 mg Oral Daily   . furosemide  40 mg Intravenous BID   . guaiFENesin  600 mg Oral Q12H SCH   . lactobacillus species  50 Billion CFU Oral Daily   . loratadine  10 mg Oral Daily   . methylPREDNISolone  40 mg Intravenous Q8H   . metoprolol succinate XL  50 mg Oral QAM   . multivitamin  1 tablet Oral Daily   . piperacillin-tazobactam  2.25 g Intravenous Q8H   . potassium chloride  20 mEq Oral QAM   . pravastatin  40 mg Oral QAM   . sodium chloride (PF)  3 mL Intravenous Q8H   . warfarin modified protocol placeholder  1 each Does not apply See Admin Instructions   . warfarin therapy placeholder  1 each Does not apply See Admin Instructions     PRN medications: acetaminophen **OR** acetaminophen **OR** acetaminophen,  albuterol, LORazepam, naloxone, ondansetron **OR** ondansetron, prochlorperazine  IV Drips:       Labs and Imaging:  Results     Procedure Component Value Units Date/Time    Prothrombin time/INR [[id number]]  (Abnormal) Collected:  01/10/19 0505    Specimen:  Blood Updated:  01/10/19 0604     PT 20.7 sec      PT INR 2.0    CBC and differential [[id number]]  (Abnormal) Collected:  01/09/19 0528    Specimen:  Blood Updated:  01/09/19 0756     WBC 16.5 K/cmm      RBC 3.29 M/cmm      Hemoglobin 10.3 gm/dL      Hematocrit 56.2 %      MCV 101 fL      MCH 31 pg      MCHC 31 gm/dL      RDW 13.0 %      PLT CT 289 K/cmm      MPV 7.4 fL      NEUTROPHIL % 86.0 %      Lymphocytes 2.0 %      Monocytes 4.0 %      Eosinophils % 0.0 %      Basophils % 0.0 %      Bands 3 %      Metamyelocytes 2 %      Myelocytes 2 %      Other Cells: 1 %      Neutrophils Absolute 15.3 K/cmm      Lymphocytes Absolute 0.3 K/cmm      Monocytes Absolute 0.7 K/cmm      Eosinophils Absolute 0.0 K/cmm      BASO Absolute 0.0 K/cmm      RBC Morphology RBC Morphology Reviewed     Anisocytosis 1+     Hypochromia 1+     Elliptocytes 1+     Target Cells 1+     Tear Drop Cells 1+    Narrative:       Manual differential performed          Microbiology, reviewed and are significant for:  Microbiology Results     Procedure Component Value Units Date/Time    Blood Culture [[id number]] Collected:  01/02/19 1300    Specimen:  Blood from Venipuncture Updated:  01/07/19 1313    Narrative:       Specimen/Source: Blood/Venipuncture  Collected: 01/02/2019 13:00     Status: Final      Last Updated: 01/07/2019 13:10                Culture Result (Final)      No Growth in 5 Days          Blood Culture [[id number]] Collected:  01/02/19 1300    Specimen:  Blood from Venipuncture Updated:  01/07/19 1313    Narrative:       Specimen/Source: Blood/Venipuncture  Collected: 01/02/2019 13:00     Status: Final       Last Updated: 01/07/2019 13:10                Culture Result (Final)      No Growth in 5 Days          Influenza A / B Rapid Test [[id number]] Collected:  12/31/18 1108    Specimen:  Nasal Wash Updated:  12/31/18 1143     Influenza A Negative     Influenza B Negative     Comment: Method: Jarvis Morgan    The sensitivity for this method is between 90% and 95% for Influenza A and around 90% for Influenza B. The specificity  for both Influenza A and B is around 96%. False positive results may occur, especially when the prevalence of Influenza activity is low. This is more likely with Influenza B due to its lower prevalence. Clinical conditions, including the prevalence of influenza activity, should be considered in the interpretation of results. If clinically indicated, results may be confirmed with PCR testing.  The above 2 analytes were performed by [facility] [phone])  [address] [address]         Narrative:       Influenza A antigen detection tests are unable to distinquish between novel and seasonal influenza A.    A negative result for either Influenza A or B antigen does not exclude influenza virus infection. Clinical correlation required.    All positive influenza antigen tests (A or B) require placement of patient on droplet precaution isolation.    Nares MRSA Detection by DNA Amplification [[id number]] Collected:  01/04/19 0224    Specimen:  Nares Updated:  01/04/19 0415     MRSA No MRSA detected.     Comment: The above 1 analytes were performed by [facility] [phone])  [address],[place] [address]               Imaging, reviewed and are significant for:  Radiology Results (24 Hour)     Procedure Component Value Units Date/Time    XR Chest AP Portable [[id number]] Collected:  01/09/19 0948    Order Status:  Completed Updated:  01/09/19 0951    Narrative:       Clinical History:  pneumonia    Examination:  XR CHEST AP PORTABLE    Comparison:   January 08, 2019    Technique:  Portable AP    Findings:  Heart is normal in size.  Extensive bilateral infiltrates are unchanged.  There is right costophrenic angle blunting.  Left costophrenic angle are sharp.      Impression:       1. Bilateral infiltrates are unchanged.  2. Small right pleural effusion.    ReadingStation:[id number]            Signed:  Darral Dash, MD   SVRP R1, [phone]

## 2019-01-10 NOTE — Plan of Care (Signed)
NURSE NOTE SUMMARY  [facility] - GI/ENDO/GEN MED   Patient Name: Kelli Brown   Attending Physician: Alvino Blood, MD   Today's date:   01/10/2019 LOS: 10 days   Shift Summary:                                                              Assumed care 1900, pt reports no pain, medicated per MAR, on HIFlow 40% 30L oxygen. Continuous pulse ox. Call bell within reach, bed in  low position, no needs expressed. On 1800 FR   Provider Notifications:      Rapid Response Notifications:  Mobility:      PMP Activity: Step 6 - Walks in Room (01/09/2019  7:23 PM)     Weight tracking:  Family Dynamic:   Last 3 Weights for the past 72 hrs (Last 3 readings):   Weight   01/09/19 0500 65.8 kg (145 lb)   01/08/19 0634 66.7 kg (147 lb 0.8 oz)   01/07/19 0326 68 kg (149 lb 14.4 oz)             Recent Vitals Last Bowel Movement   BP: 102/48 (01/09/2019 10:20 PM)  Heart Rate: 90 (01/09/2019 10:20 PM)  Temp: 97.3 F (36.3 C) (01/09/2019 10:20 PM)  Resp Rate: 20 (01/09/2019 10:20 PM)  Weight: 65.8 kg (145 lb) (01/09/2019  5:00 AM)  SpO2: 97 % (01/09/2019 11:15 PM)   Last BM Date: 01/08/19         Problem: Moderate/High Fall Risk Score >5  Goal: Patient will remain free of falls  Outcome: Progressing  Flowsheets (Taken 01/01/2019 2130 by Clover Mealy, RN)  VH Moderate Risk (6-13): ALL REQUIRED LOW INTERVENTIONS;INITIATE YELLOW "FALL RISK" SIGNAGE;YELLOW NON-SKID SLIPPERS;YELLOW "FALL RISK" ARM BAND;USE OF BED EXIT ALARM IF PATIENT IS CONFUSED OR IMPULSIVE. PLACE RESET BED ALARM SIGN ABOVE BED;PLACE FALL RISK LEVEL ON Brown BOARD FOR COMMUNICATION PURPOSES IN PATIENT'S ROOM;Include family/significant other in multidisciplinary discussion regarding plan of care as appropriate;Remain with patient during toileting     Problem: Compromised Tissue integrity  Goal: Damaged tissue is healing and protected  Outcome: Progressing  Flowsheets (Taken 01/07/2019 1256 by Caryl Never, RN)  Damaged tissue is healing and  protected : Monitor/assess Braden scale every shift;Increase activity as tolerated/progressive mobility;Reposition patient every 2 hours and as needed unless able to reposition self;Relieve pressure to bony prominences for patients at moderate and high risk;Avoid shearing injuries;Keep intact skin clean and dry;Use incontinence wipes for cleaning urine, stool and caustic drainage. Foley care as needed;Monitor external devices/tubes for correct placement to prevent pressure, friction and shearing;Encourage use of lotion/moisturizer on skin;Monitor patient's hygiene practices  Goal: Nutritional status is improving  Outcome: Progressing  Flowsheets (Taken 01/07/2019 1256 by Caryl Never, RN)  Nutritional status is improving: Assist patient with eating;Allow adequate time for meals;Include patient/patient care companion in decisions related to nutrition     Problem: Compromised Hemodynamic Status  Goal: Vital signs and fluid balance maintained/improved  Outcome: Progressing  Flowsheets (Taken 01/07/2019 1256 by Caryl Never, RN)  Vital signs and fluid balance are maintained/improved: Position patient for maximum circulation/cardiac output;Monitor/assess vitals and hemodynamic parameters with position changes;Monitor intake and output. Notify LIP if urine output is less than 30 mL/hour.;Monitor/assess lab values and report  abnormal values     Problem: Inadequate Gas Exchange  Goal: Adequate oxygenation and improved ventilation  Outcome: Progressing  Flowsheets (Taken 01/07/2019 1256 by Caryl Never, RN)  Adequate oxygenation and improved ventilation: Assess lung sounds;Monitor SpO2 and treat as needed;Provide mechanical and oxygen support to facilitate gas exchange;Position for maximum ventilatory efficiency;Teach/reinforce use of incentive spirometer 10 times per hour while awake, cough and deep breath as needed;Plan activities to conserve energy: plan rest periods;Increase activity as  tolerated/progressive mobility;Consult/collaborate with Respiratory Therapy     Problem: Inadequate Airway Clearance  Goal: Normal respiratory rate/effort achieved/maintained  Outcome: Progressing  Flowsheets (Taken 01/08/2019 1417 by Caryl Never, RN)  Normal respiratory rate/effort achieved/maintained: Plan activities to conserve energy: plan rest periods

## 2019-01-10 NOTE — Progress Notes (Signed)
See Pal Care SW note from 01/09/2019.   Will sign off.   Please reconsult if Palliative Care needs arise and/or family interested in services that we can provide. Thank you.

## 2019-01-10 NOTE — Plan of Care (Addendum)
NURSE NOTE SUMMARY  [facility] - GI/ENDO/GEN MED   Patient Name: Kelli Brown   Attending Physician: Alvino Blood, MD   Today's date:   01/10/2019 LOS: 10 days   Shift Summary:                                                              Assumed care 1900, pt on 8% oxidizer and continuous pulse Ox- 90s-100. Medicated per [facility], pt reports no pain, call bell within reach, bed in low position, alarm on. On 1800 FR, pt compliant.   2340- pt sleeping, respirations noted.  0200- has apneic episodes when sleeping, will drop to 60s, but go back to 90s when woken up- lowest 68  - respiratory called about mask alternative-on pt- Sats @ 100  0600- pt had bowel movement in bed, cleaned and resting in bed   Provider Notifications:      Rapid Response Notifications:  Mobility:      PMP Activity: Step 6 - Walks in Room (01/10/2019  7:39 PM)     Weight tracking:  Family Dynamic:   Last 3 Weights for the past 72 hrs (Last 3 readings):   Weight   01/10/19 0548 65.5 kg (144 lb 6.4 oz)   01/09/19 0500 65.8 kg (145 lb)   01/08/19 0634 66.7 kg (147 lb 0.8 oz)             Recent Vitals Last Bowel Movement   BP: 113/51 (01/10/2019 11:16 PM)  Heart Rate: 82 (01/10/2019 11:16 PM)  Temp: 97.3 F (36.3 C) (01/10/2019 11:16 PM)  Resp Rate: 18 (01/10/2019 11:16 PM)  Weight: 65.5 kg (144 lb 6.4 oz) (01/10/2019  5:48 AM)  SpO2: 98 % (01/10/2019 11:16 PM)   Last BM Date: 01/09/19         Problem: Moderate/High Fall Risk Score >5  Goal: Patient will remain free of falls  Outcome: Progressing  Flowsheets (Taken 01/10/2019 1939)  VH Moderate Risk (6-13): ALL REQUIRED LOW INTERVENTIONS;INITIATE YELLOW "FALL RISK" SIGNAGE;YELLOW NON-SKID SLIPPERS;YELLOW "FALL RISK" ARM BAND;USE OF BED EXIT ALARM IF PATIENT IS CONFUSED OR IMPULSIVE. PLACE RESET BED ALARM SIGN ABOVE BED;PLACE FALL RISK LEVEL ON Brown BOARD FOR COMMUNICATION PURPOSES IN PATIENT'S ROOM     Problem: Compromised Tissue integrity  Goal: Damaged tissue is healing and  protected  Outcome: Progressing  Flowsheets (Taken 01/07/2019 1256 by Caryl Never, RN)  Damaged tissue is healing and protected : Monitor/assess Braden scale every shift;Increase activity as tolerated/progressive mobility;Reposition patient every 2 hours and as needed unless able to reposition self;Relieve pressure to bony prominences for patients at moderate and high risk;Avoid shearing injuries;Keep intact skin clean and dry;Use incontinence wipes for cleaning urine, stool and caustic drainage. Foley care as needed;Monitor external devices/tubes for correct placement to prevent pressure, friction and shearing;Encourage use of lotion/moisturizer on skin;Monitor patient's hygiene practices  Goal: Nutritional status is improving  Outcome: Progressing  Flowsheets (Taken 01/07/2019 1256 by Caryl Never, RN)  Nutritional status is improving: Assist patient with eating;Allow adequate time for meals;Include patient/patient care companion in decisions related to nutrition     Problem: Compromised Hemodynamic Status  Goal: Vital signs and fluid balance maintained/improved  Outcome: Progressing  Flowsheets (Taken 01/07/2019 1256 by Caryl Never, RN)  Vital signs  and fluid balance are maintained/improved: Position patient for maximum circulation/cardiac output;Monitor/assess vitals and hemodynamic parameters with position changes;Monitor intake and output. Notify LIP if urine output is less than 30 mL/hour.;Monitor/assess lab values and report abnormal values     Problem: Inadequate Gas Exchange  Goal: Adequate oxygenation and improved ventilation  Outcome: Progressing  Flowsheets (Taken 01/07/2019 1256 by Caryl Never, RN)  Adequate oxygenation and improved ventilation: Assess lung sounds;Monitor SpO2 and treat as needed;Provide mechanical and oxygen support to facilitate gas exchange;Position for maximum ventilatory efficiency;Teach/reinforce use of incentive spirometer 10 times per  hour while awake, cough and deep breath as needed;Plan activities to conserve energy: plan rest periods;Increase activity as tolerated/progressive mobility;Consult/collaborate with Respiratory Therapy     Problem: Inadequate Airway Clearance  Goal: Normal respiratory rate/effort achieved/maintained  Outcome: Progressing  Flowsheets (Taken 01/08/2019 1417 by Caryl Never, RN)  Normal respiratory rate/effort achieved/maintained: Plan activities to conserve energy: plan rest periods

## 2019-01-10 NOTE — Progress Notes (Signed)
PROGRESS NOTE    Date Time: 01/10/19 8:12 AM  Patient Name: Kelli Brown, Kelli Brown  MRN:  [id number]    Assessment/Plan:   Active Problems:    Pneumonia    Acute exacerbation of CHF (congestive heart failure)    Acute respiratory failure with hypoxia    Hypertension    Chronic kidney disease, stage III (moderate)    Hyperlipidemia    Hypoxia    1. hypoxia0 improving; consider change to oximizer for ease of weaning  2. ILD- on solumedrol; improved o2 requirement; cxr unchanged; consider d/c zosyn  3> pHTN- - secondary to lung dz; continue diuresis    Subjective:   Feels better.  No fever.  Minimal cough  The following portions of the patient's history were reviewed and updated as appropriate: allergies and current medications.    Physical Exam:     Vitals:    01/10/19 0807   BP:    Pulse:    Resp:    Temp:    SpO2: 97%   reviewed under flowsheets  Focused Exam:  General appearance - oriented to person, place, and time and acyanotic, in no respiratory distress  Mental status - alert, oriented to person, place, and time  Eyes - pupils equal and reactive, extraocular eye movements intact  Mouth -   Neck - supple, no significant adenopathy  Lymphatics -   Chest - no tachypnea, retractions or cyanosis, decreased air entry noted   Heart - normal rate and regular rhythm, murmur noted  Abdomen - soft, nontender, nondistended, no masses or organomegaly  Neurological - alert, oriented, normal speech, no focal findings or movement disorder noted, neck supple without rigidity, cranial nerves II through XII intact  Musculoskeletal -   Extremities - peripheral pulses normal, no pedal edema, no clubbing or cyanosis  Skin -     Labs:     Results     Procedure Component Value Units Date/Time    Prothrombin time/INR [[id number]]  (Abnormal) Collected:  01/10/19 0505    Specimen:  Blood Updated:  01/10/19 0604     PT 20.7 sec      PT INR 2.0        Recent Labs   Lab 01/09/19  0528 01/07/19  0959 01/04/19  0629   Eosinophils Absolute 0.0  0.0 0.0       Rads:     Radiology Results (24 Hour)     Procedure Component Value Units Date/Time    XR Chest AP Portable [[id number]] Collected:  01/09/19 0948    Order Status:  Completed Updated:  01/09/19 0951    Narrative:       Clinical History:  pneumonia    Examination:  XR CHEST AP PORTABLE    Comparison:  January 08, 2019    Technique:  Portable AP    Findings:  Heart is normal in size.  Extensive bilateral infiltrates are unchanged.  There is right costophrenic angle blunting.  Left costophrenic angle are sharp.      Impression:       1. Bilateral infiltrates are unchanged.  2. Small right pleural effusion.    ReadingStation:[id number]              Signed by: Teresita Madura, MD  Service:  @SERVICE @  Time/Date:  8:12 AM2/18/2020

## 2019-01-10 NOTE — Plan of Care (Addendum)
NURSE NOTE SUMMARY  [facility] - GI/ENDO/GEN MED   Patient Name: Kelli Brown   Attending Physician: Alvino Blood, MD   Today's date:   01/10/2019 LOS: 10 days   Shift Summary:                                                              0700-Assumed care of the patient. Pulmonologist  at bedside; recommends patient be weaned to 12% oxidizer. RT called.  Patient currently at 35% 20 liters with continuous pulse ox at 95%. Patient medicated per MAR. Patient bathed and sitting in chair for meals. Patient denies any pain. Patient educated on call bell; bed alarm set. RT will continue to wean patient off Hi flow. Patient is tolerating current settings. Will continue to monitor.      1800-Patient currently on 8% oxidizer; patient tolerating well pulse ox at 100 %     Provider Notifications:      Rapid Response Notifications:  Mobility:      PMP Activity: Step 6 - Walks in Room (01/09/2019  7:23 PM)     Weight tracking:  Family Dynamic:   Last 3 Weights for the past 72 hrs (Last 3 readings):   Weight   01/10/19 0548 65.5 kg (144 lb 6.4 oz)   01/09/19 0500 65.8 kg (145 lb)   01/08/19 0634 66.7 kg (147 lb 0.8 oz)             Recent Vitals Last Bowel Movement   BP: 137/65 (01/10/2019  3:00 AM)  Heart Rate: 87 (01/10/2019  3:00 AM)  Temp: 98.1 F (36.7 C) (01/10/2019  3:00 AM)  Resp Rate: 20 (01/10/2019  3:00 AM)  Weight: 65.5 kg (144 lb 6.4 oz) (01/10/2019  5:48 AM)  SpO2: 94 % (01/10/2019  3:00 AM)   Last BM Date: 01/09/19       Problem: Moderate/High Fall Risk Score >5  Description  Fall Risk Score > 5  Goal: Patient will remain free of falls  Outcome: Progressing     Problem: Compromised Tissue integrity  Goal: Damaged tissue is healing and protected  Description  Interventions:  1. Monitor/assess Braden scale every shift  2. Provide wound care per wound care algorithm  3. Reposition patient every 2 hours and as needed unless able to reposition self  4. Increase activity as tolerated/progressive  mobility  5. Relieve pressure to bony prominences for patients at moderate and high risk  6. Avoid shearing injuries   7. Keep intact skin clean and dry  8. Use bath wipes, not soap and water, for daily bathing   9. Use incontinence wipes for cleaning urine, stool and caustic drainage; Foley care as needed   10. Monitor external devices/tubes for correct placement to prevent pressure, friction and shearing   11. Encourage use of lotion/moisturizer on skin  12. Monitor patient's hygiene practices  13. Consult/collaborate with wound care nurse   14. Utilize specialty bed  15. Consider placing an indwelling catheter if incontinence interferes with healing of stage 3 or 4 pressure injury  Outcome: Progressing     Problem: Compromised Tissue integrity  Goal: Nutritional status is improving  Description  Interventions:  1. Assist patient with eating   2. Allow adequate time for meals   3.  Encourage patient to take dietary supplement(s) as ordered   4. Collaborate with Clinical Nutritionist  5. Include patient/patient care companion in decisions related to nutrition  Outcome: Progressing     Problem: Compromised Hemodynamic Status  Goal: Vital signs and fluid balance maintained/improved  Description  Interventions:  1. Position patient for maximum circulation / cardiac output  2. Monitor and assess vitals and hemodynamic parameters with position changes  3. Monitor and compare daily weight  4. Monitor intake and output.  Notify LIP if urine output is less than 30 mL/hour   5. Monitor/assess lab values and report abnormal values  Outcome: Progressing     Problem: Inadequate Gas Exchange  Goal: Adequate oxygenation and improved ventilation  Description  Interventions  1. Assess lung sounds   2. Monitor SpO2 and treat as needed  3. Monitor and treat ETCO2  4. Provide mechanical and oxygen support to facilitate gas exchange  5. Position for maximum ventilatory efficiency  6. Teach/reinforce use of incentive spirometer 10 times  per hour while awake, cough and deep breath as needed  7. Plan activities to conserve energy: plan rest periods  8. Increase activity as tolerated/progressive mobility  9. Consult/collaborate with Respiratory Therapy  Outcome: Progressing     Problem: Inadequate Airway Clearance  Goal: Normal respiratory rate/effort achieved/maintained  Description  Interventions:  1. Plan activities to conserve energy: plan rest periods  Outcome: Progressing

## 2019-01-10 NOTE — Progress Note - Problem Oriented Charting Notewrit (Addendum)
Pharmacy Consult Warfarin Dosing  Kelli Brown    Age: 85 y.o.  Weight: 65.5 kg (144 lb 6.4 oz)  Indication: a fib  Goal INR: 2-3  Home warfarin dose =   2.5 mg daily except 5mg  Thurs and Sat  Interacting agent/disease: amiodarone, aspirin, methylprednisolone, Zosyn    Subjective/Objective:   [name] admitted from assisted living with hypoxia and lethargy. Takes warfarin as outpatient reportedly at above dosage, however it is not on patient's home med list, per facility home med list is not regularly updated. Received Vitamin K 5 mg po on 2/9 for elevated INR.    Assessment/Plan  . INR 2 today  . Warfarin 2.5 mg once tonight.   . Multivitamin continues  . Daily INR  . Monitor for signs and symptoms of bleeding  . Avoid IM injections    Current warfarin dose =(hold for INR greater than 3.5)  Date 2/8 2/9 2/10 2/11 2/12 2/13 2/14 2-15 2-16 2/17 2/18    INR 8.8 9.7 1.5 1.4 2.1 3.6 4.5 3.8 3.3 2.5 2    dose Hold hold 5 mg 5 mg Hold Hold Hold hold hold 2.5mg      Hgb 11.1 10.3 9.8 10.3 9.9   10.4  10.3     Plt 259 245 247 286 271   288  289         Past Medical History:   Diagnosis Date   . Abnormal vision    . Arthritis    . Atrial fibrillation    . Congestive heart failure    . Glaucoma    . Hip fx, right, closed, initial encounter 2010   . Hyperlipidemia    . Hypertension    . Low back pain    . Macular degeneration    . Nonrheumatic aortic (valve) stenosis 08/11/2017   . Shingles         Recent Labs   Lab 01/07/19  0959   Bilirubin, Total 1.2   Protein, Total 5.8*   Albumin 2.8*   ALT 18   AST (SGOT) 17       If you have any questions, please contact the pharmacist at [phone].    [name], PharmD

## 2019-01-11 ENCOUNTER — Inpatient Hospital Stay: Payer: Medicare Other

## 2019-01-11 LAB — PT/INR
PT INR: 2.1 — ABNORMAL HIGH (ref 0.5–1.3)
PT: 21.7 s — ABNORMAL HIGH (ref 9.5–11.5)

## 2019-01-11 MED ORDER — WARFARIN SODIUM 2.5 MG PO TABS
2.50 mg | ORAL_TABLET | Freq: Every day | ORAL | Status: AC
Start: 2019-01-11 — End: 2019-01-11
  Administered 2019-01-11: 18:00:00 2.5 mg via ORAL
  Filled 2019-01-11: qty 1

## 2019-01-11 NOTE — Progress Notes (Signed)
Medicine Progress Note - [facility]      Date Time: 01/11/19 7:16 AM  Patient Name: [facility] WHITE  Attending Physician: Alvino Blood, MD    Assessment:                                                                                        Active Problems:    Pneumonia    Acute exacerbation of CHF (congestive heart failure)    Acute respiratory failure with hypoxia    Hypertension    Chronic kidney disease, stage III (moderate)    Hyperlipidemia    Hypoxia        Plan:                                                                                                      Acute hypoxic respiratory failurelikelydue to pneumonia/diastolic heart failure  Probable multifactorial including interstitial lung disease, pulmonary hypertension, CHF, pneumonia/pneumonitis,possible amiodarone toxicity  S/p zosyn  Current O2 requirement: 3L NC  Solumedrol 40 mg q12h  Continue respiratory treatment diuresis  Pulmonary following, appreciate recs  Recommend up out of bed and PT/OT    Pneumonia/ Pneumonitis  CXR/CT: consistent with multifocal pneumonia/pneumonitis  Repeat imaging slow improvement of lung clear and airspace  S/p zosyn x 7 days  Blood cx negative  Incentive spirometry   Mucinex and claritin    Acute on chronic diastolic heart failure/moderate aortic stenosis  IV Lasix 40 mg. twice daily  Strict I's/O, daily weights  Echo: LVH, stable aortic stenosis, moderate tricuspid regurg, moderate to severe pulmonary artery pressure    CKD stage III  Creatinine stable    Supra therapeutic INR, resolved  INR 3.3  Coumadin per Pharm.D.    Mild anemia due to CKD  Better H&H    Paroxysmal atrial fibrillation  Metoprolol  Discontinued amiodarone  Coumadin/INR per Pharm.D.    HTN(hypertension):   Metoprolol    Hyperlipidemia  Continue with home meds    DVT ppx: coumadin - pharmacy to dose  Lines:                                                                                                        Patient Lines/Drains/Airways Status    Active PICC Line / CVC  Line / PIV Line / Drain / Airway / Intraosseous Line / Epidural Line / ART Line / Line / Wound / Pressure Ulcer / NG/OG Tube     Name:   Placement date:   Placement time:   Site:   Days:    Peripheral IV 01/01/19 Right Forearm   01/01/19    0030    Forearm   10                Disposition:                                                                                             Today's date: 01/11/2019  Length of Stay: 11  Code Status: NO CPR - SUPPORT OK  Anticipated medical stability for discharge in : 3 days    Subjective                                                                                             CC: <principal problem not specified>     Patient looks well this morning and has no complaints. Tolerated low flow nasal canula at 3 L without complaint. Interested in PT today. Denies chest pain, shortness of breath, palpitations or leg swelling.       Review of Systems:                                                                               Review of Systems   Constitutional: Negative for chills and fever.   HENT: Negative for congestion and sore throat.    Respiratory: Negative for cough and shortness of breath.    Cardiovascular: Negative for chest pain and leg swelling.   Gastrointestinal: Negative for abdominal pain, diarrhea, nausea and vomiting.   Genitourinary: Negative for dysuria, frequency and urgency.   Musculoskeletal: Negative for arthralgias and myalgias.   Skin: Negative for pallor and rash.   Neurological: Negative for dizziness and headaches.       Physical Exam:  Temp:  [97 F (36.1 C)-98.1 F (36.7 C)] 97.7 F (36.5 C)  Heart Rate:  [79-95] 79  Resp Rate:  [12-18] 18  BP: (107-125)/(50-58) 116/55  FiO2:  [35 %] 35 %    Intake/Output Summary (Last 24 hours) at 01/11/2019 0716  Last data filed at 01/11/2019 0000   Gross per 24 hour   Intake 800 ml   Output 900 ml   Net -100 ml       General: Awake, alert, oriented x 3; No acute distress.  HEENT: EOMI, Sclera anicteric  Oropharynx clear without lesions, mucous membranes moist  Glands: No cervical or axillary lymphadenopathy.  Neck: Supple, no lymphadenopathy, no thyromegaly  Cardiovascular: systolic ejection murmur consistent with aortic stenosis  Lungs: CTAB, without wheezes, rhonchi, or rales  Abdomen: Soft, non-tender, non-distended; no palpable masses, normoactive bowel sounds, no rebound or guarding  Extremities: No clubbing, cyanosis, or edema  Neuro: Cranial nerves grossly intact, strength 5/5 in upper and lower extremities, sensation intact  Psych: Normal affect, not depressed   Skin: No rashes or lesions noted    Meds:                                                                                                       Medications were reviewed in the electronic record: [x]       Estimated Creatinine Clearance: 19 mL/min (A) (based on SCr of 1.43 mg/dL (H)).  Current Facility-Administered Medications   Medication Dose Route Frequency   . albuterol  2.5 mg Nebulization 4x daily   . aspirin EC  81 mg Oral QAM   . dorzolamide  1 drop Left Eye TID   . famotidine  10 mg Oral Daily   . folic acid  1 mg Oral Daily   . furosemide  40 mg Intravenous BID   . guaiFENesin  600 mg Oral Q12H SCH   . lactobacillus species  50 Billion CFU Oral Daily   . loratadine  10 mg Oral Daily   . methylPREDNISolone  40 mg Intravenous Q12H SCH   . metoprolol succinate XL  50 mg Oral QAM   . multivitamin  1 tablet Oral Daily   . potassium chloride  20 mEq Oral QAM   . pravastatin  40 mg Oral QAM   . sodium chloride (PF)  3 mL Intravenous Q8H   . warfarin modified protocol placeholder  1 each Does not apply See Admin Instructions   . warfarin therapy placeholder  1 each Does not apply See Admin Instructions     PRN medications: acetaminophen **OR** acetaminophen **OR** acetaminophen, albuterol,  LORazepam, naloxone, ondansetron **OR** ondansetron, prochlorperazine  IV Drips:       Labs and Imaging:  Results     Procedure Component Value Units Date/Time    Prothrombin time/INR [[id number]]  (Abnormal) Collected:  01/11/19 0425    Specimen:  Blood Updated:  01/11/19 0523     PT 21.7 sec      PT INR 2.1          Microbiology, reviewed and are significant for:  Microbiology Results     Procedure Component Value Units Date/Time    Blood Culture [[id number]] Collected:  01/02/19 1300    Specimen:  Blood from Venipuncture Updated:  01/07/19 1313    Narrative:       Specimen/Source: Blood/Venipuncture  Collected: 01/02/2019 13:00     Status: Final      Last Updated: 01/07/2019 13:10                Culture Result (Final)      No Growth in 5 Days          Blood Culture [[id number]] Collected:  01/02/19 1300    Specimen:  Blood from Venipuncture Updated:  01/07/19 1313    Narrative:       Specimen/Source: Blood/Venipuncture  Collected: 01/02/2019 13:00     Status: Final      Last Updated: 01/07/2019 13:10                Culture Result (Final)      No Growth in 5 Days          Influenza A / B Rapid Test [[id number]] Collected:  12/31/18 1108    Specimen:  Nasal Wash Updated:  12/31/18 1143     Influenza A Negative     Influenza B Negative     Comment: Method: Jarvis Morgan    The sensitivity for this method is between 90% and 95% for Influenza A and around 90% for Influenza B. The specificity for both Influenza A and B is around 96%. False positive results may occur, especially when the prevalence of Influenza activity is low. This is more likely with Influenza B due to its lower prevalence. Clinical conditions, including the prevalence of influenza activity, should be considered in the interpretation of results. If clinically indicated, results may be confirmed with PCR testing.  The above 2 analytes were performed by [facility]  Main Lab [phone])  [address] [address]         Narrative:       Influenza A antigen detection tests are unable to distinquish between novel and seasonal influenza A.    A negative result for either Influenza A or B antigen does not exclude influenza virus infection. Clinical correlation required.    All positive influenza antigen tests (A or B) require placement of patient on droplet precaution isolation.    Nares MRSA Detection by DNA Amplification [[id number]] Collected:  01/04/19 0224    Specimen:  Nares Updated:  01/04/19 0415     MRSA No MRSA detected.     Comment: The above 1 analytes were performed by [facility] Main Lab [phone])  [address],[place] [address]               Imaging, reviewed and are significant for:  Radiology Results (24 Hour)     ** No results found for the last 24 hours. **            Signed:  Darral Dash, MD   SVRP R1, [phone]

## 2019-01-11 NOTE — Plan of Care (Signed)
NURSE NOTE SUMMARY  [facility] - GI/ENDO/GEN MED   Patient Name: [facility] WHITE   Attending Physician: Alvino Blood, MD   Today's date:   01/11/2019 LOS: 11 days   Shift Summary:                                                              Assumed care at 1900. Meds given per MAR. No cc of pain. O2 monitor slides off easily, changed from 2L to 4L for bed, current O2 sat 99. Denies any other needs. Bed alarm on. Call bell and bedside table  within reach. Will continue to monitor.    Provider Notifications:      Rapid Response Notifications:  Mobility:      PMP Activity: Step 5 - Chair (01/11/2019  9:30 PM)     Weight tracking:  Family Dynamic:   Last 3 Weights for the past 72 hrs (Last 3 readings):   Weight   01/10/19 0548 65.5 kg (144 lb 6.4 oz)   01/09/19 0500 65.8 kg (145 lb)             Recent Vitals Last Bowel Movement   BP: 127/62 (01/11/2019 11:00 PM)  Heart Rate: 82 (01/11/2019 11:00 PM)  Temp: 97.1 F (36.2 C) (01/11/2019 11:00 PM)  Resp Rate: 18 (01/11/2019 11:00 PM)  Weight: 65.5 kg (144 lb 6.4 oz) (01/10/2019  5:48 AM)  SpO2: 96 % (01/11/2019 11:00 PM)   Last BM Date: 01/11/19        Problem: Moderate/High Fall Risk Score >5  Goal: Patient will remain free of falls  Outcome: Progressing     Problem: Compromised Tissue integrity  Goal: Damaged tissue is healing and protected  Description  Interventions:  1. Monitor/assess Braden scale every shift  2. Provide wound care per wound care algorithm  3. Reposition patient every 2 hours and as needed unless able to reposition self  4. Increase activity as tolerated/progressive mobility  5. Relieve pressure to bony prominences for patients at moderate and high risk  6. Avoid shearing injuries   7. Keep intact skin clean and dry  8. Use bath wipes, not soap and water, for daily bathing   9. Use incontinence wipes for cleaning urine, stool and caustic drainage; Foley care as needed   10. Monitor external devices/tubes for correct placement to  prevent pressure, friction and shearing   11. Encourage use of lotion/moisturizer on skin  12. Monitor patient's hygiene practices  13. Consult/collaborate with wound care nurse   14. Utilize specialty bed  15. Consider placing an indwelling catheter if incontinence interferes with healing of stage 3 or 4 pressure injury  Outcome: Progressing  Goal: Nutritional status is improving  Description  Interventions:  1. Assist patient with eating   2. Allow adequate time for meals   3. Encourage patient to take dietary supplement(s) as ordered   4. Collaborate with Clinical Nutritionist  5. Include patient/patient care companion in decisions related to nutrition  Outcome: Progressing     Problem: Compromised Hemodynamic Status  Goal: Vital signs and fluid balance maintained/improved  Description  Interventions:  1. Position patient for maximum circulation / cardiac output  2. Monitor and assess vitals and hemodynamic parameters with position changes  3. Monitor and compare daily  weight  4. Monitor intake and output.  Notify LIP if urine output is less than 30 mL/hour   5. Monitor/assess lab values and report abnormal values  Outcome: Progressing     Problem: Inadequate Gas Exchange  Goal: Adequate oxygenation and improved ventilation  Description  Interventions  1. Assess lung sounds   2. Monitor SpO2 and treat as needed  3. Monitor and treat ETCO2  4. Provide mechanical and oxygen support to facilitate gas exchange  5. Position for maximum ventilatory efficiency  6. Teach/reinforce use of incentive spirometer 10 times per hour while awake, cough and deep breath as needed  7. Plan activities to conserve energy: plan rest periods  8. Increase activity as tolerated/progressive mobility  9. Consult/collaborate with Respiratory Therapy  Outcome: Progressing     Problem: Inadequate Airway Clearance  Goal: Normal respiratory rate/effort achieved/maintained  Description  Interventions:  1. Plan activities to conserve energy: plan  rest periods  Outcome: Progressing

## 2019-01-11 NOTE — PT Eval Note (Signed)
[facility]  Patient: Kelli Brown     CSN: [id number]    Bed: 528/528-A  Physical Therapy EVALUATION  Visit#: 1   Treatment Frequency: 4-5x/wk  Last seen by a physical therapist vs. Physical therapist assistant: 01/11/2019     DISCHARGE RECOMMENDATIONS   Discharge Recommendations:   SNF(versus return to ALF with full-time supervision/assist)   Unclear if ALF staff can provide this level of assist, if so recommend HHPT as well   *Discharge recommendations are subject to change based on patient's progress and/or home support changes - please refer to most recent PT note for current recommendation    DME recommended for Discharge:   Has needed equipment    PMP (Progressive Mobility Program) Recommendations:   Recommend patient  transfer to chair/BSC 2-3 times/day with Wheeled walker and physical assist and/or supervision of 1 staff, out of bed to chair for meals as tolerated.     Precautions and Contraindications:   Falls  Mobility protocol     PT Assessment and Plan of Care (Treatment frequency noted above):     HPI (per physician charting) and Pertinent Medical Details:  Admitted 12/31/2018 with/for shortness of breath and weakness. Diagnosis: acute exacerbation of CHF; respiratory failure with hypoxia. Pt with stage III CKD as well    Goals:    Short-term Goals (2-4 visits):  1. Patient will perform bed mobility with min A in prep for EOB activity. NEW  2. Patient will perform sit/stand with min A with least restrictive assistive device in prep for ambulation. NEW   3. Patient will perform stand pivot transfer with min A with least restrictive assistive device in order to transfer to chair. NEW   4. Patient will ambulate 10' with min A with least restrictive assistive device in prep for household ambulation. NEW    Long-term Goals (by discharge):  1. Patient will perform bed mobility with supervision in prep for EOB activity. NEW  2. Patient will perform sit/stand with supervision with  least restrictive assistive device  in prep for ambulation. NEW  3. Patient will ambulate 56' with supervision with least restrictive assistive device in prep for household ambulation. NEW     PT Assessment:  At baseline, pt ambulates household distances with walker and performs ADLs independently. Pt has lived at [facility] for 2 years, however functions independently. At this time, pt fatigued and only willing to perform bed mobility and 1x sit/stand. Pt required min-mod A. Recommend SNF unless ALF can provide 24/7 direct assist upon d/c.      Patient presenting with the following PT Impairments:decreased strength, decreased activity tolerance, decreased functional mobility, decreased balance    Patient will benefit from skilled PT services in order to address above impairments to maximize functional outcomes and independence.       Treatment/interventions: Exercise, Gait training, Functional transfer training, LE strengthening/ROM, Patient/caregiver training, Equipment eval/education, Bed mobility, Compensatory technique education    Due to the presence of several treatment options and 1-2 comorbidities or personal factors that affect performance, as well as patient's evolving clinical presentation with changing characteristics, minimal to moderate modifications of mobility and/or assistance were necessary to complete evaluation when examining total of 3 elements (includes body structures and functions, activity limitations and/or participation restriction) determines the degree of complexity for this patient is MODERATE    Rehabilitation Potential:Fair given age and comorbidities ; with ongoing PT upon discharge from acute care.     Discussed risk, benefits and Plan of  Care with: Patient    *note: Clinical Presentation and Decision Making includes the following sections: Goals, PT assessment, treatment frequency and treatment/interventions):    History Based on physician charted EPIC/EMR information:      Medical Diagnosis: Acute respiratory failure [J96.00]  Hypoxia [R09.02]  Pulmonary edema with congestive heart failure [I50.1]  Supratherapeutic international normalized ratio (INR) [R79.1]    Problem list:  Patient Active Problem List   Diagnosis   . Cervical spine fracture   . Ocular hypertension of left eye   . Chronic midline low back pain without sciatica   . Nonrheumatic aortic (valve) stenosis   . Pneumonia   . Acute exacerbation of CHF (congestive heart failure)   . Acute respiratory failure with hypoxia   . Hypertension   . Chronic kidney disease, stage III (moderate)   . Hyperlipidemia   . Hypoxia        Past Medical/Surgical History:  Past Medical History:   Diagnosis Date   . Abnormal vision    . Arthritis    . Atrial fibrillation    . Congestive heart failure    . Glaucoma    . Hip fx, right, closed, initial encounter 2010   . Hyperlipidemia    . Hypertension    . Low back pain    . Macular degeneration    . Nonrheumatic aortic (valve) stenosis 08/11/2017   . Shingles       Past Surgical History:   Procedure Laterality Date   . APPENDECTOMY     . EYE SURGERY      cateracts   . HIP SURGERY Bilateral     Tubes tied   . TONSILLECTOMY     . TUBAL LIGATION         Social History    Information per Patient:    Home Living Arrangements:  Living Arrangements: Facility: [facility]  Assistance Available: Full time , however unclear if they can provide 24/7 direct assist   Type of Home: Assisted living  Home Layout: One level    Prior Level of Function:  Household ambulation  Mobility:  Modified independence  with  Front wheeled walker  Fall history: 2 in the last year     DME available at home:  Front wheeled walker     Subjective   "I just can't today, I'm too tired and weak"   Patient/family/caregiver consent to therapy session is noted by the participation in the therapy session.    Patient/caregiver goal for PT: to regain strength, endurance, independence      Pain:  At Rest: 0/10  With Activity:  0/10  Location: N/A  Interventions: None required    Examination of Body Systems (Structures, Function, Activity and Participation)   Patient's medical condition is appropriate for Physical therapy intervention at this time    Observation of patient:  Patient is in bed with Bed/chair alarm on, O2 at 3 liters/minute via nasal cannula     Cognition:  Oriented to: Oriented x4  Command following: Follows ALL commands and directions without difficulty  Alertness/Arousal: Appropriate responses to stimuli   Attention Span:Appears intact  Memory: Appears intact    Vital Signs (Cardiovascular):  SpO2 at rest: 93% on 3LNC    Edema: not present   Skin Inspection: intact   Sensation: intact , to light touch, bilateral lower extremities     Balance:  Static Sitting:  Fair+  Dynamic Sitting:  Fair+  Static Standing:  Fair-  Musculoskeletal Examination:            Range of motion:  Right LE: Grossly WFL  Left LE: Grossly WFL       Strength:  Right LE: Grossly 4/5  Left LE: Grossly 4/5    Tone:  Not applicable    Functional Mobility:    Bed Mobility:  Supine to Sit:   Minimal assist.   Cues for Sequencing., Cues for Hand placement., HOB flat, Bed rail used, assist at trunk  Sit to Supine:   Minimal assist.   Cues for Sequencing., HOB flat, assist at LE(s)    Transfers:  Sit to Stand:  Moderate assist with Front wheeled walker.    Cues for Sequencing, Cues for Hand Placement  Stand to Sit:  Minimal assist.    Cues for Sequencing, Cues for Hand Placement    Locomotion:  Not tested due to pt declining d/t fatigue         Treatment Interventions this session:   Evaluation  Therapeutic activity  Patient/family/caregiver education    Education Provided:   TOPICS: role of physical therapy, plan of care, goals of therapy and safety with mobility and ADLs, benefits of activity, use of adaptive equipment, bed mobility with use of adaptive equipment or strategy, activity with nursing     Learner educated: Patient, Nursing  Staff  Method: Explanation  Response to education: Verbalized understanding and Needs reinforcement    Patient Position at End of Treatment:   Right side lying, in bed, in the room, Needs in reach, Bed/chair alarm set and No distress    Team Communication:     Spoke to: RN/LPN - Gabby  Regarding: Pre-session re: patient status, Patient position at end of session, Discharge needs, Patient participation with Therapy, Vital signs, Progressive Mobility Program recommendations  Whiteboard updated: No  PT/PTA communication: via written note and verbal communication as needed.    Time of treatment:  Time Calculation  PT Received On: 01/11/19  Start Time: 1426  Stop Time: 1439  Time Calculation (min): 13 min    Laural Roes, PT, DPT

## 2019-01-11 NOTE — Progress Note - Problem Oriented Charting Notewrit (Signed)
Pharmacy Consult Warfarin Dosing  Kelli Brown    Age: 85 y.o.  Weight: 65.5 kg (144 lb 6.4 oz)  Indication: a fib  Goal INR: 2-3  Home warfarin dose =   2.5 mg daily except 5mg  Thurs and Sat  Interacting agent/disease: amiodarone, aspirin, methylprednisolone, Zosyn    Subjective/Objective:   [name] admitted from assisted living with hypoxia and lethargy. Takes warfarin as outpatient reportedly at above dosage, although it is not on patient's home med list. Per facility, home med list is not regularly updated. Received Vitamin K 5 mg po on 2/9 for elevated INR. Multivitamin started 2/14.     Assessment/Plan   INR 2.1 today   Warfarin 2.5 mg once tonight.    Multivitamin continues   Daily INR   Monitor for signs and symptoms of bleeding   Avoid IM injections    Current warfarin dose =(hold for INR greater than 3.5)  Date 2/8 2/9 2/10 2/11 2/12 2/13 2/14 2-15 2-16 2/17 2/18 2/19   INR 8.8 9.7 1.5 1.4 2.1 3.6 4.5 3.8 3.3 2.5 2 2.1   dose Hold hold 5 mg 5 mg Hold Hold Hold hold hold 2.5mg  2.5mg     Hgb 11.1 10.3 9.8 10.3 9.9   10.4  10.3     Plt 259 245 247 286 271   288  289         Past Medical History:   Diagnosis Date    Abnormal vision     Arthritis     Atrial fibrillation     Congestive heart failure     Glaucoma     Hip fx, right, closed, initial encounter 2010    Hyperlipidemia     Hypertension     Low back pain     Macular degeneration     Nonrheumatic aortic (valve) stenosis 08/11/2017    Shingles         Recent Labs   Lab 01/07/19  0959   Bilirubin, Total 1.2   Protein, Total 5.8*   Albumin 2.8*   ALT 18   AST (SGOT) 17       If you have any questions, please contact the pharmacist at [phone].    Kelli Brown, PharmD

## 2019-01-11 NOTE — Plan of Care (Addendum)
NURSE NOTE SUMMARY  [facility] - GI/ENDO/GEN MED   Patient Name: Kelli Brown   Attending Physician: Alvino Blood, MD   Today's date:   01/11/2019 LOS: 11 days   Shift Summary:                                                              0830: Assume care at 0700. Assessment complete, no s/s of distress noted. Pt denied any pain or SOB. RT by to change O2 over to 3L NC. On continuous pulse ox sats in mid 90's. All other needs met.   1330: Pt desatting to 85% on 2L NC while sleeping due to mouth breathing, placed back on oxymask at 4L and sats quickly rose to 95%.  1404: O2 weaned to 2L oxymask at this time as sats were 100% on 3L oxymask.  1530: Pt awake now, switched back to 2L NC.    Provider Notifications:      Rapid Response Notifications:  Mobility:      PMP Activity: Step 6 - Walks in Room (01/10/2019  7:39 PM)     Weight tracking:  Family Dynamic:   Last 3 Weights for the past 72 hrs (Last 3 readings):   Weight   01/10/19 0548 65.5 kg (144 lb 6.4 oz)   01/09/19 0500 65.8 kg (145 lb)             Recent Vitals Last Bowel Movement   BP: 119/60 (01/11/2019  7:44 AM)  Heart Rate: 79 (01/11/2019  7:44 AM)  Temp: (!) 96.3 F (35.7 C) (01/11/2019  7:44 AM)  Resp Rate: 16 (01/11/2019  7:44 AM)  Weight: 65.5 kg (144 lb 6.4 oz) (01/10/2019  5:48 AM)  SpO2: 95 % (01/11/2019 11:22 AM)   Last BM Date: 01/09/19        Problem: Compromised Hemodynamic Status  Goal: Vital signs and fluid balance maintained/improved  Outcome: Progressing  Flowsheets (Taken 01/11/2019 1138)  Vital signs and fluid balance are maintained/improved: Position patient for maximum circulation/cardiac output;Monitor/assess vitals and hemodynamic parameters with position changes;Monitor/assess lab values and report abnormal values;Monitor intake and output. Notify LIP if urine output is less than 30 mL/hour.     Problem: Inadequate Gas Exchange  Goal: Adequate oxygenation and improved ventilation  Outcome: Progressing  Flowsheets  (Taken 01/11/2019 1138)  Adequate oxygenation and improved ventilation: Assess lung sounds;Monitor SpO2 and treat as needed;Consult/collaborate with Respiratory Therapy;Increase activity as tolerated/progressive mobility;Position for maximum ventilatory efficiency;Plan activities to conserve energy: plan rest periods     Problem: Inadequate Airway Clearance  Goal: Normal respiratory rate/effort achieved/maintained  Outcome: Progressing  Flowsheets (Taken 01/11/2019 1138)  Normal respiratory rate/effort achieved/maintained: Plan activities to conserve energy: plan rest periods

## 2019-01-12 LAB — CBC AND DIFFERENTIAL
Bands: 1 % (ref 0–10)
Basophils %: 0 % (ref 0.0–3.0)
Basophils Absolute: 0 10*3/uL (ref 0.0–0.3)
Eosinophils %: 0 % (ref 0.0–7.0)
Eosinophils Absolute: 0 10*3/uL (ref 0.0–0.8)
Hematocrit: 37.9 % (ref 36.0–48.0)
Hemoglobin: 11.8 gm/dL — ABNORMAL LOW (ref 12.0–16.0)
Lymphocytes Absolute: 0.8 10*3/uL (ref 0.6–5.1)
Lymphocytes: 4 % — ABNORMAL LOW (ref 15.0–46.0)
MCH: 32 pg (ref 28–35)
MCHC: 31 gm/dL — ABNORMAL LOW (ref 32–36)
MCV: 102 fL — ABNORMAL HIGH (ref 80–100)
MPV: 7.8 fL (ref 6.0–10.0)
Monocytes Absolute: 0.6 10*3/uL (ref 0.1–1.7)
Monocytes: 3 % (ref 3.0–15.0)
Myelocytes: 4 % — ABNORMAL HIGH (ref 0–0)
Neutrophils %: 88 % — ABNORMAL HIGH (ref 42.0–78.0)
Neutrophils Absolute: 17.5 10*3/uL — ABNORMAL HIGH (ref 1.7–8.6)
PLT CT: 280 10*3/uL (ref 130–440)
RBC: 3.72 10*6/uL — ABNORMAL LOW (ref 3.80–5.00)
RDW: 17.5 % — ABNORMAL HIGH (ref 11.0–14.0)
WBC: 18.8 10*3/uL — ABNORMAL HIGH (ref 4.0–11.0)

## 2019-01-12 LAB — COMPREHENSIVE METABOLIC PANEL
ALT: 19 U/L (ref 0–55)
AST (SGOT): 18 U/L (ref 10–42)
Albumin/Globulin Ratio: 1.1 Ratio (ref 0.80–2.00)
Albumin: 3.2 gm/dL — ABNORMAL LOW (ref 3.5–5.0)
Alkaline Phosphatase: 66 U/L (ref 40–145)
Anion Gap: 13.5 mMol/L (ref 7.0–18.0)
BUN / Creatinine Ratio: 40.5 Ratio — ABNORMAL HIGH (ref 10.0–30.0)
BUN: 49 mg/dL — ABNORMAL HIGH (ref 7–22)
Bilirubin, Total: 1.1 mg/dL (ref 0.1–1.2)
CO2: 36 mMol/L — ABNORMAL HIGH (ref 20–30)
Calcium: 9.1 mg/dL (ref 8.5–10.5)
Chloride: 94 mMol/L — ABNORMAL LOW (ref 98–110)
Creatinine: 1.21 mg/dL — ABNORMAL HIGH (ref 0.60–1.20)
EGFR: 38 mL/min/{1.73_m2} — ABNORMAL LOW (ref 60–150)
Globulin: 2.9 gm/dL (ref 2.0–4.0)
Glucose: 159 mg/dL — ABNORMAL HIGH (ref 71–99)
Osmolality Calculated: 292 mOsm/kg (ref 275–300)
Potassium: 5.5 mMol/L — ABNORMAL HIGH (ref 3.5–5.3)
Protein, Total: 6.1 gm/dL (ref 6.0–8.3)
Sodium: 138 mMol/L (ref 136–147)

## 2019-01-12 LAB — PT/INR
PT INR: 2.2 — ABNORMAL HIGH (ref 0.5–1.3)
PT: 21.9 s — ABNORMAL HIGH (ref 9.5–11.5)

## 2019-01-12 MED ORDER — FUROSEMIDE 40 MG PO TABS
40.0000 mg | ORAL_TABLET | Freq: Two times a day (BID) | ORAL | Status: DC
Start: 2019-01-12 — End: 2019-01-16
  Administered 2019-01-12 – 2019-01-16 (×8): 40 mg via ORAL
  Filled 2019-01-12 (×9): qty 1

## 2019-01-12 MED ORDER — PREDNISONE 20 MG PO TABS
40.0000 mg | ORAL_TABLET | Freq: Two times a day (BID) | ORAL | Status: DC
Start: 2019-01-12 — End: 2019-01-14
  Administered 2019-01-12 – 2019-01-14 (×5): 40 mg via ORAL
  Filled 2019-01-12 (×7): qty 2

## 2019-01-12 MED ORDER — WARFARIN SODIUM 2.5 MG PO TABS
2.50 mg | ORAL_TABLET | Freq: Every day | ORAL | Status: AC
Start: 2019-01-12 — End: 2019-01-12
  Administered 2019-01-12: 17:00:00 2.5 mg via ORAL
  Filled 2019-01-12: qty 1

## 2019-01-12 MED ORDER — NYSTATIN 100000 UNIT/ML MT SUSP
500000.00 [IU] | Freq: Four times a day (QID) | OROMUCOSAL | Status: DC
Start: 2019-01-12 — End: 2019-01-14
  Administered 2019-01-12 – 2019-01-14 (×8): 500000 [IU] via ORAL
  Filled 2019-01-12 (×13): qty 5

## 2019-01-12 NOTE — Progress Notes (Signed)
Nutrition Therapy  Nutrition Follow Up    Patient Information:     Name:Kelli Brown   Age: 85 y.o.   Sex: female     MRN: [id number]      Recommendation:     1. Thrive BID (chocolate)    Nutrition Assessment:     Admitted with respiratory failure/pneumonia, history of CHF. Seen by SLP 2/13 and cleared for regular/thin. Per I/O she continues to eat 75-100% of meals and supplements. Pt was seen by palliative care on 2/17 but family deferred services and pt remains DNR/support OK.     Nutrition Focus Physical Exam: Normal    Nutrition Risk Level: Low      Nutrition Diagnosis:     Predicted Suboptimal Energy Intake -- resolved      Monitoring:  Evaluation:    PO/EN/PN intake:  Total energy intake and Liquid meal replacement, or supplement intake   Labs:  Electrolyte Profile, Renal Profile and Glucose, casual    GI Profile:  Bowel Function   Nutrition Focused Physical:  Overall appearance       Assessment Data:     Height: 1.524 m (5')   Weight: 62.5 kg (137 lb 12.8 oz)   BMI: Body mass index is 26.91 kg/m.   IBW: 45 kg    Pertinent Meds: Pepcid, folic acid, lasix, bio-k plus, solu-medrol, multivitamin, k-dur, pravastatin, coumadin    Recent Labs   Lab 01/09/19  0528 01/07/19  0959 01/05/19  1039   Sodium 142 142 143   Potassium 3.8 4.3 3.8   Chloride 98 101 103   CO2 30.8* 29 28   BUN 44* 44* 46*   Creatinine 1.43* 1.30* 1.48*   Glucose 206* 239* 227*   Calcium 8.8 8.7 8.8        Diet Order:  Orders Placed This Encounter   Procedures   . Diet cardiac Fluid restriction: 1800 ML FLUID   . Thrive Chocolate Ice Cream Cup Quantity: A. One; Frequency: BID with meals LUNCH and DINNER        GI symptoms: 4x BM yesterday      Hydration: WDL  I/O:  +1020 mL/-1200 mL yesterday   Skin: WDL      Estimated Needs:  Estimated Energy Needs  Total Energy Estimated Needs: 1000-1200 kcal  Method for Estimating Needs: 22-25 kcal/kg ibw (45 kg)  Estimated Protein Needs  Total Protein Estimated Needs: 54-68 gm  Method for Estimating  Needs: 1.2-1.5 gm/kg ibw (45 kg)       Additional Comments:       Daiva Eves, RD  01/12/2019 9:53 AM

## 2019-01-12 NOTE — Progress Notes (Signed)
Medicine Progress Note - [facility]      Date Time: 01/12/19 7:39 AM  Patient Name: Kelli Brown  Attending Physician: Esmond Harps, DO    Assessment:                                                                                        Active Problems:    Pneumonia    Acute exacerbation of CHF (congestive heart failure)    Acute respiratory failure with hypoxia    Hypertension    Chronic kidney disease, stage III (moderate)    Hyperlipidemia    Hypoxia        Plan:                                                                                                      Acute hypoxic respiratory failurelikelydue to pneumonia/diastolic heart failure  Probable multifactorial including interstitial lung disease, pulmonary hypertension, CHF, pneumonia/pneumonitis,possible amiodarone toxicity  S/p zosyn  Current O2 requirement: 2L NC, continue to wean as tolerated   Discontinued solumedrol, transitioned to prednisone 40 mg BID  Switched to oral lasix 40 mg BID  PT/OT recommends SNF vs ALF with full time supervision/assist     Pneumonia/ Pneumonitis  CXR/CT: consistent with multifocal pneumonia/pneumonitis  Repeat imaging slow improvement of aeration   S/p zosyn x 7 days  Blood cx negative  Incentive spirometry  Mucinex and claritin    Acute on chronic diastolic heart failure/moderate aortic stenosis  Switch to oral lasix 40 mg BID  Strict I's/O, daily weights  Echo: LVH, stable aortic stenosis, moderate tricuspid regurg, moderate to severe pulmonary artery pressure    CKD stage III  Creatinine stable    Supra therapeutic INR, resolved  Coumadin per Pharm.D.    Mild anemia due to CKD  Better H&H    Paroxysmal atrial fibrillation  Metoprolol  Discontinued amiodarone  Coumadin/INR per Pharm.D.    HTN(hypertension):   Metoprolol    Hyperlipidemia  Continue with home meds    DVT ppx: coumadin- pharmacy to dose  Lines:                                                                                                        Patient Lines/Drains/Airways Status    Active  PICC Line / CVC Line / PIV Line / Drain / Airway / Intraosseous Line / Epidural Line / ART Line / Line / Wound / Pressure Ulcer / NG/OG Tube     Name:   Placement date:   Placement time:   Site:   Days:    Peripheral IV 01/01/19 Right Forearm   01/01/19    0030    Forearm   11                Disposition:                                                                                             Today's date: 01/12/2019  Length of Stay: 12  Code Status: NO CPR - SUPPORT OK  Anticipated medical stability for discharge in : 3 days    Subjective                                                                                             CC: <principal problem not specified>     Patient reports she is having diarrhea every morning that she cannot control. Denies fever, abdominal pain, cramping. Shortness of breath resolved.       Review of Systems:                                                                               Review of Systems   Constitutional: Negative for chills and fever.   HENT: Negative for congestion and sore throat.    Respiratory: Negative for cough and shortness of breath.    Cardiovascular: Negative for chest pain and leg swelling.   Gastrointestinal: Positive for diarrhea. Negative for abdominal pain, nausea and vomiting.   Genitourinary: Negative for dysuria, frequency and urgency.   Musculoskeletal: Negative for arthralgias and myalgias.   Skin: Negative for pallor and rash.   Neurological: Negative for dizziness and headaches.       Physical Exam:                                                                                     Temp:  [  96.3 F (35.7 C)-97.2 F (36.2 C)] 97.2 F (36.2 C)  Heart Rate:  [79-95] 80  Resp Rate:  [16-18] 18  BP: (116-130)/(59-62) 129/61    Intake/Output Summary (Last 24 hours) at 01/12/2019 0739  Last data filed at 01/12/2019 0600  Gross per 24 hour   Intake 1020  ml   Output 1200 ml   Net -180 ml       General: Awake, alert, oriented x 3; No acute distress.  HEENT: EOMI, Sclera anicteric  Oropharynx clear without lesions, mucous membranes moist  Glands: No cervical or axillary lymphadenopathy.  Neck: Supple, no lymphadenopathy, no thyromegaly  Cardiovascular: RRR, no murmurs, rubs or gallops  Lungs: CTAB, without wheezes, rhonchi, or rales  Abdomen: Soft, non-tender, non-distended; no palpable masses, normoactive bowel sounds, no rebound or guarding  Extremities: No clubbing, cyanosis, or edema  Neuro: Cranial nerves grossly intact, strength 5/5 in upper and lower extremities, sensation intact  Psych: Normal affect, not depressed   Skin: No rashes or lesions noted    Meds:                                                                                                       Medications were reviewed in the electronic record: [x]       Estimated Creatinine Clearance: 18.6 mL/min (A) (based on SCr of 1.43 mg/dL (H)).  Current Facility-Administered Medications   Medication Dose Route Frequency   . albuterol  2.5 mg Nebulization 4x daily   . aspirin EC  81 mg Oral QAM   . dorzolamide  1 drop Left Eye TID   . famotidine  10 mg Oral Daily   . folic acid  1 mg Oral Daily   . furosemide  40 mg Intravenous BID   . guaiFENesin  600 mg Oral Q12H SCH   . lactobacillus species  50 Billion CFU Oral Daily   . loratadine  10 mg Oral Daily   . methylPREDNISolone  40 mg Intravenous Q12H SCH   . metoprolol succinate XL  50 mg Oral QAM   . multivitamin  1 tablet Oral Daily   . potassium chloride  20 mEq Oral QAM   . pravastatin  40 mg Oral QAM   . sodium chloride (PF)  3 mL Intravenous Q8H   . warfarin therapy placeholder  1 each Does not apply See Admin Instructions     PRN medications: acetaminophen **OR** acetaminophen **OR** acetaminophen, albuterol, LORazepam, naloxone, ondansetron **OR** ondansetron, prochlorperazine  IV Drips:       Labs and Imaging:                                                                                  Results     Procedure Component Value Units  Date/Time    Prothrombin time/INR [[id number]]  (Abnormal) Collected:  01/12/19 0431    Specimen:  Blood Updated:  01/12/19 0534     PT 21.9 sec      PT INR 2.2          Microbiology, reviewed and are significant for:  Microbiology Results     Procedure Component Value Units Date/Time    Blood Culture [[id number]] Collected:  01/02/19 1300    Specimen:  Blood from Venipuncture Updated:  01/07/19 1313    Narrative:       Specimen/Source: Blood/Venipuncture  Collected: 01/02/2019 13:00     Status: Final      Last Updated: 01/07/2019 13:10                Culture Result (Final)      No Growth in 5 Days          Blood Culture [[id number]] Collected:  01/02/19 1300    Specimen:  Blood from Venipuncture Updated:  01/07/19 1313    Narrative:       Specimen/Source: Blood/Venipuncture  Collected: 01/02/2019 13:00     Status: Final      Last Updated: 01/07/2019 13:10                Culture Result (Final)      No Growth in 5 Days          Influenza A / B Rapid Test [[id number]] Collected:  12/31/18 1108    Specimen:  Nasal Wash Updated:  12/31/18 1143     Influenza A Negative     Influenza B Negative     Comment: Method: Jarvis Morgan    The sensitivity for this method is between 90% and 95% for Influenza A and around 90% for Influenza B. The specificity for both Influenza A and B is around 96%. False positive results may occur, especially when the prevalence of Influenza activity is low. This is more likely with Influenza B due to its lower prevalence. Clinical conditions, including the prevalence of influenza activity, should be considered in the interpretation of results. If clinically indicated, results may be confirmed with PCR testing.  The above 2 analytes were performed by [facility] [phone])  [address] [address]         Narrative:       Influenza A antigen detection tests are unable to distinquish  between novel and seasonal influenza A.    A negative result for either Influenza A or B antigen does not exclude influenza virus infection. Clinical correlation required.    All positive influenza antigen tests (A or B) require placement of patient on droplet precaution isolation.    Nares MRSA Detection by DNA Amplification [[id number]] Collected:  01/04/19 0224    Specimen:  Nares Updated:  01/04/19 0415     MRSA No MRSA detected.     Comment: The above 1 analytes were performed by [facility] [phone])  [address],[place] [address]               Imaging, reviewed and are significant for:  Radiology Results (24 Hour)     Procedure Component Value Units Date/Time    XR Chest AP Portable [[id number]] Collected:  01/11/19 0741    Order Status:  Completed Updated:  01/11/19 0743    Narrative:       Clinical History:  pneumonitis    Ordering Comments:   None.  Study Notes:   History of pneumonia and congestive heart failure.     Examination:  Frontal view of the chest.    Comparison:  01/09/2019      Impression:       Persistent bilateral airspace disease or edema. Stable heart size. Atherosclerotic changes of the thoracic aorta. Osteoarthritic changes of thoracic spine. Minimal change from 01/09/2019.    ReadingStation:[id number]            Signed:  Darral Dash, MD   SVRP R1, [phone]

## 2019-01-12 NOTE — OT Eval Note (Signed)
[facility]: [facility]  Department of Rehabilitation Services: [phone]    Kelli Brown    CSN#: [id number]  GI/ENDO/GEN MED 528/528-A    Occupational Therapy Evaluation    Consult received for Kelli Brown for OT Evaluation and Treatment.  Patients medical condition is appropriate for Occupational therapy intervention at this time.    Time of treatment:   Time Calculation  OT Received On: 01/12/19  Start Time: 1135  Stop Time: 1151  Time Calculation (min): 16 min    Visit#: 1    Precautions and Contraindications:   Falls  Mobility protocol     OT Assessment/Clinical Decision Making:      Kelli Brown is a 85 y.o. female admitted 12/31/2018 presenting with shortness of breath and weakness. Diagnosis: acute exacerbation of CHF; respiratory failure with hypoxia, stage III CKD. resulting in impairments with balance deficits, decreased activity tolerance and fall risk. These impairments are affecting the patient's ability to perform in needed/desired occupations resulting in performance deficits in bathing/showering, toileting & toilet hygiene, dressing, functional mobility/transfers and personal hygiene & grooming. Would benefit from continued occupational therapy services for above noted impairments.     Rehabilitation Potential: Good With continued OT s/p acute discharge     Risks/benefits/POC discussed: Patient    Plan:   Treatment/interventions: ADL retraining, functional transfer training, UE strengthening/ROM, activity pacing, patient/family training, equipment eval/education, compensatory technique education    Treatment Frequency: OT Frequency Recommended: 3-4x/wk    Goals:   STG In 2 to 4 visits:  1. Patient will donn/doff socks with Minimal assist and use of AE/AT prn. NEW  2. Patient will thread pants/undergarments over knees with Minimal assist and use of AE/AT/AD prn. NEW  3. Patient will stand and arrange pants/undergarments over hips with Supervision and use of  AE/AT/AD prn.  NEW  4. Patient will perform toilet transfer with Supervision and use of AD/DME prn. NEW  5. Patient will perform 2-3 grooming tasks with Supervision standing at sink and use of AE/AT prn. NEW    LTG (by d/c):   1. Patient will be Modified independence  for ADLs, Modified independence  for functional transfers/mobility with use of AD/DME/AT/AE. NEW       DISCHARGE RECOMMENDATIONS   DME recommended for Discharge:   TBD closer to d/c  TBD at next level of care    Discharge Recommendations:   SNF   Pending medical progress  Pending progress in next therapy session    Medical & Therapy History:   Medical Diagnosis: Acute respiratory failure [J96.00]  Hypoxia [R09.02]  Pulmonary edema with congestive heart failure [I50.1]  Supratherapeutic international normalized ratio (INR) [R79.1]    X-Rays/Tests/Labs:  Xr Chest Ap Portable    Result Date: 01/11/2019  Persistent bilateral airspace disease or edema. Stable heart size. Atherosclerotic changes of the thoracic aorta. Osteoarthritic changes of thoracic spine. Minimal change from 01/09/2019. ReadingStation:[email or web address]    Xr Chest Ap Portable    Result Date: 01/09/2019  1. Bilateral infiltrates are unchanged. 2. Small right pleural effusion. ReadingStation:[email or web address]    Xr Chest Ap Portable    Result Date: 01/08/2019  1.  Persistent widespread airspace opacity, with improved aeration since February 14. 2.  Somewhat nodular density at the right base, not well-seen on the previous exam. Follow-up to radiographic resolution suggested. 3.  Pleural effusions, decreased in size. ReadingStation:[email or web address]    Xr Chest Ap Portable    Result Date: 01/06/2019  No change. ReadingStation:[email or web address]    Discussed with  patient/family/caregiver the patient's physical, cognitive and/or psychosocial history related to current functional performance: yes    Previous therapy services: No    Patient Active Problem List   Diagnosis    Cervical spine fracture    Ocular hypertension of left eye     Chronic midline low back pain without sciatica    Nonrheumatic aortic (valve) stenosis    Pneumonia    Acute exacerbation of CHF (congestive heart failure)    Acute respiratory failure with hypoxia    Hypertension    Chronic kidney disease, stage III (moderate)    Hyperlipidemia    Hypoxia        Past Medical/Surgical History:  Past Medical History:   Diagnosis Date    Abnormal vision     Arthritis     Atrial fibrillation     Congestive heart failure     Glaucoma     Hip fx, right, closed, initial encounter 2010    Hyperlipidemia     Hypertension     Low back pain     Macular degeneration     Nonrheumatic aortic (valve) stenosis 08/11/2017    Shingles       Past Surgical History:   Procedure Laterality Date    APPENDECTOMY      EYE SURGERY      cateracts    HIP SURGERY Bilateral     Tubes tied    TONSILLECTOMY      TUBAL LIGATION           Occupational Profile:   Home Living Arrangements  Living Arrangements: Facility: [facility]  Assistance Available: Full time   Type of Home: Assisted living  Home Layout: One level  Bathroom:  standard toilet, toilet riser;  walk-in-shower, grab bars in shower, built-in shower seat, hand-held shower  DME Currently at Home: Toilet riser  Front wheeled walker    Prior Level of Function  Mobility: Mobility:  Modified independence  with  Front wheeled walker  Fall History: Denies any falls in past 6 months     Activities of Daily Living  Patient was independent with all ADLs    Instrumental Activities of Daily Living  Meal preparation & cleanup: eats in dinning room  Home establishment & maintenance: Laundry: Yes    Work  Retired    Advertising account planner for OT: return to [employer] concerns & priorities: return to [employer]    Subjective:   "I'm just so tired."  Patient is agreeable to participation in the therapy session. Nursing clears patient for therapy.    Pain:  At Rest: 0 /10  With Activity: 3/10  Location: Back:  Lower  Interventions:  Repositioned    ASSESSMENT OF OCCUPATIONAL PERFORMANCE:   Observation of Patient:    Patient is seated at edge of bed, with student RN with telemetry, continuous pulse oximeter, O2 at 3 liters/minute via nasal cannula            Vital Signs:   HR Sitting:  110 bpm  HR after activity: 100 bpm  SpO2 at rest: 95%  SpO2 with activity: 100%     Oriented to: Oriented x4  Command following: Follows ALL commands and directions without difficulty  Alertness/Arousal: Appropriate responses to stimuli   Attention Span:Appears intact  Memory: Appears intact  Safety Awareness: Independent  Insights: Fully aware of deficits  Problem Solving: supervision    Behavior: Cooperative    Musculoskeletal Examination:   Range of motion:  Bilateral  UE: Grossly WFL       Strength:  Bilateral UE: Grossly WFL         Sensory/Oculomotor Examination:   Auditory:  WFL=intact  Tactile-Light Touch:  WFL=intact  Visual Acuity:  wears glasses           Activities of Daily Living:   Eating:  Supervision/Set up, Simulated;   Grooming: Supervision/Set up, wash/dry hands, teeth care, denture care, seated in a chair  UB dressing:  Supervision/Set up, Simulated  LB dressing: Moderate assist, thread RLE into underwear, thread LLE into underwear, pull up over hips, pull down from hips, on BSC, standing  Bathing:  Minimal assist, Simulated;  Toileting:  Minimal assist;, clothing management up, clothing management down, perineal hygiene, bedside commode       Functional Mobility:     Transfers:  Sit to Stand:  Supervision with Front wheeled walker.        Stand to Sit:  Minimal assist.   Cues for Hand Placement  Chair: Minimal assist with Front wheeled walker.        BSC: Minimal assist with Front wheeled walker.          Balance:   Static Sitting:  Good  Dynamic Sitting:  Good  Static Standing:  Fair+  Dynamic Standing:  Fair-    Participation and Activity Tolerance   Participation effort: Good  Activity Tolerance: Tolerates 10-20 minutes of activity without  rest breaks          Education Provided:   Topics: Role of occupational therapy, plan of care, goals of therapy and safety with mobility and ADLs, benefits of activity, activity with nursing.    Individuals educated: Patient.  Method: Explanation.  Response to education: Verbalized understanding.    Team Communication:   OT communicated with: RN/LPN Irving Burton  OT communicated regarding: Pre-session re: patient status, Patient position at end of session, Patient participation with Therapy  OT/COTA communication: via written note and verbal communication as needed.    Sitting, in a chair, Needs in reach and No distress    Recommend client assist to chair for meals or BSC as needed outside of and in addition to OT session.    Loma Messing, MOTR/L

## 2019-01-12 NOTE — Progress Notes (Signed)
PROGRESS NOTE    Date Time: 01/12/19 11:45 AM  Patient Name: Kelli Brown, Kelli Brown  MRN:  [id number]    Assessment/Plan:   Active Problems:    Pneumonia    Acute exacerbation of CHF (congestive heart failure)    Acute respiratory failure with hypoxia    Hypertension    Chronic kidney disease, stage III (moderate)    Hyperlipidemia    Hypoxia    1.  Acute hypoxic respiratory failure-seems to significantly improve.  Now on 3 L.  Doing well.  2.  Interstitial lung disease-Solu-Medrol has been changed to prednisone 40.  Would consider a slow taper down over about 2 weeks.  3.  Pulmonary hypertension-chronic issue.  Diuresis as tolerates.  4.  Diarrhea- Present antibiotics.  C. Difficile seems reasonable    Subjective:   Breathing is doing well.  Complains that she feels weak.  She also claims diarrhea.  No blood in stools  The following portions of the patient's history were reviewed and updated as appropriate: allergies and current medications.    Physical Exam:     Vitals:    01/12/19 1019   BP: 120/55   Pulse: 92   Resp:    Temp:    SpO2:      Focused Exam:  General appearance - oriented to person, place, and time and chronically ill appearing  Mental status - alert, oriented to person, place, and time, normal mood, behavior, speech, dress, motor activity, and thought processes  Eyes - pupils equal and reactive, extraocular eye movements intact  Mouth - mucous membranes moist, pharynx normal without lesions  Neck - supple, no significant adenopathy  Lymphatics -   Chest - no tachypnea, retractions or cyanosis, Basilar crackles noted  Heart - normal rate, regular rhythm, normal S1, S2, no murmurs, rubs, clicks or gallops  Abdomen - soft, nontender, nondistended, no masses or organomegaly  Neurological - alert, oriented, normal speech, no focal findings or movement disorder noted, neck supple without rigidity, cranial nerves II through XII intact  Musculoskeletal -   Extremities - peripheral pulses normal, no pedal  edema, no clubbing or cyanosis  Skin -     Labs:     Results     Procedure Component Value Units Date/Time    CBC and differential [[id number]]  (Abnormal) Collected:  01/12/19 0855    Specimen:  Blood Updated:  01/12/19 1055     WBC 18.8 K/cmm      RBC 3.72 M/cmm      Hemoglobin 11.8 gm/dL      Hematocrit 29.5 %      MCV 102 fL      MCH 32 pg      MCHC 31 gm/dL      RDW 28.4 %      PLT CT 280 K/cmm      MPV 7.8 fL      NEUTROPHIL % 88.0 %      Lymphocytes 4.0 %      Monocytes 3.0 %      Eosinophils % 0.0 %      Basophils % 0.0 %      Bands 1 %      Myelocytes 4 %      Neutrophils Absolute 17.5 K/cmm      Lymphocytes Absolute 0.8 K/cmm      Monocytes Absolute 0.6 K/cmm      Eosinophils Absolute 0.0 K/cmm      BASO Absolute 0.0 K/cmm      RBC  Morphology RBC Morphology Reviewed     Anisocytosis 1+     Polychromasia 1+     Burr Cells 1+     Elliptocytes 1+     Target Cells 1+    Narrative:       Manual differential performed    Comprehensive metabolic panel [[id number]]  (Abnormal) Collected:  01/12/19 0855    Specimen:  Plasma Updated:  01/12/19 1007     Sodium 138 mMol/L      Potassium 5.5 mMol/L      Chloride 94 mMol/L      CO2 36 mMol/L      Calcium 9.1 mg/dL      Glucose 409 mg/dL      Creatinine 8.11 mg/dL      BUN 49 mg/dL      Protein, Total 6.1 gm/dL      Albumin 3.2 gm/dL      Alkaline Phosphatase 66 U/L      ALT 19 U/L      AST (SGOT) 18 U/L      Bilirubin, Total 1.1 mg/dL      Albumin/Globulin Ratio 1.10 Ratio      Anion Gap 13.5 mMol/L      BUN/Creatinine Ratio 40.5 Ratio      EGFR 38 mL/min/1.44m2      Osmolality Calculated 292 mOsm/kg      Globulin 2.9 gm/dL     Prothrombin time/INR [[id number]]  (Abnormal) Collected:  01/12/19 0431    Specimen:  Blood Updated:  01/12/19 0534     PT 21.9 sec      PT INR 2.2        Recent Labs   Lab 01/12/19  0855 01/09/19  0528 01/07/19  0959   Eosinophils Absolute 0.0 0.0 0.0       Rads:     Radiology Results (24 Hour)     ** No results found for the last 24 hours. **               Signed by: Teresita Madura, MD  Service:  @SERVICE @  Time/Date:  11:45 AM2/20/2020

## 2019-01-12 NOTE — Progress Notes (Signed)
Quick Doc  [facility] - GI/ENDO/GEN MED   Patient Name: Kelli Brown   Attending Physician: Esmond Harps, DO   Today's date:   01/12/2019 LOS: 12 days   Expected Discharge Date      Quick  Assessment:                                                              ReAdmit Risk Score: 25    CM Comments: 01/12/19 RNCM (PH). ADM Acute hypoxic resp failure, PNA, dCHF. weaned to NC 3L/min. Spoke w/pt and granddtr Kelli Brown today.  Pt needs SNF and agreeable to ones in the area except [place] and [place].  Granddtrs will be over weekend to see pt.  [facility] requested, anticipate discharge in 24/48hrs.  RNCM will cont to follow for discharge needs.                                                                                        Provider Notifications:     Working on [employer] referrals.  Anticipate ready for discharge in 24-48hr.       Helmuth Recupero A. Leonor Liv, RN MSN  Case Management  Ph: [phone]  Fax: [phone]

## 2019-01-12 NOTE — Progress Note - Problem Oriented Charting Notewrit (Signed)
Pharmacy Consult Warfarin Dosing  Kelli Brown    Age: 85 y.o.  Weight: 62.5 kg (137 lb 12.8 oz)  Indication: a fib  Goal INR: 2-3  Home warfarin dose =   2.5 mg daily except 5mg  Thurs and Sat  Interacting agent/disease: amiodarone, aspirin, methylprednisolone, Zosyn    Subjective/Objective:   [name] admitted from assisted living with hypoxia and lethargy. Takes warfarin as outpatient reportedly at above dosage, although it is not on patient's home med list. Per facility, home med list is not regularly updated. Received Vitamin K 5 mg po on 2/9 for elevated INR. Multivitamin started 2/14.     Assessment/Plan   INR 2.2 today   Warfarin 2.5 mg once tonight.    Multivitamin continues   Daily INR   Monitor for signs and symptoms of bleeding   Avoid IM injections    Current warfarin dose =(hold for INR greater than 3.5)  Date 2/8 2/9 2/10 2/11 2/12 2/13 2/14 2-15 2-16 2/17 2/18 2/19 2/20    INR 8.8 9.7 1.5 1.4 2.1 3.6 4.5 3.8 3.3 2.5 2 2.1 2.2    dose Hold hold 5 mg 5 mg Hold Hold Hold hold hold 2.5mg  2.5mg  2.5mg  2.5mg     Hgb 11.1 10.3 9.8 10.3 9.9   10.4  10.3       Plt 259 245 247 286 271   288  289           Past Medical History:   Diagnosis Date    Abnormal vision     Arthritis     Atrial fibrillation     Congestive heart failure     Glaucoma     Hip fx, right, closed, initial encounter 2010    Hyperlipidemia     Hypertension     Low back pain     Macular degeneration     Nonrheumatic aortic (valve) stenosis 08/11/2017    Shingles         Recent Labs   Lab 01/07/19  0959   Bilirubin, Total 1.2   Protein, Total 5.8*   Albumin 2.8*   ALT 18   AST (SGOT) 17       If you have any questions, please contact the pharmacist at [phone].    Kelli Brown, PharmD

## 2019-01-12 NOTE — Plan of Care (Addendum)
NURSE NOTE SUMMARY  [facility] - GI/ENDO/GEN MED   Patient Name: Kelli Brown   Attending Physician: Esmond Harps, DO   Today's date:   01/12/2019 LOS: 12 days   Shift Summary:                                                              Patient resting in bed with no complaints of pain. Assessed and medicated per MAR.  Bed alarm on, bed in lowest position. Patient denies additional needs at this time.     1143: Add warfarin at 1800. K 5.5: oral Kclor held, Sloan by MD. [place] Solumedrol, add prednisone PO. Lasix switched to PO.  Nystatin added for noted oral thrush.    Provider Notifications:      Rapid Response Notifications:  Mobility:      PMP Activity: Step 5 - Chair (01/11/2019  9:30 PM)     Weight tracking:  Family Dynamic:   Last 3 Weights for the past 72 hrs (Last 3 readings):   Weight   01/12/19 0400 62.5 kg (137 lb 12.8 oz)   01/10/19 0548 65.5 kg (144 lb 6.4 oz)             Recent Vitals Last Bowel Movement   BP: 129/61 (01/12/2019  4:00 AM)  Heart Rate: 80 (01/12/2019  4:00 AM)  Temp: 97.2 F (36.2 C) (01/12/2019  4:00 AM)  Resp Rate: 18 (01/12/2019  4:00 AM)  Weight: 62.5 kg (137 lb 12.8 oz) (01/12/2019  4:00 AM)  SpO2: 96 % (01/12/2019  4:00 AM)   Last BM Date: 01/11/19         Problem: Moderate/High Fall Risk Score >5  Goal: Patient will remain free of falls  Outcome: Progressing     Problem: Compromised Tissue integrity  Goal: Damaged tissue is healing and protected  Outcome: Progressing  Goal: Nutritional status is improving  Outcome: Progressing     Problem: Compromised Hemodynamic Status  Goal: Vital signs and fluid balance maintained/improved  Outcome: Progressing     Problem: Inadequate Gas Exchange  Goal: Adequate oxygenation and improved ventilation  Outcome: Progressing     Problem: Inadequate Airway Clearance  Goal: Normal respiratory rate/effort achieved/maintained  Outcome: Progressing

## 2019-01-13 LAB — CBC AND DIFFERENTIAL
Bands: 1 % (ref 0–10)
Basophils Absolute: 0 10*3/uL (ref 0.0–0.3)
Eosinophils Absolute: 0 10*3/uL (ref 0.0–0.8)
Hematocrit: 34.7 % — ABNORMAL LOW (ref 36.0–48.0)
Hemoglobin: 11.3 gm/dL — ABNORMAL LOW (ref 12.0–16.0)
Lymphocytes Absolute: 1.9 10*3/uL (ref 0.6–5.1)
Lymphocytes: 2 % — ABNORMAL LOW (ref 15.0–46.0)
MCH: 32 pg (ref 28–35)
MCHC: 33 gm/dL (ref 32–36)
MCV: 99 fL (ref 80–100)
MPV: 7.6 fL (ref 6.0–10.0)
Metamyelocytes: 1 % — ABNORMAL HIGH (ref 0–0)
Monocytes Absolute: 0.5 10*3/uL (ref 0.1–1.7)
Monocytes: 5 % (ref 3.0–15.0)
Myelocytes: 6 % — ABNORMAL HIGH (ref 0–0)
Neutrophils %: 84 % — ABNORMAL HIGH (ref 42.0–78.0)
Neutrophils Absolute: 13.5 10*3/uL — ABNORMAL HIGH (ref 1.7–8.6)
PLT CT: 243 10*3/uL (ref 130–440)
Promyelocytes: 1 % — ABNORMAL HIGH (ref 0–0)
RBC: 3.5 10*6/uL — ABNORMAL LOW (ref 3.80–5.00)
RDW: 17.5 % — ABNORMAL HIGH (ref 11.0–14.0)
WBC: 15.9 10*3/uL — ABNORMAL HIGH (ref 4.0–11.0)

## 2019-01-13 LAB — BASIC METABOLIC PANEL
Anion Gap: 15 mMol/L (ref 7.0–18.0)
BUN / Creatinine Ratio: 46.7 Ratio — ABNORMAL HIGH (ref 10.0–30.0)
BUN: 56 mg/dL — ABNORMAL HIGH (ref 7–22)
CO2: 32 mMol/L — ABNORMAL HIGH (ref 20–30)
Calcium: 8.5 mg/dL (ref 8.5–10.5)
Chloride: 94 mMol/L — ABNORMAL LOW (ref 98–110)
Creatinine: 1.2 mg/dL (ref 0.60–1.20)
EGFR: 38 mL/min/{1.73_m2} — ABNORMAL LOW (ref 60–150)
Glucose: 154 mg/dL — ABNORMAL HIGH (ref 71–99)
Osmolality Calculated: 292 mOsm/kg (ref 275–300)
Potassium: 4 mMol/L (ref 3.5–5.3)
Sodium: 137 mMol/L (ref 136–147)

## 2019-01-13 LAB — PT/INR
PT INR: 2.2 — ABNORMAL HIGH (ref 0.5–1.3)
PT: 22.5 s — ABNORMAL HIGH (ref 9.5–11.5)

## 2019-01-13 MED ORDER — WARFARIN SODIUM 2.5 MG PO TABS
2.50 mg | ORAL_TABLET | Freq: Every day | ORAL | Status: AC
Start: 2019-01-13 — End: 2019-01-13
  Administered 2019-01-13: 17:00:00 2.5 mg via ORAL
  Filled 2019-01-13: qty 1

## 2019-01-13 NOTE — Progress Note - Problem Oriented Charting Notewrit (Signed)
Pharmacy Consult Warfarin Dosing  Kelli Brown    Age: 85 y.o.  Weight: 62.1 kg (137 lb)  Indication: a fib  Goal INR: 2-3  Home warfarin dose =   2.5 mg daily except 5mg  Thurs and Sat  Interacting agent/disease: amiodarone, aspirin, methylprednisolone, Zosyn    Subjective/Objective:   [name] admitted from assisted living with hypoxia and lethargy. Takes warfarin as outpatient reportedly at above dosage, although it is not on patient's home med list. Per facility, home med list is not regularly updated. Received Vitamin K 5 mg po on 2/9 for elevated INR. Multivitamin started 2/14.     Assessment/Plan   INR 2.2 today   Warfarin 2.5 mg once tonight.    Multivitamin continues   Daily INR   Monitor for signs and symptoms of bleeding   Avoid IM injections    Current warfarin dose =(hold for INR greater than 3.5)  Date 2/8 2/9 2/10 2/11 2/12 2/13 2/14 2-15 2-16 2/17 2/18 2/19 2/20 2/21   INR 8.8 9.7 1.5 1.4 2.1 3.6 4.5 3.8 3.3 2.5 2 2.1 2.2 2.2   dose Hold hold 5 mg 5 mg Hold Hold Hold hold hold 2.5mg  2.5mg  2.5mg  2.5mg     Hgb 11.1 10.3 9.8 10.3 9.9   10.4  10.3       Plt 259 245 247 286 271   288  289           Past Medical History:   Diagnosis Date    Abnormal vision     Arthritis     Atrial fibrillation     Congestive heart failure     Glaucoma     Hip fx, right, closed, initial encounter 2010    Hyperlipidemia     Hypertension     Low back pain     Macular degeneration     Nonrheumatic aortic (valve) stenosis 08/11/2017    Shingles         Recent Labs   Lab 01/12/19  0855   Bilirubin, Total 1.1   Protein, Total 6.1   Albumin 3.2*   ALT 19   AST (SGOT) 18       If you have any questions, please contact the pharmacist at [phone].    [name], PharmD

## 2019-01-13 NOTE — Progress Notes (Signed)
01/13/19 1400   Visit Type   Visit Type Follow-up   Visit Source   Visit Source Chaplain Initiated   Present at Visit   Present at Visit Patient   Spiritual Assessment   Spiritual Assessment Unable to Assess (comment)  (Pt sleeping)   Spiritual Care Outcomes   Consulted With Nurse   Length of Visit   Length of Visit 0-15 minutes   Follow-Up   Follow-up Follow-up routine

## 2019-01-13 NOTE — Plan of Care (Addendum)
NURSE NOTE SUMMARY  [facility] - GI/ENDO/GEN MED   Patient Name: [facility] WHITE   Attending Physician: Esmond Harps, DO   Today's date:   01/13/2019 LOS: 13 days   Shift Summary:                                                              Patient resting in bed with no S/s pain. Patient assessed and medicated per MAR. Patient denies additional needs at this time.     0930: Pt may go back to [facility] Monday. Family is looking into facilities in [place] (closer to family), will update CMRN as needed. Weaned to [facility], 94% spo2. [place] bioK, add warfarin.    Provider Notifications:      Rapid Response Notifications:  Mobility:      PMP Activity: Step 6 - Walks in Room (01/13/2019  7:41 AM)     Weight tracking:  Family Dynamic:   Last 3 Weights for the past 72 hrs (Last 3 readings):   Weight   01/13/19 0343 62.1 kg (137 lb)   01/12/19 0400 62.5 kg (137 lb 12.8 oz)             Recent Vitals Last Bowel Movement   BP: 121/62 (01/13/2019  3:43 AM)  Heart Rate: 83 (01/13/2019  3:43 AM)  Temp: (!) 96.8 F (36 C) (01/13/2019  3:43 AM)  Resp Rate: 20 (01/13/2019  3:43 AM)  Weight: 62.1 kg (137 lb) (01/13/2019  3:43 AM)  SpO2: 100 % (01/13/2019  3:43 AM)   Last BM Date: 01/12/19         Problem: Moderate/High Fall Risk Score >5  Goal: Patient will remain free of falls  Outcome: Progressing     Problem: Compromised Tissue integrity  Goal: Damaged tissue is healing and protected  Outcome: Progressing  Goal: Nutritional status is improving  Outcome: Progressing     Problem: Compromised Hemodynamic Status  Goal: Vital signs and fluid balance maintained/improved  Outcome: Progressing     Problem: Inadequate Gas Exchange  Goal: Adequate oxygenation and improved ventilation  Outcome: Progressing     Problem: Inadequate Airway Clearance  Goal: Normal respiratory rate/effort achieved/maintained  Outcome: Progressing

## 2019-01-13 NOTE — Progress Notes (Signed)
Medicine Progress Note - [facility]      Date Time: 01/13/19 9:50 AM  Patient Name: Kelli Brown  Attending Physician: Esmond Harps, DO    Assessment:                                                                                        Active Problems:    Pneumonia    Acute exacerbation of CHF (congestive heart failure)    Acute respiratory failure with hypoxia    Hypertension    Chronic kidney disease, stage III (moderate)    Hyperlipidemia    Hypoxia        Plan:                                                                                                      Acute hypoxic respiratory failurelikelydue to pneumonia/diastolic heart failure, resolved  Probable multifactorial including interstitial lung disease, pulmonary hypertension, CHF, pneumonia/pneumonitis,possible amiodarone toxicity  Current O2 requirement:2L NC, continue to wean as tolerated   Prednisone 40 mg BID  Switched to oral lasix 40 mg BID  Incentive spirometry  PT/OT     Pneumonia/ Pneumonitis, resolved  CXR/CT: consistent with multifocal pneumonia/pneumonitis  Repeat imagingslow improvement of aeration   S/pzosyn x 7 days  Blood cx negative    Oral candidiasis  Nystatin     Acute on chronic diastolic heart failure/moderate aortic stenosis  Switch to oral lasix 40 mg BID  Strict I's/O, daily weights  Echo: LVH, stable aortic stenosis, moderate tricuspid regurg, moderate to severe pulmonary artery pressure    CKD stage III  Creatinine stable    Supra therapeutic INR, resolved  Coumadin per Pharm.D.    Mild anemia due to CKD  Better H&H    Paroxysmal atrial fibrillation  Metoprolol  Discontinued amiodarone  Coumadin/INRper Pharm.D.    HTN(hypertension):   Metoprolol    Hyperlipidemia  Continue with home meds    DVT ppx: coumadin- pharmacy to dose  Lines:                                                                                                       Patient  Lines/Drains/Airways Status    Active PICC Line / CVC Line / PIV Line / Drain / Airway /  Intraosseous Line / Epidural Line / ART Line / Line / Wound / Pressure Ulcer / NG/OG Tube     Name:   Placement date:   Placement time:   Site:   Days:    Peripheral IV 01/01/19 Right Forearm   01/01/19    0030    Forearm   12                Disposition:                                                                                             Today's date: 01/13/2019  Length of Stay: 13  Code Status: NO CPR - SUPPORT OK  Anticipated medical stability for discharge in : 2 days    Subjective                                                                                             CC: <principal problem not specified>     Complains of loose stools and mouth sores.       Review of Systems:                                                                               Review of Systems   Constitutional: Negative for chills and fever.   HENT: Positive for mouth sores. Negative for congestion and sore throat.    Respiratory: Negative for cough and shortness of breath.    Cardiovascular: Negative for chest pain and leg swelling.   Gastrointestinal: Positive for diarrhea. Negative for abdominal pain, nausea and vomiting.   Genitourinary: Negative for dysuria, frequency and urgency.   Musculoskeletal: Negative for arthralgias and myalgias.   Skin: Negative for pallor and rash.   Neurological: Negative for dizziness and headaches.       Physical Exam:                                                                                     Temp:  [96.8 F (36 C)-97.5 F (36.4 C)] 97.5 F (36.4 C)  Heart Rate:  [77-100] 77  Resp Rate:  [16-20]  16  BP: (117-130)/(55-75) 129/66    Intake/Output Summary (Last 24 hours) at 01/13/2019 0950  Last data filed at 01/13/2019 0600  Gross per 24 hour   Intake 840 ml   Output 450 ml   Net 390 ml       General: Awake, alert, oriented x 3; No acute distress.  HEENT: oral thrush with ulcers EOMI, Sclera  anicteric  Oropharynx clear without lesions, mucous membranes moist  Glands: No cervical or axillary lymphadenopathy.  Neck: Supple, no lymphadenopathy, no thyromegaly  Cardiovascular: RRR, no murmurs, rubs or gallops  Lungs: CTAB, without wheezes, rhonchi, or rales  Abdomen: Soft, non-tender, non-distended; no palpable masses, normoactive bowel sounds, no rebound or guarding  Extremities: No clubbing, cyanosis, or edema  Neuro: Cranial nerves grossly intact, strength 5/5 in upper and lower extremities, sensation intact  Psych: Normal affect, not depressed   Skin: No rashes or lesions noted    Meds:                                                                                                       Medications were reviewed in the electronic record: [x]       Estimated Creatinine Clearance: 22 mL/min (based on SCr of 1.2 mg/dL).  Current Facility-Administered Medications   Medication Dose Route Frequency   . albuterol  2.5 mg Nebulization 4x daily   . aspirin EC  81 mg Oral QAM   . dorzolamide  1 drop Left Eye TID   . famotidine  10 mg Oral Daily   . folic acid  1 mg Oral Daily   . furosemide  40 mg Oral BID   . guaiFENesin  600 mg Oral Q12H SCH   . loratadine  10 mg Oral Daily   . metoprolol succinate XL  50 mg Oral QAM   . multivitamin  1 tablet Oral Daily   . nystatin  500,000 Units Oral QID   . pravastatin  40 mg Oral QAM   . predniSONE  40 mg Oral BID Meals   . sodium chloride (PF)  3 mL Intravenous Q8H   . warfarin  2.5 mg Oral Daily at 1800   . warfarin therapy placeholder  1 each Does not apply See Admin Instructions     PRN medications: acetaminophen **OR** acetaminophen **OR** acetaminophen, albuterol, LORazepam, naloxone, ondansetron **OR** ondansetron, prochlorperazine  IV Drips:       Labs and Imaging:                                                                                 Results     Procedure Component Value Units Date/Time    CBC and differential [[id number]]  (Abnormal) Collected:  01/13/19  0522    Specimen:  Blood Updated:  01/13/19 0816     WBC 15.9 K/cmm      RBC 3.50 M/cmm      Hemoglobin 11.3 gm/dL      Hematocrit 16.1 %      MCV 99 fL      MCH 32 pg      MCHC 33 gm/dL      RDW 09.6 %      PLT CT 243 K/cmm      MPV 7.6 fL      NEUTROPHIL % 84.0 %      Lymphocytes 2.0 %      Monocytes 5.0 %      Bands 1 %      Metamyelocytes 1 %      Myelocytes 6 %      Promyelocyte 1 %      Neutrophils Absolute 13.5 K/cmm      Lymphocytes Absolute 1.9 K/cmm      Monocytes Absolute 0.5 K/cmm      Eosinophils Absolute 0.0 K/cmm      BASO Absolute 0.0 K/cmm      RBC Morphology RBC Morphology Reviewed     Anisocytosis 1+     Elliptocytes 1+     Target Cells 1+    Narrative:       Manual differential performed    Basic Metabolic Panel [[id number]]  (Abnormal) Collected:  01/13/19 0522    Specimen:  Plasma Updated:  01/13/19 0635     Sodium 137 mMol/L      Potassium 4.0 mMol/L      Chloride 94 mMol/L      CO2 32 mMol/L      Calcium 8.5 mg/dL      Glucose 914 mg/dL      Creatinine 7.82 mg/dL      BUN 56 mg/dL      Anion Gap 95.6 mMol/L      BUN/Creatinine Ratio 46.7 Ratio      EGFR 38 mL/min/1.76m2      Osmolality Calculated 292 mOsm/kg     Prothrombin time/INR [[id number]]  (Abnormal) Collected:  01/13/19 0522    Specimen:  Blood Updated:  01/13/19 0634     PT 22.5 sec      PT INR 2.2    CBC and differential [[id number]]  (Abnormal) Collected:  01/12/19 0855    Specimen:  Blood Updated:  01/12/19 1055     WBC 18.8 K/cmm      RBC 3.72 M/cmm      Hemoglobin 11.8 gm/dL      Hematocrit 41.3 %      MCV 102 fL      MCH 32 pg      MCHC 31 gm/dL      RDW 24.4 %      PLT CT 280 K/cmm      MPV 7.8 fL      NEUTROPHIL % 88.0 %      Lymphocytes 4.0 %      Monocytes 3.0 %      Eosinophils % 0.0 %      Basophils % 0.0 %      Bands 1 %      Myelocytes 4 %      Neutrophils Absolute 17.5 K/cmm      Lymphocytes Absolute 0.8 K/cmm      Monocytes Absolute 0.6 K/cmm      Eosinophils Absolute 0.0 K/cmm      BASO Absolute 0.0 K/cmm      RBC  Morphology  RBC Morphology Reviewed     Anisocytosis 1+     Polychromasia 1+     Burr Cells 1+     Elliptocytes 1+     Target Cells 1+    Narrative:       Manual differential performed    Comprehensive metabolic panel [[id number]]  (Abnormal) Collected:  01/12/19 0855    Specimen:  Plasma Updated:  01/12/19 1007     Sodium 138 mMol/L      Potassium 5.5 mMol/L      Chloride 94 mMol/L      CO2 36 mMol/L      Calcium 9.1 mg/dL      Glucose 409 mg/dL      Creatinine 8.11 mg/dL      BUN 49 mg/dL      Protein, Total 6.1 gm/dL      Albumin 3.2 gm/dL      Alkaline Phosphatase 66 U/L      ALT 19 U/L      AST (SGOT) 18 U/L      Bilirubin, Total 1.1 mg/dL      Albumin/Globulin Ratio 1.10 Ratio      Anion Gap 13.5 mMol/L      BUN/Creatinine Ratio 40.5 Ratio      EGFR 38 mL/min/1.11m2      Osmolality Calculated 292 mOsm/kg      Globulin 2.9 gm/dL           Microbiology, reviewed and are significant for:  Microbiology Results     Procedure Component Value Units Date/Time    Blood Culture [[id number]] Collected:  01/02/19 1300    Specimen:  Blood from Venipuncture Updated:  01/07/19 1313    Narrative:       Specimen/Source: Blood/Venipuncture  Collected: 01/02/2019 13:00     Status: Final      Last Updated: 01/07/2019 13:10                Culture Result (Final)      No Growth in 5 Days          Blood Culture [[id number]] Collected:  01/02/19 1300    Specimen:  Blood from Venipuncture Updated:  01/07/19 1313    Narrative:       Specimen/Source: Blood/Venipuncture  Collected: 01/02/2019 13:00     Status: Final      Last Updated: 01/07/2019 13:10                Culture Result (Final)      No Growth in 5 Days          Influenza A / B Rapid Test [[id number]] Collected:  12/31/18 1108    Specimen:  Nasal Wash Updated:  12/31/18 1143     Influenza A Negative     Influenza B Negative     Comment: Method: Jarvis Morgan    The sensitivity for this method is between 90% and 95% for Influenza A and around 90% for Influenza B. The specificity for  both Influenza A and B is around 96%. False positive results may occur, especially when the prevalence of Influenza activity is low. This is more likely with Influenza B due to its lower prevalence. Clinical conditions, including the prevalence of influenza activity, should be considered in the interpretation of results. If clinically indicated, results may be confirmed with PCR testing.  The above 2 analytes were performed by [facility] Main Lab [id number])  [address],[place] [address]  Narrative:       Influenza A antigen detection tests are unable to distinquish between novel and seasonal influenza A.    A negative result for either Influenza A or B antigen does not exclude influenza virus infection. Clinical correlation required.    All positive influenza antigen tests (A or B) require placement of patient on droplet precaution isolation.    Nares MRSA Detection by DNA Amplification [[id number]] Collected:  01/04/19 0224    Specimen:  Nares Updated:  01/04/19 0415     MRSA No MRSA detected.     Comment: The above 1 analytes were performed by [facility] Lab [id number])  [address],[place] [address]               Imaging, reviewed and are significant for:  Radiology Results (24 Hour)     ** No results found for the last 24 hours. **              Signed:  Darral Dash, MD   SVRP R1, [phone]

## 2019-01-13 NOTE — Plan of Care (Signed)
NURSE NOTE SUMMARY  [facility] - GI/ENDO/GEN MED   Patient Name: [facility] WHITE   Attending Physician: Esmond Harps, DO   Today's date:   01/13/2019 LOS: 13 days   Shift Summary:                                                              Assumed care at 1900. Meds given per MAR. No cc of pain. O2 sats WNL overnight on 3L, de-sats on monitor d/t repositioning and pulse ox constantly becoming loose. Bed alarm on. Bedside table and call bell within reach.    Provider Notifications:      Rapid Response Notifications:  Mobility:      PMP Activity: Step 6 - Walks in Room (01/12/2019  9:00 PM)     Weight tracking:  Family Dynamic:   Last 3 Weights for the past 72 hrs (Last 3 readings):   Weight   01/12/19 0400 62.5 kg (137 lb 12.8 oz)   01/10/19 0548 65.5 kg (144 lb 6.4 oz)             Recent Vitals Last Bowel Movement   BP: 127/65 (01/12/2019 11:00 PM)  Heart Rate: 85 (01/12/2019 11:00 PM)  Temp: 97.1 F (36.2 C) (01/12/2019 11:00 PM)  Resp Rate: 18 (01/12/2019 11:00 PM)  Weight: 62.5 kg (137 lb 12.8 oz) (01/12/2019  4:00 AM)  SpO2: 98 % (01/12/2019 11:00 PM)   Last BM Date: 01/12/19        Problem: Moderate/High Fall Risk Score >5  Goal: Patient will remain free of falls  Outcome: Progressing     Problem: Compromised Tissue integrity  Goal: Damaged tissue is healing and protected  Description  Interventions:  1. Monitor/assess Braden scale every shift  2. Provide wound care per wound care algorithm  3. Reposition patient every 2 hours and as needed unless able to reposition self  4. Increase activity as tolerated/progressive mobility  5. Relieve pressure to bony prominences for patients at moderate and high risk  6. Avoid shearing injuries   7. Keep intact skin clean and dry  8. Use bath wipes, not soap and water, for daily bathing   9. Use incontinence wipes for cleaning urine, stool and caustic drainage; Foley care as needed   10. Monitor external devices/tubes for correct placement to prevent  pressure, friction and shearing   11. Encourage use of lotion/moisturizer on skin  12. Monitor patient's hygiene practices  13. Consult/collaborate with wound care nurse   14. Utilize specialty bed  15. Consider placing an indwelling catheter if incontinence interferes with healing of stage 3 or 4 pressure injury  Outcome: Progressing  Goal: Nutritional status is improving  Description  Interventions:  1. Assist patient with eating   2. Allow adequate time for meals   3. Encourage patient to take dietary supplement(s) as ordered   4. Collaborate with Clinical Nutritionist  5. Include patient/patient care companion in decisions related to nutrition  Outcome: Progressing     Problem: Compromised Hemodynamic Status  Goal: Vital signs and fluid balance maintained/improved  Description  Interventions:  1. Position patient for maximum circulation / cardiac output  2. Monitor and assess vitals and hemodynamic parameters with position changes  3. Monitor and compare daily weight  4. Monitor intake  and output.  Notify LIP if urine output is less than 30 mL/hour   5. Monitor/assess lab values and report abnormal values  Outcome: Progressing     Problem: Inadequate Gas Exchange  Goal: Adequate oxygenation and improved ventilation  Description  Interventions  1. Assess lung sounds   2. Monitor SpO2 and treat as needed  3. Monitor and treat ETCO2  4. Provide mechanical and oxygen support to facilitate gas exchange  5. Position for maximum ventilatory efficiency  6. Teach/reinforce use of incentive spirometer 10 times per hour while awake, cough and deep breath as needed  7. Plan activities to conserve energy: plan rest periods  8. Increase activity as tolerated/progressive mobility  9. Consult/collaborate with Respiratory Therapy  Outcome: Progressing     Problem: Inadequate Airway Clearance  Goal: Normal respiratory rate/effort achieved/maintained  Description  Interventions:  1. Plan activities to conserve energy: plan rest  periods  Outcome: Progressing

## 2019-01-13 NOTE — PT Progress Note (Signed)
PT Progress Note  Physical Therapy PROGRESS note                             VHS: [facility]  Patient: Kelli Brown     CSN: [id number]    Bed: 528/528-A    Visit#: 2   Treatment Frequency: 4-5x/wk  Last seen by a physical therapist vs. Physical therapist assistant: 01/13/2019    DISCHARGE RECOMMENDATIONS   Discharge Recommendations:   SNF(versus return to ALF with full time supervision/assist)      Unclear if ALF staff can provide this level of assist, if so recommend HHPT as well   *Discharge recommendations are subject to change based on patient's progress and/or home support changes - please refer to most recent PT note for current recommendation    DME recommended for Discharge:   Has needed equipment    PMP (Progressive Mobility Program) Recommendations:   Recommend patient  transfer to chair/BSC 2-3 times/day with Wheeled walker and physical assist and/or supervision of 1 staff, out of bed to chair for meals as tolerated.     Precautions and Contraindications:   Falls  Mobility protocol     PT Assessment and Plan of Care (Treatment frequency noted above):     HPI (per physician charting) and Pertinent Medical Details:  Admitted 12/31/2018 with/for shortness of breath and weakness. Diagnosis: acute exacerbation of CHF; respiratory failure with hypoxia. Pt with stage III CKD as well    Goals:    Short-term Goals (2-4 visits):  1. Patient will perform bed mobility with min A in prep for EOB activity. MET  2. Patient will perform sit/stand with min A with least restrictive assistive device in prep for ambulation. MET   3. Patient will perform stand pivot transfer with min A with least restrictive assistive device in order to transfer to chair. MET  4. Patient will ambulate 10' with min A with least restrictive assistive device in prep for household ambulation. MET    Long-term Goals (by discharge):  1. Patient will perform bed mobility with supervision in prep for EOB activity.  ONGOING  2. Patient will perform sit/stand with supervision with least restrictive assistive device  in prep for ambulation. ONGOING  3. Patient will ambulate 32' with supervision with least restrictive assistive device in prep for household ambulation. ONGOING       PT Assessment:  Patients progress towards established goals: Patient with good progress towards established goals demonstrated by patient being able to ambulate 15'x2 with seated rest break between bouts. Also able to perform bed mobility with Min A and supine and seated LE exercises without difficulty. Patient's family present and interested in being able to have patient live/move to an ALF in [place] sometime soon.      Treatment plan: Continue plan of care     Subjective:   "That was a good little work out" pt stated about ambulating    Patient/family/caregiver consent to therapy session is noted by the participation in the therapy session.    Pain:  At Rest: 0 /10  With Activity: 1, 2/10  Location: Hip:  bilaterally  Interventions: Repositioned    OBJECTIVE:   Observation of Patient/Vital Signs:   Patient is in bed with Bed/chair alarm on, telemetry, continuous pulse oximeter, family present at bedside   Patients medical condition is appropriate for Physical therapy intervention at this time.    Vital Signs:  SpO2 at  rest: 97%  SpO2 with activity: 90-94%  on supplemental O2- 1 LPM            Oriented to: Oriented x4  Command following: Follows ALL commands and directions without difficulty  Alertness/Arousal: Appropriate responses to stimuli   Attention Span:Appears intact    Musculoskeletal and Balance Details:   Balance:  Static Sitting:  Fair+  Dynamic Sitting:  Fair  Static Standing:  Fair+  Dynamic Standing:  Fair          Bed Mobility:   Supine to Sit:   Minimal assist to the right.   HOB elevated, Bed rail used, assist at trunk  Sit to Supine:   Minimal assist.   HOB elevated, assist at LE(s)    Transfers:  Sit to Stand:  CGAx1 with  Front wheeled walker.    Cues for Hand Placement  Stand to Sit:  CGAx1.    Cues for Sequencing, Cues for Hand Placement, Cues for Walker Management, Comments: cues for backing up to surface completely prior to sitting   Stand Pivot:   CGA x1  with Front wheeled walker.   Cues for Sequencing, Cues for Hand Placement    Locomotion:  LEVEL AMBULATION:  Distance: 15'x2   Assistance level:  Close supervision to CGA   Device:  Front wheeled walker  Pattern:  Step through, Decreased cadence, Decreased step length:  bilaterally  Distance limited by: fatigue; seated rest break between bouts    Other Treatment Interventions this session:   Therapeutic exercise:  Supine: Straight leg raise: x10 bil LE  Hip add/abd slides: x10 bil LE  Bridges: x10   Sitting:  Long Arc Quads:  x10 bil LE   Marching:  x10 bil LE   Heel/Toe raises:  x10 bil LE   Ambulation for aerobic exercise  Therapeutic activity  Patient/family/caregiver education     Education Provided:   TOPICS: role of physical therapy, plan of care, goals of therapy and HEP, safety with mobility and ADLs, benefits of activity, energy conservation techniques, pursed lip breathing, home safety, use of adaptive equipment, activity with nursing    Learner educated: Patient, Family  Method: Explanation  Response to education: Verbalized understanding    Patient Position at End of Treatment:   Supine, in bed, Family/visitors present, Needs in reach, Bed/chair alarm set and No distress    Team Communication:   Spoke to : RN/LPN Irving Burton  Regarding: Pre-session re: patient status, Patient position at end of session, Patient participation with Therapy, Progressive Mobility Program recommendations  Whiteboard updated: No  PT/PTA communication: via written note and verbal communication as needed.    Time of treatment:   Time Calculation  PT Received On: 01/13/19  Start Time: 1559  Stop Time: 1623  Time Calculation (min): 24 min    Berneice Heinrich, PT, DPT

## 2019-01-13 NOTE — Progress Notes (Signed)
Quick Doc  [facility] - GI/ENDO/GEN MED   Patient Name: [facility] WHITE   Attending Physician: Esmond Harps, DO   Today's date:   01/13/2019 LOS: 13 days   Expected Discharge Date      Quick  Assessment:                                                              ReAdmit Risk Score: 24    CM Comments: 01/13/19 RNCM (PH) ADM acute hypoxic resp failure,  PNA.  dCHF. NC 3L/min. Updated pt and granddtr's on bed offers.  They have decided to take the bed at [facility], however, we are waiting for [employer] to be approved and MD has DMAS 96 to sign (in shadow chart for Dr. Pollie Friar).  Anticipate ready on monday.                                                                                        Provider Notifications:        Vanice Sarah. Leonor Liv, RN MSN  Case Management  Ph: [phone]  Fax: [phone]

## 2019-01-14 LAB — CBC AND DIFFERENTIAL
Bands: 2 % (ref 0–10)
Basophils Absolute: 0 10*3/uL (ref 0.0–0.3)
Eosinophils Absolute: 0 10*3/uL (ref 0.0–0.8)
Hematocrit: 35.2 % — ABNORMAL LOW (ref 36.0–48.0)
Hemoglobin: 11.4 gm/dL — ABNORMAL LOW (ref 12.0–16.0)
Lymphocytes Absolute: 1.4 10*3/uL (ref 0.6–5.1)
Lymphocytes: 7 % — ABNORMAL LOW (ref 15.0–46.0)
MCH: 32 pg (ref 28–35)
MCHC: 33 gm/dL (ref 32–36)
MCV: 99 fL (ref 80–100)
MPV: 7.5 fL (ref 6.0–10.0)
Metamyelocytes: 1 % — ABNORMAL HIGH (ref 0–0)
Monocytes Absolute: 0 10*3/uL — ABNORMAL LOW (ref 0.1–1.7)
Myelocytes: 3 % — ABNORMAL HIGH (ref 0–0)
Neutrophils %: 87 % — ABNORMAL HIGH (ref 42.0–78.0)
Neutrophils Absolute: 18 10*3/uL — ABNORMAL HIGH (ref 1.7–8.6)
PLT CT: 268 10*3/uL (ref 130–440)
RBC: 3.55 10*6/uL — ABNORMAL LOW (ref 3.80–5.00)
RDW: 17.4 % — ABNORMAL HIGH (ref 11.0–14.0)
WBC: 19.4 10*3/uL — ABNORMAL HIGH (ref 4.0–11.0)

## 2019-01-14 LAB — BASIC METABOLIC PANEL
Anion Gap: 10.8 mMol/L (ref 7.0–18.0)
BUN / Creatinine Ratio: 41.5 Ratio — ABNORMAL HIGH (ref 10.0–30.0)
BUN: 51 mg/dL — ABNORMAL HIGH (ref 7–22)
CO2: 37 mMol/L — ABNORMAL HIGH (ref 20–30)
Calcium: 8.6 mg/dL (ref 8.5–10.5)
Chloride: 94 mMol/L — ABNORMAL LOW (ref 98–110)
Creatinine: 1.23 mg/dL — ABNORMAL HIGH (ref 0.60–1.20)
EGFR: 37 mL/min/{1.73_m2} — ABNORMAL LOW (ref 60–150)
Glucose: 151 mg/dL — ABNORMAL HIGH (ref 71–99)
Osmolality Calculated: 292 mOsm/kg (ref 275–300)
Potassium: 3.8 mMol/L (ref 3.5–5.3)
Sodium: 138 mMol/L (ref 136–147)

## 2019-01-14 LAB — PT/INR
PT INR: 2.3 — ABNORMAL HIGH (ref 0.5–1.3)
PT: 23 s — ABNORMAL HIGH (ref 9.5–11.5)

## 2019-01-14 MED ORDER — WARFARIN SODIUM 2.5 MG PO TABS
2.5000 mg | ORAL_TABLET | Freq: Every day | ORAL | Status: DC
Start: 2019-01-14 — End: 2019-01-15
  Administered 2019-01-14: 18:00:00 2.5 mg via ORAL
  Filled 2019-01-14 (×2): qty 1

## 2019-01-14 MED ORDER — PREDNISONE 20 MG PO TABS
60.0000 mg | ORAL_TABLET | Freq: Every morning | ORAL | Status: DC
Start: 2019-01-15 — End: 2019-01-16
  Administered 2019-01-15 – 2019-01-16 (×2): 60 mg via ORAL
  Filled 2019-01-14 (×2): qty 3

## 2019-01-14 MED ORDER — MAGIC MOUTHWASH ORAL SUSPENSION
15.00 mL | Freq: Four times a day (QID) | ORAL | Status: DC
Start: 2019-01-14 — End: 2019-01-18
  Administered 2019-01-14 – 2019-01-18 (×15): 15 mL via ORAL
  Filled 2019-01-14 (×19): qty 15

## 2019-01-14 MED ORDER — PREDNISONE 20 MG PO TABS
60.0000 mg | ORAL_TABLET | Freq: Every morning | ORAL | Status: DC
Start: 2019-01-14 — End: 2019-01-14
  Filled 2019-01-14: qty 3

## 2019-01-14 MED ORDER — PREDNISONE 20 MG PO TABS
20.00 mg | ORAL_TABLET | Freq: Once | ORAL | Status: AC
Start: 2019-01-14 — End: 2019-01-14
  Administered 2019-01-14: 13:00:00 20 mg via ORAL
  Filled 2019-01-14 (×2): qty 1

## 2019-01-14 NOTE — Progress Note - Problem Oriented Charting Notewrit (Addendum)
Pharmacy Consult Warfarin Dosing  Kelli Brown    Age: 85 y.o.  Weight: 63.5 kg (139 lb 14.4 oz)  Indication: a fib  Goal INR: 2-3  Home warfarin dose =   2.5 mg daily except 5mg  Thurs and Sat  Interacting agent/disease: amiodarone, aspirin, methylprednisolone, Zosyn    Subjective/Objective:   [name] admitted from assisted living with hypoxia and lethargy. Takes warfarin as outpatient reportedly at above dosage, although it is not on patient's home med list. Per facility, home med list is not regularly updated. Received Vitamin K 5 mg po on 2/9 for elevated INR. Multivitamin started 2/14.     Assessment/Plan  . INR 2.3 today  . Warfarin 2.5 mg daily  . Multivitamin continues  . Daily INR  . Monitor for signs and symptoms of bleeding  . Avoid IM injections    Current warfarin dose =(hold for INR greater than 3.5)  Date 2/8 2/9 2/10 2/11 2/12 2/13 2/14 2-15 2-16 2/17 2/18 2/19 2/20 2/21   INR 8.8 9.7 1.5 1.4 2.1 3.6 4.5 3.8 3.3 2.5 2 2.1 2.2 2.2   dose Hold hold 5 mg 5 mg Hold Hold Hold hold hold 2.5mg  2.5mg  2.5mg  2.5mg  2.5 mg   Hgb 11.1 10.3 9.8 10.3 9.9   10.4  10.3       Plt 259 245 247 286 271   288  289         Date 2/22                INR 2.3                dose 2.5                Hgb                 Plt                     Past Medical History:   Diagnosis Date   . Abnormal vision    . Arthritis    . Atrial fibrillation    . Congestive heart failure    . Glaucoma    . Hip fx, right, closed, initial encounter 2010   . Hyperlipidemia    . Hypertension    . Low back pain    . Macular degeneration    . Nonrheumatic aortic (valve) stenosis 08/11/2017   . Shingles         Recent Labs   Lab 01/12/19  0855   Bilirubin, Total 1.1   Protein, Total 6.1   Albumin 3.2*   ALT 19   AST (SGOT) 18       If you have any questions, please contact the pharmacist at [phone].    [name], PharmD

## 2019-01-14 NOTE — Plan of Care (Signed)
NURSE NOTE SUMMARY  [facility] - GI/ENDO/GEN MED   Patient Name: [facility] WHITE   Attending Physician: Esmond Harps, DO   Today's date:   01/14/2019 LOS: 14 days   Shift Summary:                                                              1900 Assumed care of patient. Patient currently resting in bed. Respiration easy and nonlabored.     2000 Assisted pt to sink to brush teeth, wash face, and to use bedside commode. Pt calm at this time. No pain, no SOB. Call bell within reach. Bed alarm on.    Provider Notifications:      Rapid Response Notifications:  Mobility:      PMP Activity: Step 6 - Walks in Room (01/14/2019 10:09 PM)     Weight tracking:  Family Dynamic:   Last 3 Weights for the past 72 hrs (Last 3 readings):   Weight   01/14/19 0443 63.5 kg (139 lb 14.4 oz)   01/13/19 0343 62.1 kg (137 lb)   01/12/19 0400 62.5 kg (137 lb 12.8 oz)             Recent Vitals Last Bowel Movement   BP: 109/56 (01/14/2019  3:38 PM)  Heart Rate: 82 (01/14/2019  3:38 PM)  Temp: 98.2 F (36.8 C) (01/14/2019  3:38 PM)  Resp Rate: 18 (01/14/2019  3:38 PM)  Weight: 63.5 kg (139 lb 14.4 oz) (01/14/2019  4:43 AM)  SpO2: 95 % (01/14/2019  3:38 PM)   Last BM Date: 01/13/19        Problem: Moderate/High Fall Risk Score >5  Goal: Patient will remain free of falls  Outcome: Progressing  Flowsheets  Taken 01/13/2019 2344 by Deretha Emory, RN  High (Greater than 13): LOW-Fall Interventions Appropriate for Low Fall Risk;LOW-Anticoagulation education for injury risk;MOD-Use of chair-pad alarm when appropriate;MOD-Consider a move closer to Nurses Station;MOD-Remain with patient during toileting;MOD-Place Fall Risk level on whiteboard in room;HIGH-Utilize chair pad alarm for patient while in the chair;HIGH-Apply yellow "Fall Risk" arm band  Taken 01/10/2019 1939 by Elita Quick, RN  VH Moderate Risk (6-13): ALL REQUIRED LOW INTERVENTIONS;INITIATE YELLOW "FALL RISK" SIGNAGE;YELLOW NON-SKID SLIPPERS;YELLOW "FALL RISK" ARM  BAND;USE OF BED EXIT ALARM IF PATIENT IS CONFUSED OR IMPULSIVE. PLACE RESET BED ALARM SIGN ABOVE BED;PLACE FALL RISK LEVEL ON WHITE BOARD FOR COMMUNICATION PURPOSES IN PATIENT'S ROOM     Problem: Compromised Tissue integrity  Goal: Damaged tissue is healing and protected  Outcome: Progressing  Flowsheets (Taken 01/07/2019 1256 by Caryl Never, RN)  Damaged tissue is healing and protected : Monitor/assess Braden scale every shift;Increase activity as tolerated/progressive mobility;Reposition patient every 2 hours and as needed unless able to reposition self;Relieve pressure to bony prominences for patients at moderate and high risk;Avoid shearing injuries;Keep intact skin clean and dry;Use incontinence wipes for cleaning urine, stool and caustic drainage. Foley care as needed;Monitor external devices/tubes for correct placement to prevent pressure, friction and shearing;Encourage use of lotion/moisturizer on skin;Monitor patient's hygiene practices  Goal: Nutritional status is improving  Outcome: Progressing  Flowsheets (Taken 01/07/2019 1256 by Caryl Never, RN)  Nutritional status is improving: Assist patient with eating;Allow adequate time for meals;Include patient/patient care companion in decisions related to  nutrition     Problem: Compromised Hemodynamic Status  Goal: Vital signs and fluid balance maintained/improved  Outcome: Progressing  Flowsheets (Taken 01/11/2019 1138 by Senaida Lange, RN)  Vital signs and fluid balance are maintained/improved: Position patient for maximum circulation/cardiac output;Monitor/assess vitals and hemodynamic parameters with position changes;Monitor/assess lab values and report abnormal values;Monitor intake and output. Notify LIP if urine output is less than 30 mL/hour.     Problem: Inadequate Gas Exchange  Goal: Adequate oxygenation and improved ventilation  Outcome: Progressing  Flowsheets (Taken 01/11/2019 1138 by Senaida Lange, RN)  Adequate  oxygenation and improved ventilation: Assess lung sounds;Monitor SpO2 and treat as needed;Consult/collaborate with Respiratory Therapy;Increase activity as tolerated/progressive mobility;Position for maximum ventilatory efficiency;Plan activities to conserve energy: plan rest periods     Problem: Inadequate Airway Clearance  Goal: Normal respiratory rate/effort achieved/maintained  Outcome: Progressing  Flowsheets (Taken 01/11/2019 1138 by Senaida Lange, RN)  Normal respiratory rate/effort achieved/maintained: Plan activities to conserve energy: plan rest periods

## 2019-01-14 NOTE — Plan of Care (Addendum)
NURSE NOTE SUMMARY  [facility] - GI/ENDO/GEN MED   Patient Name: Kelli Brown   Attending Physician: Esmond Harps, DO   Today's date:   01/14/2019 LOS: 14 days   Shift Summary:                                                              Patient resting in bed with no complaints of pain. Medicated per MAR. Assessed. Patient denies additional needs at this time. Granddaughters at the bedside.     1600: decreased prednisone to 60mg  per day (every morning). [place] nystatin, add magic mouthwash. Patient up to commode with assistance     Provider Notifications:      Rapid Response Notifications:  Mobility:      PMP Activity: Step 6 - Walks in Room (01/14/2019  8:54 AM)     Weight tracking:  Family Dynamic:   Last 3 Weights for the past 72 hrs (Last 3 readings):   Weight   01/14/19 0443 63.5 kg (139 lb 14.4 oz)   01/13/19 0343 62.1 kg (137 lb)   01/12/19 0400 62.5 kg (137 lb 12.8 oz)             Recent Vitals Last Bowel Movement   BP: 130/63 (01/14/2019  8:09 AM)  Heart Rate: 80 (01/14/2019  8:09 AM)  Temp: 97.9 F (36.6 C) (01/14/2019  8:09 AM)  Resp Rate: 18 (01/14/2019  8:09 AM)  Weight: 63.5 kg (139 lb 14.4 oz) (01/14/2019  4:43 AM)  SpO2: 95 % (01/14/2019  8:09 AM)   Last BM Date: 01/13/19         Problem: Moderate/High Fall Risk Score >5  Goal: Patient will remain free of falls  Outcome: Progressing     Problem: Compromised Tissue integrity  Goal: Damaged tissue is healing and protected  Outcome: Progressing  Goal: Nutritional status is improving  Outcome: Progressing     Problem: Compromised Hemodynamic Status  Goal: Vital signs and fluid balance maintained/improved  Outcome: Progressing     Problem: Inadequate Gas Exchange  Goal: Adequate oxygenation and improved ventilation  Outcome: Progressing     Problem: Inadequate Airway Clearance  Goal: Normal respiratory rate/effort achieved/maintained  Outcome: Progressing

## 2019-01-14 NOTE — Progress Notes (Signed)
PROGRESS NOTE    Date Time: 01/14/19 10:33 AM  Patient Name: Kelli Brown, Kelli Brown  MRN:  [id number]    Assessment/Plan:   Active Problems:    Pneumonia    Acute exacerbation of CHF (congestive heart failure)    Acute respiratory failure with hypoxia    Hypertension    Chronic kidney disease, stage III (moderate)    Hyperlipidemia    Hypoxia    1.  Hypoxia-significantly improved.  Now down to 1 L nasal cannula.  Continue to wean as tolerates.  2.  Pulmonary hypertension-continue diuresis.  BUN and creatinine stable.  3.  Interstitial lung disease-decrease prednisone from 80 mg to 60 mg a day.  Decrease/wean over 2-week timeframe.  4.  [place] nystatin.  Start Magic mouthwash which has nystatin in it    Subjective:   Complains that her tongue hurts.  Anxious to get out of the hospital.  Thinks she is going to rehab on Monday  The following portions of the patient's history were reviewed and updated as appropriate: allergies and current medications.    Physical Exam:     Vitals:    01/14/19 0809   BP: 130/63   Pulse: 80   Resp: 18   Temp: 97.9 F (36.6 C)   SpO2: 95%     Focused Exam:  General appearance - oriented to person, place, and time, acyanotic, in no respiratory distress and chronically ill appearing  Mental status - alert, oriented to person, place, and time, normal mood, behavior, speech, dress, motor activity, and thought processes  Eyes - pupils equal and reactive, extraocular eye movements intact, sclera anicteric  Mouth - Thrush noted on the sides of her tongue  Neck - supple, no significant adenopathy  Lymphatics -   Chest - no tachypnea, retractions or cyanosis, Mild basilar crackles noted  Heart - normal rate and regular rhythm, Systolic murmur noted  Abdomen - soft, nontender, nondistended, no masses or organomegaly  Neurological - alert, oriented, normal speech, no focal findings or movement disorder noted, neck supple without rigidity, cranial nerves II through XII intact  Musculoskeletal -    Extremities - peripheral pulses normal, no pedal edema, no clubbing or cyanosis  Skin -     Labs:     Results     Procedure Component Value Units Date/Time    CBC and differential [[id number]]  (Abnormal) Collected:  01/14/19 0905    Specimen:  Blood Updated:  01/14/19 0958     RBC Morphology PENDING     WBC 19.4 K/cmm      RBC 3.55 M/cmm      Hemoglobin 11.4 gm/dL      Hematocrit 47.8 %      MCV 99 fL      MCH 32 pg      MCHC 33 gm/dL      RDW 29.5 %      PLT CT 268 K/cmm      MPV 7.5 fL     Narrative:       Manual differential performed    Basic Metabolic Panel [[id number]]  (Abnormal) Collected:  01/14/19 0905    Specimen:  Plasma Updated:  01/14/19 0950     Sodium 138 mMol/L      Potassium 3.8 mMol/L      Chloride 94 mMol/L      CO2 37 mMol/L      Calcium 8.6 mg/dL      Glucose 846 mg/dL      Creatinine 9.62 mg/dL  BUN 51 mg/dL      Anion Gap 16.1 mMol/L      BUN/Creatinine Ratio 41.5 Ratio      EGFR 37 mL/min/1.40m2      Osmolality Calculated 292 mOsm/kg     Prothrombin time/INR [[id number]]  (Abnormal) Collected:  01/14/19 0438    Specimen:  Blood Updated:  01/14/19 0545     PT 23.0 sec      PT INR 2.3        Recent Labs   Lab 01/13/19  0522 01/12/19  0855 01/09/19  0528   Eosinophils Absolute 0.0 0.0 0.0       Rads:     Radiology Results (24 Hour)     ** No results found for the last 24 hours. **              Signed by: Teresita Madura, MD  Service:  @SERVICE @  Time/Date:  10:33 AM2/22/2020

## 2019-01-14 NOTE — Progress Notes (Signed)
Medicine Progress Note   [facility]   Patient Name: Kelli Brown, Kelli Brown WHITE LOS: 14 days   Attending Physician: Esmond Harps, DO PCP: Flossie Dibble, MD      Hospital Course:                                                            Kelli Brown is a 85 y.o. female patient who was admitted on 12/31/2018 for acute hypoxic respiratory failure with concerning signs of pneumonia and acute on chronic CHF. Since admission, her symptoms have improved and she is now only on 1L/min of NC.     Assessment and Plan:      Acute hypoxic respiratory failure (Resolved)  -Was likely due to CHF, pHTN, ILD, pneumonia/pneumonitis, possible amiodarone toxicity.  -Pt on 1 L/min NC today, continue to wean as tolerated.  -Will change Prednisone to 60 mg PO every morning w/ breakfast starting on Sunday 01/15/19.  -Elevated WBCs today likely due to steroids.  -Continue Lasix 40 mg PO BID.  -Continue Incentive Spirometry.  -PT/OT working w/ patient.    Pneumonia/Pneumonitis (Resolved)  -CXR/CT from earlier in hospitalization was consistent with multifocal pneumonia/pneumonitis.  -Serial repeat imaging showed improvement of aeration.  -S/p 7 day course of Zosyn  -Blood Cx, MRSA nares, and Influenza A&B were NEGATIVE.    Oral candidiasis  -Stopped nystatin suspension.  -Switched to Magic Mouthwash 15 ml oral 4 times daily.     Acute on Chronic dCHF w/ preserved EF 60-65% w/ grade 2 diastolic dysfunction.  -Echo from 01/02/19 showed mild concentric LVH, LVEF 60-65%, grade 2 diastolic dysfunction, moderate AS, mild AR, moderate TR with moderate to severe pHTN.  -Continue Lasix 40 mg PO BID.  -Strict I/Os and daily weights.    CKD stage III  -Cr and GFR stable.    -Supra therapeutic INR (Resolved)  - INR today was 2.3.  -Continue Warfarin with pharmacy dosing.    Mild anemia due to CKD  -H&H stable    Paroxysmal atrial fibrillation  -Continue Toprol-XL 50 mg every morning.  -Amiodarone  discontinued.  -Continue warfarin with pharmacy dosing, INR was 2.3 today.    Hypertension  -Toprol-XL 50 mg every morning.  -Lasix 40 mg twice daily.    Hyperlipidemia  -Continue pravastatin 40 mg every morning.    Disposition: Inpatient  DVT PPX: Warfarin  Code:  NO CPR - SUPPORT OK     Subjective   Patient states that she feels well this morning and that she has made tremendous progress since being admitted.  Review of Systems   Constitutional: Negative for fever.   Respiratory: Negative for shortness of breath.    Cardiovascular: Negative for chest pain and leg swelling.   Gastrointestinal: Negative for abdominal pain, blood in stool, constipation, diarrhea, nausea and vomiting.   Genitourinary: Negative.    Skin: Negative for rash.   Neurological: Negative for dizziness and headaches.           Objective   Physical Exam:     Vitals: T:97.9 F (36.6 C) (Oral), BP:130/63, HR:80, RR:18, SaO2:95%    General: Patient is awake and cooperative. In no acute distress.  HEENT: No conjunctival drainage with anicteric sclera. Oral thrush present with MMM.  Neck: Supple, no  thyromegaly.  Chest: CTA bilaterally with decreased breath sounds. No rhonchi, no wheezing. No use of accessory muscles.  CVS: Normal rate and regular rhythm with systolic murmur present, without JVD, no pitting edema, pulses palpable.  Abdomen: Soft, non-tender, no guarding or rigidity, with normal bowel sounds.  Extremities: No calf swelling and no gross deformity.  Skin: Warm, dry, no rash and no worrisome lesions.  NEURO: No motor or sensory deficits.  Psychiatric: Alert, interactive, appropriate, normal affect.  Weight Monitoring 01/07/2019 01/08/2019 01/09/2019 01/10/2019 01/12/2019 01/13/2019 01/14/2019   Height - - - - - - -   Height Method - - - - - - -   Weight 67.994 kg 66.7 kg 65.772 kg 65.5 kg 62.506 kg 62.143 kg 63.458 kg   Weight Method Standing Scale Actual Standing Scale Actual Standing Scale Standing Scale Standing Scale   BMI (calculated)  - - - - - - -         Intake/Output Summary (Last 24 hours) at 01/14/2019 1433  Last data filed at 01/14/2019 1412  Gross per 24 hour   Intake 730 ml   Output 1150 ml   Net -420 ml     Body mass index is 27.32 kg/m.     Meds:     Current Facility-Administered Medications   Medication Dose Route Frequency   . aluminum & magnesium / diphenhydramine / lidocaine / nystatin  15 mL Oral QID   . aspirin EC  81 mg Oral QAM   . dorzolamide  1 drop Left Eye TID   . famotidine  10 mg Oral Daily   . folic acid  1 mg Oral Daily   . furosemide  40 mg Oral BID   . guaiFENesin  600 mg Oral Q12H SCH   . loratadine  10 mg Oral Daily   . metoprolol succinate XL  50 mg Oral QAM   . multivitamin  1 tablet Oral Daily   . pravastatin  40 mg Oral QAM   . [START ON 01/15/2019] predniSONE  60 mg Oral QAM W/BREAKFAST   . sodium chloride (PF)  3 mL Intravenous Q8H   . warfarin  2.5 mg Oral Daily at 1800   . warfarin therapy placeholder  1 each Does not apply See Admin Instructions       PRN Meds: acetaminophen **OR** acetaminophen **OR** acetaminophen, albuterol, LORazepam, naloxone, ondansetron **OR** ondansetron, prochlorperazine.     LABS:     Estimated Creatinine Clearance: 21.8 mL/min (A) (based on SCr of 1.23 mg/dL (H)).  Recent Labs   Lab 01/14/19  0905 01/13/19  0522   WBC 19.4* 15.9*   RBC 3.55* 3.50*   Hemoglobin 11.4* 11.3*   Hematocrit 35.2* 34.7*   MCV 99 99   PLT CT 268 243     Recent Labs   Lab 01/14/19  0438 01/13/19  0522 01/12/19  0431   PT 23.0* 22.5* 21.9*   PT INR 2.3* 2.2* 2.2*         No results found for: HGBA1CPERCNT  Recent Labs   Lab 01/14/19  0905 01/13/19  0522 01/12/19  0855   Glucose 151* 154* 159*   Sodium 138 137 138   Potassium 3.8 4.0 5.5*   Chloride 94* 94* 94*   CO2 37* 32* 36*   BUN 51* 56* 49*   Creatinine 1.23* 1.20 1.21*   EGFR 37* 38* 38*   Calcium 8.6 8.5 9.1     Recent Labs   Lab 01/12/19  9528   Albumin 3.2*   Protein, Total 6.1   Bilirubin, Total 1.1   Alkaline Phosphatase 66   ALT 19   AST (SGOT) 18            Invalid input(s):  AMORPHOUSUA   Patient Lines/Drains/Airways Status    Active PICC Line / CVC Line / PIV Line / Drain / Airway / Intraosseous Line / Epidural Line / ART Line / Line / Wound / Pressure Ulcer / NG/OG Tube     Name:   Placement date:   Placement time:   Site:   Days:    Peripheral IV 01/01/19 Right Forearm   01/01/19    0030    Forearm   13               Xr Chest Ap Portable    Result Date: 01/11/2019  Persistent bilateral airspace disease or edema. Stable heart size. Atherosclerotic changes of the thoracic aorta. Osteoarthritic changes of thoracic spine. Minimal change from 01/09/2019. ReadingStation:[email or web address]    Xr Chest Ap Portable    Result Date: 01/09/2019  1. Bilateral infiltrates are unchanged. 2. Small right pleural effusion. ReadingStation:[email or web address]    Xr Chest Ap Portable    Result Date: 01/08/2019  1.  Persistent widespread airspace opacity, with improved aeration since February 14. 2.  Somewhat nodular density at the right base, not well-seen on the previous exam. Follow-up to radiographic resolution suggested. 3.  Pleural effusions, decreased in size. ReadingStation:[email or web address]     Home Health Needs:  There are no questions and answers to display.       Nutrition assessment done in collaboration with Registered Dietitians:     Time spent: 35 minutes.     Villa Herb, DO     01/14/19,2:33 PM   MRN: [id number]                                      CSN: [id number] DOB: 08-03-1919

## 2019-01-15 LAB — PT/INR
PT INR: 2.4 — ABNORMAL HIGH (ref 0.5–1.3)
PT: 24 s — ABNORMAL HIGH (ref 9.5–11.5)

## 2019-01-15 MED ORDER — WARFARIN SODIUM 1 MG PO TABS
1.00 mg | ORAL_TABLET | Freq: Every day | ORAL | Status: AC
Start: 2019-01-15 — End: 2019-01-15
  Administered 2019-01-15: 18:00:00 1 mg via ORAL
  Filled 2019-01-15: qty 1

## 2019-01-15 NOTE — Progress Note - Problem Oriented Charting Notewrit (Signed)
Pharmacy Consult Warfarin Dosing  Kelli Brown    Age: 85 y.o.  Weight: 62.9 kg (138 lb 11.2 oz)  Indication: a fib  Goal INR: 2-3  Home warfarin dose =   2.5 mg daily except 5mg  Thurs and Sat  Interacting agent/disease: amiodarone, aspirin, methylprednisolone, Zosyn    Subjective/Objective:   [name] admitted from assisted living with hypoxia and lethargy. Takes warfarin as outpatient reportedly at above dosage, although it is not on patient's home med list. Per facility, home med list is not regularly updated. Received Vitamin K 5 mg po on 2/9 for elevated INR. Multivitamin started 2/14.     Assessment/Plan   INR 2.4 today   Warfarin 1 mg daily   Multivitamin continues   Daily INR   Monitor for signs and symptoms of bleeding   Avoid IM injections    Current warfarin dose =(hold for INR greater than 3.5)  Date 2/8 2/9 2/10 2/11 2/12 2/13 2/14 2-15 2-16 2/17 2/18 2/19 2/20 2/21   INR 8.8 9.7 1.5 1.4 2.1 3.6 4.5 3.8 3.3 2.5 2 2.1 2.2 2.2   dose Hold hold 5 mg 5 mg Hold Hold Hold hold hold 2.5mg  2.5mg  2.5mg  2.5mg  2.5 mg   Hgb 11.1 10.3 9.8 10.3 9.9   10.4  10.3       Plt 259 245 247 286 271   288  289         Date 2/22 2/23               INR 2.3 2.4               dose 2.5 1               Hgb                 Plt                     Past Medical History:   Diagnosis Date    Abnormal vision     Arthritis     Atrial fibrillation     Congestive heart failure     Glaucoma     Hip fx, right, closed, initial encounter 2010    Hyperlipidemia     Hypertension     Low back pain     Macular degeneration     Nonrheumatic aortic (valve) stenosis 08/11/2017    Shingles         Recent Labs   Lab 01/12/19  0855   Bilirubin, Total 1.1   Protein, Total 6.1   Albumin 3.2*   ALT 19   AST (SGOT) 18       If you have any questions, please contact the pharmacist at [phone].    [name], PharmD

## 2019-01-15 NOTE — Plan of Care (Signed)
Problem: Moderate/High Fall Risk Score >5  Goal: Patient will remain free of falls  Outcome: Progressing  Flowsheets (Taken 01/13/2019 2344 by Deretha Emory, RN)  High (Greater than 13): LOW-Fall Interventions Appropriate for Low Fall Risk;LOW-Anticoagulation education for injury risk;MOD-Use of chair-pad alarm when appropriate;MOD-Consider a move closer to Nurses Station;MOD-Remain with patient during toileting;MOD-Place Fall Risk level on whiteboard in room;HIGH-Utilize chair pad alarm for patient while in the chair;HIGH-Apply yellow "Fall Risk" arm band     Problem: Compromised Hemodynamic Status  Goal: Vital signs and fluid balance maintained/improved  Outcome: Progressing  Flowsheets (Taken 01/11/2019 1138 by Senaida Lange, RN)  Vital signs and fluid balance are maintained/improved: Position patient for maximum circulation/cardiac output;Monitor/assess vitals and hemodynamic parameters with position changes;Monitor/assess lab values and report abnormal values;Monitor intake and output. Notify LIP if urine output is less than 30 mL/hour.     Problem: Inadequate Gas Exchange  Goal: Adequate oxygenation and improved ventilation  Outcome: Progressing  Flowsheets (Taken 01/11/2019 1138 by Senaida Lange, RN)  Adequate oxygenation and improved ventilation: Assess lung sounds;Monitor SpO2 and treat as needed;Consult/collaborate with Respiratory Therapy;Increase activity as tolerated/progressive mobility;Position for maximum ventilatory efficiency;Plan activities to conserve energy: plan rest periods     Problem: Inadequate Airway Clearance  Goal: Normal respiratory rate/effort achieved/maintained  Outcome: Progressing  Flowsheets (Taken 01/11/2019 1138 by Senaida Lange, RN)  Normal respiratory rate/effort achieved/maintained: Plan activities to conserve energy: plan rest periods

## 2019-01-15 NOTE — Plan of Care (Signed)
NURSE NOTE SUMMARY  [facility] - GI/ENDO/GEN MED   Patient Name: Kelli Brown   Attending Physician: Esmond Harps, DO   Today's date:   01/15/2019 LOS: 15 days   Shift Summary:                                                              Assumed pt care at 1900. Pt AAOx3-4,  pt denies pain and shows no s/s of distress. Pt resting comfortably in bed at this time, Call bell within reach, bed alarm on and audible, will continue to monitor.      Provider Notifications:      Rapid Response Notifications:  Mobility:      PMP Activity: Step 6 - Walks in Room (01/15/2019  8:01 PM)     Weight tracking:  Family Dynamic:   Last 3 Weights for the past 72 hrs (Last 3 readings):   Weight   01/15/19 0400 62.9 kg (138 lb 11.2 oz)   01/14/19 0443 63.5 kg (139 lb 14.4 oz)   01/13/19 0343 62.1 kg (137 lb)             Recent Vitals Last Bowel Movement   BP: 127/64 (01/15/2019  3:51 PM)  Heart Rate: 81 (01/15/2019  3:51 PM)  Temp: 98.2 F (36.8 C) (01/15/2019  3:51 PM)  Resp Rate: 20 (01/15/2019  3:51 PM)  Weight: 62.9 kg (138 lb 11.2 oz) (01/15/2019  4:00 AM)  SpO2: 96 % (01/15/2019  3:51 PM)   Last BM Date: 01/13/19         Problem: Moderate/High Fall Risk Score >5  Goal: Patient will remain free of falls  Outcome: Progressing  Flowsheets (Taken 01/15/2019 2001)  VH High Risk (Greater than 13): ALL REQUIRED LOW INTERVENTIONS;ALL REQUIRED MODERATE INTERVENTIONS;RED "HIGH FALL RISK" SIGNAGE;BED ALARM WILL BE ACTIVATED WHEN THE PATEINT IS IN BED WITH SIGNAGE "RESET BED ALARM";A CHAIR PAD ALARM WILL BE USED WHEN PATIENT IS UP SITTING IN A CHAIR;PATIENT IS TO BE SUPERVISED FOR ALL TOILETING ACTIVITIES;Keep door open for better visibility;Include family/significant other in multidisciplinary discussion regarding plan of care as appropriate;Use assistive devices;Use chair-pad alarm device     Problem: Compromised Tissue integrity  Goal: Damaged tissue is healing and protected  Outcome: Progressing  Flowsheets (Taken  01/15/2019 2318)  Damaged tissue is healing and protected : Monitor/assess Braden scale every shift; Reposition patient every 2 hours and as needed unless able to reposition self; Increase activity as tolerated/progressive mobility; Relieve pressure to bony prominences for patients at moderate and high risk; Avoid shearing injuries; Keep intact skin clean and dry; Use incontinence wipes for cleaning urine, stool and caustic drainage. Foley care as needed; Monitor external devices/tubes for correct placement to prevent pressure, friction and shearing; Encourage use of lotion/moisturizer on skin; Monitor patient's hygiene practices  Goal: Nutritional status is improving  Outcome: Progressing  Flowsheets (Taken 01/15/2019 2318)  Nutritional status is improving: Allow adequate time for meals; Encourage patient to take dietary supplement(s) as ordered; Include patient/patient care companion in decisions related to nutrition     Problem: Compromised Hemodynamic Status  Goal: Vital signs and fluid balance maintained/improved  Outcome: Progressing  Flowsheets (Taken 01/15/2019 2318)  Vital signs and fluid balance are maintained/improved: Position patient for maximum circulation/cardiac output; Monitor/assess vitals  and hemodynamic parameters with position changes; Monitor intake and output. Notify LIP if urine output is less than 30 mL/hour.; Monitor/assess lab values and report abnormal values     Problem: Inadequate Gas Exchange  Goal: Adequate oxygenation and improved ventilation  Outcome: Progressing  Flowsheets (Taken 01/15/2019 2318)  Adequate oxygenation and improved ventilation: Assess lung sounds; Monitor SpO2 and treat as needed; Provide mechanical and oxygen support to facilitate gas exchange; Position for maximum ventilatory efficiency; Teach/reinforce use of incentive spirometer 10 times per hour while awake, cough and deep breath as needed; Plan activities to conserve energy: plan rest periods; Increase activity  as tolerated/progressive mobility; Consult/collaborate with Respiratory Therapy     Problem: Inadequate Airway Clearance  Goal: Normal respiratory rate/effort achieved/maintained  Outcome: Progressing  Flowsheets (Taken 01/15/2019 2318)  Normal respiratory rate/effort achieved/maintained: Plan activities to conserve energy: plan rest periods

## 2019-01-15 NOTE — Progress Notes (Signed)
Medicine Progress Note   [facility], [place]   Patient Name: Kelli Brown LOS: 15 days   Attending Physician: Esmond Harps, DO PCP: Flossie Dibble, MD      Hospital Course:                                                            Kelli Brown is a 85 y.o. patient admitted on 12/31/2018.       Assessment and Plan:    Acute hypoxic respiratory failure (Resolved)  Improved. Still on low dose O2 by nc.      Pneumonia/Pneumonitis   Treated.    Oral candidiasis  On Magic Mouthwash 15 ml oral 4 times daily.    Acute on Chronic dCHF w/ preserved EF 60-65% w/ grade 2 diastolic dysfunction.  Improved. Now on oral Lasix.    CKD stage III  Stable.    Mild anemia due to CKD  H&H stable    Paroxysmal atrial fibrillation  INR is therapeutic.    Hypertension  Blood pressures reviewed.  No med change.     Hyperlipidemia  On pravastatin.    Disposition: Inpatient  DVT PPX: Warfarin  Code:            NO CPR - SUPPORT OK     Subjective   Feels better today.  Denies shortness of breath.  No specific complaints at this time.      Objective   Physical Exam:     Vitals: T:98.2 F (36.8 C) (Oral), BP:127/64, HR:81, RR:20, SaO2:96%    General: Patient is awake. In no acute distress.   HEENT: No conjunctival drainage, vision is intact, anicteric sclera.  Neck: Supple, no thyromegaly.  Chest: CTA bilaterally. No rhonchi, no wheezing. No use of accessory muscles.  CVS: Normal rate and regular rhythm. No LE edema.  Abdomen: Soft, non-tender, no guarding or rigidity, with normal bowel sounds.  Extremities: No calf swelling and no gross deformity.  Skin: Warm, dry, no rash and no worrisome lesions.  NEURO: No motor or sensory deficits.  Psychiatric: Alert, interactive, appropriate, normal affect.  Weight Monitoring 01/08/2019 01/09/2019 01/10/2019 01/12/2019 01/13/2019 01/14/2019 01/15/2019   Height - - - - - - -   Height Method - - - - - - -   Weight 66.7 kg 65.772 kg 65.5 kg 62.506 kg 62.143 kg  63.458 kg 62.914 kg   Weight Method Actual Standing Scale Actual Standing Scale Standing Scale Standing Scale Standing Scale   BMI (calculated) - - - - - - -         Intake/Output Summary (Last 24 hours) at 01/15/2019 1603  Last data filed at 01/15/2019 1551  Gross per 24 hour   Intake 570 ml   Output 900 ml   Net -330 ml     Body mass index is 27.09 kg/m.     Meds:     Current Facility-Administered Medications   Medication Dose Route Frequency    aluminum & magnesium / diphenhydramine / lidocaine / nystatin  15 mL Oral QID    aspirin EC  81 mg Oral QAM    dorzolamide  1 drop Left Eye TID    famotidine  10 mg Oral Daily    folic acid  1 mg  Oral Daily    furosemide  40 mg Oral BID    guaiFENesin  600 mg Oral Q12H SCH    loratadine  10 mg Oral Daily    metoprolol succinate XL  50 mg Oral QAM    multivitamin  1 tablet Oral Daily    pravastatin  40 mg Oral QAM    predniSONE  60 mg Oral QAM W/BREAKFAST    sodium chloride (PF)  3 mL Intravenous Q8H    warfarin  1 mg Oral Daily at 1800    warfarin therapy placeholder  1 each Does not apply See Admin Instructions       PRN Meds: acetaminophen **OR** acetaminophen **OR** acetaminophen, albuterol, LORazepam, naloxone, ondansetron **OR** ondansetron, prochlorperazine.     LABS:     Estimated Creatinine Clearance: 21.7 mL/min (A) (based on SCr of 1.23 mg/dL (H)).  Recent Labs   Lab 01/14/19  0905 01/13/19  0522   WBC 19.4* 15.9*   RBC 3.55* 3.50*   Hemoglobin 11.4* 11.3*   Hematocrit 35.2* 34.7*   MCV 99 99   PLT CT 268 243     Recent Labs   Lab 01/15/19  0356 01/14/19  0438 01/13/19  0522   PT 24.0* 23.0* 22.5*   PT INR 2.4* 2.3* 2.2*         No results found for: HGBA1CPERCNT  Recent Labs   Lab 01/14/19  0905 01/13/19  0522 01/12/19  0855   Glucose 151* 154* 159*   Sodium 138 137 138   Potassium 3.8 4.0 5.5*   Chloride 94* 94* 94*   CO2 37* 32* 36*   BUN 51* 56* 49*   Creatinine 1.23* 1.20 1.21*   EGFR 37* 38* 38*   Calcium 8.6 8.5 9.1     Recent Labs   Lab  01/12/19  0855   Albumin 3.2*   Protein, Total 6.1   Bilirubin, Total 1.1   Alkaline Phosphatase 66   ALT 19   AST (SGOT) 18           Invalid input(s):  AMORPHOUSUA   Patient Lines/Drains/Airways Status    Active PICC Line / CVC Line / PIV Line / Drain / Airway / Intraosseous Line / Epidural Line / ART Line / Line / Wound / Pressure Ulcer / NG/OG Tube     Name:   Placement date:   Placement time:   Site:   Days:    Peripheral IV 01/01/19 Right Forearm   01/01/19    0030    Forearm   14               Xr Chest Ap Portable    Result Date: 01/11/2019  Persistent bilateral airspace disease or edema. Stable heart size. Atherosclerotic changes of the thoracic aorta. Osteoarthritic changes of thoracic spine. Minimal change from 01/09/2019. ReadingStation:[email or web address]    Xr Chest Ap Portable    Result Date: 01/09/2019  1. Bilateral infiltrates are unchanged. 2. Small right pleural effusion. ReadingStation:[id number]     Home Health Needs:  There are no questions and answers to display.             Esmond Harps, DO     01/15/19,4:03 PM   MRN: [id number]                                      CSN: [id number] DOB: 1919-08-11

## 2019-01-16 LAB — ECG 12-LEAD
P Wave Axis: -8 deg
P-R Interval: 146 ms
Patient Age: 89 years
Q-T Interval(Corrected): 447 ms
Q-T Interval: 385 ms
QRS Axis: 31 deg
QRS Duration: 89 ms
T Axis: 67 years
Ventricular Rate: 81 //min

## 2019-01-16 LAB — CBC AND DIFFERENTIAL
Bands: 2 % (ref 0–10)
Basophils %: 0 % (ref 0.0–3.0)
Basophils Absolute: 0 10*3/uL (ref 0.0–0.3)
Eosinophils %: 0 % (ref 0.0–7.0)
Eosinophils Absolute: 0 10*3/uL (ref 0.0–0.8)
Hematocrit: 31.6 % — ABNORMAL LOW (ref 36.0–48.0)
Hemoglobin: 10.3 gm/dL — ABNORMAL LOW (ref 12.0–16.0)
Lymphocytes Absolute: 2.3 10*3/uL (ref 0.6–5.1)
Lymphocytes: 13 % — ABNORMAL LOW (ref 15.0–46.0)
MCH: 32 pg (ref 28–35)
MCHC: 33 gm/dL (ref 32–36)
MCV: 99 fL (ref 80–100)
MPV: 7.7 fL (ref 6.0–10.0)
Monocytes Absolute: 0.8 10*3/uL (ref 0.1–1.7)
Monocytes: 7 % (ref 3.0–15.0)
Neutrophils %: 78 % (ref 42.0–78.0)
Neutrophils Absolute: 8.8 10*3/uL — ABNORMAL HIGH (ref 1.7–8.6)
PLT CT: 196 10*3/uL (ref 130–440)
RBC: 3.19 10*6/uL — ABNORMAL LOW (ref 3.80–5.00)
RDW: 17.2 % — ABNORMAL HIGH (ref 11.0–14.0)
WBC: 11.9 10*3/uL — ABNORMAL HIGH (ref 4.0–11.0)

## 2019-01-16 LAB — TROPONIN I
Troponin I: 0.02 ng/mL (ref 0.00–0.02)
Troponin I: 0.02 ng/mL (ref 0.00–0.02)
Troponin I: 0.01 ng/mL (ref 0.00–0.02)

## 2019-01-16 LAB — BASIC METABOLIC PANEL
Anion Gap: 13.6 mMol/L (ref 7.0–18.0)
BUN / Creatinine Ratio: 42.6 Ratio — ABNORMAL HIGH (ref 10.0–30.0)
BUN: 43 mg/dL — ABNORMAL HIGH (ref 7–22)
CO2: 32 mMol/L — ABNORMAL HIGH (ref 20–30)
Calcium: 8 mg/dL — ABNORMAL LOW (ref 8.5–10.5)
Chloride: 97 mMol/L — ABNORMAL LOW (ref 98–110)
Creatinine: 1.01 mg/dL (ref 0.60–1.20)
EGFR: 47 mL/min/{1.73_m2} — ABNORMAL LOW (ref 60–150)
Glucose: 106 mg/dL — ABNORMAL HIGH (ref 71–99)
Osmolality Calculated: 289 mOsm/kg (ref 275–300)
Potassium: 3.6 mMol/L (ref 3.5–5.3)
Sodium: 139 mMol/L (ref 136–147)

## 2019-01-16 LAB — MAGNESIUM: Magnesium: 2.5 mg/dL (ref 1.6–2.6)

## 2019-01-16 LAB — PHOSPHORUS: Phosphorus: 2.5 mg/dL (ref 2.3–4.7)

## 2019-01-16 LAB — PT/INR
PT INR: 2.6 — ABNORMAL HIGH (ref 0.5–1.3)
PT: 26 s — ABNORMAL HIGH (ref 9.5–11.5)

## 2019-01-16 LAB — VH DEXTROSE STICK GLUCOSE: Glucose POCT: 148 mg/dL — ABNORMAL HIGH (ref 71–99)

## 2019-01-16 MED ORDER — SODIUM CHLORIDE 0.9 % IV SOLN
INTRAVENOUS | Status: AC
Start: 2019-01-16 — End: 2019-01-16

## 2019-01-16 MED ORDER — METOPROLOL TARTRATE 25 MG PO TABS
12.5000 mg | ORAL_TABLET | Freq: Two times a day (BID) | ORAL | Status: DC
Start: 2019-01-17 — End: 2019-01-17
  Administered 2019-01-17: 09:00:00 12.5 mg via ORAL
  Filled 2019-01-16 (×2): qty 1

## 2019-01-16 MED ORDER — PREDNISONE 20 MG PO TABS
55.0000 mg | ORAL_TABLET | Freq: Every morning | ORAL | Status: DC
Start: 2019-01-17 — End: 2019-01-18
  Administered 2019-01-17 – 2019-01-18 (×2): 55 mg via ORAL
  Filled 2019-01-16 (×2): qty 2

## 2019-01-16 MED ORDER — WARFARIN SODIUM 1 MG PO TABS
0.50 mg | ORAL_TABLET | Freq: Every day | ORAL | Status: AC
Start: 2019-01-16 — End: 2019-01-16
  Administered 2019-01-16: 17:00:00 0.5 mg via ORAL
  Filled 2019-01-16: qty 0.5

## 2019-01-16 NOTE — PT Plan of Care Note (Signed)
[facility]  [facility]  Department of Rehabilitation Services  [phone]  Khaniyah Bezek Dyke CSN: [id number]  GI/ENDO/GEN MED 528/528-A    Physical Therapy General Note  Time: 1528    Last seen by a Physical Therapist vs. PTA: 01/13/19    Attempted to see patient for PT treatment, but patient very lethargic after unresponsive episode and emesis earlier in the day. Patient resting at this time and RN preparing to obtain orthostatic BP's. Will hold for today and follow-up tomorrow.     Team Communication: RN: Rosey Bath, OTLurena Joiner    Plan: Will follow up tomorrow per POC.     Berneice Heinrich, PT, DPT

## 2019-01-16 NOTE — OT Plan of Care Note (Signed)
[facility]  [facility]  [address]  [place] [address]  Department of Rehabilitation Services  [phone]    Latonga Ponder Gomillion CSN: [id number]  GI/ENDO/GEN MED 528/528-A    Occupational Therapy General Note  Time: 1002-1012    Patient supervision supine to sit, setup to don slippers sitting edge of bed. Patient stating she was tired throughout treatment. Patient supervision to walk to door and request to sit. RN- Rosey Bath provided chair. Once in sitting, patient started to twitch and became unresponsive. Therapist moved chair and patient back to room to transfer back to bed. Patient slumping to right and proceeding with emesis. RN- Rosey Bath called for assistance and patient dependently transferred back to bed.     BP in Supine- 131/67  HR supine- 96  SPO2 in supine- 100% room air    Team communication: RNRosey Bath    Plan: Will follow up per POC.     Loma Messing MOTR/L

## 2019-01-16 NOTE — UM Notes (Signed)
[facility] Utilization Management Review Sheet    Facility :[facility]    NAME:Kelli White InskipMR#: [id number]  DOB:11-12-1919  CSN#:[id number]  ROOM:528/528-AAGE: 85 y.o.    ADMIT DATE AND TIME:12/31/2018 10:50 AM  PATIENT CLASS:INPATIENT02/08/20 1429    DATE OF REVIEW: 01/14/2019    MEDICINE:   Assessment and Plan:      Acute hypoxic respiratory failure (Resolved)  -Was likely due to CHF, pHTN, ILD, pneumonia/pneumonitis, possible amiodarone toxicity.  -Pt on 1 L/min NC today, continue to wean as tolerated.  -Will change Prednisone to 60 mg PO every morning w/ breakfast starting on Sunday 01/15/19.  -Elevated WBCs today likely due to steroids.  -Continue Lasix 40 mg PO BID.  -Continue Incentive Spirometry.  -PT/OT working w/ patient.    Pneumonia/Pneumonitis (Resolved)  -CXR/CT from earlier in hospitalization was consistent with multifocal pneumonia/pneumonitis.  -Serial repeat imaging showed improvement of aeration.  -S/p 7 day course of Zosyn  -Blood Cx, MRSA nares, and Influenza A&B were NEGATIVE.    Oral candidiasis  -Stopped nystatin suspension.  -Switched to Magic Mouthwash 15 ml oral 4 times daily.    Acute on Chronic dCHF w/ preserved EF 60-65% w/ grade 2 diastolic dysfunction.  -Echo from 01/02/19 showed mild concentric LVH, LVEF 60-65%, grade 2 diastolic dysfunction, moderate AS, mild AR, moderate TR with moderate to severe pHTN.  -Continue Lasix 40 mg PO BID.  -Strict I/Os and daily weights.    CKD stage III  -Cr and GFR stable.    -Supra therapeutic INR (Resolved)  - INR today was 2.3.  -Continue Warfarin with pharmacy dosing.    Mild anemia due to CKD  -H&H stable    Paroxysmal atrial fibrillation  -Continue Toprol-XL 50 mg every morning.  -Amiodarone discontinued.  -Continue warfarin with pharmacy dosing, INR was 2.3 today.    Hypertension  -Toprol-XL 50 mg every morning.  -Lasix 40 mg twice daily.    Hyperlipidemia  -Continue pravastatin 40 mg every  morning.    Disposition: Inpatient     Vitals: T:97.9 F (36.6 C) (Oral), BP:130/63, HR:80, RR:18, SaO2:95%    Recent Labs   Lab 01/14/19  0905 01/13/19  0522   WBC 19.4* 15.9*   RBC 3.55* 3.50*   Hemoglobin 11.4* 11.3*   Hematocrit 35.2* 34.7*   MCV 99 99   PLT CT 268 243           Recent Labs   Lab 01/14/19  0438 01/13/19  0522 01/12/19  0431   PT 23.0* 22.5* 21.9*   PT INR 2.3* 2.2* 2.2*     Recent Labs   Lab 01/14/19  0905 01/13/19  0522 01/12/19  0855   Glucose 151* 154* 159*   Sodium 138 137 138   Potassium 3.8 4.0 5.5*   Chloride 94* 94* 94*   CO2 37* 32* 36*   BUN 51* 56* 49*   Creatinine 1.23* 1.20 1.21*   EGFR 37* 38* 38*   Calcium 8.6 8.5 9.1     Scheduled Meds:  Current Facility-Administered Medications   Medication Dose Route Frequency    aluminum & magnesium / diphenhydramine / lidocaine / nystatin  15 mL Oral QID    aspirin EC  81 mg Oral QAM    dorzolamide  1 drop Left Eye TID    famotidine  10 mg Oral Daily    folic acid  1 mg Oral Daily    guaiFENesin  600 mg Oral Q12H SCH    loratadine  10 mg Oral Daily    [START ON 01/17/2019] metoprolol tartrate  12.5 mg Oral Q12H SCH    multivitamin  1 tablet Oral Daily    pravastatin  40 mg Oral QAM    [START ON 01/17/2019] predniSONE  55 mg Oral QAM W/BREAKFAST    sodium chloride (PF)  3 mL Intravenous Q8H    warfarin therapy placeholder  1 each Does not apply See Admin Instructions   PREDNISONE 20MG  PO X1;     PULMONARY:   Assessment/Plan:   Active Problems:    Pneumonia    Acute exacerbation of CHF (congestive heart failure)    Acute respiratory failure with hypoxia    Hypertension    Chronic kidney disease, stage III (moderate)    Hyperlipidemia    Hypoxia    1.  Hypoxia-significantly improved.  Now down to 1 L nasal cannula.  Continue to wean as tolerates.  2.  Pulmonary hypertension-continue diuresis.  BUN and creatinine stable.  3.  Interstitial lung disease-decrease prednisone from 80 mg to 60 mg a day.  Decrease/wean over 2-week  timeframe.  4.  [place] nystatin.  Start Magic mouthwash which has nystatin in it    Subjective:   Complains that her tongue hurts.  Anxious to get out of the hospital.  Thinks she is going to rehab on Monday  The following portions of the patient's history were reviewed and updated as appropriate: allergies and current medications.    Trevor Iha, RN, BSN  Utilization Management  [facility]  [address]  [place], [place] [address]  Work: [phone]  Fax: [phone]  [email or web address]

## 2019-01-16 NOTE — Significant Event (Signed)
Pt was up walking with OT when she felt weak. She stated "I dont feel good". She was seated in the chair and vomited. Seemed to lose consciousness with mild shaking. Code Blue was called. Rapid response team arrived. Patient was A&Ox3. No evidence of stroke. Will order mg, phos, troponin, EKG. Labs from this AM wnl.

## 2019-01-16 NOTE — Plan of Care (Addendum)
NURSE NOTE SUMMARY  [facility] - GI/ENDO/GEN MED   Patient Name: Kelli Brown   Attending Physician: Esmond Harps, DO   Today's date:   01/16/2019 LOS: 16 days   Shift Summary:                                                              Assumed pt care at 0700. Pt in chair bathing.  No c/o pain. VSS. BP 160/91.     1000 Pt worked with PT and walked to hallway.  There she sat in chair and became unresponsive.  Pt and RN transferred her back to bed and a Code was called. Pt is a No Code with SUP. Pt had pulse and respirations so no compressions or interventions were given. O2 mask doned at 6L.  Pt became more responsive after approx 10 minutes.  Pt able to speak audibly by weak. Able to smile and raise arms.  MD ordered additional labs and orthostatic BP's. Pt weak and lethargic and resting in bed.  No c/o chest pain or HA. Pt A&Ox4    1300. MD updated of normal labs. Pt lethargic.  No new orders received.  Pt will stay in hospital additional day to monitor.      1550 Orthostatic BP's done - 101/50, 82/48, 79/40. MD notified.  Lasix held.    Provider Notifications:      Rapid Response Notifications:  Mobility:      PMP Activity: Step 6 - Walks in Room (01/16/2019  9:00 AM)     Weight tracking:  Family Dynamic:   Last 3 Weights for the past 72 hrs (Last 3 readings):   Weight   01/16/19 0705 63.2 kg (139 lb 4.8 oz)   01/15/19 0400 62.9 kg (138 lb 11.2 oz)   01/14/19 0443 63.5 kg (139 lb 14.4 oz)             Recent Vitals Last Bowel Movement   BP: 148/78 (01/16/2019 10:19 AM)  Heart Rate: 90 (01/16/2019 10:19 AM)  Temp: 97 F (36.1 C) (01/16/2019  7:51 AM)  Resp Rate: 19 (01/16/2019  7:51 AM)  Weight: 63.2 kg (139 lb 4.8 oz) (01/16/2019  7:05 AM)  SpO2: 97 % (01/16/2019  1:00 PM)   Last BM Date: 01/16/19       Problem: Moderate/High Fall Risk Score >5  Goal: Patient will remain free of falls  Flowsheets (Taken 01/13/2019 2344 by Deretha Emory, RN)  High (Greater than 13): LOW-Fall Interventions  Appropriate for Low Fall Risk;LOW-Anticoagulation education for injury risk;MOD-Use of chair-pad alarm when appropriate;MOD-Consider a move closer to Nurses Station;MOD-Remain with patient during toileting;MOD-Place Fall Risk level on whiteboard in room;HIGH-Utilize chair pad alarm for patient while in the chair;HIGH-Apply yellow "Fall Risk" arm band     Problem: Compromised Tissue integrity  Goal: Nutritional status is improving  Flowsheets (Taken 01/15/2019 2318 by Maurice Small, RN)  Nutritional status is improving: Allow adequate time for meals;Encourage patient to take dietary supplement(s) as ordered;Include patient/patient care companion in decisions related to nutrition     Problem: Compromised Hemodynamic Status  Goal: Vital signs and fluid balance maintained/improved  Flowsheets (Taken 01/15/2019 2318 by Maurice Small, RN)  Vital signs and fluid balance are maintained/improved: Position patient for maximum circulation/cardiac output;Monitor/assess  vitals and hemodynamic parameters with position changes;Monitor intake and output. Notify LIP if urine output is less than 30 mL/hour.;Monitor/assess lab values and report abnormal values     Problem: Inadequate Gas Exchange  Goal: Adequate oxygenation and improved ventilation  Flowsheets (Taken 01/15/2019 2318 by Maurice Small, RN)  Adequate oxygenation and improved ventilation: Assess lung sounds;Monitor SpO2 and treat as needed;Provide mechanical and oxygen support to facilitate gas exchange;Position for maximum ventilatory efficiency;Teach/reinforce use of incentive spirometer 10 times per hour while awake, cough and deep breath as needed;Plan activities to conserve energy: plan rest periods;Increase activity as tolerated/progressive mobility;Consult/collaborate with Respiratory Therapy     Problem: Inadequate Airway Clearance  Goal: Normal respiratory rate/effort achieved/maintained  Flowsheets (Taken 01/15/2019 2318 by Maurice Small, RN)  Normal  respiratory rate/effort achieved/maintained: Plan activities to conserve energy: plan rest periods

## 2019-01-16 NOTE — Progress Notes (Signed)
Medicine Progress Note - [facility]      Date Time: 01/16/19 11:03 AM  Patient Name: Kelli Brown  Attending Physician: Esmond Harps, DO    Assessment:                                                                                        Active Problems:    Pneumonia    Acute exacerbation of CHF (congestive heart failure)    Acute respiratory failure with hypoxia    Hypertension    Chronic kidney disease, stage III (moderate)    Hyperlipidemia    Hypoxia        Plan:                                                                                                      Acute hypoxic respiratory failure(Resolved)  Improved. Still on low dose O2 by nc.   Discontinue amiodarone on discharge  Will require prednisone taper     Pneumonia/Pneumonitis  Treated.    Oral candidiasis  OnMagic Mouthwash 15 ml oral 4 times daily.    Syncopal episode  Patient was walking with PT and synopsized - likely vasovagal  No seizure activity, no head trauma, no weakness or chest pain  Mg, phos, EKG and troponin rdered    Acute on Chronic dCHF w/ preserved EF 60-65% w/ grade 2 diastolic dysfunction.  Improved. Now on oral Lasix.    CKD stage III  Stable.    Mild anemia due to CKD  H&H stable    Paroxysmal atrial fibrillation  INR is therapeutic.    Hypertension  Blood pressures reviewed.  No med change.     Hyperlipidemia  On pravastatin.    Disposition:Inpatient  Lines:                                                                                                       Patient Lines/Drains/Airways Status    Active PICC Line / CVC Line / PIV Line / Drain / Airway / Intraosseous Line / Epidural Line / ART Line / Line / Wound / Pressure Ulcer / NG/OG Tube     Name:   Placement date:   Placement time:   Site:   Days:    Peripheral IV 01/01/19  Right Forearm   01/01/19    0030    Forearm   15                Disposition:                                                                                              Today's date: 01/16/2019  Length of Stay: 16  Code Status: NO CPR - SUPPORT OK  Anticipated medical stability for discharge in : 1 days    Subjective                                                                                             CC: <principal problem not specified>     Patient was doing well and off oxygen without SOB. Up out of bed with PT and syncopized. Will watch overnight.       Review of Systems:                                                                               Review of Systems   Constitutional: Negative for chills and fever.   HENT: Positive for mouth sores. Negative for congestion and sore throat.    Respiratory: Negative for cough and shortness of breath.    Cardiovascular: Negative for chest pain and leg swelling.   Gastrointestinal: Negative for abdominal pain, diarrhea, nausea and vomiting.   Genitourinary: Negative for dysuria, frequency and urgency.   Musculoskeletal: Negative for arthralgias and myalgias.   Skin: Negative for pallor and rash.   Neurological: Negative for dizziness and headaches.       Physical Exam:                                                                                     Temp:  [97 F (36.1 C)-98.2 F (36.8 C)] 97 F (36.1 C)  Heart Rate:  [79-94] 90  Resp Rate:  [16-20] 19  BP: (105-160)/(51-91) 148/78    Intake/Output Summary (Last 24 hours) at 01/16/2019 1103  Last data filed at 01/16/2019 0700  Gross per 24 hour   Intake  690 ml   Output 500 ml   Net 190 ml       General: Awake, alert, oriented x 3; No acute distress.  HEENT: EOMI, Sclera anicteric  Oropharynx clear without lesions, mucous membranes moist, tongue ulcer improving, minimal thrush  Glands: No cervical or axillary lymphadenopathy.  Neck: Supple, no lymphadenopathy, no thyromegaly  Cardiovascular: systolic ejection murmur  Lungs: CTAB, without wheezes, rhonchi, or rales  Abdomen: Soft, non-tender, non-distended; no palpable masses, normoactive bowel  sounds, no rebound or guarding  Extremities: No clubbing, cyanosis, or edema  Neuro: Cranial nerves grossly intact, strength 5/5 in upper and lower extremities, sensation intact  Psych: Normal affect, not depressed   Skin: No rashes or lesions noted    Meds:                                                                                                       Medications were reviewed in the electronic record: [x]       Estimated Creatinine Clearance: 26.4 mL/min (based on SCr of 1.01 mg/dL).  Current Facility-Administered Medications   Medication Dose Route Frequency   . aluminum & magnesium / diphenhydramine / lidocaine / nystatin  15 mL Oral QID   . aspirin EC  81 mg Oral QAM   . dorzolamide  1 drop Left Eye TID   . famotidine  10 mg Oral Daily   . folic acid  1 mg Oral Daily   . furosemide  40 mg Oral BID   . guaiFENesin  600 mg Oral Q12H SCH   . loratadine  10 mg Oral Daily   . metoprolol succinate XL  50 mg Oral QAM   . multivitamin  1 tablet Oral Daily   . pravastatin  40 mg Oral QAM   . predniSONE  60 mg Oral QAM W/BREAKFAST   . sodium chloride (PF)  3 mL Intravenous Q8H   . warfarin therapy placeholder  1 each Does not apply See Admin Instructions     PRN medications: acetaminophen **OR** acetaminophen **OR** acetaminophen, albuterol, LORazepam, naloxone, ondansetron **OR** ondansetron, prochlorperazine  IV Drips:       Labs and Imaging:                                                                                 Results     Procedure Component Value Units Date/Time    Dextrose Stick Glucose [[id number]]  (Abnormal) Collected:  01/16/19 1015    Specimen:  Blood Updated:  01/16/19 1031     Glucose, POCT 148 mg/dL     CBC and differential [[id number]]  (Abnormal) Collected:  01/16/19 0352    Specimen:  Blood Updated:  01/16/19 0533     WBC 11.9 K/cmm  RBC 3.19 M/cmm      Hemoglobin 10.3 gm/dL      Hematocrit 16.1 %      MCV 99 fL      MCH 32 pg      MCHC 33 gm/dL      RDW 09.6 %      PLT CT 196 K/cmm       MPV 7.7 fL      NEUTROPHIL % 78.0 %      Lymphocytes 13.0 %      Monocytes 7.0 %      Eosinophils % 0.0 %      Basophils % 0.0 %      Bands 2 %      Neutrophils Absolute 8.8 K/cmm      Lymphocytes Absolute 2.3 K/cmm      Monocytes Absolute 0.8 K/cmm      Eosinophils Absolute 0.0 K/cmm      BASO Absolute 0.0 K/cmm      RBC Morphology RBC Morphology Reviewed     Anisocytosis 1+     Hypochromia 1+     Elliptocytes 1+    Narrative:       Manual differential performed    Prothrombin time/INR [[id number]]  (Abnormal) Collected:  01/16/19 0352    Specimen:  Blood Updated:  01/16/19 0500     PT 26.0 sec      PT INR 2.6    Basic Metabolic Panel [[id number]]  (Abnormal) Collected:  01/16/19 0352    Specimen:  Plasma Updated:  01/16/19 0459     Sodium 139 mMol/L      Potassium 3.6 mMol/L      Chloride 97 mMol/L      CO2 32 mMol/L      Calcium 8.0 mg/dL      Glucose 213 mg/dL      Creatinine 0.86 mg/dL      BUN 43 mg/dL      Anion Gap 57.8 mMol/L      BUN/Creatinine Ratio 42.6 Ratio      EGFR 47 mL/min/1.16m2      Osmolality Calculated 289 mOsm/kg           Microbiology, reviewed and are significant for:  Microbiology Results     Procedure Component Value Units Date/Time    Blood Culture [[id number]] Collected:  01/02/19 1300    Specimen:  Blood from Venipuncture Updated:  01/07/19 1313    Narrative:       Specimen/Source: Blood/Venipuncture  Collected: 01/02/2019 13:00     Status: Final      Last Updated: 01/07/2019 13:10                Culture Result (Final)      No Growth in 5 Days          Blood Culture [[id number]] Collected:  01/02/19 1300    Specimen:  Blood from Venipuncture Updated:  01/07/19 1313    Narrative:       Specimen/Source: Blood/Venipuncture  Collected: 01/02/2019 13:00     Status: Final      Last Updated: 01/07/2019 13:10                Culture Result (Final)      No Growth in 5 Days          Influenza A / B Rapid Test [[id number]] Collected:  12/31/18 1108    Specimen:  Nasal Wash Updated:  12/31/18 1143      Influenza A Negative  Influenza B Negative     Comment: Method: Jarvis Morgan    The sensitivity for this method is between 90% and 95% for Influenza A and around 90% for Influenza B. The specificity for both Influenza A and B is around 96%. False positive results may occur, especially when the prevalence of Influenza activity is low. This is more likely with Influenza B due to its lower prevalence. Clinical conditions, including the prevalence of influenza activity, should be considered in the interpretation of results. If clinically indicated, results may be confirmed with PCR testing.  The above 2 analytes were performed by [facility] [phone])  [address] [address]         Narrative:       Influenza A antigen detection tests are unable to distinquish between novel and seasonal influenza A.    A negative result for either Influenza A or B antigen does not exclude influenza virus infection. Clinical correlation required.    All positive influenza antigen tests (A or B) require placement of patient on droplet precaution isolation.    Nares MRSA Detection by DNA Amplification [[id number]] Collected:  01/04/19 0224    Specimen:  Nares Updated:  01/04/19 0415     MRSA No MRSA detected.     Comment: The above 1 analytes were performed by [facility] [phone])  [address],[place] [address]               Imaging, reviewed and are significant for:  Radiology Results (24 Hour)     ** No results found for the last 24 hours. **              Signed:  Darral Dash, MD   SVRP R1, [phone]

## 2019-01-16 NOTE — Progress Notes (Addendum)
Quick Doc  [facility] - GI/ENDO/GEN MED   Patient Name: Kelli Brown   Attending Physician: Esmond Harps, DO   Today's date:   01/16/2019 LOS: 16 days   Expected Discharge Date      Quick  Assessment:                                                              ReAdmit Risk Score: 28    4:10 PM Discharge on hold for today, had syncopal episode, cardiac meds adjusted, IV fluids given.      CM Comments: 01/16/19 RNCM (PH) ADM acute hypoxic resp failure, PNA, dCHF. weaning O2 now at 2L/min Bed offer accepted by pt for [facility] today, Room 101A. VMT W/C van transport arranged for 1500.  Dr. Pollie Friar adn Sabra Heck RN notified.     Physical Discharge Disposition: Skilled Nursing Facility                          Receiving facility, unit and room number:: [facility]   Room 101A  Nursing report phone number:: [phone]  Facility fax number:: [phone]  Mode of Transportation: Wheelchair Zenaida Niece     Patient/Family/POA notified of transfer plan: Yes  Patient agreeable to discharge plan/expected d/c date?: Yes  Family/POA agreeable to discharge plan/expected d/c date?: Yes  Bedside nurse notified of transport plan?: Yes  Special requirements for patient during transport:: Oxygen  Hard copy of narcotic RX sent with patient?: Yes  Hard copy of DNR/Advance Directive sent with patient?: N/A  Advanced directive/code status rescreen completed before discharge? : No  IV antibiotics post discharge?: No  Wound care post discharge?: No     Physical Discharge Disposition: Skilled Nursing Facility       Provider Notifications:        Boyd Kerbs A. Leonor Liv, RN MSN  Case Management  Ph: [phone]  Fax: [phone]

## 2019-01-16 NOTE — Plan of Care (Signed)
NURSE NOTE SUMMARY  [facility] - GI/ENDO/GEN MED   Patient Name: [facility] WHITE   Attending Physician: Esmond Harps, DO   Today's date:   01/16/2019 LOS: 16 days   Shift Summary:                                                              Assumed care of patient at 1900. Patient resting in bed. Patient alert and oriented X4. Patient shows no signs or symptoms of distress. Assessment complete. Vitals noted. Medications administered according to [facility]. Bed in lowest position. Call light within reach. Bed alarm on. Patient denies any needs at this time. Will continue to monitor.    Provider Notifications:      Rapid Response Notifications:  Mobility:      PMP Activity: Step 6 - Walks in Room (01/16/2019  7:18 PM)     Weight tracking:  Family Dynamic:   Last 3 Weights for the past 72 hrs (Last 3 readings):   Weight   01/16/19 0705 63.2 kg (139 lb 4.8 oz)   01/15/19 0400 62.9 kg (138 lb 11.2 oz)   01/14/19 0443 63.5 kg (139 lb 14.4 oz)             Recent Vitals Last Bowel Movement   BP:   (!) 79/40 (01/16/2019  3:38 PM)  Heart Rate: 81 (01/16/2019  3:45 PM)  Temp: 97 F (36.1 C) (01/16/2019  7:51 AM)  Resp Rate: 16 (01/16/2019  3:30 PM)  Weight: 63.2 kg (139 lb 4.8 oz) (01/16/2019  7:05 AM)  SpO2: 96 % (01/16/2019  3:45 PM)   Last BM Date: 01/16/19         Problem: Moderate/High Fall Risk Score >5  Goal: Patient will remain free of falls  Outcome: Progressing  Flowsheets  Taken 01/13/2019 2344 by Deretha Emory, RN  High (Greater than 13): LOW-Fall Interventions Appropriate for Low Fall Risk;LOW-Anticoagulation education for injury risk;MOD-Use of chair-pad alarm when appropriate;MOD-Consider a move closer to Nurses Station;MOD-Remain with patient during toileting;MOD-Place Fall Risk level on whiteboard in room;HIGH-Utilize chair pad alarm for patient while in the chair;HIGH-Apply yellow "Fall Risk" arm band  Taken 01/16/2019 1918 by Orest Dikes, RN  VH Moderate Risk (6-13): ALL REQUIRED LOW  INTERVENTIONS;INITIATE YELLOW "FALL RISK" SIGNAGE;YELLOW NON-SKID SLIPPERS;YELLOW "FALL RISK" ARM BAND;USE OF BED EXIT ALARM IF PATIENT IS CONFUSED OR IMPULSIVE. PLACE RESET BED ALARM SIGN ABOVE BED;PLACE FALL RISK LEVEL ON WHITE BOARD FOR COMMUNICATION PURPOSES IN PATIENT'S ROOM  VH High Risk (Greater than 13): ALL REQUIRED LOW INTERVENTIONS;ALL REQUIRED MODERATE INTERVENTIONS;RED "HIGH FALL RISK" SIGNAGE;A CHAIR PAD ALARM WILL BE USED WHEN PATIENT IS UP SITTING IN A CHAIR;BED ALARM WILL BE ACTIVATED WHEN THE PATEINT IS IN BED WITH SIGNAGE "RESET BED ALARM";PATIENT IS TO BE SUPERVISED FOR ALL TOILETING ACTIVITIES     Problem: Compromised Tissue integrity  Goal: Damaged tissue is healing and protected  Outcome: Progressing  Flowsheets (Taken 01/15/2019 2318 by Maurice Small, RN)  Damaged tissue is healing and protected : Monitor/assess Braden scale every shift;Reposition patient every 2 hours and as needed unless able to reposition self;Increase activity as tolerated/progressive mobility;Relieve pressure to bony prominences for patients at moderate and high risk;Avoid shearing injuries;Keep intact skin clean and dry;Use incontinence wipes for cleaning  urine, stool and caustic drainage. Foley care as needed;Monitor external devices/tubes for correct placement to prevent pressure, friction and shearing;Encourage use of lotion/moisturizer on skin;Monitor patient's hygiene practices  Goal: Nutritional status is improving  Outcome: Progressing  Flowsheets (Taken 01/15/2019 2318 by Maurice Small, RN)  Nutritional status is improving: Allow adequate time for meals;Encourage patient to take dietary supplement(s) as ordered;Include patient/patient care companion in decisions related to nutrition     Problem: Compromised Hemodynamic Status  Goal: Vital signs and fluid balance maintained/improved  Outcome: Progressing  Flowsheets (Taken 01/15/2019 2318 by Maurice Small, RN)  Vital signs and fluid balance are  maintained/improved: Position patient for maximum circulation/cardiac output;Monitor/assess vitals and hemodynamic parameters with position changes;Monitor intake and output. Notify LIP if urine output is less than 30 mL/hour.;Monitor/assess lab values and report abnormal values     Problem: Inadequate Gas Exchange  Goal: Adequate oxygenation and improved ventilation  Outcome: Progressing  Flowsheets (Taken 01/15/2019 2318 by Maurice Small, RN)  Adequate oxygenation and improved ventilation: Assess lung sounds;Monitor SpO2 and treat as needed;Provide mechanical and oxygen support to facilitate gas exchange;Position for maximum ventilatory efficiency;Teach/reinforce use of incentive spirometer 10 times per hour while awake, cough and deep breath as needed;Plan activities to conserve energy: plan rest periods;Increase activity as tolerated/progressive mobility;Consult/collaborate with Respiratory Therapy     Problem: Inadequate Airway Clearance  Goal: Normal respiratory rate/effort achieved/maintained  Outcome: Progressing  Flowsheets (Taken 01/15/2019 2318 by Maurice Small, RN)  Normal respiratory rate/effort achieved/maintained: Plan activities to conserve energy: plan rest periods

## 2019-01-16 NOTE — Progress Note - Problem Oriented Charting Notewrit (Signed)
Pharmacy Consult Warfarin Dosing  Ferol Luz Fahy    Age: 85 y.o.  Weight: 63.2 kg (139 lb 4.8 oz)  Indication: a fib  Goal INR: 2-3  Home warfarin dose =   2.5 mg daily except 5mg  Thurs and Sat  Interacting agent/disease: amiodarone, aspirin, methylprednisolone, Zosyn    Subjective/Objective:   [name] admitted from assisted living with hypoxia and lethargy. Takes warfarin as outpatient reportedly at above dosage, although it is not on patient's home med list. Per facility, home med list is not regularly updated. Received Vitamin K 5 mg po on 2/9 for elevated INR. Multivitamin started 2/14.     Assessment/Plan   INR 2.6 today   Warfarin 0.5 mg po once and keep monitoring. INR steadily rising.   Multivitamin continues   Daily INR   Monitor for signs and symptoms of bleeding   Avoid IM injections    Current warfarin dose =(hold for INR greater than 3.5)  Date 2/8 2/9 2/10 2/11 2/12 2/13 2/14 2-15 2-16 2/17 2/18 2/19 2/20 2/21   INR 8.8 9.7 1.5 1.4 2.1 3.6 4.5 3.8 3.3 2.5 2 2.1 2.2 2.2   dose Hold hold 5 mg 5 mg Hold Hold Hold hold hold 2.5mg  2.5mg  2.5mg  2.5mg  2.5 mg   Hgb 11.1 10.3 9.8 10.3 9.9   10.4  10.3       Plt 259 245 247 286 271   288  289         Date 2/22 2/23 2/24              INR 2.3 2.4 2.6              dose 2.5 mg 1mg                Hgb                 Plt                     Past Medical History:   Diagnosis Date    Abnormal vision     Arthritis     Atrial fibrillation     Congestive heart failure     Glaucoma     Hip fx, right, closed, initial encounter 2010    Hyperlipidemia     Hypertension     Low back pain     Macular degeneration     Nonrheumatic aortic (valve) stenosis 08/11/2017    Shingles         Recent Labs   Lab 01/12/19  0855   Bilirubin, Total 1.1   Protein, Total 6.1   Albumin 3.2*   ALT 19   AST (SGOT) 18       If you have any questions, please contact the pharmacist at [phone].    Lavenia Atlas, PharmD

## 2019-01-17 LAB — CBC AND DIFFERENTIAL
Bands: 1 % (ref 0–10)
Basophils %: 0 % (ref 0.0–3.0)
Basophils Absolute: 0 10*3/uL (ref 0.0–0.3)
Eosinophils %: 0 % (ref 0.0–7.0)
Eosinophils Absolute: 0 10*3/uL (ref 0.0–0.8)
Hematocrit: 32.9 % — ABNORMAL LOW (ref 36.0–48.0)
Hemoglobin: 10.6 gm/dL — ABNORMAL LOW (ref 12.0–16.0)
Lymphocytes Absolute: 0.7 10*3/uL (ref 0.6–5.1)
Lymphocytes: 6 % — ABNORMAL LOW (ref 15.0–46.0)
MCH: 32 pg (ref 28–35)
MCHC: 32 gm/dL (ref 32–36)
MCV: 100 fL (ref 80–100)
MPV: 7.9 fL (ref 6.0–10.0)
Metamyelocytes: 3 % — ABNORMAL HIGH (ref 0–0)
Monocytes Absolute: 1 10*3/uL (ref 0.1–1.7)
Monocytes: 9 % (ref 3.0–15.0)
Myelocytes: 3 % — ABNORMAL HIGH (ref 0–0)
Neutrophils %: 78 % (ref 42.0–78.0)
Neutrophils Absolute: 9.8 10*3/uL — ABNORMAL HIGH (ref 1.7–8.6)
PLT CT: 176 10*3/uL (ref 130–440)
RBC: 3.3 10*6/uL — ABNORMAL LOW (ref 3.80–5.00)
RDW: 17.1 % — ABNORMAL HIGH (ref 11.0–14.0)
WBC: 11.5 10*3/uL — ABNORMAL HIGH (ref 4.0–11.0)

## 2019-01-17 LAB — COMPREHENSIVE METABOLIC PANEL
ALT: 20 U/L (ref 0–55)
AST (SGOT): 14 U/L (ref 10–42)
Albumin/Globulin Ratio: 1.27 Ratio (ref 0.80–2.00)
Albumin: 2.8 gm/dL — ABNORMAL LOW (ref 3.5–5.0)
Alkaline Phosphatase: 54 U/L (ref 40–145)
Anion Gap: 11.8 mMol/L (ref 7.0–18.0)
BUN / Creatinine Ratio: 44 Ratio — ABNORMAL HIGH (ref 10.0–30.0)
BUN: 40 mg/dL — ABNORMAL HIGH (ref 7–22)
Bilirubin, Total: 0.9 mg/dL (ref 0.1–1.2)
CO2: 33 mMol/L — ABNORMAL HIGH (ref 20–30)
Calcium: 8 mg/dL — ABNORMAL LOW (ref 8.5–10.5)
Chloride: 97 mMol/L — ABNORMAL LOW (ref 98–110)
Creatinine: 0.91 mg/dL (ref 0.60–1.20)
EGFR: 53 mL/min/{1.73_m2} — ABNORMAL LOW (ref 60–150)
Globulin: 2.2 gm/dL (ref 2.0–4.0)
Glucose: 99 mg/dL (ref 71–99)
Osmolality Calculated: 285 mOsm/kg (ref 275–300)
Potassium: 3.8 mMol/L (ref 3.5–5.3)
Protein, Total: 5 gm/dL — ABNORMAL LOW (ref 6.0–8.3)
Sodium: 138 mMol/L (ref 136–147)

## 2019-01-17 LAB — VH DEXTROSE STICK GLUCOSE
Glucose POCT: 307 mg/dL — ABNORMAL HIGH (ref 71–99)
Glucose POCT: 223 mg/dL — ABNORMAL HIGH (ref 71–99)
Glucose POCT: 218 mg/dL — ABNORMAL HIGH (ref 71–99)

## 2019-01-17 LAB — PT/INR
PT INR: 2.2 — ABNORMAL HIGH (ref 0.5–1.3)
PT: 22.1 s — ABNORMAL HIGH (ref 9.5–11.5)

## 2019-01-17 MED ORDER — GLUCAGON 1 MG IJ SOLR (WRAP)
1.00 mg | INTRAMUSCULAR | Status: DC | PRN
Start: 2019-01-17 — End: 2019-01-18

## 2019-01-17 MED ORDER — SODIUM CHLORIDE 0.9 % IV BOLUS
250.00 mL | Freq: Once | INTRAVENOUS | Status: AC
Start: 2019-01-17 — End: 2019-01-17
  Administered 2019-01-17: 21:00:00 250 mL via INTRAVENOUS

## 2019-01-17 MED ORDER — INSULIN LISPRO (1 UNIT DIAL) 100 UNIT/ML SC SOPN
1.00 [IU] | PEN_INJECTOR | Freq: Three times a day (TID) | SUBCUTANEOUS | Status: DC
Start: 2019-01-17 — End: 2019-01-18
  Filled 2019-01-17: qty 3

## 2019-01-17 MED ORDER — SODIUM CHLORIDE 0.9 % IV SOLN
INTRAVENOUS | Status: AC
Start: 2019-01-17 — End: 2019-01-17

## 2019-01-17 MED ORDER — WARFARIN SODIUM 2 MG PO TABS
2.00 mg | ORAL_TABLET | Freq: Every day | ORAL | Status: AC
Start: 2019-01-17 — End: 2019-01-17
  Administered 2019-01-17: 17:00:00 2 mg via ORAL
  Filled 2019-01-17: qty 1

## 2019-01-17 MED ORDER — INSULIN LISPRO (1 UNIT DIAL) 100 UNIT/ML SC SOPN
1.00 [IU] | PEN_INJECTOR | Freq: Every evening | SUBCUTANEOUS | Status: DC
Start: 2019-01-17 — End: 2019-01-18
  Administered 2019-01-17: 21:00:00 1 [IU] via SUBCUTANEOUS

## 2019-01-17 MED ORDER — DEXTROSE 10 % IV BOLUS
125.00 mL | INTRAVENOUS | Status: DC | PRN
Start: 2019-01-17 — End: 2019-01-18

## 2019-01-17 MED ORDER — INSULIN LISPRO (1 UNIT DIAL) 100 UNIT/ML SC SOPN
1.00 [IU] | PEN_INJECTOR | Freq: Every day | SUBCUTANEOUS | Status: DC | PRN
Start: 2019-01-17 — End: 2019-01-18

## 2019-01-17 NOTE — Progress Note - Problem Oriented Charting Notewrit (Signed)
Pharmacy Consult Warfarin Dosing  Kelli Brown    Age: 85 y.o.  Weight: 63.1 kg (139 lb 3.2 oz)  Indication: a fib  Goal INR: 2-3  Home warfarin dose =   2.5 mg daily except 5mg  Thurs and Sat  Interacting agent/disease: amiodarone, aspirin, methylprednisolone, Zosyn    Subjective/Objective:   [name] admitted from assisted living with hypoxia and lethargy. Takes warfarin as outpatient reportedly at above dosage, although it is not on patient's home med list. Per facility, home med list is not regularly updated. Received Vitamin K 5 mg po on 2/9 for elevated INR. Multivitamin started 2/14.     Assessment/Plan   INR 2.2 today   Warfarin 2 mg po once and keep monitoring. INR steadily rising.   Multivitamin continues   Daily INR   Monitor for signs and symptoms of bleeding   Avoid IM injections    Current warfarin dose =(hold for INR greater than 3.5)  Date 2/8 2/9 2/10 2/11 2/12 2/13 2/14 2-15 2-16 2/17 2/18 2/19 2/20 2/21   INR 8.8 9.7 1.5 1.4 2.1 3.6 4.5 3.8 3.3 2.5 2 2.1 2.2 2.2   dose Hold hold 5 mg 5 mg Hold Hold Hold hold hold 2.5mg  2.5mg  2.5mg  2.5mg  2.5 mg   Hgb 11.1 10.3 9.8 10.3 9.9   10.4  10.3       Plt 259 245 247 286 271   288  289         Date 2/22 2/23 2/24 2/25             INR 2.3 2.4 2.6 2.2             dose 2.5 mg 1mg  0.5 mg              Hgb                 Plt                     Past Medical History:   Diagnosis Date    Abnormal vision     Arthritis     Atrial fibrillation     Congestive heart failure     Glaucoma     Hip fx, right, closed, initial encounter 2010    Hyperlipidemia     Hypertension     Low back pain     Macular degeneration     Nonrheumatic aortic (valve) stenosis 08/11/2017    Shingles         Recent Labs   Lab 01/17/19  0513   Bilirubin, Total 0.9   Protein, Total 5.0*   Albumin 2.8*   ALT 20   AST (SGOT) 14       If you have any questions, please contact the pharmacist at [phone].    Glendale Chard, PharmD

## 2019-01-17 NOTE — Progress Notes (Addendum)
01/17/19 1045 01/17/19 1047 01/17/19 1050   Vital Signs   BP 104/47 95/46 106/51   MAP (mmHg) 66 (!) 62 69   Patient Position Lying Sitting Sitting      01/17/19 1052   Vital Signs   BP (!) 86/38   MAP (mmHg) (!) 54   Patient Position Standing       Orthostatic BP completed. Patient symptomatic when standing - dizziness and weakness   MD notified.

## 2019-01-17 NOTE — Plan of Care (Addendum)
NURSE NOTE SUMMARY  [facility] - GI/ENDO/GEN MED   Patient Name: [facility] WHITE   Attending Physician: Esmond Harps, DO   Today's date:   01/17/2019 LOS: 17 days   Shift Summary:                                                              A&O x4. Denies pain. Patient states "I feel better than yesterday"   Orthostatic BP completed- see note. MD notified of results. New redness to patients right lower extremity assessed - MD aware.  Call bell within reach and bed alarm on.   1548 Patient diaphoretic VS taken. Rectal temp 99.3. Blood sugar taken - 307. Patient denies SOB & Chest pain. MD made aware. New orders being made.   1650 Patients blood sugar rechecked before dinner 223. Per Dr. Carney Living - hold off of insulin before dinner. If patient remains high before bed time follow sliding scale dose.    Provider Notifications:      Rapid Response Notifications:  Mobility:      PMP Activity: Step 6 - Walks in Room (01/17/2019  8:45 AM)     Weight tracking:  Family Dynamic:   Last 3 Weights for the past 72 hrs (Last 3 readings):   Weight   01/17/19 0550 63.1 kg (139 lb 3.2 oz)   01/16/19 0705 63.2 kg (139 lb 4.8 oz)   01/15/19 0400 62.9 kg (138 lb 11.2 oz)             Recent Vitals Last Bowel Movement   BP: 121/58 (01/17/2019  8:27 AM)  Heart Rate: 88 (01/17/2019  8:27 AM)  Temp: 97.3 F (36.3 C) (01/17/2019  8:27 AM)  Resp Rate: 19 (01/17/2019  8:27 AM)  Weight: 63.1 kg (139 lb 3.2 oz) (01/17/2019  5:50 AM)  SpO2: 99 % (01/17/2019  8:27 AM)   Last BM Date: 01/17/19       Problem: Moderate/High Fall Risk Score >5  Goal: Patient will remain free of falls  Flowsheets (Taken 01/17/2019 0845)  VH Moderate Risk (6-13): ALL REQUIRED LOW INTERVENTIONS;INITIATE YELLOW "FALL RISK" SIGNAGE;YELLOW NON-SKID SLIPPERS;YELLOW "FALL RISK" ARM BAND;USE OF BED EXIT ALARM IF PATIENT IS CONFUSED OR IMPULSIVE. PLACE RESET BED ALARM SIGN ABOVE BED;PLACE FALL RISK LEVEL ON WHITE BOARD FOR COMMUNICATION PURPOSES IN PATIENT'S ROOM

## 2019-01-17 NOTE — Progress Notes (Signed)
Medicine Progress Note - [facility]      Date Time: 01/17/19 2:16 PM  Patient Name: [facility] WHITE  Attending Physician: Esmond Harps, DO    Assessment:                                                                                        Active Problems:    Pneumonia    Acute exacerbation of CHF (congestive heart failure)    Acute respiratory failure with hypoxia    Hypertension    Chronic kidney disease, stage III (moderate)    Hyperlipidemia    Hypoxia        Plan:                                                                                                      Acute hypoxic respiratory failure(Resolved)  Improved. Still on low dose O2 by nc.  Discontinue amiodarone on discharge  Will require prednisone taper     Pneumonia/Pneumonitis  Treated.    Oral candidiasis  OnMagic Mouthwash 15 ml oral 4 times daily.    Orthostatic hypotension  BP continues to drop with standing  Will give IVF    Acute on Chronic dCHF w/ preserved EF 60-65% w/ grade 2 diastolic dysfunction.  Improved. Now on oral Lasix.    CKD stage III  Stable.    Mild anemia due to CKD  H&H stable    Paroxysmal atrial fibrillation  INR is therapeutic.    Hypertension  Blood pressures reviewed. No med change.     Hyperlipidemia  Onpravastatin.  Lines:                                                                                                       Patient Lines/Drains/Airways Status    Active PICC Line / CVC Line / PIV Line / Drain / Airway / Intraosseous Line / Epidural Line / ART Line / Line / Wound / Pressure Ulcer / NG/OG Tube     Name:   Placement date:   Placement time:   Site:   Days:    Peripheral IV 01/01/19 Right Forearm   01/01/19    0030    Forearm   16  Disposition:                                                                                             Today's date: 01/17/2019  Length of Stay: 17  Code Status: NO CPR - SUPPORT OK  Anticipated medical  stability for discharge in : 2 days    Subjective                                                                                             CC: <principal problem not specified>     Patient feels well. Better than yesterday       Review of Systems:                                                                               Review of Systems   Constitutional: Negative for chills and fever.   HENT: Negative for congestion and sore throat.    Respiratory: Negative for cough and shortness of breath.    Cardiovascular: Negative for chest pain and leg swelling.   Gastrointestinal: Negative for abdominal pain, diarrhea, nausea and vomiting.   Genitourinary: Negative for dysuria, frequency and urgency.   Musculoskeletal: Negative for arthralgias and myalgias.   Skin: Negative for pallor and rash.   Neurological: Negative for dizziness and headaches.       Physical Exam:                                                                                     Temp:  [97 F (36.1 C)-97.3 F (36.3 C)] 97.3 F (36.3 C)  Heart Rate:  [78-109] 101  Resp Rate:  [16-20] 19  BP: (79-121)/(38-58) 111/53    Intake/Output Summary (Last 24 hours) at 01/17/2019 1416  Last data filed at 01/17/2019 0800  Gross per 24 hour   Intake 700 ml   Output 900 ml   Net -200 ml       General: Awake, alert, oriented x 3; No acute distress.  HEENT: EOMI, Sclera anicteric  Oropharynx clear without lesions, mucous membranes moist  Glands: No cervical or axillary lymphadenopathy.  Neck:  Supple, no lymphadenopathy, no thyromegaly  Cardiovascular:systolic ejection murmur  Lungs: CTAB, without wheezes, rhonchi, or rales  Abdomen: Soft, non-tender, non-distended; no palpable masses, normoactive bowel sounds, no rebound or guarding  Extremities: bruising of RLE  Neuro: Cranial nerves grossly intact, strength 5/5 in upper and lower extremities, sensation intact  Psych: Normal affect, not depressed   Skin: No rashes or lesions noted    Meds:                                                                                                        Medications were reviewed in the electronic record: [x]       Estimated Creatinine Clearance: 29.3 mL/min (based on SCr of 0.91 mg/dL).  Current Facility-Administered Medications   Medication Dose Route Frequency   . aluminum & magnesium / diphenhydramine / lidocaine / nystatin  15 mL Oral QID   . aspirin EC  81 mg Oral QAM   . dorzolamide  1 drop Left Eye TID   . famotidine  10 mg Oral Daily   . folic acid  1 mg Oral Daily   . guaiFENesin  600 mg Oral Q12H SCH   . loratadine  10 mg Oral Daily   . metoprolol tartrate  12.5 mg Oral Q12H SCH   . multivitamin  1 tablet Oral Daily   . pravastatin  40 mg Oral QAM   . predniSONE  55 mg Oral QAM W/BREAKFAST   . sodium chloride (PF)  3 mL Intravenous Q8H   . warfarin  2 mg Oral Daily at 1800   . warfarin therapy placeholder  1 each Does not apply See Admin Instructions     PRN medications: acetaminophen **OR** acetaminophen **OR** acetaminophen, albuterol, LORazepam, naloxone, ondansetron **OR** ondansetron, prochlorperazine  IV Drips:   . sodium chloride 100 mL/hr at 01/17/19 1114         Labs and Imaging:                                                                                 Results     Procedure Component Value Units Date/Time    CBC and differential [[id number]]  (Abnormal) Collected:  01/17/19 0513    Specimen:  Blood Updated:  01/17/19 0711     WBC 11.5 K/cmm      RBC 3.30 M/cmm      Hemoglobin 10.6 gm/dL      Hematocrit 75.6 %      MCV 100 fL      MCH 32 pg      MCHC 32 gm/dL      RDW 43.3 %      PLT CT 176 K/cmm      MPV 7.9 fL      NEUTROPHIL %  78.0 %      Lymphocytes 6.0 %      Monocytes 9.0 %      Eosinophils % 0.0 %      Basophils % 0.0 %      Bands 1 %      Metamyelocytes 3 %      Myelocytes 3 %      Neutrophils Absolute 9.8 K/cmm      Lymphocytes Absolute 0.7 K/cmm      Monocytes Absolute 1.0 K/cmm      Eosinophils Absolute 0.0 K/cmm      BASO Absolute 0.0 K/cmm      RBC  Morphology RBC Morphology Reviewed     Anisocytosis 1+     Hypochromia 1+     Elliptocytes 1+    Narrative:       Manual differential performed    Prothrombin time/INR [[id number]]  (Abnormal) Collected:  01/17/19 0513    Specimen:  Blood Updated:  01/17/19 0621     PT 22.1 sec      PT INR 2.2    Comprehensive metabolic panel [[id number]]  (Abnormal) Collected:  01/17/19 0513    Specimen:  Plasma Updated:  01/17/19 0617     Sodium 138 mMol/L      Potassium 3.8 mMol/L      Chloride 97 mMol/L      CO2 33 mMol/L      Calcium 8.0 mg/dL      Glucose 99 mg/dL      Creatinine 7.82 mg/dL      BUN 40 mg/dL      Protein, Total 5.0 gm/dL      Albumin 2.8 gm/dL      Alkaline Phosphatase 54 U/L      ALT 20 U/L      AST (SGOT) 14 U/L      Bilirubin, Total 0.9 mg/dL      Albumin/Globulin Ratio 1.27 Ratio      Anion Gap 11.8 mMol/L      BUN/Creatinine Ratio 44.0 Ratio      EGFR 53 mL/min/1.95m2      Osmolality Calculated 285 mOsm/kg      Globulin 2.2 gm/dL     Troponin I [[id number]] Collected:  01/16/19 1613    Specimen:  Plasma Updated:  01/16/19 1657     Troponin I 0.01 ng/mL     Troponin I [[id number]] Collected:  01/16/19 1418    Specimen:  Plasma Updated:  01/16/19 1522     Troponin I 0.02 ng/mL           Microbiology, reviewed and are significant for:  Microbiology Results     Procedure Component Value Units Date/Time    Blood Culture [[id number]] Collected:  01/02/19 1300    Specimen:  Blood from Venipuncture Updated:  01/07/19 1313    Narrative:       Specimen/Source: Blood/Venipuncture  Collected: 01/02/2019 13:00     Status: Final      Last Updated: 01/07/2019 13:10                Culture Result (Final)      No Growth in 5 Days          Blood Culture [[id number]] Collected:  01/02/19 1300    Specimen:  Blood from Venipuncture Updated:  01/07/19 1313    Narrative:       Specimen/Source: Blood/Venipuncture  Collected: 01/02/2019 13:00     Status: Final      Last Updated: 01/07/2019 13:10  Culture Result (Final)       No Growth in 5 Days          Influenza A / B Rapid Test [[id number]] Collected:  12/31/18 1108    Specimen:  Nasal Wash Updated:  12/31/18 1143     Influenza A Negative     Influenza B Negative     Comment: Method: Jarvis Morgan    The sensitivity for this method is between 90% and 95% for Influenza A and around 90% for Influenza B. The specificity for both Influenza A and B is around 96%. False positive results may occur, especially when the prevalence of Influenza activity is low. This is more likely with Influenza B due to its lower prevalence. Clinical conditions, including the prevalence of influenza activity, should be considered in the interpretation of results. If clinically indicated, results may be confirmed with PCR testing.  The above 2 analytes were performed by [facility] [phone])  [address] [address]         Narrative:       Influenza A antigen detection tests are unable to distinquish between novel and seasonal influenza A.    A negative result for either Influenza A or B antigen does not exclude influenza virus infection. Clinical correlation required.    All positive influenza antigen tests (A or B) require placement of patient on droplet precaution isolation.    Nares MRSA Detection by DNA Amplification [[id number]] Collected:  01/04/19 0224    Specimen:  Nares Updated:  01/04/19 0415     MRSA No MRSA detected.     Comment: The above 1 analytes were performed by [facility] [id number])  [address],[place] [address]               Imaging, reviewed and are significant for:  Radiology Results (24 Hour)     ** No results found for the last 24 hours. **              Signed:  Darral Dash, MD   SVRP R1, [id number]

## 2019-01-17 NOTE — Discharge Instr - AVS First Page (Addendum)
Congestive Heart Failure (CHF) instructions:    1. Weigh patient every day and keep a log for physician review.   2. Goal weight is 140 pounds.  3. Contact physician if weight increases more than 3 pounds over the goal weight or if patient experiences shortness of breath.    4. Give dose of Lasix PRN if weight increases above 142 lb.  5. Fluid restriction:  2  L/day.   Diet:  Heart healthy, low salt (under 2 grams/day).    Activity: Out of bed only with assist.   Take precautions to prevent falls.     Monitor for orthostatic hypotension.    Patient was provided with a prescription for a prednisone taper.     Patients coumadin was changed from 5mg  daily to 2 mg daily. Please check an INR on Monday 01/23/19. Defer treatment to staff physician for medication changes and management.

## 2019-01-18 LAB — CBC AND DIFFERENTIAL
Basophils %: 0 % (ref 0.0–3.0)
Basophils Absolute: 0 10*3/uL (ref 0.0–0.3)
Eosinophils %: 1.6 % (ref 0.0–7.0)
Eosinophils Absolute: 0.2 10*3/uL (ref 0.0–0.8)
Hematocrit: 30.4 % — ABNORMAL LOW (ref 36.0–48.0)
Hemoglobin: 9.7 gm/dL — ABNORMAL LOW (ref 12.0–16.0)
Lymphocytes Absolute: 0.9 10*3/uL (ref 0.6–5.1)
Lymphocytes: 8.5 % — ABNORMAL LOW (ref 15.0–46.0)
MCH: 32 pg (ref 28–35)
MCHC: 32 gm/dL (ref 32–36)
MCV: 100 fL (ref 80–100)
MPV: 8.4 fL (ref 6.0–10.0)
Monocytes Absolute: 0.8 10*3/uL (ref 0.1–1.7)
Monocytes: 7 % (ref 3.0–15.0)
Neutrophils %: 82.9 % — ABNORMAL HIGH (ref 42.0–78.0)
Neutrophils Absolute: 9 10*3/uL — ABNORMAL HIGH (ref 1.7–8.6)
PLT CT: 139 10*3/uL (ref 130–440)
RBC: 3.04 10*6/uL — ABNORMAL LOW (ref 3.80–5.00)
RDW: 17.6 % — ABNORMAL HIGH (ref 11.0–14.0)
WBC: 10.9 10*3/uL (ref 4.0–11.0)

## 2019-01-18 LAB — VH DEXTROSE STICK GLUCOSE
Glucose POCT: 99 mg/dL (ref 71–99)
Glucose POCT: 94 mg/dL (ref 71–99)
Glucose POCT: 156 mg/dL — ABNORMAL HIGH (ref 71–99)

## 2019-01-18 LAB — BASIC METABOLIC PANEL
Anion Gap: 7.6 mMol/L (ref 7.0–18.0)
BUN / Creatinine Ratio: 36.7 Ratio — ABNORMAL HIGH (ref 10.0–30.0)
BUN: 29 mg/dL — ABNORMAL HIGH (ref 7–22)
CO2: 33 mMol/L — ABNORMAL HIGH (ref 20–30)
Calcium: 7.9 mg/dL — ABNORMAL LOW (ref 8.5–10.5)
Chloride: 102 mMol/L (ref 98–110)
Creatinine: 0.79 mg/dL (ref 0.60–1.20)
EGFR: 63 mL/min/{1.73_m2} (ref 60–150)
Glucose: 80 mg/dL (ref 71–99)
Osmolality Calculated: 282 mOsm/kg (ref 275–300)
Potassium: 3.6 mMol/L (ref 3.5–5.3)
Sodium: 139 mMol/L (ref 136–147)

## 2019-01-18 LAB — THYROID STIMULATING HORMONE (TSH), REFLEX ON ABNORMAL TO FREE T4, SERUM
T4 Free: 0.54 ng/dL — ABNORMAL LOW (ref 0.70–1.48)
TSH: 6.87 u[IU]/mL — ABNORMAL HIGH (ref 0.40–4.20)

## 2019-01-18 LAB — PT/INR
PT INR: 2 — ABNORMAL HIGH (ref 0.5–1.3)
PT: 20.7 s — ABNORMAL HIGH (ref 9.5–11.5)

## 2019-01-18 MED ORDER — WARFARIN SODIUM 2 MG PO TABS
2.0000 mg | ORAL_TABLET | Freq: Every day | ORAL | Status: DC
Start: 2019-01-18 — End: 2019-01-18
  Filled 2019-01-18: qty 1

## 2019-01-18 MED ORDER — PREDNISONE 20 MG PO TABS
ORAL_TABLET | ORAL | 0 refills | Status: AC
Start: 2019-01-18 — End: 2019-02-17

## 2019-01-18 MED ORDER — WARFARIN SODIUM 2 MG PO TABS
2.00 mg | ORAL_TABLET | Freq: Every day | ORAL | 0 refills | Status: AC
Start: 2019-01-18 — End: 2019-02-17

## 2019-01-18 NOTE — Progress Notes (Deleted)
Medicine Progress Note - [facility]      Date Time: 01/18/19 9:25 AM  Patient Name: Kelli Brown  Attending Physician: Esmond Harps, DO    Assessment:                                                                                        Active Problems:    Pneumonia    Acute exacerbation of CHF (congestive heart failure)    Acute respiratory failure with hypoxia    Hypertension    Chronic kidney disease, stage III (moderate)    Hyperlipidemia    Hypoxia        Plan:                                                                                                      Acute hypoxic respiratory failure(Resolved)  Improved. Still on low dose O2 by nc.  Will require prednisone taper    Pneumonia/Pneumonitis  Treated.    Oral candidiasis  OnMagic Mouthwash 15 ml oral 4 times daily.    Orthostatic hypotension  BP continues to drop with standing  TSH pending  May consider midodrine    Acute on Chronic dCHF w/ preserved EF 60-65% w/ grade 2 diastolic dysfunction.  Improved. Now on oral Lasix.    CKD stage III  Stable.    Mild anemia due to CKD  H&H stable    Paroxysmal atrial fibrillation  INR is therapeutic.    Hypertension  Blood pressures reviewed. No med change.     Hyperlipidemia  Onpravastatin.  DVT PPx: Therapeutic coumadin  Lines:                                                                                                       Patient Lines/Drains/Airways Status    Active PICC Line / CVC Line / PIV Line / Drain / Airway / Intraosseous Line / Epidural Line / ART Line / Line / Wound / Pressure Ulcer / NG/OG Tube     Name:   Placement date:   Placement time:   Site:   Days:    Peripheral IV 01/01/19 Right Forearm   01/01/19    0030    Forearm   17  Disposition:                                                                                             Today's date: 01/18/2019  Length of Stay: 18  Code Status: NO CPR - SUPPORT OK   Anticipated medical stability for discharge in : 3 days    Subjective                                                                                             CC: <principal problem not specified>     Complains of abdominal pain today. She has not had a bowel movement yet today.       Review of Systems:                                                                               Review of Systems   Constitutional: Negative for chills and fever.   HENT: Negative for congestion and sore throat.    Respiratory: Negative for cough and shortness of breath.    Cardiovascular: Negative for chest pain and leg swelling.   Gastrointestinal: Positive for abdominal pain. Negative for diarrhea, nausea and vomiting.   Genitourinary: Negative for dysuria, frequency and urgency.   Musculoskeletal: Negative for arthralgias and myalgias.   Skin: Negative for pallor and rash.   Neurological: Negative for dizziness and headaches.       Physical Exam:                                                                                     Temp:  [97 F (36.1 C)-97.3 F (36.3 C)] 97.3 F (36.3 C)  Heart Rate:  [82-111] 89  Resp Rate:  [16-20] 16  BP: (81-124)/(38-60) 98/49    Intake/Output Summary (Last 24 hours) at 01/18/2019 0925  Last data filed at 01/18/2019 0506  Gross per 24 hour   Intake 690 ml   Output 1000 ml   Net -310 ml       General: Awake, alert, oriented x 3; No acute distress.  HEENT: EOMI, Sclera anicteric  Oropharynx clear without lesions, mucous membranes  moist  Glands: No cervical or axillary lymphadenopathy.  Neck: Supple, no lymphadenopathy, no thyromegaly  Cardiovascular: RRR, no murmurs, rubs or gallops  Lungs: CTAB, without wheezes, rhonchi, or rales  Abdomen: Soft, non-tender, non-distended; no palpable masses, normoactive bowel sounds, no rebound or guarding  Extremities:right lower extremity dark red, blanching, non-tender, no swelling  Neuro: Cranial nerves grossly intact, strength 5/5 in upper and lower  extremities, sensation intact  Psych: Normal affect, not depressed   Skin: No rashes or lesions noted    Meds:                                                                                                       Medications were reviewed in the electronic record: [x]       Estimated Creatinine Clearance: 34.2 mL/min (based on SCr of 0.79 mg/dL).  Current Facility-Administered Medications   Medication Dose Route Frequency   . aluminum & magnesium / diphenhydramine / lidocaine / nystatin  15 mL Oral QID   . aspirin EC  81 mg Oral QAM   . dorzolamide  1 drop Left Eye TID   . famotidine  10 mg Oral Daily   . folic acid  1 mg Oral Daily   . guaiFENesin  600 mg Oral Q12H SCH   . insulin lispro (1 Unit Dial)  1-9 Units Subcutaneous TID AC    And   . insulin lispro (1 Unit Dial)  1-7 Units Subcutaneous QHS   . loratadine  10 mg Oral Daily   . multivitamin  1 tablet Oral Daily   . pravastatin  40 mg Oral QAM   . predniSONE  55 mg Oral QAM W/BREAKFAST   . sodium chloride (PF)  3 mL Intravenous Q8H   . warfarin therapy placeholder  1 each Does not apply See Admin Instructions     PRN medications: acetaminophen **OR** acetaminophen **OR** acetaminophen, albuterol, dextrose, glucagon (rDNA), NSG Communication: Glucose POCT order (AC, HS) **AND** insulin lispro (1 Unit Dial) **AND** insulin lispro (1 Unit Dial) **AND** insulin lispro (1 Unit Dial), LORazepam, naloxone, ondansetron **OR** ondansetron, prochlorperazine  IV Drips:       Labs and Imaging:                                                                                 Results     Procedure Component Value Units Date/Time    Dextrose Stick Glucose [[id number]] Collected:  01/18/19 0821    Specimen:  Blood Updated:  01/18/19 0837     Glucose, POCT 94 mg/dL     Basic Metabolic Panel [[id number]]  (Abnormal) Collected:  01/18/19 0629    Specimen:  Plasma Updated:  01/18/19 0720     Sodium 139 mMol/L      Potassium  3.6 mMol/L      Chloride 102 mMol/L      CO2 33 mMol/L       Calcium 7.9 mg/dL      Glucose 80 mg/dL      Creatinine 9.60 mg/dL      BUN 29 mg/dL      Anion Gap 7.6 mMol/L      BUN/Creatinine Ratio 36.7 Ratio      EGFR 63 mL/min/1.54m2      Osmolality Calculated 282 mOsm/kg     Prothrombin time/INR [[id number]]  (Abnormal) Collected:  01/18/19 0629    Specimen:  Blood Updated:  01/18/19 0716     PT 20.7 sec      PT INR 2.0    CBC and differential [[id number]]  (Abnormal) Collected:  01/18/19 0629    Specimen:  Blood Updated:  01/18/19 0703     WBC 10.9 K/cmm      RBC 3.04 M/cmm      Hemoglobin 9.7 gm/dL      Hematocrit 13.0 %      MCV 100 fL      MCH 32 pg      MCHC 32 gm/dL      RDW 86.5 %      PLT CT 139 K/cmm      MPV 8.4 fL      NEUTROPHIL % 82.9 %      Lymphocytes 8.5 %      Monocytes 7.0 %      Eosinophils % 1.6 %      Basophils % 0.0 %      Neutrophils Absolute 9.0 K/cmm      Lymphocytes Absolute 0.9 K/cmm      Monocytes Absolute 0.8 K/cmm      Eosinophils Absolute 0.2 K/cmm      BASO Absolute 0.0 K/cmm     Dextrose Stick Glucose [[id number]] Collected:  01/18/19 0334    Specimen:  Blood Updated:  01/18/19 0355     Glucose, POCT 99 mg/dL     Dextrose Stick Glucose [[id number]]  (Abnormal) Collected:  01/17/19 2108    Specimen:  Blood Updated:  01/17/19 2127     Glucose, POCT 218 mg/dL     Dextrose Stick Glucose [[id number]]  (Abnormal) Collected:  01/17/19 1645    Specimen:  Blood Updated:  01/17/19 1702     Glucose, POCT 223 mg/dL     Dextrose Stick Glucose [[id number]]  (Abnormal) Collected:  01/17/19 1543    Specimen:  Blood Updated:  01/17/19 1609     Glucose, POCT 307 mg/dL           Microbiology, reviewed and are significant for:  Microbiology Results     Procedure Component Value Units Date/Time    Blood Culture [[id number]] Collected:  01/02/19 1300    Specimen:  Blood from Venipuncture Updated:  01/07/19 1313    Narrative:       Specimen/Source: Blood/Venipuncture  Collected: 01/02/2019 13:00     Status: Final      Last Updated: 01/07/2019 13:10                 Culture Result (Final)      No Growth in 5 Days          Blood Culture [[id number]] Collected:  01/02/19 1300    Specimen:  Blood from Venipuncture Updated:  01/07/19 1313    Narrative:       Specimen/Source: Blood/Venipuncture  Collected: 01/02/2019 13:00     Status:  Final      Last Updated: 01/07/2019 13:10                Culture Result (Final)      No Growth in 5 Days          Influenza A / B Rapid Test [[id number]] Collected:  12/31/18 1108    Specimen:  Nasal Wash Updated:  12/31/18 1143     Influenza A Negative     Influenza B Negative     Comment: Method: Jarvis Morgan    The sensitivity for this method is between 90% and 95% for Influenza A and around 90% for Influenza B. The specificity for both Influenza A and B is around 96%. False positive results may occur, especially when the prevalence of Influenza activity is low. This is more likely with Influenza B due to its lower prevalence. Clinical conditions, including the prevalence of influenza activity, should be considered in the interpretation of results. If clinically indicated, results may be confirmed with PCR testing.  The above 2 analytes were performed by [facility] Main Lab [phone])  [address] [address]         Narrative:       Influenza A antigen detection tests are unable to distinquish between novel and seasonal influenza A.    A negative result for either Influenza A or B antigen does not exclude influenza virus infection. Clinical correlation required.    All positive influenza antigen tests (A or B) require placement of patient on droplet precaution isolation.    Nares MRSA Detection by DNA Amplification [[id number]] Collected:  01/04/19 0224    Specimen:  Nares Updated:  01/04/19 0415     MRSA No MRSA detected.     Comment: The above 1 analytes were performed by [facility] Main Lab [phone])  [address],[place] [address]               Imaging, reviewed and are significant for:  Radiology  Results (24 Hour)     ** No results found for the last 24 hours. **              Signed:  Darral Dash, MD   SVRP R1, [phone]

## 2019-01-18 NOTE — Progress Note - Problem Oriented Charting Notewrit (Signed)
Pharmacy Consult Warfarin Dosing  Kelli Brown    Age: 85 y.o.  Weight: 65.1 kg (143 lb 8 oz)  Indication: a fib  Goal INR: 2-3  Home warfarin dose =   2.5 mg daily except 5mg  Thurs and Sat  Interacting agent/disease: amiodarone, aspirin, methylprednisolone, Zosyn    Subjective/Objective:   [name] admitted from assisted living with hypoxia and lethargy. Takes warfarin as outpatient reportedly at above dosage, although it is not on patient's home med list. Per facility, home med list is not regularly updated. Received Vitamin K 5 mg po on 2/9 for elevated INR. Multivitamin started 2/14.     Assessment/Plan   INR 2 today, aware INR is decreasing, and it's likely due to warfarin 0.5mg  given on 2/24.   Warfarin 2 mg po once and keep monitoring. INR steadily rising.   Multivitamin continues   Daily INR   Monitor for signs and symptoms of bleeding   Avoid IM injections    Current warfarin dose =(hold for INR greater than 3.5)  Date 2/8 2/9 2/10 2/11 2/12 2/13 2/14 2-15 2-16 2/17 2/18 2/19 2/20 2/21   INR 8.8 9.7 1.5 1.4 2.1 3.6 4.5 3.8 3.3 2.5 2 2.1 2.2 2.2   dose Hold hold 5 mg 5 mg Hold Hold Hold hold hold 2.5mg  2.5mg  2.5mg  2.5mg  2.5 mg   Hgb 11.1 10.3 9.8 10.3 9.9   10.4  10.3       Plt 259 245 247 286 271   288  289         Date 2/22 2/23 2/24 2/25 2/26            INR 2.3 2.4 2.6 2.2 2            dose 2.5 mg 1mg  0.5 mg 2 mg             Hgb                 Plt                     Past Medical History:   Diagnosis Date    Abnormal vision     Arthritis     Atrial fibrillation     Congestive heart failure     Glaucoma     Hip fx, right, closed, initial encounter 2010    Hyperlipidemia     Hypertension     Low back pain     Macular degeneration     Nonrheumatic aortic (valve) stenosis 08/11/2017    Shingles         Recent Labs   Lab 01/17/19  0513   Bilirubin, Total 0.9   Protein, Total 5.0*   Albumin 2.8*   ALT 20   AST (SGOT) 14       If you have any questions, please contact the pharmacist at  [phone].    Glendale Chard, PharmD

## 2019-01-18 NOTE — Progress Notes (Signed)
Quick Doc  [facility] - GI/ENDO/GEN MED   Patient Name: [facility] WHITE   Attending Physician: Esmond Harps, DO   Today's date:   01/18/2019 LOS: 18 days   Expected Discharge Date      Quick  Assessment:                                                              ReAdmit Risk Score: 32    CM Comments: 01/16/19 RNCM (PH) ADM acute hypoxic resp failure, PNA, dCHF. weaning O2 now at 2L/min Bed offer accepted by pt for [facility] today, Room 101A. VMT W/C van transport arranged for 1300.  Dr. Pollie Friar and C. Hoxton RN notified.     Physical Discharge Disposition: Skilled Nursing Facility                          Receiving facility, unit and room number:: [facility] Room 101A  Nursing report phone number:: [phone]  Facility fax number:: [phone]  Mode of Transportation: Wheelchair Tamala Bari up time: 1300  Patient/Family/POA notified of transfer plan: Yes  Patient agreeable to discharge plan/expected d/c date?: Yes  Family/POA agreeable to discharge plan/expected d/c date?: Yes  Bedside nurse notified of transport plan?: Yes  Special requirements for patient during transport:: Oxygen  Hard copy of narcotic RX sent with patient?: N/A  Hard copy of DNR/Advance Directive sent with patient?: No  Advanced directive/code status rescreen completed before discharge? : No  IV antibiotics post discharge?: No  Wound care post discharge?: No     Physical Discharge Disposition: Skilled Nursing Facility       Provider Notifications:        Boyd Kerbs A. Leonor Liv, RN MSN  Case Management  Ph: [phone]  Fax: [phone]

## 2019-01-18 NOTE — Discharge Summary (Signed)
DISCHARGE SUMMARY    Patient Name: Kelli Brown, Kelli Brown  DOB:Dec 22, 1918  GUY:[id number]    Attending Physician: Esmond Harps, DO  Resident Physician: Darral Dash, MD  Primary Care Physician: Flossie Dibble, MD    Date of Admission: 12/31/2018  Date of Discharge: 01/18/2019  Length of Stay in the Hospital: 18    Admission Diagnoses:                                                                   Acute respiratory failure [J96.00]  Hypoxia [R09.02]  Pulmonary edema with congestive heart failure [I50.1]  Supratherapeutic international normalized ratio (INR) [R79.1]    Discharge Diagnoses:                                                                     Active Hospital Problems    Diagnosis POA    Hypertension Unknown    Chronic kidney disease, stage III (moderate) Unknown    Hyperlipidemia Unknown      Resolved Hospital Problems    Diagnosis POA    Hypoxia Unknown    Acute respiratory failure with hypoxia Yes    Acute exacerbation of CHF (congestive heart failure) Yes    Pneumonia Yes     Acute hypoxic respiratory failure, resolved  Saturating >90% on RA  Prednisone taper on discharge    Pneumonia/Pneumonitis, resolved  Treated.    Oral candidiasis  Magic mouth wash    Orthostatic hypotension, resolved  Orthostatics negative on discharge     Acute on Chronic dCHF w/ preserved EF 60-65% w/ grade 2 diastolic dysfunction.  Improved. Now on oral Lasix.    CKD stage III  Stable.    Mild anemia due to CKD  H&H stable    Paroxysmal atrial fibrillation  Coumadin dose decreased to 2 mg daily  To be monitored by staff physician    Hypertension  BP meds discontinued on discharged    Hyperlipidemia  Onpravastatin.    Disposition: [facility]    Discharge Condition: stable    Admission H&P summary:                                                                Kelli Brown is a 85 y.o. female patient admitted for the complaints of worsening shortness of breath for about a week associated with generalized  weakness.  Patient denies any leg swelling, chest pain, dizziness, palpitations, nausea, vomiting, fever, chills, diarrhea or constipation.  She denies any changes in urinary output.  She is from [facility] assisted living facility  (See full History and Physical for details.)    Hospital Course:  Presented with shortness of breath with acute hypoxic respiratory failure likely due to pneumonia vs pneumonitis vs fluid overload vs amiodarone toxicity. CXR at that time was consistent with multifocal pneumonia/pneumonitis. Pt was started on ceftriaxone and doxycycline, IV solumedrol, duonebs. However, she clinically worsened with fever and leukocytosis. Medications changed to Vanc and zosyn. Patient was also aggressively diuresed with IV lasix. Patient required HFNC and was unable to wean for several days. Condition monitored by intermittent chest x-rays. There was consideration for palliative care, however patients condition acutely improved. She was slowly weaned off of HFNC and transitioned to [facility]. Patient was clinically improving and appropriate for discharge. However, she had several episodes of orthostatic hypotension. Diuresis was discontinued and she was recussitated with IVF as needed. On day of discharge, patient feels well and has no complaints. She is off oxygen and able to ambulate with assistance. No orthostatic episodes noted today.         Discharge Medications:                                                                      Discharge Medication List      Taking    aspirin EC 81 MG EC tablet  Dose:  81 mg  Take 81 mg by mouth every morning.     calcium carbonate 1500 (600 Ca) MG Tabs tablet  Dose:  1,500 mg  Take 1,500 mg by mouth every morning     CENTRUM WOMEN PO  Dose:  1 tablet  Take 1 tablet by mouth every morning.     dorzolamide 2 % ophthalmic solution  Dose:  1 drop  Commonly known as:  TRUSOPT  Place 1 drop into  the left eye 3 (three) times daily.      folic acid 1 MG tablet  Dose:  1 mg  Commonly known as:  FOLVITE  Take 1 tablet (1 mg total) by mouth daily     furosemide 40 MG tablet  Dose:  40 mg  Commonly known as:  LASIX  Take 40 mg by mouth Once each morning except Saturday and Sunday.     potassium chloride 20 MEQ tablet  Dose:  20 mEq  Commonly known as:  K-DUR,KLOR-CON  Take 20 mEq by mouth every morning      pravastatin 40 MG tablet  Dose:  40 mg  Commonly known as:  PRAVACHOL  Take 40 mg by mouth every morning      predniSONE 20 MG tablet  Commonly known as:  DELTASONE  Start taking on:  January 18, 2019  Take 3 tablets (60 mg total) by mouth daily for 5 days, THEN 2.5 tablets (50 mg total) daily for 5 days, THEN 2 tablets (40 mg total) daily for 5 days, THEN 1.5 tablets (30 mg total) daily for 5 days, THEN 1 tablet (20 mg total) daily for 5 days, THEN 0.5 tablets (10 mg total) daily for 5 days.     vitamin D 25 MCG (1000 UT) tablet  Dose:  1,000 Units  Commonly known as:  CHOLECALCIFEROL  Take 1,000 Units by mouth every morning     warfarin 2 MG tablet  Dose:  2 mg  What changed:     medication strength  how much to take   when to take this   additional instructions  Commonly known as:  COUMADIN  Take 1 tablet (2 mg total) by mouth daily        STOP taking these medications    amiodarone 100 MG tablet  Commonly known as:  PACERONE     metoprolol succinate XL 50 MG 24 hr tablet  Commonly known as:  TOPROL-XL            Procedures/Radiology performed:                                                    Xr Chest 2 Views    Result Date: 12/31/2018  Multifocal pneumonia versus edema: Clinical correlation is recommended. ReadingStation:[email or web address]    Ct Chest Wo Contrast    Result Date: 12/31/2018  1. Bilateral airspace disease/pneumonia or nonspecific pneumonitis. Correlate with clinical findings and recommend follow-up to resolution. 2. Superimposed CHF with mild pulmonary edema and small bilateral pleural effusions.  ReadingStation:[email or web address]    Xr Chest Ap Portable    Result Date: 01/11/2019  Persistent bilateral airspace disease or edema. Stable heart size. Atherosclerotic changes of the thoracic aorta. Osteoarthritic changes of thoracic spine. Minimal change from 01/09/2019. ReadingStation:[email or web address]    Xr Chest Ap Portable    Result Date: 01/09/2019  1. Bilateral infiltrates are unchanged. 2. Small right pleural effusion. ReadingStation:[email or web address]    Xr Chest Ap Portable    Result Date: 01/08/2019  1.  Persistent widespread airspace opacity, with improved aeration since February 14. 2.  Somewhat nodular density at the right base, not well-seen on the previous exam. Follow-up to radiographic resolution suggested. 3.  Pleural effusions, decreased in size. ReadingStation:[email or web address]    Xr Chest Ap Portable    Result Date: 01/06/2019  No change. ReadingStation:[email or web address]    Xr Chest Ap Portable    Result Date: 01/04/2019  1.  Persistent diffuse airspace disease. 2.  Appearance on CT worrisome for interstitial lung disease. ReadingStation:[email or web address]    Xr Chest Ap Portable    Result Date: 01/02/2019  Diffuse bilateral airspace disease left greater than right. Small right effusion. No observed pneumothorax. Normal heart size. ReadingStation:[email or web address]    XR CHEST 2 VIEWS  CT CHEST WO CONTRAST  XR CHEST AP PORTABLE  XR CHEST AP PORTABLE  XR CHEST AP PORTABLE  XR CHEST AP PORTABLE  XR CHEST AP PORTABLE  XR CHEST AP PORTABLE  ECHOCARDIOGRAM ADULT COMPLETE W CLR/ DOPP WAVEFORM      No orders of the defined types were placed in this encounter.      Discharge Objective Data:                                                                                 Temp:  [97 F (36.1 C)-97.3 F (36.3 C)] 97.3 F (36.3 C)  Heart Rate:  [82-111] 89  Resp Rate:  [16-20] 16  BP: (81-124)/(39-60) 113/58  Wt Readings from Last 3 Encounters:   01/18/19 65.1 kg (143 lb 8 oz)   03/16/18  70.1 kg (154 lb 9.6 oz)   03/09/18 68 kg (150 lb)       General: awake, alert, oriented x 3;  no acute distress.  Cardiovascular: systolic ejection murmur   Lungs: clear to auscultation bilaterally, without wheezing, rhonchi, or rales  Abdomen: soft, non-tender, non-distended; no palpable masses, no hepatosplenomegaly, normoactive bowel sounds, no rebound or guarding  Extremities: right calf with erythema and bruising, no warmth or tenderness    Discharge Labs:  Results     Procedure Component Value Units Date/Time    TSH, Abn Reflex to Free T4, Serum [[id number]] Collected:  01/18/19 1107    Specimen:  Plasma Updated:  01/18/19 1107    Dextrose Stick Glucose [[id number]] Collected:  01/18/19 0821    Specimen:  Blood Updated:  01/18/19 0837     Glucose, POCT 94 mg/dL     Basic Metabolic Panel [[id number]]  (Abnormal) Collected:  01/18/19 0629    Specimen:  Plasma Updated:  01/18/19 0720     Sodium 139 mMol/L      Potassium 3.6 mMol/L      Chloride 102 mMol/L      CO2 33 mMol/L      Calcium 7.9 mg/dL      Glucose 80 mg/dL      Creatinine 8.46 mg/dL      BUN 29 mg/dL      Anion Gap 7.6 mMol/L      BUN/Creatinine Ratio 36.7 Ratio      EGFR 63 mL/min/1.40m2      Osmolality Calculated 282 mOsm/kg     Prothrombin time/INR [[id number]]  (Abnormal) Collected:  01/18/19 0629    Specimen:  Blood Updated:  01/18/19 0716     PT 20.7 sec      PT INR 2.0    CBC and differential [[id number]]  (Abnormal) Collected:  01/18/19 0629    Specimen:  Blood Updated:  01/18/19 0703     WBC 10.9 K/cmm      RBC 3.04 M/cmm      Hemoglobin 9.7 gm/dL      Hematocrit 25.3 %      MCV 100 fL      MCH 32 pg      MCHC 32 gm/dL      RDW 66.4 %      PLT CT 139 K/cmm      MPV 8.4 fL      NEUTROPHIL % 82.9 %      Lymphocytes 8.5 %      Monocytes 7.0 %      Eosinophils % 1.6 %      Basophils % 0.0 %      Neutrophils Absolute 9.0 K/cmm      Lymphocytes Absolute 0.9 K/cmm      Monocytes Absolute 0.8 K/cmm      Eosinophils Absolute 0.2 K/cmm      BASO Absolute 0.0 K/cmm     Dextrose Stick Glucose [[id number]] Collected:  01/18/19 0334    Specimen:  Blood  Updated:  01/18/19 0355     Glucose, POCT 99 mg/dL     Dextrose Stick Glucose [[id number]]  (Abnormal) Collected:  01/17/19 2108    Specimen:  Blood Updated:  01/17/19 2127     Glucose, POCT 218 mg/dL     Dextrose Stick Glucose [[id number]]  (Abnormal) Collected:  01/17/19 1645    Specimen:  Blood Updated:  01/17/19 1702     Glucose, POCT 223 mg/dL     Dextrose Stick Glucose [[id number]]  (  Abnormal) Collected:  01/17/19 1543    Specimen:  Blood Updated:  01/17/19 1609     Glucose, POCT 307 mg/dL           Consultations:                                                                                   Treatment Team:   Attending Provider: Esmond Harps, DO  Resident: Venia Carbon, MD      Discharge Instructions:                                                                         Placement:  Patient will be going [facility]    Discharge Activity:  Mobility only with assistance    Discharge Diet: The patient is asked to make an attempt to improve diet and exercise patterns to aid in medical management of this problem.    Discharge Code Status: NO CPR - SUPPORT OK     Follow up with Flossie Dibble, MD, take all medications as prescribed. Call MD for persistent Nausea/Vomiting, Diarrhea, Oliguria, Chest Pain, Shortness of Breath, Bleeding, Temperature over 101F, or any other concerns.    Complete instructions and follow up are in the patient's After Visit Summary (AVS).    Discharge Condition:                                                                          The patient was discharged in stable condition.  Complete instructions and follow up are in the patient's After Visit Summary (AVS).    Signed by:   Darral Dash, MD   SVRP R1, [phone]      CC: Flossie Dibble, MD

## 2019-01-18 NOTE — Plan of Care (Addendum)
NURSE NOTE SUMMARY  [facility] - GI/ENDO/GEN MED   Patient Name: [facility] WHITE   Attending Physician: Esmond Harps, DO   Today's date:   01/18/2019 LOS: 18 days   Shift Summary:                                                              1900 Assumed care of pt. Assessed per flow sheets, medicated per MAR. Reviewed plan of care with pt. BG intact and monitored. Pt denies any pain at this time. Pt is positive for orthostatic BP, IVF given earlier in the day, MD notified of BP and pt was asymptomatic,  250 ml bolus of normal saline ordered. Recheck of pt's BP once back in bed was WNL. Call bell within reach and bed alarm on, pt is resting comfortably, will continue to monitor   Provider Notifications:   2110 MD notified of pt's positive orthostatic BP but asymptomatic. New orders placed   Rapid Response Notifications:  Mobility:      PMP Activity: Step 6 - Walks in Room (01/18/2019  2:31 AM)     Weight tracking:  Family Dynamic:   Last 3 Weights for the past 72 hrs (Last 3 readings):   Weight   01/17/19 0550 63.1 kg (139 lb 3.2 oz)   01/16/19 0705 63.2 kg (139 lb 4.8 oz)   01/15/19 0400 62.9 kg (138 lb 11.2 oz)             Recent Vitals Last Bowel Movement   BP: 124/60 (01/17/2019  9:32 PM)  Heart Rate: 89 (01/17/2019  9:32 PM)  Temp: 97 F (36.1 C) (01/17/2019  9:00 PM)  Resp Rate: 17 (01/17/2019  9:00 PM)  Weight: 63.1 kg (139 lb 3.2 oz) (01/17/2019  5:50 AM)  SpO2: 94 % (01/17/2019  9:04 PM)   Last BM Date: 01/17/19     Problem: Moderate/High Fall Risk Score >5  Goal: Patient will remain free of falls  Outcome: Progressing  Flowsheets (Taken 01/18/2019 0231)  VH High Risk (Greater than 13): ALL REQUIRED MODERATE INTERVENTIONS;ALL REQUIRED LOW INTERVENTIONS;RED "HIGH FALL RISK" SIGNAGE;PATIENT IS TO BE SUPERVISED FOR ALL TOILETING ACTIVITIES;A CHAIR PAD ALARM WILL BE USED WHEN PATIENT IS UP SITTING IN A CHAIR;BED ALARM WILL BE ACTIVATED WHEN THE PATEINT IS IN BED WITH SIGNAGE "RESET BED ALARM";A  safety companion may be used when deemed appropriate by the Primary RN and Clinical Administrator;Keep door open for better visibility;Include family/significant other in multidisciplinary discussion regarding plan of care as appropriate;Use assistive devices     Problem: Compromised Tissue integrity  Goal: Damaged tissue is healing and protected  Outcome: Progressing  Flowsheets (Taken 01/18/2019 0232)  Damaged tissue is healing and protected : Reposition patient every 2 hours and as needed unless able to reposition self; Monitor/assess Braden scale every shift; Increase activity as tolerated/progressive mobility; Relieve pressure to bony prominences for patients at moderate and high risk; Avoid shearing injuries; Keep intact skin clean and dry; Use bath wipes, not soap and water, for daily bathing; Monitor external devices/tubes for correct placement to prevent pressure, friction and shearing     Problem: Compromised Hemodynamic Status  Goal: Vital signs and fluid balance maintained/improved  Outcome: Progressing  Flowsheets (Taken 01/18/2019 0232)  Vital signs and fluid balance are  maintained/improved: Position patient for maximum circulation/cardiac output; Monitor/assess vitals and hemodynamic parameters with position changes; Monitor and compare daily weight; Monitor/assess lab values and report abnormal values; Monitor intake and output. Notify LIP if urine output is less than 30 mL/hour.

## 2019-01-18 NOTE — Plan of Care (Addendum)
NURSE NOTE SUMMARY  [facility] - GI/ENDO/GEN MED   Patient Name: Kelli Brown   Attending Physician: Esmond Harps, DO   Today's date:   01/18/2019 LOS: 18 days   Shift Summary:                                                              Patient A&O x4. Right lower extremity red - no pain expressed by patient. Call bell within reach and bed alarm on.   1210 report called and given to RN Chastity at [facility] IV removed. Transport to be here at 1300.    Provider Notifications:      Rapid Response Notifications:  Mobility:      PMP Activity: Step 6 - Walks in Room (01/18/2019  7:20 AM)     Weight tracking:  Family Dynamic:   Last 3 Weights for the past 72 hrs (Last 3 readings):   Weight   01/18/19 0405 65.1 kg (143 lb 8 oz)   01/17/19 0550 63.1 kg (139 lb 3.2 oz)   01/16/19 0705 63.2 kg (139 lb 4.8 oz)             Recent Vitals Last Bowel Movement   BP: 98/49 (01/18/2019  8:01 AM)  Heart Rate: 89 (01/18/2019  8:01 AM)  Temp: 97.3 F (36.3 C) (01/18/2019  8:01 AM)  Resp Rate: 16 (01/18/2019  8:01 AM)  Weight: 65.1 kg (143 lb 8 oz) (01/18/2019  4:05 AM)  SpO2: 96 % (01/18/2019  8:01 AM)   Last BM Date: 01/17/19       Problem: Moderate/High Fall Risk Score >5  Goal: Patient will remain free of falls  Flowsheets (Taken 01/17/2019 0845)  VH Moderate Risk (6-13): ALL REQUIRED LOW INTERVENTIONS;INITIATE YELLOW "FALL RISK" SIGNAGE;YELLOW NON-SKID SLIPPERS;YELLOW "FALL RISK" ARM BAND;USE OF BED EXIT ALARM IF PATIENT IS CONFUSED OR IMPULSIVE. PLACE RESET BED ALARM SIGN ABOVE BED;PLACE FALL RISK LEVEL ON Brown BOARD FOR COMMUNICATION PURPOSES IN PATIENT'S ROOM

## 2019-03-08 ENCOUNTER — Emergency Department: Payer: Medicare Other

## 2019-03-08 ENCOUNTER — Emergency Department
Admission: EM | Admit: 2019-03-08 | Discharge: 2019-03-08 | Disposition: A | Discharge status: Home / Self Care | Payer: Medicare Other | Attending: Emergency Medicine | Admitting: Emergency Medicine

## 2019-03-08 DIAGNOSIS — R600 Localized edema: Secondary | ICD-10-CM

## 2019-03-08 DIAGNOSIS — N289 Disorder of kidney and ureter, unspecified: Secondary | ICD-10-CM | POA: Insufficient documentation

## 2019-03-08 DIAGNOSIS — I509 Heart failure, unspecified: Secondary | ICD-10-CM | POA: Insufficient documentation

## 2019-03-08 DIAGNOSIS — I110 Hypertensive heart disease with heart failure: Secondary | ICD-10-CM | POA: Insufficient documentation

## 2019-03-08 LAB — CBC AND DIFFERENTIAL
Basophils %: 0.7 % (ref 0.0–3.0)
Basophils Absolute: 0.1 10*3/uL (ref 0.0–0.3)
Eosinophils %: 7.8 % — ABNORMAL HIGH (ref 0.0–7.0)
Eosinophils Absolute: 0.6 10*3/uL (ref 0.0–0.8)
Hematocrit: 29.3 % — ABNORMAL LOW (ref 36.0–48.0)
Hemoglobin: 9.4 gm/dL — ABNORMAL LOW (ref 12.0–16.0)
Lymphocytes Absolute: 1.8 10*3/uL (ref 0.6–5.1)
Lymphocytes: 23.7 % (ref 15.0–46.0)
MCH: 33 pg (ref 28–35)
MCHC: 32 gm/dL (ref 32–36)
MCV: 103 fL — ABNORMAL HIGH (ref 80–100)
MPV: 6.8 fL (ref 6.0–10.0)
Monocytes Absolute: 0.7 10*3/uL (ref 0.1–1.7)
Monocytes: 9.9 % (ref 3.0–15.0)
Neutrophils %: 57.9 % (ref 42.0–78.0)
Neutrophils Absolute: 4.3 10*3/uL (ref 1.7–8.6)
PLT CT: 309 10*3/uL (ref 130–440)
RBC: 2.84 10*6/uL — ABNORMAL LOW (ref 3.80–5.00)
RDW: 20.3 % — ABNORMAL HIGH (ref 11.0–14.0)
WBC: 7.5 10*3/uL (ref 4.0–11.0)

## 2019-03-08 LAB — BASIC METABOLIC PANEL
Anion Gap: 19.5 mMol/L — ABNORMAL HIGH (ref 7.0–18.0)
BUN / Creatinine Ratio: 12.9 Ratio (ref 10.0–30.0)
BUN: 16 mg/dL (ref 7–22)
CO2: 21.4 mMol/L (ref 20.0–30.0)
Calcium: 9.5 mg/dL (ref 8.5–10.5)
Chloride: 101 mMol/L (ref 98–110)
Creatinine: 1.24 mg/dL — ABNORMAL HIGH (ref 0.60–1.20)
EGFR: 36 mL/min/{1.73_m2} — ABNORMAL LOW (ref 60–150)
Glucose: 135 mg/dL — ABNORMAL HIGH (ref 71–99)
Osmolality Calculated: 279 mOsm/kg (ref 275–300)
Potassium: 3.9 mMol/L (ref 3.5–5.3)
Sodium: 138 mMol/L (ref 136–147)

## 2019-03-08 LAB — ECG 12-LEAD
Interpretation Text: NORMAL
P Wave Axis: 32 deg
P-R Interval: 164 ms
Patient Age: 89 years
Q-T Interval(Corrected): 430 ms
Q-T Interval: 359 ms
QRS Axis: 7 deg
QRS Duration: 86 ms
T Axis: 41 years
Ventricular Rate: 86 //min

## 2019-03-08 LAB — VH I-STAT INR: i-STAT INR: 1.6 (ref 0.0–3.0)

## 2019-03-08 LAB — VH I-STAT INR NOTIFICATION

## 2019-03-08 LAB — B-TYPE NATRIURETIC PEPTIDE: B-Natriuretic Peptide: 167.8 pg/mL — ABNORMAL HIGH (ref 0.0–100.0)

## 2019-03-08 LAB — TROPONIN I: Troponin I: <0.01 ng/mL (ref 0.00–0.02)

## 2019-03-08 MED ORDER — FUROSEMIDE 20 MG PO TABS
60.0000 mg | ORAL_TABLET | Freq: Every day | ORAL | 0 refills | Status: DC
Start: 2019-03-08 — End: 2019-03-08

## 2019-03-08 MED ORDER — FUROSEMIDE 80 MG PO TABS
80.0000 mg | ORAL_TABLET | Freq: Every day | ORAL | 0 refills | Status: DC
Start: 2019-03-08 — End: 2019-03-16

## 2019-03-08 NOTE — Discharge Instructions (Signed)
BLE swelling: Increase Lasix to 80 MG once a day. Return for worsening swelling, onset of shortness of breath or chest pain.  Continue with potassium as prescribed.    Anticoagulation: Recheck INR in 2 days, possible adjustment of INR.        Taking a Diuretic  Your healthcare provider has prescribed a diuretic, or "water pill," to help your body get rid of extra water and salt and maintain fluid balance. Taking your diuretic can help you feel better, breathe better, move more easily, and have more energy.      Take your medication early in the day at the same time each day.    The name of my diuretic is:   ________________________________________  Medicine tips   Read the fact sheet that comes with your medicine. It tells you when and how to take it. Ask for a medicine sheet if you don't get one.   If you take2 or more doses each day, take the lastone before dinner if you can. That way you'll get up fewer times during the night to go to the bathroom. However, make sure you have enough time between doses during the day.   If you miss a dose, take it as soon as you remember. If it's almost time for the next dose, skip the missed dose. Don't take a double dose.   If you miss 2 or more consecutive doses, call your healthcare provider. You may be at risk for fluid buildup.    For your safety   Follow your healthcare provider's guidelines for potassium intake. You may need to take a potassium supplement. Or, you may need to avoid potassium supplements, salt substitutes with potassium, or large amounts of high-potassium foods (such as bananas, potatoes, broccoli, and milk). Recommendations for potassium will depend on the type of diuretic you are prescribed along with your kidney function and other factors. Your healthcare provider will likely want to check your potassium level regularly while you are taking this medicine.   Talk to your healthcare provider about whether it's safe for you to drink alcohol while  taking this medicine.   Get up slowly when you are sitting or lying down. This helps prevent dizziness andfalls due to dizziness.   Ask your healthcare provider or pharmacist before you take any other prescription or nonprescription medicine or herbal supplements. Some of them may interact with your diuretic and keep it from working correctly.   Limit exposure to sunlight. A diuretic may increase your sensitivity to the sun. Even brief sun exposure may cause skin rash, itching, redness, or other discoloration. It may also lead to severe sunburn. To protect your skin do the following:  ? Stay out of direct sunlight, especially between 10 a.m. and 2 p.m., whenever possible. The amount of direct sunlight exposure you are have can vary depending on where you live.  ? Apply a daily sunblock of at least SPF 15 (or higher) to any exposed skin, including your lips.  ? Wear protective clothing, such as a hat and sunglasses, when you are outdoors.  ? Don't use sunlamps and tanning booths.  ? Long-sleeve shirts and pants in the summer can help protect your skin.    When to seek medical advice  Call your healthcare provider right away if any of these occur:   Have diarrhea, constipation, nausea or vomiting   Lose your appetite or notice a rapid or excessive weight gain   Feel extremely tired or weak   Have  shortness of breath or trouble breathing or swallowing   Have numbness or tingling in your hands, feet, or lips or a ringing in your ears   Feel lightheaded when getting up after sitting or lying down   Have headaches, blurred vision, or feel a sense of confusion   Have muscle cramps or joint pain   Have chest pains or changes in your heartbeat   Have an excessive thirst or a dry mouth   Notice a skin rash   Gain more than2 pounds in 1 day or4 pounds in 1 week. Ask your healthcare provider for his or her specific direction.   Have increased swelling in your legs, feet, or belly (abdomen)   Have rapid or  irregular heartbeats   Have any other unusual symptoms  [employer] last reviewed this educational content on 05/23/2018   2000-2020 [employer], [place]. [address], [place], [place] [address]. All rights reserved. This information is not intended as a substitute for professional medical care. Always follow your healthcare professional's instructions.

## 2019-03-08 NOTE — ED Triage Notes (Signed)
Patient was brought into the ED via EMS for BLE swelling.   Pt denies having SOB.

## 2019-03-08 NOTE — ED Notes (Signed)
Bed: N5-A  Expected date:   Expected time:   Means of arrival:   Comments:  EMS

## 2019-03-08 NOTE — ED Provider Notes (Signed)
Physician/Midlevel provider first contact with patient: 03/08/19 1021         History     Chief Complaint   Patient presents with    Leg Swelling       Kelli Brown  is a 85 y.o. female brought in via EMS presenting with BLE edema:    BLE edema: Pt reports 1.5 months hx of worsening BLE edema. Reports she was admitted at [facility] ED 12/31/18-01/18/19 secondary to acute respiratory failure and hypoxia, pulmonary edema with CHF and following discharge from hospital pt's BLE edema has been gradually worsening. Pt lives at [facility] facility and reports she has been compliant with all rx including Coumadin and Lasix. Denies accompanying shortness of breath, cough, wheeze, fever, sore throat or chest pain.     Past Medical History:   Diagnosis Date    Abnormal vision     Arthritis     Atrial fibrillation     Congestive heart failure     Glaucoma     Hip fx, right, closed, initial encounter 2010    Hyperlipidemia     Hypertension     Low back pain     Macular degeneration     Nonrheumatic aortic (valve) stenosis 08/11/2017    Shingles        Past Surgical History:   Procedure Laterality Date    APPENDECTOMY      EYE SURGERY      cateracts    HIP SURGERY Bilateral     Tubes tied    TONSILLECTOMY      TUBAL LIGATION         Family History   Problem Relation Age of Onset    Stroke Mother     Hypertension Mother     Coronary artery disease Mother     Heart disease Mother     Cancer Father     Stroke Sister     CABG Brother        Social  Social History     Tobacco Use    Smoking status: Never Smoker    Smokeless tobacco: Never Used   Substance Use Topics    Alcohol use: No    Drug use: No       .     Allergies   Allergen Reactions    Codeine Nausea And Vomiting and Other (See Comments)     LIGHTHEADEDNESS.    Darvon [Propoxyphene] Nausea And Vomiting and Other (See Comments)     LIGHTHEADEDNESS.    Phenobarbital Swelling and Rash       Home Medications             aspirin EC  81 MG EC tablet     Take 81 mg by mouth every morning.         calcium carbonate 1500 (600 Ca) MG Tab tablet     Take 1,500 mg by mouth every morning     dorzolamide (TRUSOPT) 2 % ophthalmic solution     Place 1 drop into the left eye 3 (three) times daily.        escitalopram (LEXAPRO) 5 MG tablet     Take 5 mg by mouth daily     folic acid (FOLVITE) 1 MG tablet     Take 1 tablet (1 mg total) by mouth daily     furosemide (LASIX) 20 MG tablet     Take 3 tablets (60 mg total) by mouth daily for 14 days  levothyroxine (SYNTHROID, LEVOTHROID) 50 MCG tablet          Multiple Vitamins-Minerals (CENTRUM WOMEN PO)     Take 1 tablet by mouth every morning.     potassium chloride (K-DUR,KLOR-CON) 20 MEQ tablet     Take 20 mEq by mouth every morning        pravastatin (PRAVACHOL) 40 MG tablet     Take 40 mg by mouth every morning        vitamin D (CHOLECALCIFEROL) 25 MCG (1000 UT) tablet     Take 1,000 Units by mouth every morning     warfarin (COUMADIN) 2.5 MG tablet     TAKE 1 TABLET (2.5 MG) BY ORAL ROUTE ONCE DAILY AS DIRECTED     warfarin (COUMADIN) 3 MG tablet                                 Ongoing Comment    Stottlemyer, Minerva Areola, PharmD    12/31/2018  1:21 PM    12/31/2018 - Resident of [facility] in [place], [place]. Per [employer] staff, med lists are not updated if pt self-administers medications. Ms. Bettenhausen self administers medications and therefore warfarin and amiodarone are not on her [facility] list. May wish to follow-up with Dr. Hal Hope office during normal business hours           Review of Systems   Constitutional: Negative for activity change and fever.   HENT: Negative for congestion, ear pain and sore throat.    Respiratory: Negative for cough and shortness of breath.    Cardiovascular: Positive for leg swelling (BLE ). Negative for chest pain.   Gastrointestinal: Negative for abdominal pain, blood in stool (Melena neg), diarrhea, nausea and vomiting.   Genitourinary: Negative for difficulty  urinating, dysuria, flank pain and frequency.   Musculoskeletal:        Denies Injuries   Skin: Negative for color change, pallor and rash.   Neurological: Negative for weakness and numbness.   Psychiatric/Behavioral: Negative for behavioral problems.   All other systems reviewed and are negative.    Physical Exam    BP: 131/55, Heart Rate: 88, Temp: 97.9 F (36.6 C), Resp Rate: 18, SpO2: 95 %, Weight: 62.6 kg     Physical Exam  Vitals signs and nursing note reviewed. Exam conducted with a chaperone present.   Constitutional:       Appearance: Normal appearance. She is well-developed and normal weight.   HENT:      Head: Normocephalic and atraumatic.   Neck:      Musculoskeletal: Normal range of motion and neck supple.   Cardiovascular:      Rate and Rhythm: Normal rate and regular rhythm.      Pulses:           Radial pulses are 2+ on the right side and 2+ on the left side.        Popliteal pulses are 2+ on the right side and 2+ on the left side.      Heart sounds: Normal heart sounds.      Comments: Toes warm and well perfused  Pulmonary:      Effort: Pulmonary effort is normal.      Breath sounds: Normal breath sounds. No wheezing.   Chest:      Chest wall: No tenderness.   Abdominal:      General: Abdomen is flat. Bowel sounds are  normal.      Palpations: Abdomen is soft.      Tenderness: There is no abdominal tenderness. There is no right CVA tenderness or left CVA tenderness.   Musculoskeletal: Normal range of motion.      Right lower leg: 2+ Pitting Edema (knee distal) present.      Left lower leg: 2+ Pitting Edema (knee distal) present.      Comments: LLE:  Lower leg (anterior, lateral aspect): 3 x 3 cm contusion, non tender   Skin:     General: Skin is warm and dry.   Neurological:      General: No focal deficit present.      Mental Status: She is alert and oriented to person, place, and time.      Motor: No abnormal muscle tone.   Psychiatric:         Mood and Affect: Mood normal.         Behavior:  Behavior normal.         Thought Content: Thought content normal.         Judgment: Judgment normal.         I have interpreted the EKG at the time it was performed and my official reading is as follows:     Last EKG Result     Procedure Component Value Units Date/Time    ECG 12 lead Stat [[id number]] Collected:  03/08/19 1018     Updated:  03/08/19 1430     Patient Age 73 years      Patient DOB 01-Jun-1919     Patient Height --     Patient Weight --     Interpretation Text --     Normal Sinus Rhythm  Reversal of R-wave progression anteriorly  Otherwise within normal limits  Compared to ECG 01/16/2019 12:20:47  Minimal change    Electronically Signed On 03-08-2019 14:30:31 EDT by Burna Cash       Physician Interpreter Burna Cash     Ventricular Rate 86 //min      QRS Duration 86 ms      P-R Interval 164 ms      Q-T Interval 359 ms      Q-T Interval(Corrected) 430 ms      P Wave Axis 32 deg      QRS Axis 7 deg      T Axis 41 years           DDX   DDX lower extremity edema:  Includes but is not limited to superficial thrombophlebitis, DVT, venous stasis disease, CHF, Myocardial infarction, myocarditis,pulmonary hypertension/right heart failure, obstructive pulmonary emboli, diastolic dysfunction, cor pulmonale (COPD, pulmonary fibrosis, PE, OSA), pericarditis, pericardial effusion, obstructive retroperitoneal or pelvic mass, venous stasis disease, DVT, lymphatic obstruction,hepatic insufficiency. nephrotic syndrome,  medication adverse reaction (direct vasodilators, CCBs, NSAIDs).    ED Course     ED Medication Orders (From admission, onward)    None                Procedures    Results     Labs Reviewed   CBC AND DIFFERENTIAL - Abnormal; Notable for the following components:       Result Value    RBC 2.84 (*)     Hemoglobin 9.4 (*)     Hematocrit 29.3 (*)     MCV 103 (*)     RDW 20.3 (*)     Eosinophils % 7.8 (*)     All other components within  normal limits    Narrative:     Manual differential performed    B-TYPE NATRIURETIC PEPTIDE - Abnormal; Notable for the following components:    B-Natriuretic Peptide 167.8 (*)     All other components within normal limits    Narrative:     This order is a replacement of the rejected order with accession number [id number].   BASIC METABOLIC PANEL - Abnormal; Notable for the following components:    Glucose 135 (*)     Creatinine 1.24 (*)     Anion Gap 19.5 (*)     EGFR 36 (*)     All other components within normal limits    Narrative:     This order is a replacement of the rejected order with accession number [id number].   TROPONIN I    Narrative:     This order is a replacement of the rejected order with accession number [id number].   VH I-STAT INR NOTIFICATION   VH I-STAT INR   TROPONIN I   TROPONIN I       US Venous Leg Bilateral   Final Result   No evidence of deep vein thrombosis either leg.      ReadingStation:[email or web address]      XR Chest 2 Views   Final Result   Findings consistent with mild congestive failure or fluid overload.      ReadingStation:[email or web address]          Lab and Radiology results discussed with patient.       MDM   Increased bilateral lower extremity edema: Suspect mild congestive heart failure exacerbation.  Without evidence of DVT.  Patient currently on 60 mg daily she will be increased to 80 mg daily. She presently has a prescription for potassium 20 mEq daily.    Anemia: Mild and slightly down from 1 month previous.  She denies melena hematochezia.  INR is pending.    Renal insufficiency: Mild and chronic.  Follow-up with repeat laboratory work indicated as an outpatient to check both renal function as well as potassium with increased dose of Lasix.    Mild subtherapeutic INR: Repeat INR 2 days, possible adjustment of Coumadin per primary care physician.    Questions answered discharge instructions reviewed at bedside.     Vitals: Within normal limits.    Clinical Impression & Disposition     Clinical Impression  Final diagnoses:   Edema of both legs   Renal  insufficiency        ED Disposition     ED Disposition Condition Date/Time Comment    Discharge  Wed Mar 08, 2019  2:16 PM Kelli Brown discharge to home/self care.    Condition at disposition: Stable           New Prescriptions    No medications on file        Flossie Dibble, MD  [address]  [place] [address]  [phone]    Schedule an appointment as soon as possible for a visit today  For follow up telehealth appointment, repeat CBC and BMP      The documentation recorded by my scribe, Alois Cliche, accurately reflects the services I personally performed and the decisions made by me Burna Cash, MD         Cloyde Reams Lum Keas, MD  03/16/19 1053

## 2019-03-13 ENCOUNTER — Emergency Department: Payer: Medicare Other

## 2019-03-13 ENCOUNTER — Inpatient Hospital Stay
Admission: EM | Admit: 2019-03-13 | Discharge: 2019-03-16 | DRG: 291 | Disposition: A | Discharge status: Home / Self Care | Payer: Medicare Other | Attending: Internal Medicine | Admitting: Internal Medicine

## 2019-03-13 DIAGNOSIS — Z9851 Tubal ligation status: Secondary | ICD-10-CM

## 2019-03-13 DIAGNOSIS — I480 Paroxysmal atrial fibrillation: Secondary | ICD-10-CM | POA: Diagnosis present

## 2019-03-13 DIAGNOSIS — N183 Chronic kidney disease, stage 3 unspecified: Secondary | ICD-10-CM

## 2019-03-13 DIAGNOSIS — Z809 Family history of malignant neoplasm, unspecified: Secondary | ICD-10-CM

## 2019-03-13 DIAGNOSIS — D631 Anemia in chronic kidney disease: Secondary | ICD-10-CM | POA: Diagnosis present

## 2019-03-13 DIAGNOSIS — N179 Acute kidney failure, unspecified: Secondary | ICD-10-CM | POA: Diagnosis present

## 2019-03-13 DIAGNOSIS — Z7982 Long term (current) use of aspirin: Secondary | ICD-10-CM

## 2019-03-13 DIAGNOSIS — I5033 Acute on chronic diastolic (congestive) heart failure: Secondary | ICD-10-CM | POA: Diagnosis present

## 2019-03-13 DIAGNOSIS — Z886 Allergy status to analgesic agent status: Secondary | ICD-10-CM

## 2019-03-13 DIAGNOSIS — E039 Hypothyroidism, unspecified: Secondary | ICD-10-CM | POA: Diagnosis present

## 2019-03-13 DIAGNOSIS — R0902 Hypoxemia: Secondary | ICD-10-CM | POA: Diagnosis present

## 2019-03-13 DIAGNOSIS — Z823 Family history of stroke: Secondary | ICD-10-CM

## 2019-03-13 DIAGNOSIS — M1990 Unspecified osteoarthritis, unspecified site: Secondary | ICD-10-CM | POA: Diagnosis present

## 2019-03-13 DIAGNOSIS — I350 Nonrheumatic aortic (valve) stenosis: Secondary | ICD-10-CM | POA: Diagnosis present

## 2019-03-13 DIAGNOSIS — R0602 Shortness of breath: Secondary | ICD-10-CM

## 2019-03-13 DIAGNOSIS — E876 Hypokalemia: Secondary | ICD-10-CM | POA: Diagnosis not present

## 2019-03-13 DIAGNOSIS — M7989 Other specified soft tissue disorders: Secondary | ICD-10-CM

## 2019-03-13 DIAGNOSIS — Z7901 Long term (current) use of anticoagulants: Secondary | ICD-10-CM

## 2019-03-13 DIAGNOSIS — R05 Cough: Secondary | ICD-10-CM

## 2019-03-13 DIAGNOSIS — Z8249 Family history of ischemic heart disease and other diseases of the circulatory system: Secondary | ICD-10-CM

## 2019-03-13 DIAGNOSIS — Z79890 Hormone replacement therapy (postmenopausal): Secondary | ICD-10-CM

## 2019-03-13 DIAGNOSIS — Z66 Do not resuscitate: Secondary | ICD-10-CM | POA: Diagnosis present

## 2019-03-13 DIAGNOSIS — Z885 Allergy status to narcotic agent status: Secondary | ICD-10-CM

## 2019-03-13 DIAGNOSIS — H3530 Unspecified macular degeneration: Secondary | ICD-10-CM | POA: Diagnosis present

## 2019-03-13 DIAGNOSIS — Z9849 Cataract extraction status, unspecified eye: Secondary | ICD-10-CM

## 2019-03-13 DIAGNOSIS — J849 Interstitial pulmonary disease, unspecified: Secondary | ICD-10-CM | POA: Diagnosis present

## 2019-03-13 DIAGNOSIS — H547 Unspecified visual loss: Secondary | ICD-10-CM | POA: Diagnosis present

## 2019-03-13 DIAGNOSIS — Z9049 Acquired absence of other specified parts of digestive tract: Secondary | ICD-10-CM

## 2019-03-13 DIAGNOSIS — E785 Hyperlipidemia, unspecified: Secondary | ICD-10-CM | POA: Diagnosis present

## 2019-03-13 DIAGNOSIS — Z96643 Presence of artificial hip joint, bilateral: Secondary | ICD-10-CM | POA: Diagnosis present

## 2019-03-13 DIAGNOSIS — I130 Hypertensive heart and chronic kidney disease with heart failure and stage 1 through stage 4 chronic kidney disease, or unspecified chronic kidney disease: Principal | ICD-10-CM | POA: Diagnosis present

## 2019-03-13 DIAGNOSIS — I272 Pulmonary hypertension, unspecified: Secondary | ICD-10-CM | POA: Diagnosis present

## 2019-03-13 LAB — PT/INR
PT INR: 1.6 — ABNORMAL HIGH (ref 0.5–1.3)
PT: 16.8 s — ABNORMAL HIGH (ref 9.5–11.5)

## 2019-03-13 LAB — COMPREHENSIVE METABOLIC PANEL
ALT: 12 U/L (ref 0–55)
AST (SGOT): 20 U/L (ref 10–42)
Albumin/Globulin Ratio: 1.3 Ratio (ref 0.80–2.00)
Albumin: 3.5 gm/dL (ref 3.5–5.0)
Alkaline Phosphatase: 73 U/L (ref 40–145)
Anion Gap: 14.8 mMol/L (ref 7.0–18.0)
BUN / Creatinine Ratio: 13.6 Ratio (ref 10.0–30.0)
BUN: 20 mg/dL (ref 7–22)
Bilirubin, Total: 0.9 mg/dL (ref 0.1–1.2)
CO2: 27 mMol/L (ref 20–30)
Calcium: 9.2 mg/dL (ref 8.5–10.5)
Chloride: 104 mMol/L (ref 98–110)
Creatinine: 1.47 mg/dL — ABNORMAL HIGH (ref 0.60–1.20)
EGFR: 30 mL/min/{1.73_m2} — ABNORMAL LOW (ref 60–150)
Globulin: 2.7 gm/dL (ref 2.0–4.0)
Glucose: 90 mg/dL (ref 71–99)
Osmolality Calculated: 285 mOsm/kg (ref 275–300)
Potassium: 3.8 mMol/L (ref 3.5–5.3)
Protein, Total: 6.2 gm/dL (ref 6.0–8.3)
Sodium: 142 mMol/L (ref 136–147)

## 2019-03-13 LAB — CBC AND DIFFERENTIAL
Basophils %: 0.4 % (ref 0.0–3.0)
Basophils Absolute: 0 10*3/uL (ref 0.0–0.3)
Eosinophils %: 6.8 % (ref 0.0–7.0)
Eosinophils Absolute: 0.5 10*3/uL (ref 0.0–0.8)
Hematocrit: 31 % — ABNORMAL LOW (ref 36.0–48.0)
Hemoglobin: 9.8 gm/dL — ABNORMAL LOW (ref 12.0–16.0)
Lymphocytes Absolute: 2.3 10*3/uL (ref 0.6–5.1)
Lymphocytes: 32.5 % (ref 15.0–46.0)
MCH: 32 pg (ref 28–35)
MCHC: 32 gm/dL (ref 32–36)
MCV: 102 fL — ABNORMAL HIGH (ref 80–100)
MPV: 6.9 fL (ref 6.0–10.0)
Monocytes Absolute: 0.8 10*3/uL (ref 0.1–1.7)
Monocytes: 11.3 % (ref 3.0–15.0)
Neutrophils %: 49.1 % (ref 42.0–78.0)
Neutrophils Absolute: 3.4 10*3/uL (ref 1.7–8.6)
PLT CT: 267 10*3/uL (ref 130–440)
RBC: 3.05 10*6/uL — ABNORMAL LOW (ref 3.80–5.00)
RDW: 19.7 % — ABNORMAL HIGH (ref 11.0–14.0)
WBC: 7 10*3/uL (ref 4.0–11.0)

## 2019-03-13 LAB — C-REACTIVE PROTEIN: C-Reactive Protein: 0.88 mg/dL — ABNORMAL HIGH (ref 0.02–0.80)

## 2019-03-13 LAB — TROPONIN I: Troponin I: 0.01 ng/mL (ref 0.00–0.02)

## 2019-03-13 LAB — B-TYPE NATRIURETIC PEPTIDE: B-Natriuretic Peptide: 205.8 pg/mL — ABNORMAL HIGH (ref 0.0–100.0)

## 2019-03-13 MED ORDER — WARFARIN SODIUM 3 MG PO TABS
3.0000 mg | ORAL_TABLET | Freq: Every day | ORAL | Status: DC
Start: 2019-03-13 — End: 2019-03-16
  Administered 2019-03-13 – 2019-03-15 (×3): 3 mg via ORAL
  Filled 2019-03-13 (×4): qty 1

## 2019-03-13 MED ORDER — ACETAMINOPHEN 160 MG/5ML PO SOLN
650.00 mg | ORAL | Status: DC | PRN
Start: 2019-03-13 — End: 2019-03-16

## 2019-03-13 MED ORDER — VH WARFARIN THERAPY PLACEHOLDER
1.00 | Status: DC
Start: 2019-03-13 — End: 2019-03-16

## 2019-03-13 MED ORDER — VH POTASSIUM CHLORIDE CRYS ER 20 MEQ PO TBCR (WRAP)
20.00 meq | EXTENDED_RELEASE_TABLET | Freq: Every morning | ORAL | Status: DC
Start: 2019-03-14 — End: 2019-03-16
  Administered 2019-03-14 – 2019-03-16 (×3): 20 meq via ORAL
  Filled 2019-03-13 (×4): qty 1

## 2019-03-13 MED ORDER — PROCHLORPERAZINE EDISYLATE 10 MG/2ML IJ SOLN
5.00 mg | INTRAMUSCULAR | Status: DC | PRN
Start: 2019-03-13 — End: 2019-03-16

## 2019-03-13 MED ORDER — POLYETHYLENE GLYCOL 3350 17 G PO PACK
17.00 g | PACK | ORAL | Status: DC | PRN
Start: 2019-03-13 — End: 2019-03-16

## 2019-03-13 MED ORDER — ALBUTEROL SULFATE (2.5 MG/3ML) 0.083% IN NEBU
2.50 mg | INHALATION_SOLUTION | Freq: Four times a day (QID) | RESPIRATORY_TRACT | Status: DC
Start: 2019-03-13 — End: 2019-03-14
  Filled 2019-03-13 (×6): qty 3

## 2019-03-13 MED ORDER — ONDANSETRON 4 MG PO TBDP
4.00 mg | ORAL_TABLET | Freq: Three times a day (TID) | ORAL | Status: DC | PRN
Start: 2019-03-13 — End: 2019-03-16

## 2019-03-13 MED ORDER — ALBUTEROL SULFATE (2.5 MG/3ML) 0.083% IN NEBU
2.50 mg | INHALATION_SOLUTION | Freq: Four times a day (QID) | RESPIRATORY_TRACT | Status: DC | PRN
Start: 2019-03-13 — End: 2019-03-16
  Administered 2019-03-13: 20:00:00 2.5 mg via RESPIRATORY_TRACT

## 2019-03-13 MED ORDER — ONDANSETRON HCL 4 MG/2ML IJ SOLN
4.00 mg | Freq: Three times a day (TID) | INTRAMUSCULAR | Status: DC | PRN
Start: 2019-03-13 — End: 2019-03-16

## 2019-03-13 MED ORDER — FUROSEMIDE 10 MG/ML IJ SOLN
20.00 mg | Freq: Two times a day (BID) | INTRAMUSCULAR | Status: DC
Start: 2019-03-14 — End: 2019-03-15
  Administered 2019-03-14 – 2019-03-15 (×3): 20 mg via INTRAVENOUS
  Filled 2019-03-13 (×4): qty 2

## 2019-03-13 MED ORDER — ACETAMINOPHEN 325 MG PO TABS
650.0000 mg | ORAL_TABLET | ORAL | Status: DC | PRN
Start: 2019-03-13 — End: 2019-03-16

## 2019-03-13 MED ORDER — NALOXONE HCL 0.4 MG/ML IJ SOLN (WRAP)
0.40 mg | INTRAMUSCULAR | Status: DC | PRN
Start: 2019-03-13 — End: 2019-03-16

## 2019-03-13 MED ORDER — AMLODIPINE BESYLATE 5 MG PO TABS
5.0000 mg | ORAL_TABLET | Freq: Every day | ORAL | Status: DC
Start: 2019-03-13 — End: 2019-03-14
  Administered 2019-03-13 – 2019-03-14 (×2): 5 mg via ORAL
  Filled 2019-03-13 (×2): qty 1

## 2019-03-13 MED ORDER — ESCITALOPRAM OXALATE 10 MG PO TABS
5.0000 mg | ORAL_TABLET | Freq: Every day | ORAL | Status: DC
Start: 2019-03-14 — End: 2019-03-16
  Administered 2019-03-14 – 2019-03-16 (×3): 5 mg via ORAL
  Filled 2019-03-13 (×3): qty 1

## 2019-03-13 MED ORDER — ASPIRIN EC 81 MG PO TBEC
81.00 mg | DELAYED_RELEASE_TABLET | Freq: Every morning | ORAL | Status: DC
Start: 2019-03-14 — End: 2019-03-16
  Administered 2019-03-14 – 2019-03-16 (×3): 81 mg via ORAL
  Filled 2019-03-13 (×3): qty 1

## 2019-03-13 MED ORDER — PRAVASTATIN SODIUM 40 MG PO TABS
40.0000 mg | ORAL_TABLET | Freq: Every morning | ORAL | Status: DC
Start: 2019-03-13 — End: 2019-03-16
  Administered 2019-03-13 – 2019-03-16 (×4): 40 mg via ORAL
  Filled 2019-03-13 (×4): qty 1

## 2019-03-13 MED ORDER — BISACODYL 10 MG RE SUPP
10.00 mg | RECTAL | Status: DC | PRN
Start: 2019-03-15 — End: 2019-03-16

## 2019-03-13 MED ORDER — FUROSEMIDE 10 MG/ML IJ SOLN
100.00 mg | Freq: Once | INTRAMUSCULAR | Status: AC
Start: 2019-03-13 — End: 2019-03-13
  Administered 2019-03-13: 13:00:00 100 mg via INTRAVENOUS

## 2019-03-13 MED ORDER — LEVOTHYROXINE SODIUM 50 MCG PO TABS
50.0000 ug | ORAL_TABLET | Freq: Every day | ORAL | Status: DC
Start: 2019-03-14 — End: 2019-03-14
  Administered 2019-03-14: 06:00:00 50 ug via ORAL
  Filled 2019-03-13 (×3): qty 1

## 2019-03-13 MED ORDER — FOLIC ACID 1 MG PO TABS
1.0000 mg | ORAL_TABLET | Freq: Every day | ORAL | Status: DC
Start: 2019-03-14 — End: 2019-03-16
  Administered 2019-03-14 – 2019-03-16 (×3): 1 mg via ORAL
  Filled 2019-03-13 (×3): qty 1

## 2019-03-13 MED ORDER — ACETAMINOPHEN 650 MG RE SUPP
650.00 mg | RECTAL | Status: DC | PRN
Start: 2019-03-13 — End: 2019-03-16

## 2019-03-13 MED ORDER — SODIUM CHLORIDE (PF) 0.9 % IJ SOLN
3.00 mL | Freq: Three times a day (TID) | INTRAMUSCULAR | Status: DC
Start: 2019-03-13 — End: 2019-03-16
  Administered 2019-03-13 – 2019-03-16 (×8): 3 mL via INTRAVENOUS

## 2019-03-13 MED ORDER — FUROSEMIDE 10 MG/ML IJ SOLN
INTRAMUSCULAR | Status: AC
Start: 2019-03-13 — End: ?
  Filled 2019-03-13: qty 10

## 2019-03-13 MED ORDER — LACTULOSE 10 GM/15ML PO SOLN
10.00 g | Freq: Every day | ORAL | Status: DC | PRN
Start: 2019-03-16 — End: 2019-03-16

## 2019-03-13 NOTE — Plan of Care (Signed)
Problem: Moderate/High Fall Risk Score >5  Goal: Patient will remain free of falls  Outcome: Progressing

## 2019-03-13 NOTE — H&P (Signed)
Medicine History & Physical   [facility], [place]   Patient Name: Kelli Brown: 0 days   Attending Physician: Derrill Kay, MD PCP: Flossie Dibble, MD      Assessment and Plan:                                                              Acute on chronic diastolic heart failure/ Moderate aortic stenosis  Continue Lasix 40 mg IV twice daily  Strict I's/O, daily weights    Hypoxia due to CHF/ ILD/ pulmonary hypertension  Continue oxygen by nasal cannula    AKI on CKD stage III likely cardiorenal  Stable  Monitor creatinine while on lasix    Mild anemia due to CKD  Monitor h/H    Paroxysmal atrial fibrillation  Not on any rate controlling agents  Continue warfarin. Daily PT/ INR  Pharmacy to dose  Amiodarone and metoprolol discontinued in last admission    Hypertension uncontrolled  Was on metoprolol but it was discontinued  Will add amlodipine    Hypothyroidism  Continue levothyroxine  Check TSH    Hyperlipidemia  Continue statin    DVT PPx: Anticoagulated   Dispo: Inpatient  Healthcare Proxy: grand daughter - Kelli Brown  Code: do not resuscitate     History of Presenting Illness                                CC: shortness of breath  Kelli Brown is a 85 y.o. female patient admitted for the complaints of shortness of breath and leg swelling since yesterday. Apparently, patient was found to be hypoxic at the [facility] and hence she was brought in for further evaluation. She was recently seen in the ER for similar issue. She denies any chest pain, dizziness, palpitations, fever, cough, chills, nausea, vomiting, abdominal pain, lack of appetite. C/o leg swelling  Past Medical History:   Diagnosis Date    Abnormal vision     Arthritis     Atrial fibrillation     Congestive heart failure     Glaucoma     Hip fx, right, closed, initial encounter 2010    Hyperlipidemia     Hypertension     Low back pain     Macular degeneration     Nonrheumatic  aortic (valve) stenosis 08/11/2017    Shingles      Past Surgical History:   Procedure Laterality Date    APPENDECTOMY      EYE SURGERY      cateracts    HIP SURGERY Bilateral     Tubes tied    TONSILLECTOMY      TUBAL LIGATION         Family History   Problem Relation Age of Onset    Stroke Mother     Hypertension Mother     Coronary artery disease Mother     Heart disease Mother     Cancer Father     Stroke Sister     CABG Brother      Social History     Tobacco Use    Smoking status: Never Smoker    Smokeless tobacco: Never Used  Substance Use Topics    Alcohol use: No    Drug use: No       Subjective   Review of Systems:  All systems were reviewed and are negative unless pertinent positive stated in HPI.  CONSTITUTIONAL: No night sweats. No fatigue. No fever or chills.   Eyes: No visual changes. No eye pain.   ENT: No runny nose. No epistaxis.   RESPIRATORY: No cough. No hemoptysis. shortness of breath.   CARDIOVASCULAR: No chest pains. No palpitations.   GASTROINTESTINAL: No abdominal pain. No vomiting. No diarrhea or constipation.   GENITOURINARY: No urgency. No frequency. No dysuria.   MUSCULOSKELETAL: No musculoskeletal pain. No joint swelling.   NEUROLOGICAL: No headache or neck pain. No syncope or seizure.   PSYCHIATRIC: No depression, no psychosis.  SKIN: No rashes. No lesions. No petechiae.  ENDOCRINE: No unexplained weight loss. No polydipsia.   HEMATOLOGIC: No anemia. No purpura. No bleeding.   ALLERGIC AND IMMUNOLOGIC: No pruritus. No swelling.         Objective   Physical Exam:     Vitals: T:98.7 F (37.1 C) (Oral),  BP:138/58, HR:88, RR:19, SaO2:99%    1) General Appearance: Alert and oriented x 4. In no acute distress.   2) Eyes: Pink conjunctiva, anicteric sclera. Pupils are equally reactive to light.  3) ENT: Oral mucosa moist with no pharyngeal congestion, erythema or swelling.  4) Neck: Supple, with full range of motion. Trachea is central, no JVD noted  5) Chest: diminished  breath sounds to auscultation bilaterally, no wheezes or rhonchi.  6) CVS: normal rate and regular rhythm, with no murmurs.  7) Abdomen: Soft, non-tender, no palpable mass. Bowel sounds normal. No CVA tenderness  8) Extremities: 1-2 + pitting leg edema, pulses palpable, no calf swelling and no gross deformity.  9) Skin: Warm, dry with normal skin turgor, no rash  10) Lymphatics: No lymphadenopathy in axillary, cervical and inguinal area.   11) Neurological: Cranial nerves II-XII intact. No gross focal motor or sensory deficits noted.  12) Psychiatric: Affect is appropriate. No hallucinations.    EKG: Per my review shows sinus rhythm, no acute ST-T wave changes  Chest Xray: Per my review shows small right pleural effusion, interstitial markings    Patient Vitals for the past 12 hrs:   BP Temp Pulse Resp   03/13/19 1400 138/58  88 19   03/13/19 1330 (!) 145/97  95 15   03/13/19 1303 130/61 98.7 F (37.1 C) 86 13     Weight Monitoring 01/14/2019 01/15/2019 01/16/2019 01/17/2019 01/18/2019 03/08/2019 03/13/2019   Height - - - - - 152.4 cm -   Height Method - - - - - Stated -   Weight 63.458 kg 62.914 kg 63.186 kg 63.141 kg 65.091 kg 62.596 kg 66 kg   Weight Method Standing Scale Standing Scale Standing Scale Standing Scale Standing Scale Stated -   BMI (calculated) - - - - - 27 kg/m2 -          Recent Results (from the past 24 hour(s))   ECG 12 lead    Collection Time: 03/13/19 12:57 PM   Result Value Ref Range    Patient Age 42 years    Patient DOB Sep 18, 1919     Patient Height      Patient Weight      Interpretation Text       Sinus rhythm  Minimal ST depression, lateral leads  Baseline wander in lead(s) V5  Compared to  ECG 03/08/2019 10:18:02  ST (T wave) deviation now present      Physician Interpreter      Ventricular Rate 90 //min    QRS Duration 83 ms    P-R Interval 152 ms    Q-T Interval 368 ms    Q-T Interval(Corrected) 451 ms    P Wave Axis 47 deg    QRS Axis 16 deg    T Axis 36 years   Comprehensive metabolic  panel    Collection Time: 03/13/19  1:08 PM   Result Value Ref Range    Sodium 142 136 - 147 mMol/L    Potassium 3.8 3.5 - 5.3 mMol/L    Chloride 104 98 - 110 mMol/L    CO2 27 20 - 30 mMol/L    Calcium 9.2 8.5 - 10.5 mg/dL    Glucose 90 71 - 99 mg/dL    Creatinine 5.40 (H) 0.60 - 1.20 mg/dL    BUN 20 7 - 22 mg/dL    Protein, Total 6.2 6.0 - 8.3 gm/dL    Albumin 3.5 3.5 - 5.0 gm/dL    Alkaline Phosphatase 73 40 - 145 U/L    ALT 12 0 - 55 U/L    AST (SGOT) 20 10 - 42 U/L    Bilirubin, Total 0.9 0.1 - 1.2 mg/dL    Albumin/Globulin Ratio 1.30 0.80 - 2.00 Ratio    Anion Gap 14.8 7.0 - 18.0 mMol/L    BUN/Creatinine Ratio 13.6 10.0 - 30.0 Ratio    EGFR 30 (L) 60 - 150 mL/min/1.59m2    Osmolality Calculated 285 275 - 300 mOsm/kg    Globulin 2.7 2.0 - 4.0 gm/dL   CBC and differential    Collection Time: 03/13/19  1:08 PM   Result Value Ref Range    WBC 7.0 4.0 - 11.0 K/cmm    RBC 3.05 (L) 3.80 - 5.00 M/cmm    Hemoglobin 9.8 (L) 12.0 - 16.0 gm/dL    Hematocrit 98.1 (L) 36.0 - 48.0 %    MCV 102 (H) 80 - 100 fL    MCH 32 28 - 35 pg    MCHC 32 32 - 36 gm/dL    RDW 19.1 (H) 47.8 - 14.0 %    PLT CT 267 130 - 440 K/cmm    MPV 6.9 6.0 - 10.0 fL    NEUTROPHIL % 49.1 42.0 - 78.0 %    Lymphocytes 32.5 15.0 - 46.0 %    Monocytes 11.3 3.0 - 15.0 %    Eosinophils % 6.8 0.0 - 7.0 %    Basophils % 0.4 0.0 - 3.0 %    Neutrophils Absolute 3.4 1.7 - 8.6 K/cmm    Lymphocytes Absolute 2.3 0.6 - 5.1 K/cmm    Monocytes Absolute 0.8 0.1 - 1.7 K/cmm    Eosinophils Absolute 0.5 0.0 - 0.8 K/cmm    BASO Absolute 0.0 0.0 - 0.3 K/cmm   B-type Natriuretic Peptide    Collection Time: 03/13/19  1:08 PM   Result Value Ref Range    B-Natriuretic Peptide 205.8 (H) 0.0 - 100.0 pg/mL   Troponin I    Collection Time: 03/13/19  1:08 PM   Result Value Ref Range    Troponin I 0.01 0.00 - 0.02 ng/mL   C Reactive Protein    Collection Time: 03/13/19  1:08 PM   Result Value Ref Range    C-Reactive Protein 0.88 (H) 0.02 - 0.80 mg/dL   Prothrombin time/INR    Collection  Time: 03/13/19  1:08 PM   Result Value Ref Range    PT 16.8 (H) 9.5 - 11.5 sec    PT INR 1.6 (H) 0.5 - 1.3        Allergies   Allergen Reactions    Codeine Nausea And Vomiting and Other (See Comments)     LIGHTHEADEDNESS.    Darvon [Propoxyphene] Nausea And Vomiting and Other (See Comments)     LIGHTHEADEDNESS.    Phenobarbital Swelling and Rash      Xr Chest 2 Views    Result Date: 03/08/2019  Findings consistent with mild congestive failure or fluid overload. ReadingStation:[id number]    Xr Chest Ap Portable    Result Date: 03/13/2019  No significant change from 5 days prior. Persistent small right effusion and scattered interstitial markings most likely fluid overload. ReadingStation:[email or web address]    US Venous Leg Bilateral    Result Date: 03/08/2019  No evidence of deep vein thrombosis either leg. ReadingStation:[email or web address]     Home Medications     Med List Status:  In Progress Set By: Lauree Chandler, RN at 03/13/2019  1:17 PM                aspirin EC 81 MG EC tablet     Take 81 mg by mouth every morning.         calcium carbonate 1500 (600 Ca) MG Tab tablet     Take 1,500 mg by mouth every morning     dorzolamide (TRUSOPT) 2 % ophthalmic solution     Place 1 drop into the left eye 3 (three) times daily.        escitalopram (LEXAPRO) 5 MG tablet     Take 5 mg by mouth daily     folic acid (FOLVITE) 1 MG tablet     Take 1 tablet (1 mg total) by mouth daily     furosemide (LASIX) 80 MG tablet     Take 1 tablet (80 mg total) by mouth daily for 14 days     levothyroxine (SYNTHROID, LEVOTHROID) 50 MCG tablet     Once a day at 6:00am        Multiple Vitamins-Minerals (CENTRUM WOMEN PO)     Take 1 tablet by mouth every morning.     potassium chloride (K-DUR,KLOR-CON) 20 MEQ tablet     Take 20 mEq by mouth every morning        pravastatin (PRAVACHOL) 40 MG tablet     Take 40 mg by mouth every morning        vitamin D (CHOLECALCIFEROL) 25 MCG (1000 UT) tablet     Take 1,000 Units by mouth every morning     warfarin  (COUMADIN) 3 MG tablet     daily           Ongoing Comment    Stottlemyer, Minerva Areola, PharmD    12/31/2018  1:21 PM    12/31/2018 - Resident of [facility] in [place], [place]. Per [employer] staff, med lists are not updated if pt self-administers medications. Ms. Tarrant self administers medications and therefore warfarin and amiodarone are not on her [facility] list. May wish to follow-up with Dr. Hal Hope office during normal business hours         Meds given in the ED:  Medications   furosemide (LASIX) injection 100 mg (100 mg Intravenous Given 03/13/19 1319)      Time Spent:     Eben Burow, MD     03/13/19,4:05  PM   MRN: [id number]                                      CSN: [id number] DOB: [birth year]

## 2019-03-13 NOTE — ED Notes (Addendum)
Pt is A&O x4. Pt is breathing easy on 2.5NC. Pt arrived with CC of SOB. Last medication given 1319 100mg  Lasix. Pt has a PrimaFit female catheter in place. So far pt has had 1600cc output. Pt belongings are at bedside.

## 2019-03-13 NOTE — ED Notes (Signed)
Bed: N9-A  Expected date:   Expected time:   Means of arrival:   Comments:  EMS

## 2019-03-13 NOTE — ED Provider Notes (Signed)
[facility]  Emergency Department       Patient Name: Kelli Brown, Kelli Brown Patient DOB:  [birth year]   Encounter Date:  03/13/2019 Age: 85 y.o. female   Attending ED Physician: Derrill Kay, MD MRN:  [id number]   Treating Provider:  Arlice Colt, PA-C PCP: Flossie Dibble, MD      History of Presenting Illness:     HPI/ROS is limited by: none  HPI/ROS given by: patient and EMS    Chief Complaint:Shortness of Breath and Leg Swelling     Kelli Brown is a 85 y.o. female who presents with shortness breath and leg swelling since this morning.  She was sent from assisted living where they noted she had increased peripheral edema and a low pulse oximetry today.  She is not on home oxygen.  States on arrival to the ED that she has no shortness of breath.  She denies any chest pain.  Her only complaint during my interview is that she feels she has more leg swelling than usual.        Review of Systems    Review of Systems   Constitutional: Negative.  Negative for chills and fever.   HENT: Negative.    Eyes: Negative.  Negative for blurred vision.   Respiratory: Positive for cough and shortness of breath.    Cardiovascular: Positive for leg swelling. Negative for chest pain.   Gastrointestinal: Negative.  Negative for abdominal pain.   Genitourinary: Negative.    Musculoskeletal: Negative.  Negative for joint pain and myalgias.   Skin: Negative.  Negative for rash.   Neurological: Negative.  Negative for dizziness, sensory change, focal weakness and headaches.   Endo/Heme/Allergies: Negative.    Psychiatric/Behavioral: Negative.    All other systems reviewed and are negative.      All other systems reviewed and are negative except as above. Please refer to HPI for other pertinent findings.          Allergies / Medications:   I personally reviewed Allergies    Pt is allergic to codeine; darvon [propoxyphene]; and phenobarbital.    Current Discharge Medication List      CONTINUE these medications which  have NOT CHANGED    Details   aspirin EC 81 MG EC tablet Take 81 mg by mouth every morning.          calcium carbonate 1500 (600 Ca) MG Tab tablet Take 1,500 mg by mouth every morning      dorzolamide (TRUSOPT) 2 % ophthalmic solution Place 1 drop into the left eye 3 (three) times daily.         escitalopram (LEXAPRO) 5 MG tablet Take 5 mg by mouth daily      folic acid (FOLVITE) 1 MG tablet Take 1 tablet (1 mg total) by mouth daily  Qty: 90 tablet, Refills: 3      furosemide (LASIX) 80 MG tablet Take 1 tablet (80 mg total) by mouth daily for 14 days  Qty: 14 tablet, Refills: 0      levothyroxine (SYNTHROID, LEVOTHROID) 50 MCG tablet Once a day at 6:00am         Multiple Vitamins-Minerals (CENTRUM WOMEN PO) Take 1 tablet by mouth every morning.      potassium chloride (K-DUR,KLOR-CON) 20 MEQ tablet Take 20 mEq by mouth every morning         pravastatin (PRAVACHOL) 40 MG tablet Take 40 mg by mouth every morning  vitamin D (CHOLECALCIFEROL) 25 MCG (1000 UT) tablet Take 1,000 Units by mouth every morning      warfarin (COUMADIN) 3 MG tablet daily                    Past History:   I personally reviewed past history    Medical:   Past Medical History:   Diagnosis Date   . Abnormal vision    . Arthritis    . Atrial fibrillation    . Congestive heart failure    . Glaucoma    . Hip fx, right, closed, initial encounter 2010   . Hyperlipidemia    . Hypertension    . Low back pain    . Macular degeneration    . Nonrheumatic aortic (valve) stenosis 08/11/2017   . Shingles        Surgical:   Past Surgical History:   Procedure Laterality Date   . APPENDECTOMY     . EYE SURGERY      cateracts   . HIP SURGERY Bilateral     Tubes tied   . TONSILLECTOMY     . TUBAL LIGATION         Family:   Family History   Problem Relation Age of Onset   . Stroke Mother    . Hypertension Mother    . Coronary artery disease Mother    . Heart disease Mother    . Cancer Father    . Stroke Sister    . CABG Brother        Social:  reports that  she has never smoked. She has never used smokeless tobacco. She reports that she does not drink alcohol or use drugs.        Physical Exam:   Vitals:    03/13/19 1958   BP:    Pulse:    Resp: 18   Temp:    SpO2:             Constitutional: Well appearing, NAD.  Head: Normocephalic, atraumatic  Eyes: Conjunctiva and sclera are normal.  No injection or discharge.  Ears, Nose, Throat:  Normal external examination of the nose and ears.  Oropharynx clear.  Neck: Normal range of motion. Non-tender. Trachea midline.  Respiratory/Chest: No respiratory distress. Bibasilar rales  Cardiac: regular rate and rhythm, 4/6 SEM at RUSB  Abdomen: Soft, non-distended and nontender  Back:  No paraspinous tenderness, no CVAT  Extremities: No evidence of injury,positive pulses with good capillary refill. 2+ BLE edema  Neurological: No focal motor deficits. Speech normal. CN II-XII grossly normal.  Skin: Warm and dry. No rash.  Psychiatric: Alert and conversant.  Normal affect.  Normal insight.        Diagnostic Results:   The results of the diagnostic studies have been reviewed by myself:    Radiologic Studies  Xr Chest 2 Views    Result Date: 03/08/2019  Findings consistent with mild congestive failure or fluid overload. ReadingStation:[email or web address]    Xr Chest Ap Portable    Result Date: 03/13/2019  No significant change from 5 days prior. Persistent small right effusion and scattered interstitial markings most likely fluid overload. ReadingStation:[email or web address]    US Venous Leg Bilateral    Result Date: 03/08/2019  No evidence of deep vein thrombosis either leg. ReadingStation:[email or web address]      Lab Studies  Results     Procedure Component Value Units Date/Time    B-type Natriuretic Peptide [[id number]]  (Abnormal) Collected:  03/13/19  1308    Specimen:  Blood Updated:  03/13/19 1356     B-Natriuretic Peptide 205.8 pg/mL     CBC and differential [[id number]]  (Abnormal) Collected:  03/13/19 1308    Specimen:  Blood Updated:  03/13/19 1352     WBC 7.0  K/cmm      RBC 3.05 M/cmm      Hemoglobin 9.8 gm/dL      Hematocrit 29.5 %      MCV 102 fL      MCH 32 pg      MCHC 32 gm/dL      RDW 62.1 %      PLT CT 267 K/cmm      MPV 6.9 fL      NEUTROPHIL % 49.1 %      Lymphocytes 32.5 %      Monocytes 11.3 %      Eosinophils % 6.8 %      Basophils % 0.4 %      Neutrophils Absolute 3.4 K/cmm      Lymphocytes Absolute 2.3 K/cmm      Monocytes Absolute 0.8 K/cmm      Eosinophils Absolute 0.5 K/cmm      BASO Absolute 0.0 K/cmm     Troponin I [[id number]] Collected:  03/13/19 1308    Specimen:  Plasma Updated:  03/13/19 1351     Troponin I 0.01 ng/mL     C Reactive Protein [[id number]]  (Abnormal) Collected:  03/13/19 1308    Specimen:  Plasma Updated:  03/13/19 1351     C-Reactive Protein 0.88 mg/dL     Comprehensive metabolic panel [[id number]]  (Abnormal) Collected:  03/13/19 1308    Specimen:  Plasma Updated:  03/13/19 1350     Sodium 142 mMol/L      Potassium 3.8 mMol/L      Chloride 104 mMol/L      CO2 27 mMol/L      Calcium 9.2 mg/dL      Glucose 90 mg/dL      Creatinine 2.53 mg/dL      BUN 20 mg/dL      Protein, Total 6.2 gm/dL      Albumin 3.5 gm/dL      Alkaline Phosphatase 73 U/L      ALT 12 U/L      AST (SGOT) 20 U/L      Bilirubin, Total 0.9 mg/dL      Albumin/Globulin Ratio 1.30 Ratio      Anion Gap 14.8 mMol/L      BUN/Creatinine Ratio 13.6 Ratio      EGFR 30 mL/min/1.68m2      Osmolality Calculated 285 mOsm/kg      Globulin 2.7 gm/dL     Prothrombin time/INR [[id number]]  (Abnormal) Collected:  03/13/19 1308    Specimen:  Blood Updated:  03/13/19 1343     PT 16.8 sec      PT INR 1.6                Procedures / Critical Care / EKG:     EKG Results     Procedure Component Value Units Date/Time    ECG 12 lead [[id number]] Collected:  03/13/19 1257     Updated:  03/13/19 1359     Patient Age 75 years      Patient DOB 1919-07-15     Patient Height --     Patient Weight --     Interpretation Text --     Sinus rhythm  Minimal ST depression, lateral leads  Baseline wander in  lead(s) V5  Compared to ECG 03/08/2019 10:18:02  ST (T wave) deviation now present       Physician Interpreter --     Ventricular Rate 90 //min      QRS Duration 83 ms      P-R Interval 152 ms      Q-T Interval 368 ms      Q-T Interval(Corrected) 451 ms      P Wave Axis 47 deg      QRS Axis 16 deg      T Axis 36 years                  ED Course / MDM:     ED Course as of Mar 12 2112   Mon Mar 13, 2019   1452 Discussed with Dr. [name], medicine    [BM]      ED Course User Index  [BM] Lynnae Prude, PA         The differential diagnosis includes, but is not limited to asthma, COPD, bronchospasm, bronchitis, hyperventilation, allergic reaction, myocardial infarction, acute respiratory failure, CHF/pulmonary edema, pericardial effusion, tamponade, pulmonary embolus, pneumothorax, pneumonia, diabetic ketoacidosis    Patient with worsening hypoxia and apparent CHF exacerbation.  Lasix initiated in the ED and consult to medical service for further management.    Patient was discussed with ED attending physician.              Nursing notes and prior records reviewed.        Diagnosis / Disposition:   Final Impression  1. Acute on chronic diastolic heart failure    2. Hypoxia    3. Chronic kidney disease, stage III (moderate)    4. Nonrheumatic aortic (valve) stenosis        Disposition  ED Disposition     ED Disposition Condition Date/Time Comment    Admit  Mon Mar 13, 2019  2:47 PM           Follow up  No follow-up provider specified.    Prescriptions  Current Discharge Medication List                This note was generated by the Epic EMR system/ Dragon speech recognition and may contain inherent errors or omissions not intended by the user. Grammatical errors, random word insertions, deletions, pronoun errors and incomplete sentences are occasional consequences of this technology due to software limitations. Not all errors are caught or corrected. If there are questions or concerns about the content of this note or  information contained within the body of this dictation they should be addressed directly with the author for clarification            Lynnae Prude, PA  03/13/19 2114       Derrill Kay, MD  03/14/19 [phone]

## 2019-03-13 NOTE — Progress Notes (Signed)
Pharmacy Consult Warfarin Dosing  Kelli Brown    Age: 85 y.o.  Weight: 64.1 kg (141 lb 5 oz)  Indication: a fib  Goal INR: 2-3  Home warfarin dose = 3 mg  Interacting agent/disease:     Subjective/Objective:   Kelli Brown is a 85 y.o. female patient admitted for the complaints of shortness of breath and leg swelling since yesterday. Apparently, patient was found to be hypoxic at the [facility] and hence she was brought in for further evaluation. She was recently seen in the ER for similar issue.    Assessment/Plan:  . INR = 1.6  . Warfarin 3 mg today  . Daily INR  . Monitor for signs and symptoms of bleeding  . Avoid IM injections    Past Medical History:   Diagnosis Date   . Abnormal vision    . Arthritis    . Atrial fibrillation    . Congestive heart failure    . Glaucoma    . Hip fx, right, closed, initial encounter 2010   . Hyperlipidemia    . Hypertension    . Low back pain    . Macular degeneration    . Nonrheumatic aortic (valve) stenosis 08/11/2017   . Shingles         Recent Labs   Lab 03/13/19  1308   Bilirubin, Total 0.9   Protein, Total 6.2   Albumin 3.5   ALT 12   AST (SGOT) 20         Current warfarin dose =  (hold for INR greater than 3.5)  Date 4/20           INR 1.6           dose            Hgb 9.8           Plt 267             If you have any questions, please contact the pharmacist at [phone].    Kelli Brown, PharmD

## 2019-03-13 NOTE — Plan of Care (Signed)
Assumed care of patient at 1900, will keep feet elevated and give medications as prescribed, urine output currently being monitored.    Problem: Moderate/High Fall Risk Score >5  Goal: Patient will remain free of falls  Outcome: Progressing  Flowsheets (Taken 03/13/2019 1947)  VH Moderate Risk (6-13): YELLOW NON-SKID SLIPPERS;INITIATE YELLOW "FALL RISK" SIGNAGE;Remain with patient during toileting     Problem: Compromised Hemodynamic Status  Goal: Vital signs and fluid balance maintained/improved  Outcome: Progressing  Flowsheets (Taken 03/13/2019 2250)  Vital signs and fluid balance are maintained/improved: Position patient for maximum circulation/cardiac output; Monitor and compare daily weight; Monitor/assess vitals and hemodynamic parameters with position changes; Monitor/assess lab values and report abnormal values

## 2019-03-14 LAB — CBC AND DIFFERENTIAL
Basophils %: 0.4 % (ref 0.0–3.0)
Basophils Absolute: 0 10*3/uL (ref 0.0–0.3)
Eosinophils %: 7.9 % — ABNORMAL HIGH (ref 0.0–7.0)
Eosinophils Absolute: 0.5 10*3/uL (ref 0.0–0.8)
Hematocrit: 27.1 % — ABNORMAL LOW (ref 36.0–48.0)
Hemoglobin: 8.6 gm/dL — ABNORMAL LOW (ref 12.0–16.0)
Lymphocytes Absolute: 2 10*3/uL (ref 0.6–5.1)
Lymphocytes: 29.5 % (ref 15.0–46.0)
MCH: 32 pg (ref 28–35)
MCHC: 32 gm/dL (ref 32–36)
MCV: 102 fL — ABNORMAL HIGH (ref 80–100)
MPV: 7.1 fL (ref 6.0–10.0)
Monocytes Absolute: 0.7 10*3/uL (ref 0.1–1.7)
Monocytes: 10.7 % (ref 3.0–15.0)
Neutrophils %: 51.5 % (ref 42.0–78.0)
Neutrophils Absolute: 3.5 10*3/uL (ref 1.7–8.6)
PLT CT: 241 10*3/uL (ref 130–440)
RBC: 2.67 10*6/uL — ABNORMAL LOW (ref 3.80–5.00)
RDW: 19.8 % — ABNORMAL HIGH (ref 11.0–14.0)
WBC: 6.7 10*3/uL (ref 4.0–11.0)

## 2019-03-14 LAB — HEPATIC FUNCTION PANEL
ALT: 9 U/L (ref 0–55)
AST (SGOT): 16 U/L (ref 10–42)
Albumin/Globulin Ratio: 1.12 Ratio (ref 0.80–2.00)
Albumin: 2.9 gm/dL — ABNORMAL LOW (ref 3.5–5.0)
Alkaline Phosphatase: 60 U/L (ref 40–145)
Bilirubin Direct: 0.3 mg/dL (ref 0.0–0.3)
Bilirubin, Total: 0.7 mg/dL (ref 0.1–1.2)
Globulin: 2.6 gm/dL (ref 2.0–4.0)
Protein, Total: 5.5 gm/dL — ABNORMAL LOW (ref 6.0–8.3)

## 2019-03-14 LAB — ECG 12-LEAD
P Wave Axis: 47 deg
P-R Interval: 152 ms
Patient Age: 89 years
Q-T Interval(Corrected): 451 ms
Q-T Interval: 368 ms
QRS Axis: 16 deg
QRS Duration: 83 ms
T Axis: 36 years
Ventricular Rate: 90 //min

## 2019-03-14 LAB — BASIC METABOLIC PANEL
Anion Gap: 14.3 mMol/L (ref 7.0–18.0)
BUN / Creatinine Ratio: 15.3 Ratio (ref 10.0–30.0)
BUN: 21 mg/dL (ref 7–22)
CO2: 29 mMol/L (ref 20–30)
Calcium: 8.9 mg/dL (ref 8.5–10.5)
Chloride: 101 mMol/L (ref 98–110)
Creatinine: 1.37 mg/dL — ABNORMAL HIGH (ref 0.60–1.20)
EGFR: 32 mL/min/{1.73_m2} — ABNORMAL LOW (ref 60–150)
Glucose: 84 mg/dL (ref 71–99)
Osmolality Calculated: 283 mOsm/kg (ref 275–300)
Potassium: 3.3 mMol/L — ABNORMAL LOW (ref 3.5–5.3)
Sodium: 141 mMol/L (ref 136–147)

## 2019-03-14 LAB — VH DEXTROSE STICK GLUCOSE: Glucose POCT: 129 mg/dL — ABNORMAL HIGH (ref 71–99)

## 2019-03-14 LAB — PHOSPHORUS: Phosphorus: 4.1 mg/dL (ref 2.3–4.7)

## 2019-03-14 LAB — MAGNESIUM: Magnesium: 1.9 mg/dL (ref 1.6–2.6)

## 2019-03-14 LAB — PT/INR
PT INR: 1.7 — ABNORMAL HIGH (ref 0.5–1.3)
PT: 17.9 s — ABNORMAL HIGH (ref 9.5–11.5)

## 2019-03-14 LAB — THYROID STIMULATING HORMONE (TSH), REFLEX ON ABNORMAL TO FREE T4, SERUM
T4 Free: 0.88 ng/dL (ref 0.70–1.48)
TSH: 8.25 u[IU]/mL — ABNORMAL HIGH (ref 0.40–4.20)

## 2019-03-14 MED ORDER — VH POTASSIUM CHLORIDE CRYS ER 20 MEQ PO TBCR (WRAP)
40.00 meq | EXTENDED_RELEASE_TABLET | Freq: Once | ORAL | Status: AC
Start: 2019-03-14 — End: 2019-03-14
  Administered 2019-03-14: 08:00:00 40 meq via ORAL

## 2019-03-14 MED ORDER — LEVOTHYROXINE SODIUM 75 MCG PO TABS
75.0000 ug | ORAL_TABLET | Freq: Every day | ORAL | Status: DC
Start: 2019-03-15 — End: 2019-03-16
  Administered 2019-03-15 – 2019-03-16 (×2): 75 ug via ORAL
  Filled 2019-03-14 (×3): qty 1

## 2019-03-14 MED ORDER — ALBUTEROL SULFATE (2.5 MG/3ML) 0.083% IN NEBU
2.50 mg | INHALATION_SOLUTION | Freq: Four times a day (QID) | RESPIRATORY_TRACT | Status: DC
Start: 2019-03-14 — End: 2019-03-14
  Filled 2019-03-14 (×3): qty 3

## 2019-03-14 MED ORDER — DORZOLAMIDE HCL 2 % OP SOLN
1.00 [drp] | Freq: Three times a day (TID) | OPHTHALMIC | Status: DC
Start: 2019-03-14 — End: 2019-03-16
  Administered 2019-03-14 – 2019-03-16 (×6): 1 [drp] via OPHTHALMIC
  Filled 2019-03-14: qty 200

## 2019-03-14 NOTE — Plan of Care (Signed)
Problem: Moderate/High Fall Risk Score >5  Goal: Patient will remain free of falls  Outcome: Progressing  Flowsheets (Taken 03/14/2019 1000)  VH Moderate Risk (6-13): ALL REQUIRED LOW INTERVENTIONS;INITIATE YELLOW "FALL RISK" SIGNAGE;YELLOW NON-SKID SLIPPERS;YELLOW "FALL RISK" ARM BAND;USE OF BED EXIT ALARM IF PATIENT IS CONFUSED OR IMPULSIVE. PLACE RESET BED ALARM SIGN ABOVE BED;PLACE FALL RISK LEVEL ON WHITE BOARD FOR COMMUNICATION PURPOSES IN PATIENT'S ROOM;Use assistive devices;Use of "STOP ask for help" sign     Problem: Compromised Hemodynamic Status  Goal: Vital signs and fluid balance maintained/improved  Outcome: Progressing  Flowsheets (Taken 03/13/2019 2250 by Alinda Sierras, RN)  Vital signs and fluid balance are maintained/improved: Position patient for maximum circulation/cardiac output;Monitor and compare daily weight;Monitor/assess vitals and hemodynamic parameters with position changes;Monitor/assess lab values and report abnormal values     Problem: Compromised Tissue integrity  Goal: Damaged tissue is healing and protected  Outcome: Progressing  Flowsheets (Taken 03/14/2019 1008)  Damaged tissue is healing and protected : Monitor/assess Braden scale every shift; Relieve pressure to bony prominences for patients at moderate and high risk; Avoid shearing injuries; Keep intact skin clean and dry; Use bath wipes, not soap and water, for daily bathing; Use incontinence wipes for cleaning urine, stool and caustic drainage. Foley care as needed; Monitor external devices/tubes for correct placement to prevent pressure, friction and shearing; Monitor patient's hygiene practices  Goal: Nutritional status is improving  Outcome: Progressing  Flowsheets (Taken 03/14/2019 1008)  Nutritional status is improving: Encourage patient to take dietary supplement(s) as ordered; Allow adequate time for meals; Assist patient with eating

## 2019-03-14 NOTE — Progress Notes (Signed)
Medicine Progress Note   [facility], [place]   Patient Name: [facility] WHITE LOS: 1 days   Attending Physician: Eben Burow, MD PCP: Flossie Dibble, MD      Hospital Course:                                                            Kelli Brown is a 85 y.o. female patient admitted for CHF exacerbation     Assessment and Plan:    Acute on chronic diastolic heart failure/ Moderate aortic stenosis  ContinueLasix 20 mg IV twice daily for one more day  Strict I's/O, daily weights    Hypoxia due to CHF/ ILD/ pulmonary hypertension  Continue oxygen by nasal cannula. Wean off as tolerated    AKI on CKD stage III likely cardiorenal  Improved  Monitor creatinine while on lasix    Hypokalemia  Replete    Mild anemia due to CKD  Monitor h/H    Paroxysmal atrial fibrillation  Not on any rate controlling agents  Continue warfarin. Daily PT/ INR  Pharmacy to dose  Amiodarone and metoprolol discontinued in last admission    Hypertension controlled  Not on any antihypertensives at home  BP dropped this morning after amlodipine last evening. Will discontinue amlodipine    Hypothyroidism  Increase levothyroxine to 75 mcg and follow up in  TSH- 8.25    Hyperlipidemia  Continue statin    Labs ordered.  Multiple prescription medication changes made.  Nursing orders changed.  All labs and studies have been reviewed.        Disposition: likely tomorrow  DVT PPX:  Anticoagulated  Code:  NO CPR - SUPPORT OK     Subjective   Feels a lot better. Leg swelling has improved.         Objective   Physical Exam:     Vitals: T:97.2 F (36.2 C) (Oral), BP:105/51, HR:94, RR:16, SaO2:97%    General: Patient is awake. In no acute distress.  HEENT: No conjunctival drainage, vision is intact, anicteric sclera.  Neck: Supple, no thyromegaly.  Chest: clear breath sounds bilaterally. No rhonchi, no wheezing. No use of accessory muscles.  CVS: Normal rate and regular rhythm no murmurs, without JVD, trace  pitting leg edema, pulses palpable.  Abdomen: Soft, non-tender, no guarding or rigidity, with normal bowel sounds.  Extremities: No calf swelling and no gross deformity.  Skin: Warm, dry, no rash and no worrisome lesions.  NEURO: No motor or sensory deficits.  Psychiatric: Alert, interactive, appropriate, normal affect.  Weight Monitoring 01/17/2019 01/18/2019 03/08/2019 03/13/2019 03/13/2019 03/13/2019 03/14/2019   Height - - 152.4 cm - - 152.4 cm -   Height Method - - Stated - - - -   Weight 63.141 kg 65.091 kg 62.596 kg 66 kg 64.1 kg - 64.32 kg   Weight Method Standing Scale Standing Scale Stated - Standing Scale - Standing Scale   BMI (calculated) - - 27 kg/m2 - - - -         Intake/Output Summary (Last 24 hours) at 03/14/2019 1452  Last data filed at 03/14/2019 1440  Gross per 24 hour   Intake 1040 ml   Output 1000 ml   Net 40 ml     Body mass index is  27.69 kg/m.     Meds:     Current Facility-Administered Medications   Medication Dose Route Frequency    amLODIPine  5 mg Oral Daily    aspirin EC  81 mg Oral QAM    escitalopram  5 mg Oral Daily    folic acid  1 mg Oral Daily    furosemide  20 mg Intravenous BID    [START ON 03/15/2019] levothyroxine  75 mcg Oral Daily at 0600    potassium chloride  20 mEq Oral QAM    pravastatin  40 mg Oral QAM    sodium chloride (PF)  3 mL Intravenous Q8H    warfarin  3 mg Oral Daily at 1800    warfarin therapy placeholder  1 each Does not apply See Admin Instructions       PRN Meds: acetaminophen **OR** acetaminophen **OR** acetaminophen, albuterol, [START ON 03/15/2019] bisacodyl, [START ON 03/16/2019] lactulose, naloxone, ondansetron **OR** ondansetron, polyethylene glycol, prochlorperazine.     LABS:     Estimated Creatinine Clearance: 19.6 mL/min (A) (based on SCr of 1.37 mg/dL (H)).  Recent Labs   Lab 03/14/19  0418 03/13/19  1308   WBC 6.7 7.0   RBC 2.67* 3.05*   Hemoglobin 8.6* 9.8*   Hematocrit 27.1* 31.0*   MCV 102* 102*   PLT CT 241 267     Recent Labs   Lab  03/14/19  0911 03/13/19  1308   PT 17.9* 16.8*   PT INR 1.7* 1.6*     Recent Labs   Lab 03/13/19  1308 03/08/19  1029   Troponin I 0.01 <0.01     No results found for: [id number]  Recent Labs   Lab 03/14/19  0418 03/13/19  1308 03/08/19  1029   Glucose 84 90 135*   Sodium 141 142 138   Potassium 3.3* 3.8 3.9   Chloride 101 104 101   CO2 29 27 21.4   BUN 21 20 16    Creatinine 1.37* 1.47* 1.24*   EGFR 32* 30* 36*   Calcium 8.9 9.2 9.5     Recent Labs   Lab 03/14/19  0418 03/13/19  1308   Magnesium 1.9  --    Phosphorus 4.1  --    Albumin 2.9* 3.5   Protein, Total 5.5* 6.2   Bilirubin, Total 0.7 0.9   Alkaline Phosphatase 60 73   ALT 9 12   AST (SGOT) 16 20           Invalid input(s):  AMORPHOUSUA   Patient Lines/Drains/Airways Status    Active PICC Line / CVC Line / PIV Line / Drain / Airway / Intraosseous Line / Epidural Line / ART Line / Line / Wound / Pressure Ulcer / NG/OG Tube     Name:   Placement date:   Placement time:   Site:   Days:    Peripheral IV 03/13/19 Right Antecubital   03/13/19    1314    Antecubital   1    External Urinary Catheter   03/13/19    1320    --   1               Xr Chest 2 Views    Result Date: 03/08/2019  Findings consistent with mild congestive failure or fluid overload. ReadingStation:[email or web address]    Xr Chest Ap Portable    Result Date: 03/13/2019  No significant change from 5 days prior. Persistent small right effusion and scattered interstitial markings most  likely fluid overload. ReadingStation:[email or web address]    US Venous Leg Bilateral    Result Date: 03/08/2019  No evidence of deep vein thrombosis either leg. ReadingStation:[email or web address]     Home Health Needs:  There are no questions and answers to display.       Nutrition assessment done in collaboration with Registered Dietitians:     Time spent: 35 minutes     Eben Burow, MD     03/14/19,2:52 PM   MRN: [id number]                                      CSN: [id number] DOB: [birth year]

## 2019-03-14 NOTE — Progress Notes (Signed)
Readmission Risk  [facility] - GI/ENDO/GEN MED   Patient Name: Kelli Brown   Attending Physician: Eben Burow, MD   Today's date:   03/14/2019 LOS: 1 days   Expected Discharge Date      Readmission Assessment:                                                              Discharge Planning  ReAdmit Risk Score: 27  Does the patient have perscription coverage?: Yes  Utilize [facility] [facility]: No([facility] [place])  Confirmed PCP with Pt: Yes  Confirmed PCP name: Dr. Kennedy Bucker  Last PCP Visit Date: 1 month  Confirm Transport to F/U Appt.: Self/Private Vehicle/Friend  Social Work Referral: Not Applicable  CM Comments: 04/21  RNCM/ MB:  Acute on chronic CHF, IV Lasix BID, 2LNC O2, RA at baseline.  Anticipated dispo to [facility].  HH/DME needs TBD.  Will need to determine d/c transportation when appropriate/ pt transport with brother vs friends.  RNCM following.        IDPA:   Patient Type  Within 30 Days of Previous Admission?: No  Healthcare Decisions  Interviewed:: Patient  Orientation/Decision Making Abilities of Patient: Alert and Oriented x3, able to make decisions  Prior to admission  Prior level of function: Independent with ADLs, Ambulates with assistive device  Type of Residence: Assisted living([address])  Home Layout: One level  Have running water, electricity, heat, etc?: Yes  Living Arrangements: Other (Comment)(ALF)  How do you get to your MD appointments?: Family/ ALF  How do you get your groceries?: ALF  Who fixes your meals?: Family/ALF  Who does your laundry?: ALF  Who picks up your prescriptions?: ALF  Dressing: Independent  Grooming: Independent  Feeding: Independent  Bathing: Needs assistance  Toileting: Independent  DME Currently at Home: Dan Humphreys, [employer] Care/Community Services: (ALF staff)  Discharge Planning  Support Systems: Family members(ALF staff)  Patient expects to be discharged to:: ALF vs SNF  Anticipated [place] plan discussed with:: Same as  interviewed  Mode of transportation:: Private car (family member)  Does the patient have perscription coverage?: Yes  Consults/Providers  PT Evaluation Needed: Yes (Comment)  OT Evalulation Needed: Yes (Comment)  SLP Evaluation Needed: No  PCP  PCP on file was verified as the current PCP?: Yes   30 Day Readmission:       Provider Notifications:        Kelli Clines, RN BSN  Nurse Case Manager  [facility]  [phone]

## 2019-03-14 NOTE — Progress Notes (Signed)
Pharmacy Consult Warfarin Dosing  Kelli Brown    Age: 85 y.o.  Weight: 64.3 kg (141 lb 12.8 oz)  Indication: a fib  Goal INR: 2-3  Home warfarin dose = 3 mg  Interacting agent/disease:     Subjective/Objective:   Kelli Brown is a 85 y.o. female patient admitted for the complaints of shortness of breath and leg swelling since yesterday. Apparently, patient was found to be hypoxic at the [facility] and hence she was brought in for further evaluation. She was recently seen in the ER for similar issue.    Assessment/Plan:  . INR = 1.7  . Warfarin 3 mg today  . Daily INR  . Monitor for signs and symptoms of bleeding  . Avoid IM injections    Past Medical History:   Diagnosis Date   . Abnormal vision    . Arthritis    . Atrial fibrillation    . Congestive heart failure    . Glaucoma    . Hip fx, right, closed, initial encounter 2010   . Hyperlipidemia    . Hypertension    . Low back pain    . Macular degeneration    . Nonrheumatic aortic (valve) stenosis 08/11/2017   . Shingles         Recent Labs   Lab 03/14/19  0418   Bilirubin, Total 0.7   Bilirubin, Direct 0.3   Protein, Total 5.5*   Albumin 2.9*   ALT 9   AST (SGOT) 16         Current warfarin dose =  (hold for INR greater than 3.5)  Date 4/20 4/21          INR 1.6 1.7          dose 3 mg           Hgb 9.8           Plt 267             If you have any questions, please contact the pharmacist at [phone].    [name], PharmD

## 2019-03-14 NOTE — UM Notes (Signed)
[facility] Utilization Management Review Sheet    Facility :  [facility]    NAME: Neosha Switalski Bodin  MR#: [id number]    CSN#: [id number]    ROOM: 504/504-A AGE: 85 y.o.    ADMIT DATE AND TIME: 03/13/2019 12:57 PM      PATIENT CLASS:    ATTENDING PHYSICIAN: Eben Burow, MD  PAYOR:Payor: MEDICARE / Plan: MEDICARE PART A AND B / Product Type: Medicare /       AUTH #:     DIAGNOSIS:     ICD-10-CM    1. Acute on chronic diastolic heart failure I50.33    2. Hypoxia R09.02    3. Chronic kidney disease, stage III (moderate) N18.3    4. Nonrheumatic aortic (valve) stenosis I35.0        HISTORY:   Past Medical History:   Diagnosis Date    Abnormal vision     Arthritis     Atrial fibrillation     Congestive heart failure     Glaucoma     Hip fx, right, closed, initial encounter 2010    Hyperlipidemia     Hypertension     Low back pain     Macular degeneration     Nonrheumatic aortic (valve) stenosis 08/11/2017    Shingles        DATE OF REVIEW: 03/14/2019    VITALS: BP 114/61    Pulse 90    Temp 97.9 F (36.6 C) (Oral)    Resp 14    Ht 1.524 m (5')    Wt 64.3 kg (141 lb 12.8 oz)    SpO2 93%    BMI 27.69 kg/m     Active Hospital Problems    Diagnosis    Acute on chronic diastolic heart failure     ED  Lasix 100mg  IV x1,     Admission Review  History   85 y.o. female patient admitted for the complaints of shortness of breath and leg swelling since yesterday. Apparently, patient was found to be hypoxic at the [facility] and hence she was brought in for further evaluation  VS  98.7, 86, 88%, 13, 130/61  Labs  H/h 9.8, 31.0, creat 1.47, BNP 205.8,   Imagining   Chest xray  IMPRESSION:   No significant change from 5 days prior.  Persistent small right effusion and scattered interstitial markings most likely fluid overload.  Assessment/Plan  Acute on chronic diastolic heart failure/ Moderate aortic stenosis  ContinueLasix 40 mg IV twice daily  Strict I's/O, daily weights    Hypoxia due to CHF/ ILD/  pulmonary hypertension  Continue oxygen by nasal cannula    AKI on CKD stage III likely cardiorenal  Stable  Monitor creatinine while on lasix    Mild anemia due to CKD  Monitor h/H    Paroxysmal atrial fibrillation  Not on any rate controlling agents  Continue warfarin. Daily PT/ INR  Pharmacy to dose  Amiodarone and metoprolol discontinued in last admission    Hypertension uncontrolled  Was on metoprolol but it was discontinued  Will add amlodipine    Hypothyroidism  Continue levothyroxine  Check TSH    Hyperlipidemia  Continue statin    DVT PPx: Anticoagulated   Dispo: Inpatient  Admission Orders  Lasix 20mg  IV bid, albuterol neb x1, 2LO2,

## 2019-03-14 NOTE — Plan of Care (Addendum)
NURSE NOTE SUMMARY  [facility] - GI/ENDO/GEN MED   Patient Name: Kelli Brown   Attending Physician: Eben Burow, MD   Today's date:   03/14/2019 LOS: 1 days   Shift Summary:                                                              Assumed pt care at 1900. Pt AAOx4, pt denies pain and shows no s/s of distress. Pt resting comfortably in bed at this time, Call bell within reach, bed alarm on and audible, will continue to monitor.     2100- took pt off 1L NC, will recheck SPO2.    2215- Pt O2 78-88% on RA, reapplied 2L NC- pt up to 92%. O2 saturation tends to decrease as patient falls asleep.     34- removed external female cath, as it needed changed and patient is likely to be discharged today. Pt agreeable to cath removal. Pt ambulated to BR and voided in toliet     Provider Notifications:      Rapid Response Notifications:  Mobility:      PMP Activity: Step 6 - Walks in Room (03/14/2019  9:04 PM)     Weight tracking:  Family Dynamic:   Last 3 Weights for the past 72 hrs (Last 3 readings):   Weight   03/14/19 0357 64.3 kg (141 lb 12.8 oz)   03/13/19 1651 64.1 kg (141 lb 5 oz)   03/13/19 1303 66 kg (145 lb 8.1 oz)             Recent Vitals Last Bowel Movement   BP: 109/46 (03/14/2019  7:08 PM)  Heart Rate: 95 (03/14/2019  7:08 PM)  Temp: 97.9 F (36.6 C) (03/14/2019  7:08 PM)  Resp Rate: 20 (03/14/2019  7:08 PM)  Height: 1.524 m (5') (03/13/2019 10:25 PM)  Weight: 64.3 kg (141 lb 12.8 oz) (03/14/2019  3:57 AM)  SpO2: 91 % (03/14/2019  7:08 PM)   Last BM Date: 03/14/19         Problem: Moderate/High Fall Risk Score >5  Goal: Patient will remain free of falls  Outcome: Progressing  Flowsheets (Taken 03/14/2019 2104)  VH Moderate Risk (6-13): ALL REQUIRED LOW INTERVENTIONS;INITIATE YELLOW "FALL RISK" SIGNAGE;YELLOW NON-SKID SLIPPERS;YELLOW "FALL RISK" ARM BAND;USE OF BED EXIT ALARM IF PATIENT IS CONFUSED OR IMPULSIVE. PLACE RESET BED ALARM SIGN ABOVE BED;Include family/significant other in  multidisciplinary discussion regarding plan of care as appropriate;Remain with patient during toileting;Use assistive devices     Problem: Compromised Hemodynamic Status  Goal: Vital signs and fluid balance maintained/improved  Outcome: Progressing  Flowsheets (Taken 03/14/2019 2106)  Vital signs and fluid balance are maintained/improved: Position patient for maximum circulation/cardiac output; Monitor/assess vitals and hemodynamic parameters with position changes; Monitor and compare daily weight; Monitor/assess lab values and report abnormal values; Monitor intake and output. Notify LIP if urine output is less than 30 mL/hour.     Problem: Compromised Tissue integrity  Goal: Damaged tissue is healing and protected  Outcome: Progressing  Flowsheets (Taken 03/14/2019 2106)  Damaged tissue is healing and protected : Monitor/assess Braden scale every shift; Reposition patient every 2 hours and as needed unless able to reposition self; Increase activity as tolerated/progressive mobility; Relieve pressure to bony prominences for patients at moderate  and high risk; Avoid shearing injuries; Keep intact skin clean and dry; Use incontinence wipes for cleaning urine, stool and caustic drainage. Foley care as needed; Monitor external devices/tubes for correct placement to prevent pressure, friction and shearing; Encourage use of lotion/moisturizer on skin; Monitor patient's hygiene practices  Goal: Nutritional status is improving  Outcome: Progressing  Flowsheets (Taken 03/14/2019 2106)  Nutritional status is improving: Assist patient with eating; Allow adequate time for meals; Include patient/patient care companion in decisions related to nutrition

## 2019-03-14 NOTE — Progress Notes (Addendum)
NURSE NOTE SUMMARY  [facility] - GI/ENDO/GEN MED   Patient Name: Kelli [facility] WHITE   Attending Physician: Eben Burow, MD   Today's date:   03/14/2019 LOS: 1 days   Shift Summary:                                                              0700 assumed care of patient. Patientrestingin room. Respirations 18.Patient's bed in low and locked position. Call light within reach. Patient has no questions or concerns at this time. bed alarm on.     0830 patient resting in bed. medications administered.    Provider Notifications:      Rapid Response Notifications:  Mobility:      PMP Activity: Step 3 - Bed Mobility (03/13/2019  7:47 PM)     Weight tracking:  Family Dynamic:   Last 3 Weights for Kelli past 72 hrs (Last 3 readings):   Weight   03/14/19 0357 64.3 kg (141 lb 12.8 oz)   03/13/19 1651 64.1 kg (141 lb 5 oz)   03/13/19 1303 66 kg (145 lb 8.1 oz)             Recent Vitals Last Bowel Movement   BP: 96/44 (03/14/2019  3:11 AM)  Heart Rate: 97 (03/14/2019  3:11 AM)  Temp: 97.9 F (36.6 C) (03/14/2019  3:09 AM)  Resp Rate: 21 (03/14/2019  3:09 AM)  Height: 1.524 m (5') (03/13/2019 10:25 PM)  Weight: 64.3 kg (141 lb 12.8 oz) (03/14/2019  3:57 AM)  SpO2: 90 % (03/14/2019  3:09 AM)   Last BM Date: 03/12/19

## 2019-03-15 LAB — BASIC METABOLIC PANEL
Anion Gap: 14 mMol/L (ref 7.0–18.0)
BUN / Creatinine Ratio: 15.2 Ratio (ref 10.0–30.0)
BUN: 20 mg/dL (ref 7–22)
CO2: 29 mMol/L (ref 20–30)
Calcium: 8.9 mg/dL (ref 8.5–10.5)
Chloride: 102 mMol/L (ref 98–110)
Creatinine: 1.32 mg/dL — ABNORMAL HIGH (ref 0.60–1.20)
EGFR: 34 mL/min/{1.73_m2} — ABNORMAL LOW (ref 60–150)
Glucose: 95 mg/dL (ref 71–99)
Osmolality Calculated: 284 mOsm/kg (ref 275–300)
Potassium: 4 mMol/L (ref 3.5–5.3)
Sodium: 141 mMol/L (ref 136–147)

## 2019-03-15 LAB — CBC AND DIFFERENTIAL
Basophils %: 0.7 % (ref 0.0–3.0)
Basophils Absolute: 0.1 10*3/uL (ref 0.0–0.3)
Eosinophils %: 9.7 % — ABNORMAL HIGH (ref 0.0–7.0)
Eosinophils Absolute: 0.7 10*3/uL (ref 0.0–0.8)
Hematocrit: 29 % — ABNORMAL LOW (ref 36.0–48.0)
Hemoglobin: 9.1 gm/dL — ABNORMAL LOW (ref 12.0–16.0)
Lymphocytes Absolute: 2.1 10*3/uL (ref 0.6–5.1)
Lymphocytes: 29.7 % (ref 15.0–46.0)
MCH: 32 pg (ref 28–35)
MCHC: 31 gm/dL — ABNORMAL LOW (ref 32–36)
MCV: 103 fL — ABNORMAL HIGH (ref 80–100)
MPV: 6.9 fL (ref 6.0–10.0)
Monocytes Absolute: 0.8 10*3/uL (ref 0.1–1.7)
Monocytes: 11.9 % (ref 3.0–15.0)
Neutrophils %: 48 % (ref 42.0–78.0)
Neutrophils Absolute: 3.3 10*3/uL (ref 1.7–8.6)
PLT CT: 234 10*3/uL (ref 130–440)
RBC: 2.82 10*6/uL — ABNORMAL LOW (ref 3.80–5.00)
RDW: 19.6 % — ABNORMAL HIGH (ref 11.0–14.0)
WBC: 6.9 10*3/uL (ref 4.0–11.0)

## 2019-03-15 LAB — PT/INR
PT INR: 1.9 — ABNORMAL HIGH (ref 0.5–1.3)
PT: 19.3 s — ABNORMAL HIGH (ref 9.5–11.5)

## 2019-03-15 MED ORDER — FUROSEMIDE 40 MG PO TABS
40.0000 mg | ORAL_TABLET | Freq: Two times a day (BID) | ORAL | Status: DC
Start: 2019-03-15 — End: 2019-03-16
  Administered 2019-03-15 – 2019-03-16 (×2): 40 mg via ORAL
  Filled 2019-03-15 (×4): qty 1

## 2019-03-15 NOTE — Progress Notes (Signed)
Medicine Progress Note   [facility], [place]   Patient Name: [facility] WHITE LOS: 2 days   Attending Physician: Eben Burow, MD PCP: Flossie Dibble, MD      Hospital Course:                                                            Kelli Brown is a 85 y.o. female patient admitted for CHF exacerbation     Assessment and Plan:    Acute on chronic diastolic heart failure/ Moderate aortic stenosis  Continuelasix 40 mg po bid  Strict I's/O, daily weights    Hypoxia due to CHF/ ILD/ pulmonary hypertension  Arranged home O2  Drops sats to 70's especially at night    AKI on CKD stage III likely cardiorenal  Improving  Monitor creatinine while on lasix    Hypokalemia  Resolved    Mild anemia due to CKD  Monitor h/H    Paroxysmal atrial fibrillation  Not on any rate controlling agents  Continue warfarin. Daily PT/ INR  Pharmacy to dose  Amiodarone and metoprolol discontinued in last admission    Hypertension controlled  Not on any antihypertensives at home  BP dropped this morning after amlodipine last evening. Will discontinue amlodipine    Hypothyroidism  Increase levothyroxine to 75 mcg and follow up TSH in 3-4 weeks  TSH- 8.25    Hyperlipidemia  Continue statin      Multiple prescription medication changes made.   Nursing orders changed.  All labs and studies have been reviewed.    D/W RN, CM        Disposition: [place] tomorrow to ALF  DVT PPX:  Anticoagulated  Code:  NO CPR - SUPPORT OK     Subjective   Feels good. No complaints         Objective   Physical Exam:     Vitals: T:97.9 F (36.6 C) (Oral), BP:103/50, HR:96, RR:16, SaO2:96%    General: Patient is awake. In no acute distress.  HEENT: No conjunctival drainage, vision is intact, anicteric sclera.  Neck: Supple, no thyromegaly.  Chest: clear breath sounds bilaterally. No rhonchi, no wheezing. No use of accessory muscles.  CVS: Normal rate and regular rhythm no murmurs, without JVD, trace pitting leg edema,  pulses palpable.  Abdomen: Soft, non-tender, no guarding or rigidity, with normal bowel sounds.  Extremities: No calf swelling and no gross deformity.  Skin: Warm, dry, no rash and no worrisome lesions.  NEURO: No motor or sensory deficits.  Psychiatric: Alert, interactive, appropriate, normal affect.  Weight Monitoring 01/18/2019 03/08/2019 03/13/2019 03/13/2019 03/13/2019 03/14/2019 03/15/2019   Height - 152.4 cm - - 152.4 cm - -   Height Method - Stated - - - - -   Weight 65.091 kg 62.596 kg 66 kg 64.1 kg - 64.32 kg 63.1 kg   Weight Method Standing Scale Stated - Standing Scale - Standing Scale Actual   BMI (calculated) - 27 kg/m2 - - - - -         Intake/Output Summary (Last 24 hours) at 03/15/2019 1707  Last data filed at 03/15/2019 1444  Gross per 24 hour   Intake 1160 ml   Output 650 ml   Net 510 ml     Body  mass index is 27.17 kg/m.     Meds:     Current Facility-Administered Medications   Medication Dose Route Frequency    aspirin EC  81 mg Oral QAM    dorzolamide  1 drop Left Eye TID    escitalopram  5 mg Oral Daily    folic acid  1 mg Oral Daily    furosemide  40 mg Oral BID    levothyroxine  75 mcg Oral Daily at 0600    potassium chloride  20 mEq Oral QAM    pravastatin  40 mg Oral QAM    sodium chloride (PF)  3 mL Intravenous Q8H    warfarin  3 mg Oral Daily at 1800    warfarin therapy placeholder  1 each Does not apply See Admin Instructions       PRN Meds: acetaminophen **OR** acetaminophen **OR** acetaminophen, albuterol, bisacodyl, [START ON 03/16/2019] lactulose, naloxone, ondansetron **OR** ondansetron, polyethylene glycol, prochlorperazine.     LABS:     Estimated Creatinine Clearance: 20.2 mL/min (A) (based on SCr of 1.32 mg/dL (H)).  Recent Labs   Lab 03/15/19  0428 03/14/19  0418   WBC 6.9 6.7   RBC 2.82* 2.67*   Hemoglobin 9.1* 8.6*   Hematocrit 29.0* 27.1*   MCV 103* 102*   PLT CT 234 241     Recent Labs   Lab 03/15/19  0428 03/14/19  0911 03/13/19  1308   PT 19.3* 17.9* 16.8*   PT INR  1.9* 1.7* 1.6*     Recent Labs   Lab 03/13/19  1308   Troponin I 0.01     No results found for: [id number]  Recent Labs   Lab 03/15/19  0428 03/14/19  0418 03/13/19  1308   Glucose 95 84 90   Sodium 141 141 142   Potassium 4.0 3.3* 3.8   Chloride 102 101 104   CO2 29 29 27    BUN 20 21 20    Creatinine 1.32* 1.37* 1.47*   EGFR 34* 32* 30*   Calcium 8.9 8.9 9.2     Recent Labs   Lab 03/14/19  0418 03/13/19  1308   Magnesium 1.9  --    Phosphorus 4.1  --    Albumin 2.9* 3.5   Protein, Total 5.5* 6.2   Bilirubin, Total 0.7 0.9   Alkaline Phosphatase 60 73   ALT 9 12   AST (SGOT) 16 20           Invalid input(s):  AMORPHOUSUA   Patient Lines/Drains/Airways Status    Active PICC Line / CVC Line / PIV Line / Drain / Airway / Intraosseous Line / Epidural Line / ART Line / Line / Wound / Pressure Ulcer / NG/OG Tube     Name:   Placement date:   Placement time:   Site:   Days:    Peripheral IV 03/13/19 Right Antecubital   03/13/19    1314    Antecubital   1    External Urinary Catheter   03/13/19    1320    --   1               Xr Chest Ap Portable    Result Date: 03/13/2019  No significant change from 5 days prior. Persistent small right effusion and scattered interstitial markings most likely fluid overload. ReadingStation:[email or web address]     Home Health Needs:  There are no questions and answers to display.  Nutrition assessment done in collaboration with Registered Dietitians:     Time spent: 25 minutes     Eben Burow, MD     03/15/19,5:07 PM   MRN: [id number]                                      CSN: [id number] DOB: Nov 23, 1919

## 2019-03-15 NOTE — Progress Notes (Signed)
1900: Assumed care. No S/S of distress. Pt denies pain at this time. Pt resting comfortably in bed. 2L NC O2. Tele on. Pt compliant to 1800 FR. Assessment completed and meds administered per [facility]. Call bell within reach. Bed alarm on. All needs met.

## 2019-03-15 NOTE — Progress Notes (Addendum)
Pharmacy Consult Warfarin Dosing  Kelli Brown    Age: 85 y.o.  Weight: 63.1 kg (139 lb 1.8 oz)  Indication: a fib  Goal INR: 2-3  Home warfarin dose = 3 mg daily  Interacting agent/disease:     Subjective/Objective:   Kelli Brown is a 85 y.o. female patient admitted for the complaints of shortness of breath and leg swelling since yesterday. Apparently, patient was found to be hypoxic at the [place] [facility] and hence she was brought in for further evaluation. She was recently seen in the ER for similar issue.    Assessment/Plan:  . INR = 1.9  . Warfarin 3 mg today  . Daily INR  . Monitor for signs and symptoms of bleeding  . Avoid IM injections    Past Medical History:   Diagnosis Date   . Abnormal vision    . Arthritis    . Atrial fibrillation    . Congestive heart failure    . Glaucoma    . Hip fx, right, closed, initial encounter 2010   . Hyperlipidemia    . Hypertension    . Low back pain    . Macular degeneration    . Nonrheumatic aortic (valve) stenosis 08/11/2017   . Shingles         Recent Labs   Lab 03/14/19  0418   Bilirubin, Total 0.7   Bilirubin, Direct 0.3   Protein, Total 5.5*   Albumin 2.9*   ALT 9   AST (SGOT) 16         Current warfarin dose =  (hold for INR greater than 3.5)  Date 4/20 4/21 4/22         INR 1.6 1.7 1.9         dose 3 mg 3mg  3mg          Hgb 9.8  9.1         Plt 267  234           If you have any questions, please contact the pharmacist at [phone].    Stefani Dama, PharmD

## 2019-03-15 NOTE — Progress Notes (Addendum)
Quick Doc  [facility] - GI/ENDO/GEN MED   Patient Name: [facility] WHITE   Attending Physician: Eben Burow, MD   Today's date:   03/15/2019 LOS: 2 days   Expected Discharge Date      Quick  Assessment:                                                              ReAdmit Risk Score: 27    CM Comments: 03/15/19 RNCM (PH) acute on chronic CHF. requiring o2 w/desat at sleep. Spoke w/granddtr-Emily. Pt will be returning to [facility] in [place] w/plan to move to [facility] in [place] next Wednesday, agreeable to VMT w/c Zenaida Niece transport to [facility].  If O2 is needed-[employer] has company in [phone] spoke w/Angela, they can provide services w/transfer of RX from [place] to [place].  Granddtr updated.  clinicals placed in [place] and faxed to [facility].  Awaiting medical readiness for discharge and acceptance back into the facility.  RNCM following.     1:04 PM Spoke w/Samantha at [facility], they are able to accept back into the facility tomorrow.  Transport has been arranged for w/c van VMT at 1100 pickup-noted that pt would be traveling with oxygen.  [facility] will set up and deliver o2 tank tomorrow morning.  Discussed w/[facility] that pt will be going [place] and will need E-tanks vs portable concentrator.  K. Toney at [facility] stated that one E-tank is 4hr at 2L/min.  Family would need to stop by and pick up 2 additional tanks for transport and that the concentrator would go from [place] to [place] w/family since they are in the same company and service area.  Dr. Ladoris Gene notified of above and will discharge pt tomorrow.      AVS will need all pages signed by MD for the assisted living facility.        Physical Discharge Disposition: Assisted Living Facility  Name of Assisted Living Facility: [facility] in [place]                       Receiving facility, unit and room number:: [facility]        Mode of Transportation: Wheelchair [employer]     Patient/Family/POA notified of transfer plan:  Yes  Patient agreeable to discharge plan/expected d/c date?: Yes  Family/POA agreeable to discharge plan/expected d/c date?: Yes  Bedside nurse notified of transport plan?: Yes                       Physical Discharge Disposition: Assisted Living Facility       Provider Notifications:        Boyd Kerbs A. Leonor Liv, RN MSN  Case Management  Ph: [phone]  Fax: [phone]

## 2019-03-15 NOTE — Plan of Care (Signed)
NURSE NOTE SUMMARY  [facility] - GI/ENDO/GEN MED   Patient Name: Kelli Brown   Attending Physician: Eben Burow, MD   Today's date:   03/15/2019 LOS: 2 days   Shift Summary:                                                              Assumed care at 0700. Patient resting in bed with no complaints of pain. Patient wearing 2L NC with sats 91% and above. Assessment complete. VSS. Scheduled medications administered per MAR orders. Call bell in reach, bed at lowest point and bed exit alarm on. Will continue to monitor.      Provider Notifications:      Rapid Response Notifications:  Mobility:      PMP Activity: Step 6 - Walks in Room (03/15/2019  7:27 AM)     Weight tracking:  Family Dynamic:   Last 3 Weights for the past 72 hrs (Last 3 readings):   Weight   03/15/19 0442 63.1 kg (139 lb 1.8 oz)   03/14/19 0357 64.3 kg (141 lb 12.8 oz)   03/13/19 1651 64.1 kg (141 lb 5 oz)             Recent Vitals Last Bowel Movement   BP: 104/48 (03/15/2019  7:32 AM)  Heart Rate: 85 (03/15/2019  7:32 AM)  Temp: 97.9 F (36.6 C) (03/15/2019  7:32 AM)  Resp Rate: 18 (03/15/2019  7:32 AM)  Weight: 63.1 kg (139 lb 1.8 oz) (03/15/2019  4:42 AM)  SpO2: 91 % (03/15/2019  7:32 AM)   Last BM Date: 03/14/19          Problem: Moderate/High Fall Risk Score >5  Description  Fall Risk Score > 5  Goal: Patient will remain free of falls  Outcome: Progressing  Flowsheets (Taken 03/15/2019 0727)  VH Moderate Risk (6-13): YELLOW NON-SKID SLIPPERS;YELLOW "FALL RISK" ARM BAND;USE OF BED EXIT ALARM IF PATIENT IS CONFUSED OR IMPULSIVE. PLACE RESET BED ALARM SIGN ABOVE BED;PLACE FALL RISK LEVEL ON Brown BOARD FOR COMMUNICATION PURPOSES IN PATIENT'S ROOM;Use chair-pad alarm device;Use assistive devices     Problem: Compromised Hemodynamic Status  Goal: Vital signs and fluid balance maintained/improved  Description  Interventions:  1. Position patient for maximum circulation / cardiac output  2. Monitor and assess vitals and hemodynamic  parameters with position changes  3. Monitor and compare daily weight  4. Monitor intake and output.  Notify LIP if urine output is less than 30 mL/hour   5. Monitor/assess lab values and report abnormal values  Outcome: Progressing  Flowsheets (Taken 03/14/2019 2106 by Maurice Small, RN)  Vital signs and fluid balance are maintained/improved: Position patient for maximum circulation/cardiac output;Monitor/assess vitals and hemodynamic parameters with position changes;Monitor and compare daily weight;Monitor/assess lab values and report abnormal values;Monitor intake and output. Notify LIP if urine output is less than 30 mL/hour.

## 2019-03-16 LAB — CBC AND DIFFERENTIAL
Basophils %: 0.9 % (ref 0.0–3.0)
Basophils Absolute: 0.1 10*3/uL (ref 0.0–0.3)
Eosinophils %: 11.1 % — ABNORMAL HIGH (ref 0.0–7.0)
Eosinophils Absolute: 0.7 10*3/uL (ref 0.0–0.8)
Hematocrit: 28.4 % — ABNORMAL LOW (ref 36.0–48.0)
Hemoglobin: 8.9 gm/dL — ABNORMAL LOW (ref 12.0–16.0)
Lymphocytes Absolute: 1.9 10*3/uL (ref 0.6–5.1)
Lymphocytes: 29.6 % (ref 15.0–46.0)
MCH: 32 pg (ref 28–35)
MCHC: 31 gm/dL — ABNORMAL LOW (ref 32–36)
MCV: 102 fL — ABNORMAL HIGH (ref 80–100)
MPV: 6.7 fL (ref 6.0–10.0)
Monocytes Absolute: 0.8 10*3/uL (ref 0.1–1.7)
Monocytes: 11.9 % (ref 3.0–15.0)
Neutrophils %: 46.5 % (ref 42.0–78.0)
Neutrophils Absolute: 3 10*3/uL (ref 1.7–8.6)
PLT CT: 221 10*3/uL (ref 130–440)
RBC: 2.77 10*6/uL — ABNORMAL LOW (ref 3.80–5.00)
RDW: 19.6 % — ABNORMAL HIGH (ref 11.0–14.0)
WBC: 6.4 10*3/uL (ref 4.0–11.0)

## 2019-03-16 LAB — BASIC METABOLIC PANEL
Anion Gap: 12.5 mMol/L (ref 7.0–18.0)
BUN / Creatinine Ratio: 25.9 Ratio (ref 10.0–30.0)
BUN: 29 mg/dL — ABNORMAL HIGH (ref 7–22)
CO2: 30 mMol/L (ref 20–30)
Calcium: 8.9 mg/dL (ref 8.5–10.5)
Chloride: 100 mMol/L (ref 98–110)
Creatinine: 1.12 mg/dL (ref 0.60–1.20)
EGFR: 41 mL/min/{1.73_m2} — ABNORMAL LOW (ref 60–150)
Glucose: 94 mg/dL (ref 71–99)
Osmolality Calculated: 283 mOsm/kg (ref 275–300)
Potassium: 3.5 mMol/L (ref 3.5–5.3)
Sodium: 139 mMol/L (ref 136–147)

## 2019-03-16 LAB — PT/INR
PT INR: 2.1 — ABNORMAL HIGH (ref 0.5–1.3)
PT: 21.7 s — ABNORMAL HIGH (ref 9.5–11.5)

## 2019-03-16 MED ORDER — FUROSEMIDE 40 MG PO TABS
40.00 mg | ORAL_TABLET | Freq: Two times a day (BID) | ORAL | 0 refills | Status: AC
Start: 2019-03-16 — End: ?

## 2019-03-16 NOTE — Progress Notes (Signed)
Quick Doc  [facility] - GI/ENDO/GEN MED   Patient Name: [facility] WHITE   Attending Physician: Alvino Blood, MD   Today's date:   03/16/2019 LOS: 3 days   Expected Discharge Date      Quick  Assessment:                                                              ReAdmit Risk Score: 28    CM Comments: 03/16/19 RNCM (PH) acute on chronic CHF. O2-2L/min-DME is [facility], See note from 4/22. Spoke w/granddtr E. Ladd-explained process of picking up additional tanks at [facility] in [place] and taking concentrator w/her to [place] per Kait at [employer].  Dr. Alla Feeling updated on discharge plan and to sign AVS pages.  VMT w/c van arranged for 1100.  RN updated.  discharge summary and unsigned AVS faxed to facility    Physical Discharge Disposition: Assisted Living Facility  Name of Assisted Living Facility: [facility]                       Receiving facility, unit and room number:: [facility]  Nursing report phone number:: [phone]  Facility fax number:: [phone]  Mode of Transportation: Wheelchair [facility] up time: 1100 on 4/23  Patient/Family/POA notified of transfer plan: Yes  Patient agreeable to discharge plan/expected d/c date?: Yes  Family/POA agreeable to discharge plan/expected d/c date?: Yes  Bedside nurse notified of transport plan?: Yes                       Physical Discharge Disposition: Assisted Living Facility       Provider Notifications:        Boyd Kerbs A. Leonor Liv, RN MSN  Case Management  Ph: [phone]  Fax: [phone]

## 2019-03-16 NOTE — Discharge Summary (Signed)
DISCHARGE SUMMARY - [facility]    Patient Name: Kelli Brown  Attending Physician: Alvino Blood, MD  Primary Care Physician: Flossie Dibble, MD    Date of Admission: 03/13/2019  Date of Discharge: 03/16/2019    Discharge Diagnoses:                                                         [facility]       Acute on chronic diastolic heart failure/Moderate aortic stenosis  Improved  Will discharge on Lasix 40 mg po BID  Heart healthy diet     Hypoxia due to CHF/ ILD/ pulmonary hypertension  Home 02 2L via [place] arranged   Drops sats to 70's especially at night    AKI onCKD stage IIIlikely cardiorenal  Improved   Monitor creatinine while on lasix    Hypokalemia  Replaced    Mild anemia due to CKD  Stable    Paroxysmal atrial fibrillation  Controlled  Continue Coumadin for stroke prophylaxis    HTN (hypertension):   controlled, continue home meds with hold parameters K    Hypothyroidism  Continue synthroid     Hyperlipidemia  Continue with home meds      Hospital Course:                               [facility]        Kelli Brown is a 85 y.o. female patient admitted for the complaints of shortness of breath and leg swelling since yesterday. Apparently, patient was found to be hypoxic at the [facility] and hence she was brought in for further evaluation. She was recently seen in the ER for similar issue. She denies any chest pain, dizziness, palpitations, fever, cough, chills, nausea, vomiting, abdominal pain, lack of appetite. C/o leg swelling    (Please see admission History and Physical for details.)    Patient admitted with impression of acute on chronic diastolic CHF, she was started on IV Lasix with good diuresis.  She is requiring O2 2 L via nasal cannula.  She will be discharged on Lasix 40 mg p.o. twice daily and home O2 will be arranged.       Discharge Instructions:                                                 [facility]        Diet:2 gm Sodium  diet    Activity/Weight Bearing Status: As tolerated.    FOLLOWUP: No follow-up provider specified.    Complete instructions and follow up are in the patient's After Visit Summary (AVS).    Discharge Medications:                                                   [facility]          Discharge Medication List      Taking    aspirin EC 81  MG EC tablet  Dose:  81 mg  Take 81 mg by mouth every morning.     calcium carbonate 1500 (600 Ca) MG Tabs tablet  Dose:  1,500 mg  Take 1,500 mg by mouth every morning     CENTRUM WOMEN PO  Dose:  1 tablet  Take 1 tablet by mouth every morning.     dorzolamide 2 % ophthalmic solution  Dose:  1 drop  Commonly known as:  TRUSOPT  Place 1 drop into the left eye 3 (three) times daily.      escitalopram 5 MG tablet  Dose:  5 mg  Commonly known as:  LEXAPRO  Take 5 mg by mouth daily     folic acid 1 MG tablet  Dose:  1 mg  Commonly known as:  FOLVITE  Take 1 tablet (1 mg total) by mouth daily     furosemide 40 MG tablet  Dose:  40 mg  What changed:     medication strength   how much to take   when to take this  Commonly known as:  LASIX  Take 1 tablet (40 mg total) by mouth 2 (two) times daily     levothyroxine 50 MCG tablet  Commonly known as:  SYNTHROID, LEVOTHROID  Once a day at 6:00am      potassium chloride 20 MEQ tablet  Dose:  20 mEq  Commonly known as:  K-DUR,KLOR-CON  Take 20 mEq by mouth every morning      pravastatin 40 MG tablet  Dose:  40 mg  Commonly known as:  PRAVACHOL  Take 40 mg by mouth every morning      vitamin D 25 MCG (1000 UT) tablet  Dose:  1,000 Units  Commonly known as:  CHOLECALCIFEROL  Take 1,000 Units by mouth every morning     warfarin 3 MG tablet  Commonly known as:  COUMADIN  daily             Discharge Day Physical Exam:  Temp:  [97.5 F (36.4 C)-98.2 F (36.8 C)] 98.2 F (36.8 C)  Heart Rate:  [93-108] 108  Resp Rate:  [16-18] 17  BP: (83-117)/(40-50) 99/48  Body mass index is 27.36 kg/m.    General: awake, alert, oriented x 3; no acute  distress.  HEENT: perrla, eomi, sclera anicteric  oropharynx clear without lesions, mucous membranes moist  Glands: No cervical or axillary lymphadenopathy.  Neck: supple, no lymphadenopathy, no thyromegaly, no JVD, no carotid bruits  Cardiovascular: regular rate and rhythm, no rubs or gallops  Lungs: clear to auscultation bilaterally, without wheezing, rhonchi, or rales  Abdomen: soft, non-tender, non-distended; no palpable masses, no hepatosplenomegaly, normoactive bowel sounds, no rebound or guarding  Extremities: no clubbing, cyanosis, or edema  Neuro: cranial nerves grossly intact, strength 5/5 in upper and lower extremities, sensation intact,   Psych: Normal affect, not depressed   Skin: no rashes or lesions noted    Consultations:                                                                    [facility]         Treatment Team: Attending Provider: Alvino Blood, MD; Consulting Physician: Eben Burow, MD; Case Manager: Lynnea Maizes  A, RN; Technician: Cletis Athens, CNA; Registered Nurse: Merlene Morse, RN; Registered Nurse: Heloise Ochoa, RN        Discharge Condition:                                                     [facility]       The patient was discharged in stable condition.    Total time spent in counseling and/or coordination of discharge plan with other physicians, other health care professionals, patient and/or family is 38 minutes.    Signed by: Alvino Blood, MD  Pager # [phone] [facility] [facility]  [address]  [place], [place]    CC: Flossie Dibble, MD    This note was generated within an electronic medical record, and portions of it may have been completed with voice recognition software. Typographical, word substitution, pronoun and other language errors may occur. If there are substantial concerns about the content of this note that may affect patient care, please contact the author for clarification.

## 2019-03-16 NOTE — Consults (Signed)
RNCM Consult Note      Resumption of care for Home Health completed.    Larry Knipp A. Leonor Liv, RN MSN  Case Management  Ph: [phone]  Fax: [phone]

## 2019-05-25 ENCOUNTER — Ambulatory Visit: Payer: Self-pay

## 2019-12-24 ENCOUNTER — Emergency Department: Payer: Medicare Other

## 2019-12-24 ENCOUNTER — Other Ambulatory Visit: Payer: Self-pay

## 2019-12-24 ENCOUNTER — Inpatient Hospital Stay
Admission: EM | Admit: 2019-12-24 | Discharge: 2019-12-24 | DRG: 178 | Disposition: A | Discharge status: Other Acute Inpatient Hospital | Payer: Medicare Other | Attending: Internal Medicine | Admitting: Internal Medicine

## 2019-12-24 ENCOUNTER — Encounter (HOSPITAL_COMMUNITY): Payer: Self-pay | Admitting: Family Medicine

## 2019-12-24 ENCOUNTER — Inpatient Hospital Stay (HOSPITAL_COMMUNITY)
Admission: AD | Admit: 2019-12-24 | Discharge: 2020-01-22 | DRG: 177 | Disposition: E | Payer: Medicare Other | Source: Other Acute Inpatient Hospital | Attending: Internal Medicine | Admitting: Internal Medicine

## 2019-12-24 ENCOUNTER — Encounter: Payer: Self-pay | Admitting: Emergency Medicine

## 2019-12-24 DIAGNOSIS — Z7901 Long term (current) use of anticoagulants: Secondary | ICD-10-CM | POA: Diagnosis not present

## 2019-12-24 DIAGNOSIS — J069 Acute upper respiratory infection, unspecified: Secondary | ICD-10-CM | POA: Diagnosis present

## 2019-12-24 DIAGNOSIS — I10 Essential (primary) hypertension: Secondary | ICD-10-CM | POA: Diagnosis not present

## 2019-12-24 DIAGNOSIS — I509 Heart failure, unspecified: Secondary | ICD-10-CM

## 2019-12-24 DIAGNOSIS — N289 Disorder of kidney and ureter, unspecified: Secondary | ICD-10-CM

## 2019-12-24 DIAGNOSIS — I110 Hypertensive heart disease with heart failure: Secondary | ICD-10-CM | POA: Diagnosis present

## 2019-12-24 DIAGNOSIS — E785 Hyperlipidemia, unspecified: Secondary | ICD-10-CM | POA: Diagnosis present

## 2019-12-24 DIAGNOSIS — U071 COVID-19: Secondary | ICD-10-CM | POA: Diagnosis present

## 2019-12-24 DIAGNOSIS — J90 Pleural effusion, not elsewhere classified: Secondary | ICD-10-CM | POA: Diagnosis present

## 2019-12-24 DIAGNOSIS — R791 Abnormal coagulation profile: Secondary | ICD-10-CM | POA: Diagnosis present

## 2019-12-24 DIAGNOSIS — N179 Acute kidney failure, unspecified: Secondary | ICD-10-CM | POA: Diagnosis present

## 2019-12-24 DIAGNOSIS — I4891 Unspecified atrial fibrillation: Secondary | ICD-10-CM | POA: Diagnosis present

## 2019-12-24 DIAGNOSIS — F329 Major depressive disorder, single episode, unspecified: Secondary | ICD-10-CM | POA: Diagnosis present

## 2019-12-24 DIAGNOSIS — I480 Paroxysmal atrial fibrillation: Secondary | ICD-10-CM | POA: Diagnosis present

## 2019-12-24 DIAGNOSIS — D649 Anemia, unspecified: Secondary | ICD-10-CM | POA: Diagnosis present

## 2019-12-24 DIAGNOSIS — J1282 Pneumonia due to coronavirus disease 2019: Secondary | ICD-10-CM | POA: Diagnosis present

## 2019-12-24 DIAGNOSIS — Z66 Do not resuscitate: Secondary | ICD-10-CM | POA: Diagnosis present

## 2019-12-24 DIAGNOSIS — Z79899 Other long term (current) drug therapy: Secondary | ICD-10-CM

## 2019-12-24 DIAGNOSIS — Z803 Family history of malignant neoplasm of breast: Secondary | ICD-10-CM

## 2019-12-24 DIAGNOSIS — R0902 Hypoxemia: Secondary | ICD-10-CM | POA: Diagnosis present

## 2019-12-24 DIAGNOSIS — J9601 Acute respiratory failure with hypoxia: Secondary | ICD-10-CM | POA: Diagnosis present

## 2019-12-24 DIAGNOSIS — Z79890 Hormone replacement therapy (postmenopausal): Secondary | ICD-10-CM

## 2019-12-24 DIAGNOSIS — R197 Diarrhea, unspecified: Secondary | ICD-10-CM | POA: Diagnosis present

## 2019-12-24 DIAGNOSIS — Z515 Encounter for palliative care: Secondary | ICD-10-CM | POA: Diagnosis present

## 2019-12-24 DIAGNOSIS — I5032 Chronic diastolic (congestive) heart failure: Secondary | ICD-10-CM | POA: Diagnosis present

## 2019-12-24 DIAGNOSIS — E039 Hypothyroidism, unspecified: Secondary | ICD-10-CM | POA: Diagnosis present

## 2019-12-24 DIAGNOSIS — R0600 Dyspnea, unspecified: Secondary | ICD-10-CM

## 2019-12-24 HISTORY — DX: Hyperlipidemia, unspecified: E78.5

## 2019-12-24 HISTORY — DX: Depression, unspecified: F32.A

## 2019-12-24 HISTORY — DX: Hypothyroidism, unspecified: E03.9

## 2019-12-24 HISTORY — DX: Essential (primary) hypertension: I10

## 2019-12-24 HISTORY — DX: Unspecified atrial fibrillation: I48.91

## 2019-12-24 HISTORY — DX: Heart failure, unspecified: I50.9

## 2019-12-24 LAB — COMPREHENSIVE METABOLIC PANEL
ALT: 15 U/L (ref 0–44)
AST: 31 U/L (ref 15–41)
Albumin: 3.7 g/dL (ref 3.5–5.0)
Alkaline Phosphatase: 60 U/L (ref 38–126)
Anion gap: 13 (ref 5–15)
BUN: 21 mg/dL (ref 8–23)
CO2: 26 mmol/L (ref 22–32)
Calcium: 9 mg/dL (ref 8.9–10.3)
Chloride: 101 mmol/L (ref 98–111)
Creatinine, Ser: 1.3 mg/dL — ABNORMAL HIGH (ref 0.44–1.00)
GFR calc Af Amer: 40 mL/min — ABNORMAL LOW (ref >60)
GFR calc non Af Amer: 34 mL/min — ABNORMAL LOW (ref >60)
Glucose, Bld: 141 mg/dL — ABNORMAL HIGH (ref 70–99)
Potassium: 4.2 mmol/L (ref 3.5–5.1)
Sodium: 140 mmol/L (ref 135–145)
Total Bilirubin: 1 mg/dL (ref 0.3–1.2)
Total Protein: 7.5 g/dL (ref 6.5–8.1)

## 2019-12-24 LAB — LACTATE DEHYDROGENASE: LDH: 289 U/L — ABNORMAL HIGH (ref 98–192)

## 2019-12-24 LAB — CBC WITH DIFFERENTIAL/PLATELET
Abs Immature Granulocytes: 0.21 10*3/uL — ABNORMAL HIGH (ref 0.00–0.07)
Basophils Absolute: 0 10*3/uL (ref 0.0–0.1)
Basophils Relative: 0 %
Eosinophils Absolute: 0 10*3/uL (ref 0.0–0.5)
Eosinophils Relative: 0 %
HCT: 34.6 % — ABNORMAL LOW (ref 36.0–46.0)
Hemoglobin: 10.8 g/dL — ABNORMAL LOW (ref 12.0–15.0)
Immature Granulocytes: 4 %
Lymphocytes Relative: 12 %
Lymphs Abs: 0.6 10*3/uL — ABNORMAL LOW (ref 0.7–4.0)
MCH: 30.3 pg (ref 26.0–34.0)
MCHC: 31.2 g/dL (ref 30.0–36.0)
MCV: 97.2 fL (ref 80.0–100.0)
Monocytes Absolute: 0.3 10*3/uL (ref 0.1–1.0)
Monocytes Relative: 6 %
Neutro Abs: 4.2 10*3/uL (ref 1.7–7.7)
Neutrophils Relative %: 78 %
Platelets: 175 10*3/uL (ref 150–400)
RBC: 3.56 MIL/uL — ABNORMAL LOW (ref 3.87–5.11)
RDW: 16.8 % — ABNORMAL HIGH (ref 11.5–15.5)
WBC: 5.4 10*3/uL (ref 4.0–10.5)
nRBC: 0 % (ref 0.0–0.2)

## 2019-12-24 LAB — PROCALCITONIN: Procalcitonin: <0.1 ng/mL

## 2019-12-24 LAB — PROTIME-INR
INR: 5.4 (ref 0.8–1.2)
Prothrombin Time: 49.7 seconds — ABNORMAL HIGH (ref 11.4–15.2)

## 2019-12-24 LAB — FERRITIN: Ferritin: 341 ng/mL — ABNORMAL HIGH (ref 11–307)

## 2019-12-24 LAB — FIBRINOGEN: Fibrinogen: 722 mg/dL — ABNORMAL HIGH (ref 210–475)

## 2019-12-24 LAB — FIBRIN DERIVATIVES D-DIMER (ARMC ONLY): Fibrin derivatives D-dimer (ARMC): 937.21 ng/mL (FEU) — ABNORMAL HIGH (ref 0.00–499.00)

## 2019-12-24 LAB — BRAIN NATRIURETIC PEPTIDE: B Natriuretic Peptide: 292 pg/mL — ABNORMAL HIGH (ref 0.0–100.0)

## 2019-12-24 LAB — LACTIC ACID, PLASMA
Lactic Acid, Venous: 1.5 mmol/L (ref 0.5–1.9)
Lactic Acid, Venous: 1.1 mmol/L (ref 0.5–1.9)

## 2019-12-24 LAB — TROPONIN I (HIGH SENSITIVITY): Troponin I (High Sensitivity): 46 ng/L — ABNORMAL HIGH (ref <18)

## 2019-12-24 LAB — POC SARS CORONAVIRUS 2 AG: SARS Coronavirus 2 Ag: POSITIVE — AB

## 2019-12-24 LAB — C-REACTIVE PROTEIN: CRP: 11.8 mg/dL — ABNORMAL HIGH (ref <1.0)

## 2019-12-24 LAB — HEPATITIS B SURFACE ANTIGEN: Hepatitis B Surface Ag: NONREACTIVE

## 2019-12-24 MED ORDER — NYSTATIN 100000 UNIT/GM EX POWD
1.0000 "application " | Freq: Every day | CUTANEOUS | Status: DC
  Administered 2019-12-24 – 2019-12-25 (×2): 1 via TOPICAL
  Filled 2019-12-24: qty 15

## 2019-12-24 MED ORDER — KETOCONAZOLE 2 % EX CREA
1.0000 "application " | TOPICAL_CREAM | Freq: Every day | CUTANEOUS | Status: DC
  Administered 2019-12-25 – 2019-12-26 (×2): 1 via TOPICAL
  Filled 2019-12-24: qty 15

## 2019-12-24 MED ORDER — SODIUM CHLORIDE 0.9% FLUSH
3.0000 mL | Freq: Two times a day (BID) | INTRAVENOUS | Status: DC
  Administered 2019-12-25 – 2019-12-26 (×3): 3 mL via INTRAVENOUS

## 2019-12-24 MED ORDER — ZINC OXIDE 40 % EX OINT
TOPICAL_OINTMENT | Freq: Three times a day (TID) | CUTANEOUS | Status: DC
  Administered 2019-12-25: 1 via TOPICAL
  Filled 2019-12-24: qty 57

## 2019-12-24 MED ORDER — PRAVASTATIN SODIUM 40 MG PO TABS
80.0000 mg | ORAL_TABLET | Freq: Every day | ORAL | Status: DC
  Administered 2019-12-24 – 2019-12-25 (×2): 80 mg via ORAL
  Filled 2019-12-24 (×2): qty 2

## 2019-12-24 MED ORDER — SODIUM CHLORIDE 0.9% FLUSH
3.0000 mL | Freq: Two times a day (BID) | INTRAVENOUS | Status: DC
  Administered 2019-12-24 – 2019-12-26 (×4): 3 mL via INTRAVENOUS

## 2019-12-24 MED ORDER — ONDANSETRON HCL 4 MG PO TABS: 4.0000 mg | ORAL_TABLET | Freq: Four times a day (QID) | ORAL | Status: DC | PRN

## 2019-12-24 MED ORDER — ASCORBIC ACID 500 MG PO TABS
500.0000 mg | ORAL_TABLET | Freq: Every day | ORAL | Status: DC
  Administered 2019-12-24: 500 mg via ORAL
  Filled 2019-12-24: qty 1

## 2019-12-24 MED ORDER — MENTHOL 3 MG MT LOZG
1.0000 | LOZENGE | OROMUCOSAL | Status: DC | PRN
  Filled 2019-12-24: qty 9

## 2019-12-24 MED ORDER — AMIODARONE HCL 100 MG PO TABS
100.0000 mg | ORAL_TABLET | Freq: Every day | ORAL | Status: DC
  Administered 2019-12-25: 100 mg via ORAL
  Filled 2019-12-24: qty 1

## 2019-12-24 MED ORDER — ONDANSETRON HCL 4 MG/2ML IJ SOLN: 4.0000 mg | Freq: Three times a day (TID) | INTRAMUSCULAR | Status: DC | PRN

## 2019-12-24 MED ORDER — SODIUM CHLORIDE 0.9 % IV SOLN
250.0000 mL | INTRAVENOUS | Status: DC | PRN
  Administered 2019-12-26: 250 mL via INTRAVENOUS

## 2019-12-24 MED ORDER — LOPERAMIDE HCL 2 MG PO CAPS
4.0000 mg | ORAL_CAPSULE | Freq: Once | ORAL | Status: AC
  Administered 2019-12-24: 4 mg via ORAL
  Filled 2019-12-24: qty 2

## 2019-12-24 MED ORDER — IPRATROPIUM BROMIDE HFA 17 MCG/ACT IN AERS
2.0000 | INHALATION_SPRAY | RESPIRATORY_TRACT | Status: DC
  Filled 2019-12-24: qty 12.9

## 2019-12-24 MED ORDER — METHYLPREDNISOLONE SODIUM SUCC 40 MG IJ SOLR
40.0000 mg | Freq: Two times a day (BID) | INTRAMUSCULAR | Status: DC
  Administered 2019-12-24: 40 mg via INTRAVENOUS
  Filled 2019-12-24: qty 1

## 2019-12-24 MED ORDER — DERMACLOUD EX CREA: TOPICAL_CREAM | Freq: Three times a day (TID) | CUTANEOUS | Status: DC

## 2019-12-24 MED ORDER — SODIUM CHLORIDE 0.9 % IV SOLN
200.0000 mg | Freq: Once | INTRAVENOUS | Status: AC
  Administered 2019-12-24: 15:00:00 200 mg via INTRAVENOUS
  Filled 2019-12-24: qty 200

## 2019-12-24 MED ORDER — LEVOTHYROXINE SODIUM 75 MCG PO TABS
75.0000 ug | ORAL_TABLET | Freq: Every day | ORAL | Status: DC
  Administered 2019-12-25 – 2019-12-26 (×2): 75 ug via ORAL
  Filled 2019-12-24 (×2): qty 1

## 2019-12-24 MED ORDER — METOPROLOL SUCCINATE ER 25 MG PO TB24
50.0000 mg | ORAL_TABLET | Freq: Every day | ORAL | Status: DC
  Administered 2019-12-25: 09:00:00 50 mg via ORAL
  Filled 2019-12-24: qty 2

## 2019-12-24 MED ORDER — ALBUTEROL SULFATE HFA 108 (90 BASE) MCG/ACT IN AERS
2.0000 | INHALATION_SPRAY | RESPIRATORY_TRACT | Status: DC | PRN
  Filled 2019-12-24: qty 6.7

## 2019-12-24 MED ORDER — MELATONIN 3 MG PO TABS
6.0000 mg | ORAL_TABLET | Freq: Every day | ORAL | Status: DC
  Administered 2019-12-24 – 2019-12-25 (×2): 6 mg via ORAL
  Filled 2019-12-24 (×2): qty 2

## 2019-12-24 MED ORDER — ACETAMINOPHEN 325 MG PO TABS: 650.0000 mg | ORAL_TABLET | Freq: Four times a day (QID) | ORAL | Status: DC | PRN

## 2019-12-24 MED ORDER — ESCITALOPRAM OXALATE 10 MG PO TABS
10.0000 mg | ORAL_TABLET | Freq: Every day | ORAL | Status: DC
  Administered 2019-12-25: 09:00:00 10 mg via ORAL
  Filled 2019-12-24 (×3): qty 1

## 2019-12-24 MED ORDER — SALINE SPRAY 0.65 % NA SOLN
2.0000 | NASAL | Status: DC | PRN
  Administered 2019-12-25: 2 via NASAL
  Filled 2019-12-24: qty 44

## 2019-12-24 MED ORDER — SODIUM CHLORIDE 0.9 % IV SOLN
100.0000 mg | Freq: Every day | INTRAVENOUS | Status: DC
  Filled 2019-12-24: qty 20

## 2019-12-24 MED ORDER — DORZOLAMIDE HCL 2 % OP SOLN
1.0000 [drp] | Freq: Three times a day (TID) | OPHTHALMIC | Status: DC
  Administered 2019-12-24 – 2019-12-26 (×5): 1 [drp] via OPHTHALMIC
  Filled 2019-12-24: qty 10

## 2019-12-24 MED ORDER — DM-GUAIFENESIN ER 30-600 MG PO TB12
1.0000 | ORAL_TABLET | Freq: Two times a day (BID) | ORAL | Status: DC
  Administered 2019-12-24: 1 via ORAL
  Filled 2019-12-24: qty 1

## 2019-12-24 MED ORDER — FUROSEMIDE 20 MG PO TABS: 60.0000 mg | ORAL_TABLET | Freq: Every day | ORAL | Status: DC

## 2019-12-24 MED ORDER — GUAIFENESIN-DM 100-10 MG/5ML PO SYRP: 10.0000 mL | ORAL_SOLUTION | ORAL | Status: DC | PRN

## 2019-12-24 MED ORDER — ONDANSETRON HCL 4 MG/2ML IJ SOLN
4.0000 mg | Freq: Four times a day (QID) | INTRAMUSCULAR | Status: DC | PRN
  Administered 2019-12-25: 4 mg via INTRAVENOUS
  Filled 2019-12-24: qty 2

## 2019-12-24 MED ORDER — LOPERAMIDE HCL 2 MG PO CAPS: 2.0000 mg | ORAL_CAPSULE | ORAL | Status: DC | PRN

## 2019-12-24 MED ORDER — ZINC SULFATE 220 (50 ZN) MG PO CAPS
220.0000 mg | ORAL_CAPSULE | Freq: Every day | ORAL | Status: DC
  Administered 2019-12-24: 220 mg via ORAL
  Filled 2019-12-24: qty 1

## 2019-12-24 MED ORDER — HYDRALAZINE HCL 50 MG PO TABS: 25.0000 mg | ORAL_TABLET | Freq: Three times a day (TID) | ORAL | Status: DC | PRN

## 2019-12-24 MED ORDER — SODIUM CHLORIDE 0.9 % IV BOLUS
500.0000 mL | Freq: Once | INTRAVENOUS | Status: AC
  Administered 2019-12-24: 500 mL via INTRAVENOUS

## 2019-12-24 MED ORDER — DEXAMETHASONE SODIUM PHOSPHATE 10 MG/ML IJ SOLN
6.0000 mg | INTRAMUSCULAR | Status: DC
  Administered 2019-12-24 – 2019-12-25 (×2): 6 mg via INTRAVENOUS
  Filled 2019-12-24 (×2): qty 1

## 2019-12-24 MED ORDER — SODIUM CHLORIDE 0.9% FLUSH: 3.0000 mL | INTRAVENOUS | Status: DC | PRN

## 2019-12-24 MED ORDER — SODIUM CHLORIDE 0.9 % IV SOLN
100.0000 mg | Freq: Every day | INTRAVENOUS | Status: DC
  Administered 2019-12-25 – 2019-12-26 (×2): 100 mg via INTRAVENOUS
  Filled 2019-12-24 (×2): qty 20

## 2019-12-24 NOTE — ED Notes (Signed)
Patient has been cleaned, gown and bed linen have been changed and patient repositioned.  Patient did have 3x episodes of diarrhea while getting patient clean.

## 2019-12-24 NOTE — ED Notes (Signed)
Date and time results received: 12/15/2019 1303   Test: PT/INR Critical Value: 49.7/5.44  Name of Provider Notified: Dr. Fanny Bien  Orders Received? Or Actions Taken?: none

## 2019-12-24 NOTE — ED Notes (Signed)
RN attempted to give report however accepting RN was providing patient care to another patient and will call ED RN Dore Oquin on phone number [phone].

## 2019-12-24 NOTE — H&P (Addendum)
History and Physical    Renee Estrada JME:[id number] DOB: 09-25-1919 DOA: 12/14/2019  Referring MD/NP/PA:   PCP: Angela Cox, MD   Patient coming from:  The patient is coming from SNF.  At baseline, pt is dependent for most of ADL.        Chief Complaint: Shortness of breath, cough  HPI: Renee Estrada is a 86 y.o. female with medical history significant of hypertension, hyperlipidemia, hypothyroidism, depression, atrial fibrillation on Coumadin, CHF, who presents with cough, shortness breath.  Patient had positive COVID-19 test on 12/20/2019, and he developed shortness of breath today which has been progressively worsening.  Patient has dry cough, no fever or chills.  No chest pain.  Patient has nausea and mild diarrhea, no vomiting or abdominal pain.  Patient has generalized weakness, no unilateral numbness or tingling his extremities. Patient had oxygen desaturation to 60s, which improved to 96% on 6 L nasal cannula oxygen.  Patient sitting in Coumadin, no active bleeding.   ED Course: pt was found to have positive Covid Ag test, INR 5.4, lactic acid 1.5, possible AKI with creatinine 1.30 and BUN 21 (no baseline creatinine available), temperature 99.3, blood pressure 166/68, tachycardia, tachypnea, oxygen saturation 96% on 6 L of nasal cannula oxygen.  Chest x-ray showed bilateral interstitial infiltration.  Patient is admitted to telemetry bed as inpatient.   Review of Systems:   General: no fevers, chills, no body weight gain, has fatigue HEENT: no blurry vision, hearing changes or sore throat Respiratory: has dyspnea, coughing, no wheezing CV: no chest pain, no palpitations GI: no nausea, vomiting, abdominal pain, diarrhea, constipation GU: no dysuria, burning on urination, increased urinary frequency, hematuria  Ext: no leg edema Neuro: no unilateral weakness, numbness, or tingling, no vision change or hearing loss Skin: no rash, no skin tear. MSK: No muscle spasm, no  deformity, no limitation of range of movement in spin Heme: No easy bruising.  Travel history: No recent long distant travel.  Allergy:  Allergies  Allergen Reactions  . Codeine     Past Medical History:  Diagnosis Date  . Atrial fibrillation (HCC)   . Atrial fibrillation (HCC)   . CHF (congestive heart failure) (HCC)   . Depression   . HLD (hyperlipidemia)   . Hypertension   . Hypothyroidism     History reviewed. No pertinent surgical history.  Social History:  reports that she has never smoked. She has never used smokeless tobacco. She reports that she does not drink alcohol or use drugs.  Family History:  Family History  Problem Relation Age of Onset  . Breast cancer Sister      Prior to Admission medications   Not on File    Physical Exam: Vitals:   12/14/2019 1344 12/08/2019 1345 12/23/2019 1346 11/27/2019 1347  BP:      Pulse:      Resp:      Temp:      TempSrc:      SpO2: (!) 85% (!) 84% (S) (!) 86% (!) 87%  Weight:      Height:       General: Not in acute distress HEENT:       Eyes: PERRL, EOMI, no scleral icterus.       ENT: No discharge from the ears and nose, no pharynx injection, no tonsillar enlargement.        Neck: No JVD, no bruit, no mass felt. Heme: No neck lymph node enlargement. Cardiac: S1/S2, RRR, No murmurs, No gallops or  rubs. Respiratory: has coarse breathing sounds bilaterally GI: Soft, nondistended, nontender, no rebound pain, no organomegaly, BS present. GU: No hematuria Ext: No pitting leg edema bilaterally. 2+DP/PT pulse bilaterally. Musculoskeletal: No joint deformities, No joint redness or warmth, no limitation of ROM in spin. Skin: No rashes.  Neuro: Alert, oriented X3, cranial nerves II-XII grossly intact, moves all extremities normally.  Psych: Patient is not psychotic, no suicidal or hemocidal ideation.  Labs on Admission: I have personally reviewed following labs and imaging studies  CBC: Recent Labs  Lab 12/30/19 1206    WBC 5.4  NEUTROABS 4.2  HGB 10.8*  HCT 34.6*  MCV 97.2  PLT 175   Basic Metabolic Panel: Recent Labs  Lab 2019/12/30 1206  NA 140  K 4.2  CL 101  CO2 26  GLUCOSE 141*  BUN 21  CREATININE 1.30*  CALCIUM 9.0   GFR: Estimated Creatinine Clearance: 24.1 mL/min (A) (by C-G formula based on SCr of 1.3 mg/dL (H)). Liver Function Tests: Recent Labs  Lab 12-30-19 1206  AST 31  ALT 15  ALKPHOS 60  BILITOT 1.0  PROT 7.5  ALBUMIN 3.7   No results for input(s): LIPASE, AMYLASE in the last 168 hours. No results for input(s): AMMONIA in the last 168 hours. Coagulation Profile: Recent Labs  Lab 2019/12/30 1206  INR 5.4*   Cardiac Enzymes: No results for input(s): CKTOTAL, CKMB, CKMBINDEX, TROPONINI in the last 168 hours. BNP (last 3 results) No results for input(s): PROBNP in the last 8760 hours. HbA1C: No results for input(s): HGBA1C in the last 72 hours. CBG: No results for input(s): GLUCAP in the last 168 hours. Lipid Profile: No results for input(s): CHOL, HDL, LDLCALC, TRIG, CHOLHDL, LDLDIRECT in the last 72 hours. Thyroid Function Tests: No results for input(s): TSH, T4TOTAL, FREET4, T3FREE, THYROIDAB in the last 72 hours. Anemia Panel: No results for input(s): VITAMINB12, FOLATE, FERRITIN, TIBC, IRON, RETICCTPCT in the last 72 hours. Urine analysis: No results found for: COLORURINE, APPEARANCEUR, LABSPEC, PHURINE, GLUCOSEU, HGBUR, BILIRUBINUR, KETONESUR, PROTEINUR, UROBILINOGEN, NITRITE, LEUKOCYTESUR Sepsis Labs: @LABRCNTIP (procalcitonin:4,lacticidven:4) )No results found for this or any previous visit (from the past 240 hour(s)).   Radiological Exams on Admission: DG Chest Portable 1 View  Result Date: 2019-12-30 CLINICAL DATA:  Cough, weakness, COVID-19 positive EXAM: PORTABLE CHEST 1 VIEW COMPARISON:  None. FINDINGS: Heart size is within normal limits. Moderate right pleural effusion. Diffuse interstitial and alveolar airspace opacities throughout both lungs.  No pneumothorax. IMPRESSION: 1. Diffuse interstitial and alveolar airspace opacities throughout both lungs suggesting multifocal atypical/viral pneumonia in the setting of COVID-19 infection. 2. Moderate right pleural effusion. Electronically Signed   By: 01/03/2020 D.O.   On: 2019-12-30 12:19     EKG: Independently reviewed.  Sinus rhythm, QTC 455, LAE, nonspecific T wave change   Assessment/Plan Principal Problem:   Acute respiratory disease due to COVID-19 virus Active Problems:   Hypertension   CHF (congestive heart failure) (HCC)   Atrial fibrillation (HCC)   AKI (acute kidney injury) (HCC)   HLD (hyperlipidemia)   Hypothyroidism   Acute respiratory disease due to COVID-19 virus: Patient had oxygen desaturation to 60s on room air, improved to 96% on 6 L nasal cannula oxygen.  Chest x-ray showed bilateral interstitial infiltration.  -will admit to tele bed as inpt -Remdesivir per pharm -Solumedrol 40 mg bid -vitamin C, zinc.  -Bronchodilators -PRN Mucinex for cough -f/u Blood culture -D-dimer, BNP,Trop, LFT, CRP, LDH, Procalcitonin, Ferritin, fibinogen, TG, Hep B SAg, HIV ab -Daily CRP,  Ferritin, D-dimer, -Will ask the patient to maintain an awake prone position for 16+ hours a day, if possible, with a minimum of 2-3 hours at a time -Will attempt to maintain euvolemia to a net negative fluid status -IF patient deteriorates, will consult PCCM and ID  HTN:  -Continue home medications: Metoprolol -hydralazine prn  CHF (congestive heart failure) ([place]): no 2D echo on record not sure which type of CHF.  Given long history of hypertension, patient may have diastolic CHF.  No leg edema or JVD.  CHF seem to be compensated. -Hold Lasix due to AKI -Check BMP  Hx of  Atrial fibrillation [facility]): CHA2DS2-VASc Score is 5, needs oral anticoagulation. Patient is on Coumadin at home. INR is 5.4 on admission. No bleeding. Heart rate is 90-100s. -Continue metoprolol and  amiodarone -Coumadin dose adjustment per pharmacist  Possible AKI: Creatinine 1.3, BUN 21, no baseline creatinine available. -Hold Lasix -Follow-up by BMP  HLD (hyperlipidemia): -Pravastatin  Hypothyroidism -Synthroid     Inpatient status:  # Patient requires inpatient status due to high intensity of service, high risk for further deterioration and high frequency of surveillance required.  I certify that at the point of admission it is my clinical judgment that the patient will require inpatient hospital care spanning beyond 2 midnights from the point of admission.  . This patient has multiple chronic comorbidities including hypertension, hyperlipidemia, hypothyroidism, depression, atrial fibrillation on Coumadin, CHF . Now patient has presenting with acute respiratory disease due to COVID-19 virus with hypoxia . The initial radiographic and laboratory data are worrisome because of positive Covid Ag test, AKI, chest x-ray showed bilateral interstitial infiltration, elevated INR 5.4. Marland Kitchen Current medical needs: please see my assessment and plan . Predictability of an adverse outcome (risk):  Patient has multiple comorbidities as listed above. Now presents with acute respiratory disease due to COVID-19 virus with hypoxia. Also has AKI and elevated INR at 5.4. Patient's presentation is highly complicated.  Patient is at high risk of deteriorating.  Will need to be treated in hospital for at least 2 days.           DVT ppx: on Coumadin Code Status: Full code Family Communication: [place] daughter by phone Disposition Plan:  Anticipate discharge back to previous SNF Consults called:  none Admission status: Tele bed for as inpt        Date of Service 12/15/2019    [place] [facility]   If 7PM-7AM, please contact night-coverage [email or web address] Password [email or web address] 12/16/2019, 1:49 PM

## 2019-12-24 NOTE — ED Triage Notes (Signed)
Pt to ER via EMS from [facility],  Diagnosed with CoVid on 12/20/19 on 2L [place] since that time.  Today staff found pt with sats in 60s, increased to 4L improved to 80s, now on 6L [place] sats now 97%.  Pt reports productive cough and states had a CXR today, but no report available at this time.

## 2019-12-24 NOTE — Consult Note (Signed)
ANTICOAGULATION CONSULT NOTE - Follow Up Consult  Pharmacy Consult for Warfarin Indication: atrial fibrillation  No Known Allergies  Patient Measurements: Height: 5\' 5"  (165.1 cm) Weight: 160 lb (72.6 kg) IBW/kg (Calculated) : 57  Vital Signs: Temp: 99.3 F (37.4 C) (01/31 1157) Temp Source: Oral (01/31 1157) BP: 148/64 (01/31 1300) Pulse Rate: 92 (01/31 1300)  Labs: Recent Labs    12/14/2019 1206  HGB 10.8*  HCT 34.6*  PLT 175  LABPROT 49.7*  INR 5.4*  CREATININE 1.30*    Estimated Creatinine Clearance: 24.1 mL/min (A) (by C-G formula based on SCr of 1.3 mg/dL (H)).   Medications:  Warfarin 3 mg alternating with 3.5 mg every evening with last dose of 3.5 mg on 1/30 per nursing home [facility]  Assessment: Patient is a 86 y/o F with a past medical history including hypertension, congestive heart failure, atrial fibrillation on warfarin, hyperlipidemia, hypothyroidism who was admitted to [facility] 1/31 with acute respiratory disease secondary to COVID-19 infection. Pharmacy has been consulted for warfarin dosing.   Baseline Hgb of 10.8 reflects anemia. Un-clear true baseline. Plt 175 are at lower limit of normal. No indication of bleeding per MD.   1/31 INR 5.4, supratherapeutic  Goal of Therapy:  INR 2-3  Plan:  -Hold warfarin tonight -Re-check INR tomorrow  2/31  Pharmacy Resident 12/16/2019,1:25 PM

## 2019-12-24 NOTE — Plan of Care (Signed)
  Problem: Education: Goal: Knowledge of risk factors and measures for prevention of condition will improve 2020/01/15 2251 by Mertha Baars, RN Outcome: Progressing January 15, 2020 2251 by Mertha Baars, RN Outcome: Progressing

## 2019-12-24 NOTE — Consult Note (Signed)
Remdesivir - Pharmacy Brief Note   O:  ALT: 15 CXR: Impression of: "Diffuse interstitial and alveolar airspace opacities throughout both lungs suggesting multifocal atypical/viral pneumonia in the setting of COVID-19 infection." SpO2: Hypoxic requiring 6L [place]   A/P:  1/31 POC SARS-CoV-2 Antigen + and reportedly positive since 1/27  Remdesivir 200 mg IVPB once followed by 100 mg IVPB daily x 4 days.   Dorothea Ogle Pharmacy Resident 12/25/19 1:17 PM

## 2019-12-24 NOTE — H&P (Addendum)
History and Physical    Renee Estrada OEV:[id number] DOB: February 02, 1919 DOA: 2019/12/28  PCP: Angela Cox, MD   Patient coming from: [facility]   Chief Complaint: Hypoxia, recent COVID diagnosis   HPI: Renee Estrada is a 86 y.o. female with medical history significant for chronic CHF, atrial fibrillation on warfarin, hypothyroidism, hyperlipidemia, and recent COVID-19 diagnosis, started on 2 L/min of supplemental oxygen at her living facility recently, and now presenting to the ED with oxygen saturation in the 60s despite the 2 L/min supplemental O2. Patient reports being surprised when she tested positive for COVID several days ago as she felt fine at the time but has since developed a cough and diarrhea. She has not felt particularly short of breath and denies any chest pain or abdominal pain. She has noted scant amount of blood in stool previously but none with the recent loose stools and no other bleeding.   [facility] ED Course: Upon arrival to the ED, patient is found to be afebrile, requiring 6 L/min of supplemental oxygen to maintain saturations in the 90s, mildly tachypneic, and with blood pressure 110/45.  EKG with sinus rhythm and chest x-ray notable for diffuse interstitial and alveolar airspace opacities throughout the lungs bilaterally as well as moderate right pleural effusion.  Chemistry panel features a creatinine of 1.30 with no prior labs available for comparison.  CBC notable for hemoglobin 10.8.  Lactic acid normal, INR 5.4, procalcitonin undetectable, and Covid antigen positive.  Blood cultures were collected in the ED, the patient was started on systemic steroid and remdesivir, and she was transferred to [facility] for ongoing evaluation and management.  Review of Systems:  All other systems reviewed and apart from HPI, are negative.  Past Medical History:  Diagnosis Date  . Atrial fibrillation (HCC)   . Atrial fibrillation (HCC)   . CHF (congestive  heart failure) (HCC)   . Depression   . HLD (hyperlipidemia)   . Hypertension   . Hypothyroidism     History reviewed. No pertinent surgical history.   reports that she has never smoked. She has never used smokeless tobacco. She reports that she does not drink alcohol or use drugs.  Allergies  Allergen Reactions  . Codeine     Family History  Problem Relation Age of Onset  . Breast cancer Sister      Prior to Admission medications   Medication Sig Start Date End Date Taking? Authorizing Provider  acetaminophen (TYLENOL) 500 MG tablet Take 1,000 mg by mouth 3 (three) times daily.    [provider]  amiodarone (PACERONE) 100 MG tablet Take 100 mg by mouth daily.    [provider]  ascorbic acid (VITAMIN C) 500 MG tablet Take 500 mg by mouth daily.    [provider]  calcium-vitamin D (OSCAL-500) 500-400 MG-UNIT tablet Take 1 tablet by mouth 2 (two) times daily.    [provider]  cholecalciferol (VITAMIN D3) 25 MCG (1000 UNIT) tablet Take 1,000 Units by mouth daily.    [provider]  dorzolamide (TRUSOPT) 2 % ophthalmic solution Place 1 drop into both eyes 3 (three) times daily.    [provider]  escitalopram (LEXAPRO) 10 MG tablet Take 10 mg by mouth daily.    [provider]  furosemide (LASIX) 40 MG tablet Take 60 mg by mouth daily.    [provider]  guaiFENesin (ROBITUSSIN) 100 MG/5ML SOLN Take 30 mLs by mouth every 4 (four) hours as needed for  cough or to loosen phlegm.    [provider]  Infant Care Products [facility] EX) Apply topically 3 (three) times daily.    [provider]  ketoconazole (NIZORAL) 2 % cream Apply 1 application topically daily. Perineal area for area    [provider]  levothyroxine (SYNTHROID) 75 MCG tablet Take 75 mcg by mouth daily.    [provider]  Melatonin 3 MG TABS Take 6 mg by mouth at bedtime.    [provider]    menthol-cetylpyridinium (CEPACOL) 3 MG lozenge Take 1 lozenge by mouth 3 (three) times daily. 12/20/19 12/25/19  [provider]  metoprolol succinate (TOPROL-XL) 50 MG 24 hr tablet Take 50 mg by mouth daily. Take with or immediately following a meal.    [provider]  Multiple Vitamin (MULTIVITAMIN WITH MINERALS) TABS tablet Take 1 tablet by mouth daily.    [provider]  nystatin (NYSTATIN) powder Apply 1 application topically at bedtime. To vaginal area, for rash    [provider]  potassium chloride SA (KLOR-CON) 20 MEQ tablet Take 20 mEq by mouth daily.    [provider]  pravastatin (PRAVACHOL) 80 MG tablet Take 80 mg by mouth at bedtime.    [provider]  sodium chloride (OCEAN) 0.65 % SOLN nasal spray Place 2 sprays into both nostrils 3 (three) times daily.    [provider]  warfarin (COUMADIN) 1 MG tablet Take 0.5 mg by mouth every other day. In the evening    [provider]  warfarin (COUMADIN) 3 MG tablet Take 3 mg by mouth every evening.    [provider]  zinc gluconate 50 MG tablet Take 50 mg by mouth daily.    [provider]    Physical Exam: Vitals:   12/13/2019 1937  BP: (!) 149/60  Pulse: 86  Resp: 19  Temp: 98.8 F (37.1 C)  TempSrc: Oral  SpO2: 91%     Constitutional: NAD, calm  Eyes: PERTLA, lids and conjunctivae normal ENMT: Mucous membranes are dry. Posterior pharynx clear of any exudate or lesions.   Neck: normal, supple, no masses, no thyromegaly Respiratory: speaking full sentences though mild dyspnea with speech, no wheezing. No pallor.  Cardiovascular: S1 & S2 heard, regular rate and rhythm. No extremity edema.   Abdomen: No distension, no tenderness, soft. Bowel sounds active.  Musculoskeletal: no clubbing / cyanosis. No joint deformity upper and lower extremities.   Skin: no significant rashes, lesions, ulcers. Warm, dry, well-perfused. Neurologic: No  gross facial asymmetry. Sensation intact. Moving all extremities.  Psychiatric: Alert and oriented to person, place, and situation. Very pleasant and cooperative.    Labs and Imaging on Admission: I have personally reviewed following labs and imaging studies  CBC: Recent Labs  Lab 12/18/2019 1206  WBC 5.4  NEUTROABS 4.2  HGB 10.8*  HCT 34.6*  MCV 97.2  PLT 660   Basic Metabolic Panel: Recent Labs  Lab 12/18/2019 1206  NA 140  K 4.2  CL 101  CO2 26  GLUCOSE 141*  BUN 21  CREATININE 1.30*  CALCIUM 9.0   GFR: Estimated Creatinine Clearance: 24.1 mL/min (A) (by C-G formula based on SCr of 1.3 mg/dL (H)). Liver Function Tests: Recent Labs  Lab 12/08/2019 1206  AST 31  ALT 15  ALKPHOS 60  BILITOT 1.0  PROT 7.5  ALBUMIN 3.7   No results for input(s): LIPASE, AMYLASE in the last 168 hours. No results for input(s): AMMONIA in the  last 168 hours. Coagulation Profile: Recent Labs  Lab 31-Dec-2019 1206  INR 5.4*   Cardiac Enzymes: No results for input(s): CKTOTAL, CKMB, CKMBINDEX, TROPONINI in the last 168 hours. BNP (last 3 results) No results for input(s): PROBNP in the last 8760 hours. HbA1C: No results for input(s): HGBA1C in the last 72 hours. CBG: No results for input(s): GLUCAP in the last 168 hours. Lipid Profile: No results for input(s): CHOL, HDL, LDLCALC, TRIG, CHOLHDL, LDLDIRECT in the last 72 hours. Thyroid Function Tests: No results for input(s): TSH, T4TOTAL, FREET4, T3FREE, THYROIDAB in the last 72 hours. Anemia Panel: Recent Labs    2019/12/31 1457  FERRITIN 341*   Urine analysis: No results found for: COLORURINE, APPEARANCEUR, LABSPEC, PHURINE, GLUCOSEU, HGBUR, BILIRUBINUR, KETONESUR, PROTEINUR, UROBILINOGEN, NITRITE, LEUKOCYTESUR Sepsis Labs: @LABRCNTIP (procalcitonin:4,lacticidven:4) )No results found for this or any previous visit (from the past 240 hour(s)).   Radiological Exams on Admission: DG Chest Portable 1 View  Result Date:  12/31/19 CLINICAL DATA:  Cough, weakness, COVID-19 positive EXAM: PORTABLE CHEST 1 VIEW COMPARISON:  None. FINDINGS: Heart size is within normal limits. Moderate right pleural effusion. Diffuse interstitial and alveolar airspace opacities throughout both lungs. No pneumothorax. IMPRESSION: 1. Diffuse interstitial and alveolar airspace opacities throughout both lungs suggesting multifocal atypical/viral pneumonia in the setting of COVID-19 infection. 2. Moderate right pleural effusion. Electronically Signed   By: 01/04/2020 D.O.   On: Dec 31, 2019 12:19    EKG: Independently reviewed. Sinus rhythm.   Assessment/Plan    ADDENDUM (00:50):  SBP down to 90s-low 100's overnight and 250 cc NS bolus given. Will hold Lasix and beta-blocker for now.    1. Pneumonia secondary to COVID-19; acute hypoxic respiratory failure  - Diagnosed with COVID-19 a few days prior to admission and was started on 2 Lpm supplemental O2, became hypoxic with saturation in 60's on 2 Lpm and presented to ED Dec 31, 2019  - CXR findings consistent with viral PNA, saturating 91% on 8 Lpm with RR 19 on arrival to [facility]  - She was started on steroids and remdesivir at [facility]  - Continue steroids and remdesivir, titrate FiO2 to keep sat 90% or greater while awake, prone as tolerated, trend markers    2. Chronic CHF; right pleural effusion  - No echo report on file/EF unknown   - Moderate right pleural effusion noted on CXR in ED  - She is having loose stools and was given 500 cc NS bolus prior to arrival at [facility]   - She does not appear hypervolemic but O2-requirement increased after the NS bolus that she received pta  - Plan to continue oral Lasix, SLIV, and follow daily weights, I/O's will be difficult with diarrhea   3. Renal insufficiency  - SCr is 1.30 in ED with no prior labs available for comparison  - Renally-dose medications, monitor   4. Atrial fibrillation; supratherapeutic INR  - In sinus rhythm on admission  -  CHADS-VASc at least 3 (age x2, gender, CHF) - INR is 5.4 in ED withou bleeding  - Continue amiodarone, continue metoprolol with holding parameters, consult pharmacy for assistance with warfarin dosing    5. Hypothyroidism  - Continue Synthroid     DVT prophylaxis: warfarin  Code Status: Full, confirmed with patient who reports making her own decisions with help from granddaughter 10 who was not available at time of admission  Family Communication: Discussed with patient. Message left with granddaughter Delice Bison)  Consults called: None  Admission status: Inpatient  Briscoe Deutscher, MD [facility] Pager: See [email or web address]  If 7AM-7PM, please contact the daytime attending [email or web address]  12/14/2019, 7:41 PM

## 2019-12-24 NOTE — ED Provider Notes (Signed)
[facility] Emergency Department Provider Note  ____________________________________________   First MD Initiated Contact with Patient 11/27/2019 1159     (approximate)  I have reviewed the triage vital signs and the nursing notes.  EM caveat: Limited due to dyspnea and respiratory distress  HISTORY  Chief Complaint Shortness of Breath  HPI Renee Estrada is a 86 y.o. female reports she tested positive for coronavirus about 1 week ago.  She reports she was surprised then as she did not really have much of her symptoms.  However over the last day start developed fatigue no appetite and shortness of breath  Reports she called for help has been feeling short of breath and getting worse now  her last 24 hours  No pain.  She feels very fatigued.  Past Medical History:  Diagnosis Date  . Atrial fibrillation (HCC)   . CHF (congestive heart failure) (HCC)   . Hypertension     Patient Active Problem List   Diagnosis Date Noted  . Hypertension   . CHF (congestive heart failure) (HCC)   . Atrial fibrillation (HCC)   . Acute respiratory disease due to COVID-19 virus   . AKI (acute kidney injury) (HCC)     History reviewed. No pertinent surgical history.  Prior to Admission medications   Medication Sig Start Date End Date Taking? Authorizing Provider  acetaminophen (TYLENOL) 500 MG tablet Take 1,000 mg by mouth 3 (three) times daily.   Yes [provider]  amiodarone (PACERONE) 100 MG tablet Take 100 mg by mouth daily.   Yes [provider]  ascorbic acid (VITAMIN C) 500 MG tablet Take 500 mg by mouth daily.   Yes [provider]  calcium-vitamin D (OSCAL-500) 500-400 MG-UNIT tablet Take 1 tablet by mouth 2 (two) times daily.   Yes [provider]  cholecalciferol (VITAMIN D3) 25 MCG (1000 UNIT) tablet Take 1,000 Units by mouth daily.   Yes [provider]  dorzolamide (TRUSOPT) 2 % ophthalmic solution Place 1 drop  into both eyes 3 (three) times daily.   Yes [provider]  escitalopram (LEXAPRO) 10 MG tablet Take 10 mg by mouth daily.   Yes [provider]  furosemide (LASIX) 40 MG tablet Take 60 mg by mouth daily.   Yes [provider]  guaiFENesin (ROBITUSSIN) 100 MG/5ML SOLN Take 30 mLs by mouth every 4 (four) hours as needed for cough or to loosen phlegm.   Yes [provider]  Infant Care Products [facility] EX) Apply topically 3 (three) times daily.   Yes [provider]  ketoconazole (NIZORAL) 2 % cream Apply 1 application topically daily. Perineal area for area   Yes [provider]  levothyroxine (SYNTHROID) 75 MCG tablet Take 75 mcg by mouth daily.   Yes [provider]  Melatonin 3 MG TABS Take 6 mg by mouth at bedtime.   Yes [provider]  menthol-cetylpyridinium (CEPACOL) 3 MG lozenge Take 1 lozenge by mouth 3 (three) times daily. 12/20/19 12/25/19 Yes [provider]  metoprolol succinate (TOPROL-XL) 50 MG 24 hr tablet Take 50 mg by mouth daily. Take with or immediately following a meal.   Yes [provider]  Multiple Vitamin (MULTIVITAMIN WITH MINERALS) TABS tablet Take 1 tablet by mouth daily.   Yes [provider]  nystatin (NYSTATIN) powder Apply 1 application topically at bedtime. To vaginal area, for rash   Yes [provider]  potassium chloride SA (KLOR-CON) 20 MEQ tablet Take 20  mEq by mouth daily.   Yes [provider]  pravastatin (PRAVACHOL) 80 MG tablet Take 80 mg by mouth at bedtime.   Yes [provider]  sodium chloride (OCEAN) 0.65 % SOLN nasal spray Place 2 sprays into both nostrils 3 (three) times daily.   Yes [provider]  warfarin (COUMADIN) 1 MG tablet Take 0.5 mg by mouth every other day. In the evening   Yes [provider]  warfarin (COUMADIN) 3 MG tablet Take 3 mg by mouth every evening.   Yes [provider]  zinc  gluconate 50 MG tablet Take 50 mg by mouth daily.   Yes [provider]    Allergies Patient has no known allergies.  History reviewed. No pertinent family history.  Social History Social History   Tobacco Use  . Smoking status: Never Smoker  . Smokeless tobacco: Never Used  Substance Use Topics  . Alcohol use: Not on file  . Drug use: Not on file    Review of Systems Constitutional: No fever/chills Eyes: No visual changes.   Cardiovascular: Denies chest pain. Respiratory: Better now than on oxygen, was very short of breath at home Gastrointestinal: No abdominal pain.     Musculoskeletal: Negative for back pain. Skin: Negative for rash. Neurological: Negative for headaches or focal weakness, feels very fatigued and tired in general    ____________________________________________   PHYSICAL EXAM:  VITAL SIGNS: ED Triage Vitals  Enc Vitals Group     BP Jan 23, 2020 1157 (!) 164/70     Pulse Rate 2020/01/23 1157 (!) 104     Resp Jan 23, 2020 1157 (!) 27     Temp January 23, 2020 1157 99.3 F (37.4 C)     Temp Source January 23, 2020 1157 Oral     SpO2 January 23, 2020 1156 97 %     Weight 2020/01/23 1157 160 lb (72.6 kg)     Height 23-Jan-2020 1157 5\' 5"  (1.651 m)     Head Circumference --      Peak Flow --      Pain Score --      Pain Loc --      Pain Edu? --      Excl. in GC? --     Constitutional: Alert and oriented.  Appears mildly dyspneic.  Oxygen saturation the high 90s on 6 L nasal cannula. Eyes: Conjunctivae are normal. Head: Atraumatic. Nose: No congestion/rhinnorhea. Mouth/Throat: Mucous membranes are slightly dry. Neck: No stridor.  Cardiovascular: Minimally tachycardic, slightly irregular rhythm grossly normal heart sounds.  Good peripheral circulation. Respiratory: Mild use of accessory muscles.  Speaks in very short 1-2 word sentences.  Diminished lung sounds at bases bilaterally, notable crackles over the left side of chest, right upper seems clear.  No  wheezing. Gastrointestinal: Soft and nontender. No distention. Musculoskeletal: No lower extremity tenderness nor edema. Neurologic:  Normal speech and language. No gross focal neurologic deficits are appreciated.  Skin:  Skin is warm, dry and intact. No rash noted. Psychiatric: Mood and affect are normal. Speech and behavior are normal.  ____________________________________________   LABS (all labs ordered are listed, but only abnormal results are displayed)  Labs Reviewed  COMPREHENSIVE METABOLIC PANEL - Abnormal; Notable for the following components:      Result Value   Glucose, Bld 141 (*)    Creatinine, Ser 1.30 (*)    GFR calc non Af Amer 34 (*)    GFR calc Af Amer 40 (*)    All other components within normal limits  CBC  WITH DIFFERENTIAL/PLATELET - Abnormal; Notable for the following components:   RBC 3.56 (*)    Hemoglobin 10.8 (*)    HCT 34.6 (*)    RDW 16.8 (*)    Lymphs Abs 0.6 (*)    Abs Immature Granulocytes 0.21 (*)    All other components within normal limits  POC SARS CORONAVIRUS 2 AG - Abnormal; Notable for the following components:   SARS Coronavirus 2 Ag POSITIVE (*)    All other components within normal limits  CULTURE, BLOOD (ROUTINE X 2)  CULTURE, BLOOD (ROUTINE X 2)  LACTIC ACID, PLASMA  LACTIC ACID, PLASMA  PROTIME-INR  URINALYSIS, COMPLETE (UACMP) WITH MICROSCOPIC  PROCALCITONIN  PROCALCITONIN  POC SARS CORONAVIRUS 2 AG -  ED   ____________________________________________  EKG   ____________________________________________  RADIOLOGY  DG Chest Portable 1 View  Result Date: 12/23/2019 CLINICAL DATA:  Cough, weakness, COVID-19 positive EXAM: PORTABLE CHEST 1 VIEW COMPARISON:  None. FINDINGS: Heart size is within normal limits. Moderate right pleural effusion. Diffuse interstitial and alveolar airspace opacities throughout both lungs. No pneumothorax. IMPRESSION: 1. Diffuse interstitial and alveolar airspace opacities throughout both lungs  suggesting multifocal atypical/viral pneumonia in the setting of COVID-19 infection. 2. Moderate right pleural effusion. Electronically Signed   By: Davina Poke D.O.   On: 11/27/2019 12:19    Imaging studies reviewed concerning for diffuse airspace disease and right-sided pleural effusion ____________________________________________   PROCEDURES  Procedure(s) performed: None  Procedures  Critical Care performed: Yes, see critical care note(s)  CRITICAL CARE Performed by: Delman Kitten   Total critical care time: 30 minutes  Critical care time was exclusive of separately billable procedures and treating other patients.  Critical care was necessary to treat or prevent imminent or life-threatening deterioration.  Critical care was time spent personally by me on the following activities: development of treatment plan with patient and/or surrogate as well as nursing, discussions with consultants, evaluation of patient's response to treatment, examination of patient, obtaining history from patient or surrogate, ordering and performing treatments and interventions, ordering and review of laboratory studies, ordering and review of radiographic studies, pulse oximetry and re-evaluation of patient's condition.  ____________________________________________   INITIAL IMPRESSION / ASSESSMENT AND PLAN / ED COURSE  Pertinent labs & imaging results that were available during my care of the patient were reviewed by me and considered in my medical decision making (see chart for details).   Patient presents for shortness of breath.  Self reports positive Covid test, do not have documentation of this yet but working to obtain.  We will test her, reports symptoms started around Monday of last week tested positive around that time  Certainly hypoxic with EMS, has corrected with oxygen her work of breathing is mildly increased but not severe at this time.  We will continue her on oxygen therapy, await  further test including chest x-ray, procalcitonin, other test.  Certainly [id number] could explain her symptoms, but wish to have confirmatory testing    Clinical Course as of Dec 23 1257  Sun Dec 24, 2019  1242 COVID rapid POC test is POSITIVE   [MQ]    Clinical Course User Index [MQ] Delman Kitten, MD    ----------------------------------------- 12:59 PM on 11/27/2019 -----------------------------------------  Clinical history appears consistent with COVID-19 infection including chest x-ray.  Positive test here.  Also GFR is reduced, baseline not clear to me but will provide hydration carefully as the patient does also have a history of CHF.  Patient understanding plan for admission.  Tolerating 6 L nasal cannula well with minimal increased work of breathing, with temp to titrate down.  Admission discussed with Dr. Clyde Lundborg ____________________________________________   FINAL CLINICAL IMPRESSION(S) / ED DIAGNOSES  Final diagnoses:  Hypoxia  COVID-19        Note:  This document was prepared using Dragon voice recognition software and may include unintentional dictation errors       Sharyn Creamer, MD Jan 08, 2020 1300

## 2019-12-25 DIAGNOSIS — J90 Pleural effusion, not elsewhere classified: Secondary | ICD-10-CM

## 2019-12-25 DIAGNOSIS — U071 COVID-19: Principal | ICD-10-CM

## 2019-12-25 DIAGNOSIS — E039 Hypothyroidism, unspecified: Secondary | ICD-10-CM

## 2019-12-25 DIAGNOSIS — J9601 Acute respiratory failure with hypoxia: Secondary | ICD-10-CM

## 2019-12-25 DIAGNOSIS — I4891 Unspecified atrial fibrillation: Secondary | ICD-10-CM

## 2019-12-25 DIAGNOSIS — I509 Heart failure, unspecified: Secondary | ICD-10-CM

## 2019-12-25 DIAGNOSIS — J069 Acute upper respiratory infection, unspecified: Secondary | ICD-10-CM

## 2019-12-25 DIAGNOSIS — N289 Disorder of kidney and ureter, unspecified: Secondary | ICD-10-CM

## 2019-12-25 LAB — CBC WITH DIFFERENTIAL/PLATELET
Abs Immature Granulocytes: 0.26 10*3/uL — ABNORMAL HIGH (ref 0.00–0.07)
Basophils Absolute: 0 10*3/uL (ref 0.0–0.1)
Basophils Relative: 1 %
Eosinophils Absolute: 0 10*3/uL (ref 0.0–0.5)
Eosinophils Relative: 0 %
HCT: 31.5 % — ABNORMAL LOW (ref 36.0–46.0)
Hemoglobin: 10 g/dL — ABNORMAL LOW (ref 12.0–15.0)
Immature Granulocytes: 8 %
Lymphocytes Relative: 13 %
Lymphs Abs: 0.4 10*3/uL — ABNORMAL LOW (ref 0.7–4.0)
MCH: 30.9 pg (ref 26.0–34.0)
MCHC: 31.7 g/dL (ref 30.0–36.0)
MCV: 97.2 fL (ref 80.0–100.0)
Monocytes Absolute: 0.1 10*3/uL (ref 0.1–1.0)
Monocytes Relative: 4 %
Neutro Abs: 2.4 10*3/uL (ref 1.7–7.7)
Neutrophils Relative %: 74 %
Platelets: 174 10*3/uL (ref 150–400)
RBC: 3.24 MIL/uL — ABNORMAL LOW (ref 3.87–5.11)
RDW: 16.9 % — ABNORMAL HIGH (ref 11.5–15.5)
WBC: 3.2 10*3/uL — ABNORMAL LOW (ref 4.0–10.5)
nRBC: 0 % (ref 0.0–0.2)

## 2019-12-25 LAB — PROTIME-INR
INR: 6.9 (ref 0.8–1.2)
Prothrombin Time: 59.6 seconds — ABNORMAL HIGH (ref 11.4–15.2)

## 2019-12-25 LAB — COMPREHENSIVE METABOLIC PANEL
ALT: 12 U/L (ref 0–44)
AST: 20 U/L (ref 15–41)
Albumin: 3.1 g/dL — ABNORMAL LOW (ref 3.5–5.0)
Alkaline Phosphatase: 49 U/L (ref 38–126)
Anion gap: 10 (ref 5–15)
BUN: 26 mg/dL — ABNORMAL HIGH (ref 8–23)
CO2: 25 mmol/L (ref 22–32)
Calcium: 8.2 mg/dL — ABNORMAL LOW (ref 8.9–10.3)
Chloride: 105 mmol/L (ref 98–111)
Creatinine, Ser: 1.18 mg/dL — ABNORMAL HIGH (ref 0.44–1.00)
GFR calc Af Amer: 44 mL/min — ABNORMAL LOW (ref >60)
GFR calc non Af Amer: 38 mL/min — ABNORMAL LOW (ref >60)
Glucose, Bld: 153 mg/dL — ABNORMAL HIGH (ref 70–99)
Potassium: 3.8 mmol/L (ref 3.5–5.1)
Sodium: 140 mmol/L (ref 135–145)
Total Bilirubin: 0.8 mg/dL (ref 0.3–1.2)
Total Protein: 6.6 g/dL (ref 6.5–8.1)

## 2019-12-25 LAB — FERRITIN: Ferritin: 321 ng/mL — ABNORMAL HIGH (ref 11–307)

## 2019-12-25 LAB — D-DIMER, QUANTITATIVE: D-Dimer, Quant: 0.61 ug/mL-FEU — ABNORMAL HIGH (ref 0.00–0.50)

## 2019-12-25 LAB — ABO/RH: ABO/RH(D): A POS

## 2019-12-25 LAB — MRSA PCR SCREENING: MRSA by PCR: POSITIVE — AB

## 2019-12-25 LAB — C-REACTIVE PROTEIN: CRP: 13.7 mg/dL — ABNORMAL HIGH (ref <1.0)

## 2019-12-25 LAB — MAGNESIUM: Magnesium: 2.1 mg/dL (ref 1.7–2.4)

## 2019-12-25 MED ORDER — FUROSEMIDE 10 MG/ML IJ SOLN
40.0000 mg | Freq: Once | INTRAMUSCULAR | Status: AC
  Administered 2019-12-25: 40 mg via INTRAVENOUS
  Filled 2019-12-25: qty 4

## 2019-12-25 MED ORDER — SODIUM CHLORIDE 0.9% IV SOLUTION: Freq: Once | INTRAVENOUS | Status: AC

## 2019-12-25 MED ORDER — FUROSEMIDE 10 MG/ML IJ SOLN
60.0000 mg | Freq: Once | INTRAMUSCULAR | Status: AC
  Administered 2019-12-25: 22:00:00 20 mg via INTRAVENOUS
  Filled 2019-12-25: qty 6

## 2019-12-25 MED ORDER — VITAMIN K1 10 MG/ML IJ SOLN
5.0000 mg | Freq: Once | INTRAVENOUS | Status: AC
  Administered 2019-12-25: 11:00:00 5 mg via INTRAVENOUS
  Filled 2019-12-25: qty 0.5

## 2019-12-25 MED ORDER — ORAL CARE MOUTH RINSE
15.0000 mL | Freq: Two times a day (BID) | OROMUCOSAL | Status: DC
  Administered 2019-12-25 – 2019-12-26 (×2): 15 mL via OROMUCOSAL

## 2019-12-25 MED ORDER — ASPIRIN 81 MG PO CHEW
324.0000 mg | CHEWABLE_TABLET | Freq: Once | ORAL | Status: DC
  Filled 2019-12-25: qty 4

## 2019-12-25 MED ORDER — SODIUM CHLORIDE 0.9 % IV SOLN: Freq: Once | INTRAVENOUS | Status: AC

## 2019-12-25 MED ORDER — MORPHINE SULFATE (PF) 2 MG/ML IV SOLN
0.5000 mg | INTRAVENOUS | Status: DC | PRN
  Filled 2019-12-25: qty 1

## 2019-12-25 MED ORDER — TOCILIZUMAB 400 MG/20ML IV SOLN
600.0000 mg | Freq: Once | INTRAVENOUS | Status: AC
  Administered 2019-12-25: 12:00:00 600 mg via INTRAVENOUS
  Filled 2019-12-25: qty 10

## 2019-12-25 MED ORDER — FUROSEMIDE 20 MG PO TABS: 60.0000 mg | ORAL_TABLET | Freq: Every day | ORAL | Status: DC

## 2019-12-25 MED ORDER — MORPHINE SULFATE (PF) 2 MG/ML IV SOLN
1.0000 mg | Freq: Once | INTRAVENOUS | Status: AC
  Administered 2019-12-25: 1 mg via INTRAVENOUS

## 2019-12-25 MED ORDER — LIP MEDEX EX OINT
TOPICAL_OINTMENT | CUTANEOUS | Status: DC | PRN
  Administered 2019-12-25: 1 via TOPICAL
  Filled 2019-12-25: qty 7

## 2019-12-25 MED ORDER — HYDROCOD POLST-CPM POLST ER 10-8 MG/5ML PO SUER
5.0000 mL | Freq: Two times a day (BID) | ORAL | Status: DC
  Administered 2019-12-25 (×2): 5 mL via ORAL
  Filled 2019-12-25 (×2): qty 5

## 2019-12-25 MED ORDER — PHENOL 1.4 % MT LIQD
1.0000 | OROMUCOSAL | Status: DC | PRN
  Administered 2019-12-25: 1 via OROMUCOSAL
  Filled 2019-12-25: qty 177

## 2019-12-25 NOTE — Progress Notes (Signed)
   12/25/19 1204  Family/Significant Other Communication  Family/Significant Other Update Called (Granddaughter Delice Bison. VM left with CB number)

## 2019-12-25 NOTE — Progress Notes (Addendum)
Pt BP 91/45. MD notified, orders received for 250 ml NS bolus. Will continue to monitor.  0122 Pt BP 136/77. MD notified of pt's BP, only 100 mls of 250 ml bolus infused. Orders received to hold the rest of infusion. Will continue to monitor.

## 2019-12-25 NOTE — Progress Notes (Addendum)
Mrs. Clinkscales was admitted last night with COVID pneumonia and acute hypoxic respiratory failure.   She had increase in supplemental O2 requirement from 10 Lpm HFNC to 15 Lpm + NRB.  She also complained of vague chest discomfort.    On initial assessment, she appeared slightly anxious but without pallor or diaphoresis, RR 20, HR 80's, SBP 140's.    While laying flat for EKG, she acutely worsened with diaphoresis and desaturation to 70's.   EKG reviewed and no acute ischemic features noted.   She has hx of CHF on Lasix at home but BP had been marginal last night, she received IVF, and Lasix was held.   We sat her up and administered Lasix 60 mg IV. She was deep suctioned with return of pink frothy sputum.   She improved with these interventions.   The patient is fully oriented and we discussed code status. She indicated that she would not want [place] to perform heroic measures such as CPR or intubation. We will continue the current treatments with goal of getting her through this illness but honor her wishes by avoiding heroic measures.

## 2019-12-25 NOTE — Progress Notes (Signed)
Patient's granddaughter Floy Sabina was updated by phone of tonight's events and patient's decision to be DNR/DNI. Ms. Marilynne Halsted was appreciative of the update and the care that is being provided and supports the patient's decision regarding code status.

## 2019-12-25 NOTE — Progress Notes (Signed)
RT called to see pt d/t desat. Pt in 70s on arrival.  RN helped to lift pt in bed and sats came up to 90s and RT w/ RN NTS pt and removed a moderate amount of bloody tan secretions. Pt was alert and aware but very drowsy.  Pt answered questions appropriately and spoke with RN and MD about code status.  RT will continue to monitor.

## 2019-12-25 NOTE — Progress Notes (Addendum)
PROGRESS NOTE    Renee Estrada  KLK:[id number] DOB: [birth year] DOA: 12/20/2019 PCP: Angela Cox, MD    Brief Narrative:  86 year old female who presented with dyspnea.  She does have significant past medical history for heart failure, paroxysmal atrial fibrillation, hypothyroidism and dyslipidemia.  Patient lives at assisted living facility, where she tested positive for COVID-19 a few days prior to hospitalization.  Her symptoms consisted mainly in cough and diarrhea.  She was placed on 2 L of supplemental oxygen as an outpatient.  On the day of admission her oximetry was down to 60% despite supplemental oxygen and she was brought to the emergency department.  On her initial physical examination her oximetry was 90% on 6 L/min per nasal cannula, blood pressure 149/60, heart rate 86, respiratory rate 19, temperature 98.8 F, her lungs had no wheezing or rails, heart S1-S2 present rhythmic, soft abdomen, no lower extremity edema. Sodium 140, potassium 4.2, chloride 101, bicarb 26, glucose 141, BUN 21, creatinine 1.30, white count 5.4, hemoglobin 10.8, hematocrit 34.6, platelets 175, INR 5.4, SARS COVID-19 positive.  Her chest radiograph had diffuse bilateral infiltrates, with reticular nodule left upper lobe and right lower lobe, interstitial right upper lobe and left lower lobe, bilateral pleural effusions more right than left.  EKG 98 bpm, normal axis, normal intervals, sinus rhythm, ST depressions V4 to V6, no significant T wave normalities.  Patient was admitted to the hospital with working diagnosis of acute hypoxic respiratory failure due to SARS COVID-19 viral pneumonia.  Assessment & Plan:   Principal Problem:   Acute respiratory disease due to COVID-19 virus Active Problems:   CHF (congestive heart failure) (HCC)   Atrial fibrillation (HCC)   Renal insufficiency   Hypothyroidism   Pleural effusion, right   Normocytic anemia   Acute hypoxemic respiratory failure due to  COVID-19 (HCC)   1.  Acute hypoxic respiratory failure due to SARS COVID-19 viral pneumonia/ bilateral pleural effusions/ R>L, parapneumonic.  RR: 20  Pulse oxymetry: 93%  Fi02: 8 L/ min per HFNC   COVID-19 Labs  Recent Labs    11/27/2019 1457 12/25/19 0312  DDIMER  --  0.61*  FERRITIN 341* 321*  LDH 289*  --   CRP 11.8* 13.7*    No results found for: SARSCOV2NAA  In the setting of worsening hypoxic respiratory failure, I explained the patient in detail, the use of advanced therapies: Tocilizumab and COVID 19 convalescent plasma.  I informed her the risks of this interventions including immunosuppression, opportunistic infections, gastrointestinal complications, allergic reactions, volume overload and others.  I explained her the experimental nature of this interventions, in the face of current COVID 19 pandemic, along with the potential benefits of improvement of hypoxic respiratory failure and systemic inflammation.  She has agreed to proceed and consent was signed.  Continue medical therapy with Remdesivir #2/5 and systemic corticosteroids with dexamethasone, 6 mg Iv q 24 H. On antitussive agents, bronchodilators and airway clearing techniques with flutter valve and incetive spirometer.   Continue to follow inflammatory markers.  Patient is critically ill with very high risk of worsening respiratory failure.   2. Paroxysmal atrial fibrillation with supra-therapeutic INR. Patient with very high risk of bleeding, will continue to hold warfarin and will give 5 units of vitamin K IV, follow INR in am. Fall precautions. Continue metoprolol and amiodarone for rate and rhythm control and continue telemetry monitoring.   3. Chronic diastolic heart failure. No signs of acute decompensation, will continue close monitor of fluid balance,  hold on furosemide for now. Patient had bolus IV fluids on admission.  4. AKI. Renal function with serum cr at 1,1 with K at 3,8 and serum bicarbonate at  25. Will follow on renal panel in am, avoid hypotension and nephrotoxic medications.   5. Hypothyroid. Continue with levothyroxine.   6. Dyslipidemia. Continue with pravastatin.    DVT prophylaxis: high INR/ scd   Code Status: full Family Communication: I spoke over the phone with the patient's granddaughter about patient's  condition, plan of care, prognosis and all questions were addressed. Disposition Plan/ discharge barriers: critically ill    Subjective: Patient continue to have dyspnea at rest and worse with movement, continue to have cough, but no chest pain, no nausea or vomiting.  Objective: Vitals:   12/25/19 0120 12/25/19 0122 12/25/19 0303 12/25/19 0847  BP: 118/76 136/77 127/62 119/70  Pulse: 84 86 74 95  Resp: 19  20   Temp:   (!) 97.3 F (36.3 C)   TempSrc:   Oral   SpO2:  92% 93%   Weight:      Height:        Intake/Output Summary (Last 24 hours) at 12/25/2019 0852 Last data filed at 12/25/2019 0850 Gross per 24 hour  Intake 223 ml  Output --  Net 223 ml   Filed Weights   January 15, 2020 2245  Weight: 72.5 kg    Examination:   General: positive dyspnea, deconditioned and ill looking appearing  Neurology: Awake and alert, non focal  E ENT: mild pallor, no icterus, oral mucosa moist Cardiovascular: No JVD. S1-S2 present, rhythmic, no gallops, rubs, or murmurs. No lower extremity edema. Pulmonary: positive breath sounds bilaterally, decerased air movement, scattered rales. Gastrointestinal. Abdomen with no organomegaly, non tender, no rebound or guarding Skin. No rashes Musculoskeletal: no joint deformities     Data Reviewed: I have personally reviewed following labs and imaging studies  CBC: Recent Labs  Lab 01-15-20 1206 12/25/19 0312  WBC 5.4 3.2*  NEUTROABS 4.2 2.4  HGB 10.8* 10.0*  HCT 34.6* 31.5*  MCV 97.2 97.2  PLT 175 299   Basic Metabolic Panel: Recent Labs  Lab 01-15-2020 1206 12/25/19 0312  NA 140 140  K 4.2 3.8  CL 101 105   CO2 26 25  GLUCOSE 141* 153*  BUN 21 26*  CREATININE 1.30* 1.18*  CALCIUM 9.0 8.2*  MG  --  2.1   GFR: Estimated Creatinine Clearance: 26.6 mL/min (A) (by C-G formula based on SCr of 1.18 mg/dL (H)). Liver Function Tests: Recent Labs  Lab 01-15-20 1206 12/25/19 0312  AST 31 20  ALT 15 12  ALKPHOS 60 49  BILITOT 1.0 0.8  PROT 7.5 6.6  ALBUMIN 3.7 3.1*   No results for input(s): LIPASE, AMYLASE in the last 168 hours. No results for input(s): AMMONIA in the last 168 hours. Coagulation Profile: Recent Labs  Lab 01-15-20 1206 12/25/19 0312  INR 5.4* 6.9*   Cardiac Enzymes: No results for input(s): CKTOTAL, CKMB, CKMBINDEX, TROPONINI in the last 168 hours. BNP (last 3 results) No results for input(s): PROBNP in the last 8760 hours. HbA1C: No results for input(s): HGBA1C in the last 72 hours. CBG: No results for input(s): GLUCAP in the last 168 hours. Lipid Profile: No results for input(s): CHOL, HDL, LDLCALC, TRIG, CHOLHDL, LDLDIRECT in the last 72 hours. Thyroid Function Tests: No results for input(s): TSH, T4TOTAL, FREET4, T3FREE, THYROIDAB in the last 72 hours. Anemia Panel: Recent Labs    15-Jan-2020 1457 12/25/19  9507  FERRITIN 341* 321*      Radiology Studies: I have reviewed all of the imaging during this hospital visit personally     Scheduled Meds: . amiodarone  100 mg Oral Daily  . dexamethasone (DECADRON) injection  6 mg Intravenous Q24H  . dorzolamide  1 drop Both Eyes TID  . escitalopram  10 mg Oral Daily  . ketoconazole  1 application Topical Daily  . levothyroxine  75 mcg Oral Daily  . liver oil-zinc oxide   Topical TID  . Melatonin  6 mg Oral QHS  . metoprolol succinate  50 mg Oral Daily  . nystatin  1 application Topical QHS  . pravastatin  80 mg Oral q1800  . sodium chloride flush  3 mL Intravenous Q12H  . sodium chloride flush  3 mL Intravenous Q12H   Continuous Infusions: . sodium chloride    . phytonadione (VITAMIN K) IV    .  remdesivir 100 mg in NS 100 mL       LOS: 1 day        Dirk Vanaman Annett Gula, MD

## 2019-12-25 NOTE — Progress Notes (Signed)
Rapid called due to pt complaints of continued chest pain along with O2 saturation sustained in the 70s and low 80s. Charge nurse and MD Opyd were both notified. Medications provided as ordered by MD during rapid response. Pt was also suctioned and O2 saturation increased to 92 on HFNC and Norebreather mask at 30L. Per MD Opyd to continue to monitor pt for now. Pt resting will continue to monitor.

## 2019-12-25 NOTE — Progress Notes (Addendum)
CRITICAL VALUE ALERT  Critical Value:  INR 6.3  Date & Time Notied:  12/25/2019 0806  Provider Notified: Primary RN Gerlean Ren and M. Arrien MD  Orders Received/Actions taken: orders placed for 5mg  vit k

## 2019-12-25 NOTE — Progress Notes (Addendum)
Patient requiring 30L of O2. MD made aware, currently receiving plasma.  Late entry: NOC nurse received call from MD regarding patient condition after this nurse left.   12/25/19 1909  Provider Notification  Provider Name/Title T. Opyd, MD  Date Provider Notified 12/25/19  Time Provider Notified [phone]  Notification Type Page  Notification Reason  (Increased O2 demand)  Response Other (Comment) (awaiting response)

## 2019-12-25 NOTE — Progress Notes (Signed)
ANTICOAGULATION CONSULT NOTE - Initial Consult  Pharmacy Consult for Warfarin Indication: atrial fibrillation  Allergies  Allergen Reactions  . Codeine     Patient Measurements: Height: 5\' 5"  (165.1 cm) Weight: 159 lb 13.3 oz (72.5 kg) IBW/kg (Calculated) : 57 Heparin Dosing Weight: 71.6 kg  Vital Signs: Temp: 97.3 F (36.3 C) (02/01 0303) Temp Source: Oral (02/01 0303) BP: 127/62 (02/01 0303) Pulse Rate: 74 (02/01 0303)  Labs: Recent Labs    12/12/2019 1206 11/29/2019 1457 12/25/19 0312  HGB 10.8*  --  10.0*  HCT 34.6*  --  31.5*  PLT 175  --  174  LABPROT 49.7*  --  59.6*  INR 5.4*  --  6.9*  CREATININE 1.30*  --  1.18*  TROPONINIHS  --  46*  --     Estimated Creatinine Clearance: 26.6 mL/min (A) (by C-G formula based on SCr of 1.18 mg/dL (H)).   Medical History: Past Medical History:  Diagnosis Date  . Atrial fibrillation (HCC)   . Atrial fibrillation (HCC)   . CHF (congestive heart failure) (HCC)   . Depression   . HLD (hyperlipidemia)   . Hypertension   . Hypothyroidism     Medications:  Scheduled:  . amiodarone  100 mg Oral Daily  . dexamethasone (DECADRON) injection  6 mg Intravenous Q24H  . dorzolamide  1 drop Both Eyes TID  . escitalopram  10 mg Oral Daily  . ketoconazole  1 application Topical Daily  . levothyroxine  75 mcg Oral Daily  . liver oil-zinc oxide   Topical TID  . Melatonin  6 mg Oral QHS  . metoprolol succinate  50 mg Oral Daily  . nystatin  1 application Topical QHS  . pravastatin  80 mg Oral q1800  . sodium chloride flush  3 mL Intravenous Q12H  . sodium chloride flush  3 mL Intravenous Q12H    Assessment: 86 y/o F with a h/o atrial fibrillation on warfarin admitted with COVID-19 PNA. Pharmacy consulted for warfarin dosing. INR supra-therapeutic on admission. H/P reports scant amount of blood in stool previously but none recent. No other bleeding noted.   INR 5.4>6.9 No bleeding reported Vitamin K 5 mg iv ordered (high  bleeding risk per MD) MD declined lower oral dose or holding dose    Goal of Therapy:  INR 2-3   Plan:  -Continue to hold warfarin  -Daily INR -Monitor CBC, s/s of bleeding   Thank you for the consult.   92 D 12/25/2019,8:25 AM

## 2019-12-25 DEATH — deceased

## 2019-12-26 ENCOUNTER — Inpatient Hospital Stay (HOSPITAL_COMMUNITY): Payer: Medicare Other

## 2019-12-26 LAB — C-REACTIVE PROTEIN: CRP: 9.8 mg/dL — ABNORMAL HIGH (ref <1.0)

## 2019-12-26 LAB — COMPREHENSIVE METABOLIC PANEL
ALT: 13 U/L (ref 0–44)
AST: 22 U/L (ref 15–41)
Albumin: 3.2 g/dL — ABNORMAL LOW (ref 3.5–5.0)
Alkaline Phosphatase: 53 U/L (ref 38–126)
Anion gap: 11 (ref 5–15)
BUN: 52 mg/dL — ABNORMAL HIGH (ref 8–23)
CO2: 27 mmol/L (ref 22–32)
Calcium: 8.3 mg/dL — ABNORMAL LOW (ref 8.9–10.3)
Chloride: 106 mmol/L (ref 98–111)
Creatinine, Ser: 1.52 mg/dL — ABNORMAL HIGH (ref 0.44–1.00)
GFR calc Af Amer: 33 mL/min — ABNORMAL LOW (ref >60)
GFR calc non Af Amer: 28 mL/min — ABNORMAL LOW (ref >60)
Glucose, Bld: 172 mg/dL — ABNORMAL HIGH (ref 70–99)
Potassium: 4 mmol/L (ref 3.5–5.1)
Sodium: 144 mmol/L (ref 135–145)
Total Bilirubin: 0.8 mg/dL (ref 0.3–1.2)
Total Protein: 6.8 g/dL (ref 6.5–8.1)

## 2019-12-26 LAB — PROTIME-INR
INR: 1.5 — ABNORMAL HIGH (ref 0.8–1.2)
Prothrombin Time: 18.2 s — ABNORMAL HIGH (ref 11.4–15.2)

## 2019-12-26 LAB — BPAM FFP
Blood Product Expiration Date: 202102021700
ISSUE DATE / TIME: 202102011735
Unit Type and Rh: 6200

## 2019-12-26 LAB — PREPARE FRESH FROZEN PLASMA

## 2019-12-26 LAB — D-DIMER, QUANTITATIVE: D-Dimer, Quant: 1 ug/mL-FEU — ABNORMAL HIGH (ref 0.00–0.50)

## 2019-12-26 LAB — FERRITIN: Ferritin: 549 ng/mL — ABNORMAL HIGH (ref 11–307)

## 2019-12-26 MED ORDER — FENTANYL 2500MCG IN NS 250ML (10MCG/ML) PREMIX INFUSION
10.0000 ug/h | INTRAVENOUS | Status: DC
  Administered 2019-12-26: 10 ug/h via INTRAVENOUS
  Filled 2019-12-26: qty 250

## 2019-12-26 MED ORDER — ONDANSETRON HCL 4 MG/2ML IJ SOLN: 4.0000 mg | Freq: Four times a day (QID) | INTRAMUSCULAR | Status: DC | PRN

## 2019-12-26 MED ORDER — ACETAMINOPHEN 325 MG PO TABS: 650.0000 mg | ORAL_TABLET | Freq: Four times a day (QID) | ORAL | Status: DC | PRN

## 2019-12-26 MED ORDER — HALOPERIDOL 0.5 MG PO TABS
0.5000 mg | ORAL_TABLET | ORAL | Status: DC | PRN
  Filled 2019-12-26: qty 1

## 2019-12-26 MED ORDER — POLYVINYL ALCOHOL 1.4 % OP SOLN
1.0000 [drp] | Freq: Four times a day (QID) | OPHTHALMIC | Status: DC | PRN
  Filled 2019-12-26: qty 15

## 2019-12-26 MED ORDER — LORAZEPAM 2 MG/ML IJ SOLN
1.0000 mg | INTRAMUSCULAR | Status: DC | PRN
  Administered 2019-12-26: 11:00:00 1 mg via INTRAVENOUS
  Filled 2019-12-26: qty 1

## 2019-12-26 MED ORDER — FUROSEMIDE 10 MG/ML IJ SOLN
80.0000 mg | Freq: Once | INTRAMUSCULAR | Status: AC
  Administered 2019-12-26: 80 mg via INTRAVENOUS
  Filled 2019-12-26: qty 8

## 2019-12-26 MED ORDER — LORAZEPAM 2 MG/ML PO CONC: 1.0000 mg | ORAL | Status: DC | PRN

## 2019-12-26 MED ORDER — GLYCOPYRROLATE 0.2 MG/ML IJ SOLN: 0.2000 mg | INTRAMUSCULAR | Status: DC | PRN

## 2019-12-26 MED ORDER — BIOTENE DRY MOUTH MT LIQD: 15.0000 mL | OROMUCOSAL | Status: DC | PRN

## 2019-12-26 MED ORDER — ACETAMINOPHEN 650 MG RE SUPP: 650.0000 mg | Freq: Four times a day (QID) | RECTAL | Status: DC | PRN

## 2019-12-26 MED ORDER — HALOPERIDOL LACTATE 2 MG/ML PO CONC
0.5000 mg | ORAL | Status: DC | PRN
  Filled 2019-12-26: qty 0.3

## 2019-12-26 MED ORDER — GLYCOPYRROLATE 1 MG PO TABS
1.0000 mg | ORAL_TABLET | ORAL | Status: DC | PRN
  Filled 2019-12-26: qty 1

## 2019-12-26 MED ORDER — ONDANSETRON 4 MG PO TBDP: 4.0000 mg | ORAL_TABLET | Freq: Four times a day (QID) | ORAL | Status: DC | PRN

## 2019-12-26 MED ORDER — HALOPERIDOL LACTATE 5 MG/ML IJ SOLN: 0.5000 mg | INTRAMUSCULAR | Status: DC | PRN

## 2019-12-26 MED ORDER — LORAZEPAM 1 MG PO TABS: 1.0000 mg | ORAL_TABLET | ORAL | Status: DC | PRN

## 2019-12-29 LAB — CULTURE, BLOOD (ROUTINE X 2)
Culture: NO GROWTH
Culture: NO GROWTH
Special Requests: ADEQUATE

## 2020-01-22 NOTE — Progress Notes (Addendum)
PROGRESS NOTE    Renee Estrada  SLH:[id number] DOB: Aug 30, 1919 DOA: 12/10/2019 PCP: Angela Cox, MD    Brief Narrative:  86 year old female who presented with dyspnea.  She does have significant past medical history for heart failure, paroxysmal atrial fibrillation, hypothyroidism and dyslipidemia.  Patient lives at assisted living facility, where she tested positive for COVID-19 a few days prior to hospitalization.  Her symptoms consisted mainly in cough and diarrhea.  She was placed on 2 L of supplemental oxygen as an outpatient.  On the day of admission her oximetry was down to 60% despite supplemental oxygen and she was brought to the emergency department.  On her initial physical examination her oximetry was 90% on 6 L/min per nasal cannula, blood pressure 149/60, heart rate 86, respiratory rate 19, temperature 98.8 F, her lungs had no wheezing or rails, heart S1-S2 present rhythmic, soft abdomen, no lower extremity edema. Sodium 140, potassium 4.2, chloride 101, bicarb 26, glucose 141, BUN 21, creatinine 1.30, white count 5.4, hemoglobin 10.8, hematocrit 34.6, platelets 175, INR 5.4, SARS COVID-19 positive.  Her chest radiograph had diffuse bilateral infiltrates, with reticular nodule left upper lobe and right lower lobe, interstitial right upper lobe and left lower lobe, bilateral pleural effusions more right than left.  EKG 98 bpm, normal axis, normal intervals, sinus rhythm, ST depressions V4 to V6, no significant T wave normalities.  Patient was admitted to the hospital with working diagnosis of acute hypoxic respiratory failure due to SARS COVID-19 viral pneumonia.   Patient with rapid worsening hypoxemia, refractive to medical therapy, increase oxygen flow and diuresis. Follow up chest radiograph with worsening bilateral interstitial infiltrates. Patient with worsening fatigue and now altered mental status with somnolence and decrease responsiveness.   Patient with very poor  prognosis, I spoke with her family and focus of care will be change to comfort measures.    Assessment & Plan:   Principal Problem:   Acute respiratory disease due to COVID-19 virus Active Problems:   CHF (congestive heart failure) (HCC)   Atrial fibrillation (HCC)   Renal insufficiency   Hypothyroidism   Pleural effusion, right   Normocytic anemia   Acute hypoxemic respiratory failure due to COVID-19 (HCC)   1.  Acute hypoxic respiratory failure due to SARS COVID-19 viral pneumonia/ bilateral pleural effusions/ R>L, parapneumonic.  RR: 20  Pulse oxymetry: 92%  Fi02: 30 L/min HFNC/NRBM  COVID-19 Labs  Recent Labs    12/23/2019 1457 12/25/19 0312 01/02/20 0250  DDIMER  --  0.61* 1.00*  FERRITIN 341* 321* 549*  LDH 289*  --   --   CRP 11.8* 13.7* 9.8*    No results found for: SARSCOV2NAA  Patient with rapid and progressive worsening hypoxic respiratory failure, now she also has decreased mentation and decreased responsiveness.  Her kidney function has showed a rapid deterioration with a serum creatinine up to 1.52.  Very poor prognosis.  Her follow-up chest radiograph with bilateral pulmonary interstitial infiltrates.  Patient has been refractive to aggressive supplemental oxygen, and medical therapy with antivirals, steroids and diuretics (non cardiogenic pulmonary edema).  Patient also has received Tocilizumab and convalescent plasma.  Considering patient's condition and poor prognosis I spoke with her granddaughter over the phone and decision was made to transition to comfort care.  Patient will be placed on continuous infusion of fentanyl for respiratory distress, along with as needed lorazepam and Haldol.  Critical care time 60 minutes.  Subjective: Patient with worsening mentation, decrease responsiveness and increase work of  breathing. Positive dyspnea and distress. During night shift had increased oxygen requirements and code status was changed to DNR.    Objective: Vitals:   12/25/19 2155 12/25/19 2200 12/25/19 2215 12/25/19 2335  BP:    139/62  Pulse:    83  Resp:    20  Temp:    97.8 F (36.6 C)  TempSrc:    Oral  SpO2: (!) 84% 95% 91% 92%  Weight:      Height:        Intake/Output Summary (Last 24 hours) at 01/07/2020 0828 Last data filed at 12/25/2019 2204 Gross per 24 hour  Intake 943 ml  Output 650 ml  Net 293 ml   Filed Weights   01/22/2020 2245  Weight: 72.5 kg    Examination:   General: patient in respiratory distress, very fatigued and deconditioned.   Neurology: somnolent but easy to arouse, answers simple questions, and follows simple commands.  E ENT: positive pallor, no icterus, oral mucosa moist Cardiovascular: No JVD. S1-S2 present, rhythmic, no gallops, rubs, or murmurs. No lower extremity edema. Pulmonary: positive breath sounds bilaterally, decreased ventilation, poor inspiratory effort, bilateral rhonchi and rales. Gastrointestinal. Abdomen with no organomegaly, non tender, no rebound or guarding Skin. No rashes Musculoskeletal: no joint deformities     Data Reviewed: I have personally reviewed following labs and imaging studies  CBC: Recent Labs  Lab 2020-01-22 1206 12/25/19 0312  WBC 5.4 3.2*  NEUTROABS 4.2 2.4  HGB 10.8* 10.0*  HCT 34.6* 31.5*  MCV 97.2 97.2  PLT 175 124   Basic Metabolic Panel: Recent Labs  Lab 01-22-2020 1206 12/25/19 0312 12/25/2019 0250  NA 140 140 144  K 4.2 3.8 4.0  CL 101 105 106  CO2 26 25 27   GLUCOSE 141* 153* 172*  BUN 21 26* 52*  CREATININE 1.30* 1.18* 1.52*  CALCIUM 9.0 8.2* 8.3*  MG  --  2.1  --    GFR: Estimated Creatinine Clearance: 20.6 mL/min (A) (by C-G formula based on SCr of 1.52 mg/dL (H)). Liver Function Tests: Recent Labs  Lab 01-22-20 1206 12/25/19 0312 01/13/2020 0250  AST 31 20 22   ALT 15 12 13   ALKPHOS 60 49 53  BILITOT 1.0 0.8 0.8  PROT 7.5 6.6 6.8  ALBUMIN 3.7 3.1* 3.2*   No results for input(s): LIPASE, AMYLASE in the last  168 hours. No results for input(s): AMMONIA in the last 168 hours. Coagulation Profile: Recent Labs  Lab Jan 22, 2020 1206 12/25/19 0312 12/25/2019 0250  INR 5.4* 6.9* 1.5*   Cardiac Enzymes: No results for input(s): CKTOTAL, CKMB, CKMBINDEX, TROPONINI in the last 168 hours. BNP (last 3 results) No results for input(s): PROBNP in the last 8760 hours. HbA1C: No results for input(s): HGBA1C in the last 72 hours. CBG: No results for input(s): GLUCAP in the last 168 hours. Lipid Profile: No results for input(s): CHOL, HDL, LDLCALC, TRIG, CHOLHDL, LDLDIRECT in the last 72 hours. Thyroid Function Tests: No results for input(s): TSH, T4TOTAL, FREET4, T3FREE, THYROIDAB in the last 72 hours. Anemia Panel: Recent Labs    12/25/19 0312 12/29/2019 0250  FERRITIN 321* 549*      Radiology Studies: I have reviewed all of the imaging during this hospital visit personally     Scheduled Meds: . amiodarone  100 mg Oral Daily  . chlorpheniramine-HYDROcodone  5 mL Oral Q12H  . dexamethasone (DECADRON) injection  6 mg Intravenous Q24H  . dorzolamide  1 drop Both Eyes TID  . escitalopram  10  mg Oral Daily  . furosemide  60 mg Oral Daily  . ketoconazole  1 application Topical Daily  . levothyroxine  75 mcg Oral Daily  . liver oil-zinc oxide   Topical TID  . mouth rinse  15 mL Mouth Rinse BID  . Melatonin  6 mg Oral QHS  . metoprolol succinate  50 mg Oral Daily  . nystatin  1 application Topical QHS  . pravastatin  80 mg Oral q1800  . sodium chloride flush  3 mL Intravenous Q12H  . sodium chloride flush  3 mL Intravenous Q12H   Continuous Infusions: . sodium chloride    . remdesivir 100 mg in NS 100 mL Stopped (12/25/19 0951)     LOS: 2 days        Normagene Harvie Annett Gula, MD

## 2020-01-22 NOTE — Progress Notes (Signed)
Pt stable with no signs of distress. Report given to oncoming nurse. Safety maintained.  

## 2020-01-22 NOTE — Progress Notes (Signed)
   01/15/20 6438  Family/Significant Other Communication  Family/Significant Other Update Called;Updated (Granddaughter Delice Bison)

## 2020-01-22 NOTE — Plan of Care (Signed)
  Problem: Education: Goal: Knowledge of risk factors and measures for prevention of condition will improve Outcome: Progressing   Problem: Coping: Goal: Psychosocial and spiritual needs will be supported Outcome: Progressing   Problem: Respiratory: Goal: Will maintain a patent airway Outcome: Progressing Goal: Complications related to the disease process, condition or treatment will be avoided or minimized Outcome: Progressing   

## 2020-01-22 NOTE — Progress Notes (Signed)
Belongings labeled and placed in bag with patient:  Purse Check book $7 in cash Glasses Dentures (upper and lower) Watch Cell phone

## 2020-01-22 NOTE — Progress Notes (Signed)
   01/21/2020 0300  Family/Significant Other Communication  Family/Significant Other Update Called;Updated  Granddaughter Irving Burton [phone] brother Deniece Portela

## 2020-01-22 NOTE — Progress Notes (Signed)
TOD 1139, family and MD made aware of passing. Family deciding on funeral home, will call back to obtain information.

## 2020-01-22 NOTE — Death Summary Note (Signed)
Death Summary  Renee Estrada PFX:[id number] DOB: 1919-05-20 DOA: 01/16/20  PCP: Merlene Laughter, MD  Admit date: January 16, 2020 Date of Death: 18-Jan-2020 Time of Death: 11:39  Notification: Merlene Laughter, MD notified of death of January 18, 2020   History of present illness:  Renee Estrada is a 86 y.o. female with a history of heart failure (diatolic), paroxysmalatrial fibrillation, hypothyroidism and dyslipidemia.    Renee Estrada presented with complaint of dyspnea.  Renee Estrada did not improve after aggressive medical therapy with supplemental oxygen, antivirals, systemic steroids, Tocilizumab and convalescent plasma.  Patient lived at assisted living facility, where she tested positive for COVID-19 a few days prior to hospitalization. Her symptoms consisted mainly in cough and diarrhea. She was placed on 2 L of supplemental oxygen as an outpatient.On the day of admission her oximetry was down to 60% despite supplemental oxygen and she was brought to the emergency department. On her initial physical examination her oximetry was 90% on 6 L/min per nasal cannula, blood pressure 149/60, heart rate 86, respiratory rate 19, temperature 98.8 F, her lungs had no wheezing or rales, heart S1-S2 present rhythmic, soft abdomen, no lower extremity edema. Sodium 140, potassium 4.2, chloride 101, bicarb 26, glucose 141, BUN 21, creatinine 1.30, white count 5.4, hemoglobin 10.8, hematocrit 34.6, platelets 175, INR 5.4,SARS COVID-19 positive.Her chest radiograph had diffuse bilateral infiltrates, with reticular nodular left upper lobe and right lower lobe, interstitial right upper lobe and left lower lobe, bilateral pleural effusions more right than left. EKG 98 bpm, normal axis, normal intervals, sinus rhythm, ST depressions V4 to V6, no significant T wave normalities.  Patient was admitted to the hospital with working diagnosis of acute hypoxic respiratory failure due to SARS COVID-19 viral  pneumonia.   Patient with rapid worsening hypoxemia, refractive to medical therapy, increase oxygen flow and diuresis. Follow up chest radiograph with worsening bilateral interstitial infiltrates. Patient with worsening fatigue and altered mental status with somnolence and decrease responsiveness.   Patient with very poor prognosis, I spoke with her family and focus of care will be change to comfort measures.    Final Diagnoses:   1.Acute hypoxic respiratory failure due to SARS COVID-19 viral pneumonia/ bilateral pleural effusions/ R>L, parapneumonic. 2. Paroxysmal atrial fibrillation with supra-therapeutic INR. Marland Kitchen  3. Chronic diastolic heart failure.  4. AKI.5. Hypothyroid. 6. Dyslipidemia.   The results of significant diagnostics from this hospitalization (including imaging, microbiology, ancillary and laboratory) are listed below for reference.    Significant Diagnostic Studies: DG Chest 1 View  Result Date: January 18, 2020 CLINICAL DATA:  Dyspnea history of COVID-19 infection EXAM: CHEST  1 VIEW COMPARISON:  16-Jan-2020 FINDINGS: Interval worsening of interstitial component of interstitial and alveolar process seen on prior exam. The the cardiomediastinal contours are largely obscured. Film is rotated to the right. Increasing opacity at the right lung base likely associated with pleural effusion and perhaps fluid in the peripheral aspect of the fissure versus worsening consolidation in the right mid chest. Signs of vascular disease. No acute bone process. IMPRESSION: 1. Interval worsening of interstitial and alveolar process seen on prior exam. Findings likely related to known COVID-19 infection. Also correlate with any evidence of heart failure. 2. Increasing consolidation likely associated with pleural fluid particularly on the right. 3. Worsening airspace disease more focal in the right mid chest versus fluid in the fissure. Electronically Signed   By: Zetta Bills M.D.   On: 01/18/2020  09:17   DG Chest Portable 1 View  Result Date: January 16, 2020 CLINICAL  DATA:  Cough, weakness, COVID-19 positive EXAM: PORTABLE CHEST 1 VIEW COMPARISON:  None. FINDINGS: Heart size is within normal limits. Moderate right pleural effusion. Diffuse interstitial and alveolar airspace opacities throughout both lungs. No pneumothorax. IMPRESSION: 1. Diffuse interstitial and alveolar airspace opacities throughout both lungs suggesting multifocal atypical/viral pneumonia in the setting of COVID-19 infection. 2. Moderate right pleural effusion. Electronically Signed   By: Duanne Guess D.O.   On: 2020/01/15 12:19    Microbiology: Recent Results (from the past 240 hour(s))  Culture, blood (Routine x 2)     Status: None (Preliminary result)   Collection Time: 01/15/2020 12:06 PM   Specimen: BLOOD  Result Value Ref Range Status   Specimen Description BLOOD BLOOD LEFT WRIST  Final   Special Requests   Final    BOTTLES DRAWN AEROBIC AND ANAEROBIC Blood Culture results may not be optimal due to an inadequate volume of blood received in culture bottles   Culture   Final    NO GROWTH 2 DAYS Performed at [facility], [address], [place], [place] [address]    Report Status PENDING  Incomplete  Culture, blood (Routine x 2)     Status: None (Preliminary result)   Collection Time: 01/15/20 12:06 PM   Specimen: BLOOD  Result Value Ref Range Status   Specimen Description BLOOD BLOOD LEFT FOREARM  Final   Special Requests   Final    BOTTLES DRAWN AEROBIC AND ANAEROBIC Blood Culture adequate volume   Culture   Final    NO GROWTH 2 DAYS Performed at [facility], [address] [place], [place] [address]    Report Status PENDING  Incomplete  MRSA PCR Screening     Status: Abnormal   Collection Time: Jan 15, 2020 10:57 PM   Specimen: Nasopharyngeal  Result Value Ref Range Status   MRSA by PCR POSITIVE (A) NEGATIVE Final    Comment:        The GeneXpert MRSA Assay (FDA approved for  NASAL specimens only), is one component of a comprehensive MRSA colonization surveillance program. It is not intended to diagnose MRSA infection nor to guide or monitor treatment for MRSA infections. RESULT CALLED TO, READ BACK BY AND VERIFIED WITH: GUERRIER,P @ 2354 ON [id number] BY POTEAT,S Performed at [facility], [address], [place], [place] [address]      Labs: Basic Metabolic Panel: Recent Labs  Lab 01/15/20 1206 01/15/20 1206 12/25/19 0312 12/31/2019 0250  NA 140  --  140 144  K 4.2   < > 3.8 4.0  CL 101  --  105 106  CO2 26  --  25 27  GLUCOSE 141*  --  153* 172*  BUN 21  --  26* 52*  CREATININE 1.30*  --  1.18* 1.52*  CALCIUM 9.0  --  8.2* 8.3*  MG  --   --  2.1  --    < > = values in this interval not displayed.   Liver Function Tests: Recent Labs  Lab 2020/01/15 1206 12/25/19 0312 01/11/2020 0250  AST 31 20 22   ALT 15 12 13   ALKPHOS 60 49 53  BILITOT 1.0 0.8 0.8  PROT 7.5 6.6 6.8  ALBUMIN 3.7 3.1* 3.2*   No results for input(s): LIPASE, AMYLASE in the last 168 hours. No results for input(s): AMMONIA in the last 168 hours. CBC: Recent Labs  Lab January 15, 2020 1206 12/25/19 0312  WBC 5.4 3.2*  NEUTROABS 4.2 2.4  HGB 10.8* 10.0*  HCT 34.6* 31.5*  MCV 97.2 97.2  PLT 175 174   Cardiac Enzymes: No results for input(s): CKTOTAL, CKMB, CKMBINDEX, TROPONINI in the last 168 hours. D-Dimer Recent Labs    12/25/19 0312 01/10/2020 0250  DDIMER 0.61* 1.00*   BNP: Invalid input(s): POCBNP CBG: No results for input(s): GLUCAP in the last 168 hours. Anemia work up [employer]    12/25/19 0312 01/10/2020 0250  FERRITIN 321* 549*   Urinalysis No results found for: COLORURINE, APPEARANCEUR, LABSPEC, PHURINE, GLUCOSEU, HGBUR, BILIRUBINUR, KETONESUR, PROTEINUR, UROBILINOGEN, NITRITE, LEUKOCYTESUR Sepsis Labs Invalid input(s): PROCALCITONIN,  WBC,  LACTICIDVEN     SIGNED:  Coralie Keens, MD  [facility] 01/17/2020,  12:05 PM Pager   If 7PM-7AM, please contact night-coverage [email or web address] Password [email or web address]

## 2020-01-22 NOTE — Progress Notes (Signed)
Patient to go to [facility] [address] [phone]

## 2020-01-22 DEATH — deceased

## 2021-06-27 IMAGING — DX DG CHEST 1V
1 series · 1 of 1 positions shown · non-contrast
Comparison: 12/24/2019

CLINICAL DATA: Dyspnea history of 4BTBL-PI infection

EXAM:
CHEST  1 VIEW

[chest]
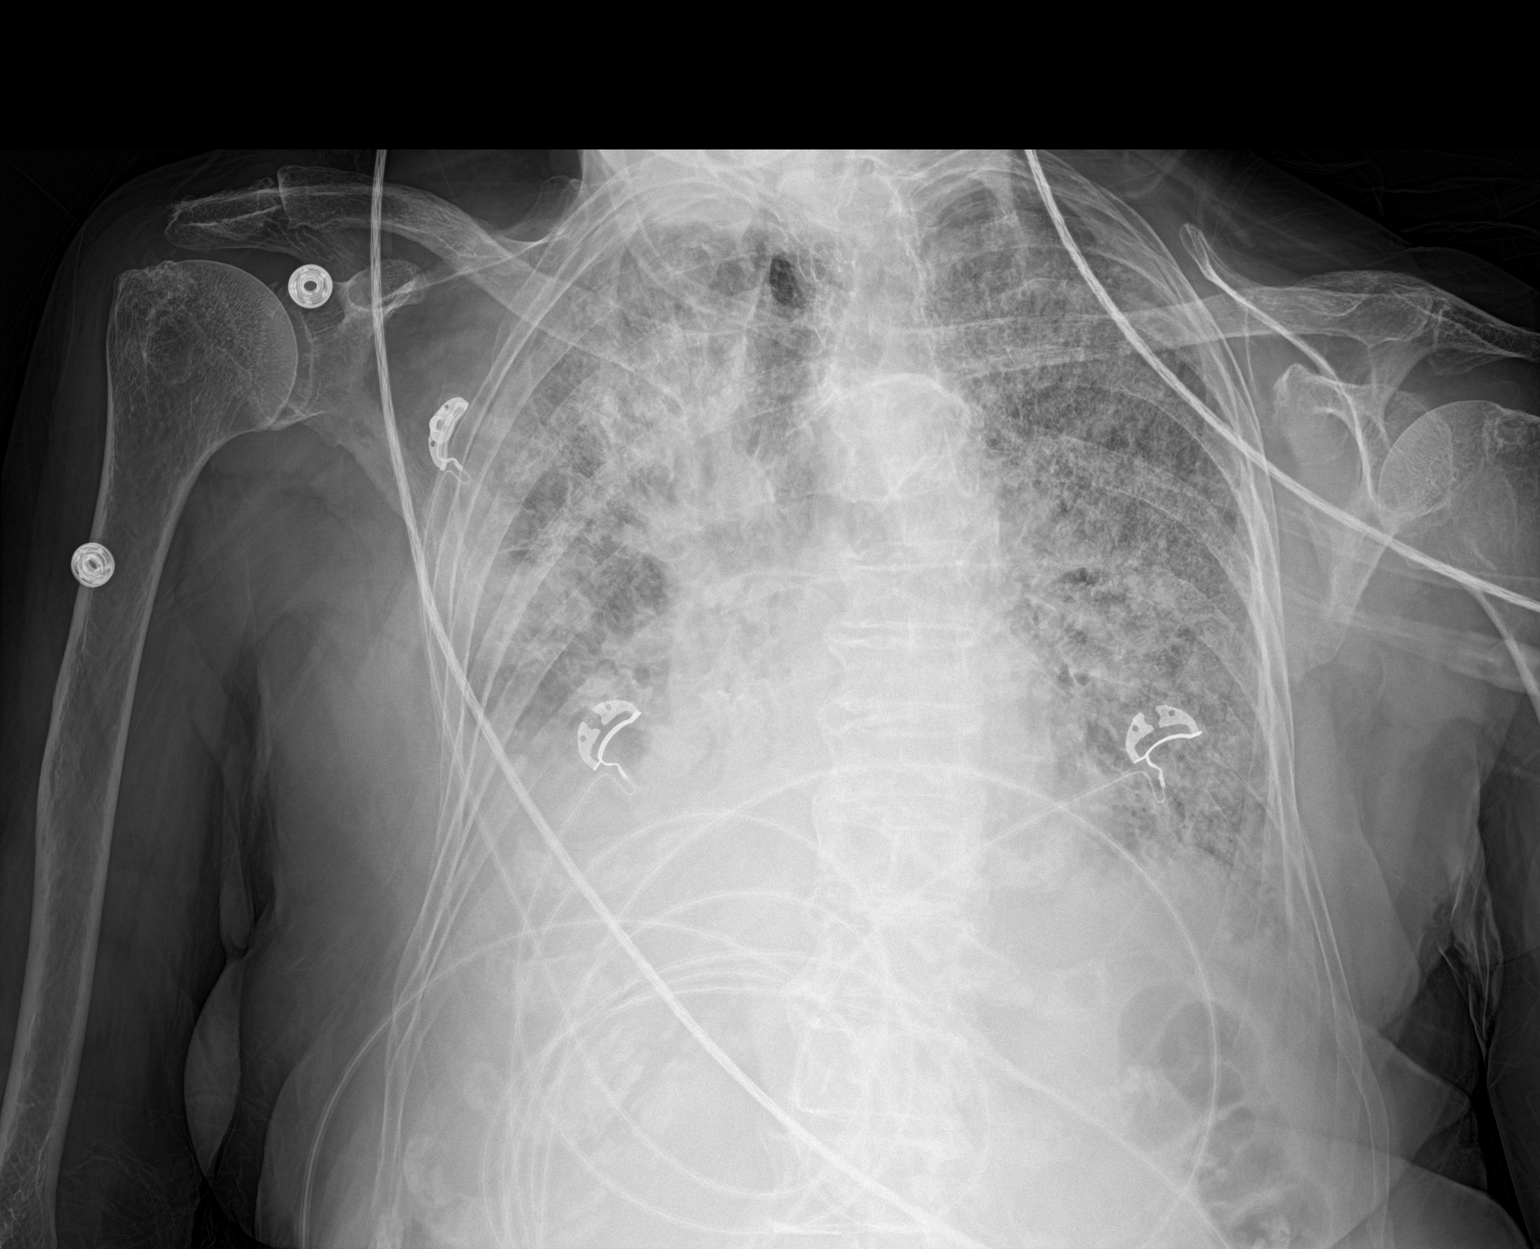

[1 of 1 positions shown; findings below may reference images not displayed]

FINDINGS: Interval worsening of interstitial component of interstitial and
alveolar process seen on prior exam. The the cardiomediastinal
contours are largely obscured. Film is rotated to the right.

Increasing opacity at the right lung base likely associated with
pleural effusion and perhaps fluid in the peripheral aspect of the
fissure versus worsening consolidation in the right mid chest.

Signs of vascular disease.

No acute bone process.
IMPRESSION: 1. Interval worsening of interstitial and alveolar process seen on
prior exam. Findings likely related to known 4BTBL-PI infection.
Also correlate with any evidence of heart failure.
2. Increasing consolidation likely associated with pleural fluid
particularly on the right.
3. Worsening airspace disease more focal in the right mid chest
versus fluid in the fissure.
